# Patient Record
Sex: Male | Born: 1955
Health system: Southern US, Community
[De-identification: ages and names within clinical notes are randomized; demographics above are authoritative.]

## PROBLEM LIST (undated history)

## (undated) DIAGNOSIS — E785 Hyperlipidemia, unspecified: Secondary | ICD-10-CM

## (undated) DIAGNOSIS — F32A Depression, unspecified: Secondary | ICD-10-CM

## (undated) DIAGNOSIS — I10 Essential (primary) hypertension: Secondary | ICD-10-CM

## (undated) DIAGNOSIS — F419 Anxiety disorder, unspecified: Secondary | ICD-10-CM

## (undated) DIAGNOSIS — I714 Abdominal aortic aneurysm, without rupture, unspecified: Secondary | ICD-10-CM

## (undated) DIAGNOSIS — I491 Atrial premature depolarization: Secondary | ICD-10-CM

## (undated) DIAGNOSIS — I639 Cerebral infarction, unspecified: Secondary | ICD-10-CM

## (undated) DIAGNOSIS — R3911 Hesitancy of micturition: Secondary | ICD-10-CM

## (undated) DIAGNOSIS — I700 Atherosclerosis of aorta: Secondary | ICD-10-CM

## (undated) DIAGNOSIS — F329 Major depressive disorder, single episode, unspecified: Secondary | ICD-10-CM

## (undated) DIAGNOSIS — R0600 Dyspnea, unspecified: Secondary | ICD-10-CM

## (undated) DIAGNOSIS — R7309 Other abnormal glucose: Secondary | ICD-10-CM

## (undated) HISTORY — DX: Hyperlipidemia, unspecified: E78.5

## (undated) HISTORY — DX: Other abnormal glucose: R73.09

## (undated) HISTORY — DX: Atherosclerosis of aorta: I70.0

## (undated) HISTORY — DX: Major depressive disorder, single episode, unspecified: F32.9

## (undated) HISTORY — DX: Atrial premature depolarization: I49.1

## (undated) HISTORY — DX: Anxiety disorder, unspecified: F41.9

## (undated) HISTORY — DX: Abdominal aortic aneurysm, without rupture, unspecified: I71.40

## (undated) HISTORY — DX: Depression, unspecified: F32.A

## (undated) HISTORY — DX: Cerebral infarction, unspecified: I63.9

## (undated) HISTORY — DX: Hesitancy of micturition: R39.11

## (undated) HISTORY — DX: Essential (primary) hypertension: I10

## (undated) HISTORY — PX: COLON RESECTION: SHX5231

---

## 2010-01-12 ENCOUNTER — Inpatient Hospital Stay (HOSPITAL_COMMUNITY): Admission: EM | Admit: 2010-01-12 | Discharge: 2010-01-16 | Payer: Self-pay | Admitting: Emergency Medicine

## 2010-07-02 HISTORY — PX: COLON RESECTION: SHX5231

## 2010-07-05 ENCOUNTER — Emergency Department (HOSPITAL_COMMUNITY)
Admission: EM | Admit: 2010-07-05 | Discharge: 2010-07-05 | Payer: Self-pay | Source: Home / Self Care | Admitting: Emergency Medicine

## 2010-07-05 LAB — CBC
HCT: 43.4 % (ref 39.0–52.0)
Hemoglobin: 14.5 g/dL (ref 13.0–17.0)
MCH: 29.7 pg (ref 26.0–34.0)
MCHC: 33.4 g/dL (ref 30.0–36.0)
MCV: 88.9 fL (ref 78.0–100.0)
Platelets: 333 10*3/uL (ref 150–400)
RBC: 4.88 MIL/uL (ref 4.22–5.81)
RDW: 14.2 % (ref 11.5–15.5)
WBC: 9.1 10*3/uL (ref 4.0–10.5)

## 2010-07-05 LAB — COMPREHENSIVE METABOLIC PANEL
ALT: 16 U/L (ref 0–53)
AST: 17 U/L (ref 0–37)
Albumin: 3.8 g/dL (ref 3.5–5.2)
Alkaline Phosphatase: 79 U/L (ref 39–117)
BUN: 8 mg/dL (ref 6–23)
CO2: 24 mEq/L (ref 19–32)
Calcium: 9.6 mg/dL (ref 8.4–10.5)
Chloride: 105 mEq/L (ref 96–112)
Creatinine, Ser: 0.94 mg/dL (ref 0.4–1.5)
GFR calc Af Amer: >60 mL/min (ref >60)
GFR calc non Af Amer: >60 mL/min (ref >60)
Glucose, Bld: 98 mg/dL (ref 70–99)
Potassium: 4.2 mEq/L (ref 3.5–5.1)
Sodium: 139 mEq/L (ref 135–145)
Total Bilirubin: 0.9 mg/dL (ref 0.3–1.2)
Total Protein: 7.2 g/dL (ref 6.0–8.3)

## 2010-07-05 LAB — DIFFERENTIAL
Basophils Absolute: 0 10*3/uL (ref 0.0–0.1)
Basophils Relative: 0 % (ref 0–1)
Eosinophils Absolute: 0.1 10*3/uL (ref 0.0–0.7)
Eosinophils Relative: 1 % (ref 0–5)
Lymphocytes Relative: 18 % (ref 12–46)
Lymphs Abs: 1.6 10*3/uL (ref 0.7–4.0)
Monocytes Absolute: 0.4 10*3/uL (ref 0.1–1.0)
Monocytes Relative: 4 % (ref 3–12)
Neutro Abs: 7 10*3/uL (ref 1.7–7.7)
Neutrophils Relative %: 77 % (ref 43–77)

## 2010-07-05 LAB — URINALYSIS, ROUTINE W REFLEX MICROSCOPIC
Hemoglobin, Urine: NEGATIVE
Ketones, ur: 15 mg/dL — AB
Nitrite: NEGATIVE
Protein, ur: NEGATIVE mg/dL
Specific Gravity, Urine: 1.023 (ref 1.005–1.030)
Urine Glucose, Fasting: NEGATIVE mg/dL
Urobilinogen, UA: 0.2 mg/dL (ref 0.0–1.0)
pH: 5.5 (ref 5.0–8.0)

## 2010-07-06 ENCOUNTER — Inpatient Hospital Stay (HOSPITAL_COMMUNITY): Admission: AD | Admit: 2010-07-06 | Discharge: 2010-07-16 | Payer: Self-pay | Source: Home / Self Care

## 2010-07-07 LAB — BASIC METABOLIC PANEL
BUN: 9 mg/dL (ref 6–23)
CO2: 25 mEq/L (ref 19–32)
Calcium: 9 mg/dL (ref 8.4–10.5)
Chloride: 105 mEq/L (ref 96–112)
Creatinine, Ser: 1 mg/dL (ref 0.4–1.5)
GFR calc Af Amer: >60 mL/min (ref >60)
GFR calc non Af Amer: >60 mL/min (ref >60)
Glucose, Bld: 121 mg/dL — ABNORMAL HIGH (ref 70–99)
Potassium: 3.8 mEq/L (ref 3.5–5.1)
Sodium: 137 mEq/L (ref 135–145)

## 2010-07-07 LAB — CBC
HCT: 37.5 % — ABNORMAL LOW (ref 39.0–52.0)
Hemoglobin: 12.4 g/dL — ABNORMAL LOW (ref 13.0–17.0)
MCH: 29.5 pg (ref 26.0–34.0)
MCHC: 33.1 g/dL (ref 30.0–36.0)
MCV: 89.3 fL (ref 78.0–100.0)
Platelets: 310 10*3/uL (ref 150–400)
RBC: 4.2 MIL/uL — ABNORMAL LOW (ref 4.22–5.81)
RDW: 14.3 % (ref 11.5–15.5)
WBC: 12.1 10*3/uL — ABNORMAL HIGH (ref 4.0–10.5)

## 2010-07-17 LAB — BASIC METABOLIC PANEL
BUN: 4 mg/dL — ABNORMAL LOW (ref 6–23)
CO2: 24 mEq/L (ref 19–32)
Calcium: 9.2 mg/dL (ref 8.4–10.5)
Chloride: 105 mEq/L (ref 96–112)
Creatinine, Ser: 0.88 mg/dL (ref 0.4–1.5)
GFR calc Af Amer: >60 mL/min (ref >60)
GFR calc non Af Amer: >60 mL/min (ref >60)
Glucose, Bld: 112 mg/dL — ABNORMAL HIGH (ref 70–99)
Potassium: 4.1 mEq/L (ref 3.5–5.1)
Sodium: 139 mEq/L (ref 135–145)

## 2010-07-17 LAB — CBC
HCT: 39.4 % (ref 39.0–52.0)
HCT: 35.4 % — ABNORMAL LOW (ref 39.0–52.0)
HCT: 39.3 % (ref 39.0–52.0)
Hemoglobin: 12.9 g/dL — ABNORMAL LOW (ref 13.0–17.0)
Hemoglobin: 11.6 g/dL — ABNORMAL LOW (ref 13.0–17.0)
Hemoglobin: 13 g/dL (ref 13.0–17.0)
MCH: 29.1 pg (ref 26.0–34.0)
MCH: 29.4 pg (ref 26.0–34.0)
MCH: 29.3 pg (ref 26.0–34.0)
MCHC: 32.7 g/dL (ref 30.0–36.0)
MCHC: 32.8 g/dL (ref 30.0–36.0)
MCHC: 33.1 g/dL (ref 30.0–36.0)
MCV: 88.9 fL (ref 78.0–100.0)
MCV: 89.8 fL (ref 78.0–100.0)
MCV: 88.5 fL (ref 78.0–100.0)
Platelets: 337 10*3/uL (ref 150–400)
Platelets: 326 10*3/uL (ref 150–400)
Platelets: 373 10*3/uL (ref 150–400)
RBC: 4.43 MIL/uL (ref 4.22–5.81)
RBC: 3.94 MIL/uL — ABNORMAL LOW (ref 4.22–5.81)
RBC: 4.44 MIL/uL (ref 4.22–5.81)
RDW: 14.3 % (ref 11.5–15.5)
RDW: 14.3 % (ref 11.5–15.5)
RDW: 14.3 % (ref 11.5–15.5)
WBC: 12.8 10*3/uL — ABNORMAL HIGH (ref 4.0–10.5)
WBC: 8.4 10*3/uL (ref 4.0–10.5)
WBC: 8.7 10*3/uL (ref 4.0–10.5)

## 2010-07-19 LAB — BODY FLUID CULTURE
Culture: NO GROWTH
Gram Stain: NONE SEEN

## 2010-07-24 LAB — ANAEROBIC CULTURE: Gram Stain: NONE SEEN

## 2010-08-23 ENCOUNTER — Ambulatory Visit (HOSPITAL_COMMUNITY)
Admission: RE | Admit: 2010-08-23 | Discharge: 2010-08-23 | Disposition: A | Discharge status: Home / Self Care | Payer: BC Managed Care – PPO | Source: Ambulatory Visit | Attending: Surgery | Admitting: Surgery

## 2010-08-23 ENCOUNTER — Encounter (HOSPITAL_COMMUNITY)
Admission: RE | Admit: 2010-08-23 | Discharge: 2010-08-23 | Disposition: A | Discharge status: Home / Self Care | Payer: BC Managed Care – PPO | Source: Ambulatory Visit | Attending: Surgery | Admitting: Surgery

## 2010-08-23 ENCOUNTER — Other Ambulatory Visit (HOSPITAL_COMMUNITY): Payer: Self-pay | Admitting: Surgery

## 2010-08-23 DIAGNOSIS — Z01810 Encounter for preprocedural cardiovascular examination: Secondary | ICD-10-CM | POA: Insufficient documentation

## 2010-08-23 DIAGNOSIS — K5730 Diverticulosis of large intestine without perforation or abscess without bleeding: Secondary | ICD-10-CM | POA: Insufficient documentation

## 2010-08-23 DIAGNOSIS — Z01812 Encounter for preprocedural laboratory examination: Secondary | ICD-10-CM | POA: Insufficient documentation

## 2010-08-23 DIAGNOSIS — Z01818 Encounter for other preprocedural examination: Secondary | ICD-10-CM | POA: Insufficient documentation

## 2010-08-23 LAB — SURGICAL PCR SCREEN
MRSA, PCR: NEGATIVE
Staphylococcus aureus: NEGATIVE

## 2010-08-23 LAB — COMPREHENSIVE METABOLIC PANEL
ALT: 26 U/L (ref 0–53)
AST: 21 U/L (ref 0–37)
Albumin: 3.8 g/dL (ref 3.5–5.2)
Alkaline Phosphatase: 67 U/L (ref 39–117)
BUN: 12 mg/dL (ref 6–23)
CO2: 24 mEq/L (ref 19–32)
Calcium: 9.7 mg/dL (ref 8.4–10.5)
Chloride: 108 mEq/L (ref 96–112)
Creatinine, Ser: 0.88 mg/dL (ref 0.4–1.5)
GFR calc Af Amer: >60 mL/min (ref >60)
GFR calc non Af Amer: >60 mL/min (ref >60)
Glucose, Bld: 87 mg/dL (ref 70–99)
Potassium: 4.5 mEq/L (ref 3.5–5.1)
Sodium: 140 mEq/L (ref 135–145)
Total Bilirubin: 0.6 mg/dL (ref 0.3–1.2)
Total Protein: 6.4 g/dL (ref 6.0–8.3)

## 2010-08-23 LAB — CBC
HCT: 42 % (ref 39.0–52.0)
Hemoglobin: 14.1 g/dL (ref 13.0–17.0)
MCH: 29.6 pg (ref 26.0–34.0)
MCHC: 33.6 g/dL (ref 30.0–36.0)
MCV: 88.2 fL (ref 78.0–100.0)
Platelets: 285 10*3/uL (ref 150–400)
RBC: 4.76 MIL/uL (ref 4.22–5.81)
RDW: 14.8 % (ref 11.5–15.5)
WBC: 9.1 10*3/uL (ref 4.0–10.5)

## 2010-08-23 LAB — DIFFERENTIAL
Basophils Absolute: 0.1 10*3/uL (ref 0.0–0.1)
Basophils Relative: 1 % (ref 0–1)
Eosinophils Absolute: 0.1 10*3/uL (ref 0.0–0.7)
Eosinophils Relative: 1 % (ref 0–5)
Lymphocytes Relative: 24 % (ref 12–46)
Lymphs Abs: 2.2 10*3/uL (ref 0.7–4.0)
Monocytes Absolute: 0.5 10*3/uL (ref 0.1–1.0)
Monocytes Relative: 5 % (ref 3–12)
Neutro Abs: 6.3 10*3/uL (ref 1.7–7.7)
Neutrophils Relative %: 69 % (ref 43–77)

## 2010-08-28 ENCOUNTER — Inpatient Hospital Stay (HOSPITAL_COMMUNITY)
Admission: RE | Admit: 2010-08-28 | Discharge: 2010-09-01 | DRG: 148 | Disposition: A | Discharge status: Home / Self Care | Payer: BC Managed Care – PPO | Source: Ambulatory Visit | Attending: Surgery | Admitting: Surgery

## 2010-08-28 ENCOUNTER — Other Ambulatory Visit: Payer: Self-pay | Admitting: Surgery

## 2010-08-28 DIAGNOSIS — K5732 Diverticulitis of large intestine without perforation or abscess without bleeding: Principal | ICD-10-CM | POA: Diagnosis present

## 2010-08-28 DIAGNOSIS — R51 Headache: Secondary | ICD-10-CM | POA: Diagnosis not present

## 2010-08-28 DIAGNOSIS — I10 Essential (primary) hypertension: Secondary | ICD-10-CM | POA: Diagnosis present

## 2010-08-28 DIAGNOSIS — J029 Acute pharyngitis, unspecified: Secondary | ICD-10-CM | POA: Diagnosis not present

## 2010-08-28 DIAGNOSIS — K560 Paralytic ileus: Secondary | ICD-10-CM | POA: Diagnosis not present

## 2010-08-28 DIAGNOSIS — H9190 Unspecified hearing loss, unspecified ear: Secondary | ICD-10-CM | POA: Diagnosis present

## 2010-08-28 DIAGNOSIS — F17200 Nicotine dependence, unspecified, uncomplicated: Secondary | ICD-10-CM | POA: Diagnosis present

## 2010-08-28 NOTE — Discharge Summary (Signed)
Lawrence Bennett, Lawrence Bennett              ACCOUNT NO.:  [id number]  MEDICAL RECORD NO.:  [id number]          PATIENT TYPE:  INP  LOCATION:  1524                         FACILITY:  [facility]  PHYSICIAN:  Sharlet Salina T. Maybelline Kolarik, M.D.DATE OF BIRTH:  1956-03-19  DATE OF ADMISSION:  07/06/2010 DATE OF DISCHARGE:  07/16/2010                              DISCHARGE SUMMARY   HISTORY OF PRESENT ILLNESS:  Mr. Carll is a 55 year old male who presents to emergency department at [facility] for evidence of recurrent sigmoid diverticulitis.  He previously had been seen by Dr. Ezzard Standing and had been admitted before for diverticulitis and treated with IV antibiotics.  He had an outpatient colonoscopy during his outpatient workup, which proved significant diverticular disease particularly of the left colon.  He had previously been on Cipro and Flagyl.  He complained of recurrent abdominal pain and was asked to come back to the emergency department for further evaluation.  His reevaluation in the emergency department prompted a CT which showed significant recurrent sigmoid diverticulitis with an adjacent loop of small bowel that appeared to be a little bit thickened possibly indicating some associated inflammatory changes.  There did not appear to be any abscess or perforation at present time.  The patient was seen by Dr. Zachery Dakins as well as Dr. Ezzard Standing and decision was made to admit the patient for inpatient management and possible surgical involvement.  SUMMARY OF HOSPITAL COURSE:  The patient was admitted on July 06, 2010.  He was admitted to the floor and placed on IV antibiotics and essentially placed at bowel rest.  The antibiotics were continued.  The patient did require Foley catheterization for some urinary retention secondary to the associated inflammatory changes.  He did make some progress at first showing evidence of bowel function by means of a large bowel movement.  He was started on sips of  liquids.  However, the following day had a significant amount of bilious emesis.  This was possibly thought to be due to some terazosin that the patient was placed on for the urinary retention and this was subsequently discontinued.  In any event, the patient's diet was backed off to sips of liquids.  Later that day, the patient did have approximately 4 bowel movements but again started having trouble voiding since he was off his terazosin. Therefore, decision was made to put the patient on Flomax to try to treat his urinary retention.  His ileus began to resolve and since he had no longer any nausea or vomiting, his diet was advanced from clear liquids to full liquids.  Eventually, the patient was advanced to a low- residue diet.  However, the patient had new onset of abdominal pain, particularly on the left side feeling of some fullness.  This prompted a repeat CT since the patient was just not making the progress we anticipated he would.  CT again showed sigmoid diverticulitis, however, this time appeared to show fluid collection in the deep pelvis possibly indicating development of an abscess.  Decision was made to have Interventional Radiology aspirator possibly leave a drain in this fluid collection.  On July 14, 2010, the patient was  taken for CT-guided aspiration of pelvic fluid collection.  Approximately, 45 cc were removed of some clear yellow serous fluid.  No purulence was seen and therefore drain was not left in place.  The patient felt significantly better within the first 24 hours after having the fluid collection drained.  His diet was again advanced back up to a low-residue diet and again he felt significantly better enough to be discharged home.  The patient felt this was appropriate and was eager to get back home.  DISCHARGE DIAGNOSES: 1. Recurrent sigmoid diverticulitis with associated inflammatory     changes without perforation. 2. Ileus secondary to recurrent  sigmoid diverticulitis - resolved. 3. Urinary retention - resolved on Flomax.  DISCHARGE MEDICATIONS:  Will include: 1. Cipro 500 mg twice daily. 2. Flagyl 500 mg 3 times daily. 3. Ibuprofen 200 mg 2-3 tablets q.8 h. as needed p.r.n. pain. 4. Percocet 1 tablet q. 4-6 hours as needed for severe pain. 5. Flomax 0.4 mg daily. 6. Quinapril 40 mg daily.  The patient is set up to follow up with Dr. Ezzard Standing in approximately 1-2 weeks.  He is to follow a low-residue diet.  The dietitian visited the patient prior to discharge and gave him instructions and recommendations for dietary options.  He can certainly call any sooner for any issues if he has any.     Brayton El, PA-C   ______________________________ Lorne Skeens. Kohlton Gilpatrick, M.D.    Corky Downs  D:  08/02/2010  T:  08/02/2010  Job:  [id number]  Electronically Signed by Brayton El  on 08/07/2010 01:33:19 PM Electronically Signed by Glenna Fellows M.D. on 08/28/2010 03:11:14 PM

## 2010-08-29 LAB — BASIC METABOLIC PANEL
BUN: 8 mg/dL (ref 6–23)
CO2: 25 mEq/L (ref 19–32)
Calcium: 8.8 mg/dL (ref 8.4–10.5)
Creatinine, Ser: 0.86 mg/dL (ref 0.4–1.5)
GFR calc non Af Amer: >60 mL/min (ref >60)
Glucose, Bld: 118 mg/dL — ABNORMAL HIGH (ref 70–99)

## 2010-08-29 LAB — CBC
HCT: 40.4 % (ref 39.0–52.0)
Hemoglobin: 13.2 g/dL (ref 13.0–17.0)
MCH: 29.1 pg (ref 26.0–34.0)
MCHC: 32.7 g/dL (ref 30.0–36.0)
MCV: 89.2 fL (ref 78.0–100.0)
Platelets: 311 10*3/uL (ref 150–400)
RBC: 4.53 MIL/uL (ref 4.22–5.81)
RDW: 14.9 % (ref 11.5–15.5)
WBC: 11.5 10*3/uL — ABNORMAL HIGH (ref 4.0–10.5)

## 2010-09-11 NOTE — Op Note (Signed)
Lawrence Bennett, Lawrence Bennett              ACCOUNT NO.:  [id number]  MEDICAL RECORD NO.:  [id number]           PATIENT TYPE:  I  LOCATION:  5148                         FACILITY:  [facility]  PHYSICIAN:  Lawrence Bennett, M.D.  DATE OF BIRTH:  09-30-1955  DATE OF PROCEDURE:  08/28/2010                              OPERATIVE REPORT   PREOPERATIVE DIAGNOSIS:  Recurrent diverticulitis of sigmoid colon.  POSTOPERATIVE DIAGNOSIS:  Recurrent diverticulitis of sigmoid colon.  PROCEDURE:  Laparoscopically assisted sigmoid colectomy with 29 EEA stapler, appendectomy.  SURGEON:  Lawrence Bales. Ezzard Standing, MD  FIRST ASSISTANT:  Lawrence Arms. Tsuei, MD  ANESTHESIA:  General endotracheal.  ESTIMATED BLOOD LOSS:  200 mL.  Drains left in were none, did leave Telfa wicks in.  INDICATIONS FOR PROCEDURE:  Lawrence Bennett is a 55 year old white male who is patient of Lawrence Bennett who has had several admissions to the hospital for recurrent diverticulitis.  Discussed with him at length about surgery versus continued medical observation, how he could be best served with having this sigmoid colon, which was injured by the diverticular disease resected.  He has undergone a previous colonoscopy by Lawrence Bennett, which was normal.  The indications, potential complications of colon resection were explained to the patient.  Potential complications include, but not limited to bleeding, infection, leakage of the bowel, laparoscopic part of this open, depending on how much inflammation he has, possibility of colostomy, or some other unexpected injury.  OPERATIVE NOTE:  The patient was placed in the lithotomy position, given a general endotracheal anesthetic.  A time-out was held and the surgical checklist run.  His abdomen was prepped with ChloraPrep, his perineum with Betadine. Again he was in lithotomy, a Foley catheter placed and NG tube in place. I accessed the abdominal cavity through a right upper quadrant,  5-mm OptiVu trocar was inserted to the abdominal cavity.  I carried out abdominal exploration, right and left lobes of the liver unremarkable.  The bowel back to see was unremarkable though he did have a fair amount adhesions down in his pelvis, which include several loops of small bowel and the colon itself kind of coiled on itself.  Three additional 5-mm trocar, one left lower quadrant, one right lower quadrant, one lower midline.  I went down to mobilize the small bowel and got it out of the pelvis.  He also had a sort of funny ape in the way sigmoid colon has got tethered cranial and this was also taken down. I used primarily either blunt dissection, scissors, harmonic scalpel to do this mobilization.  At the time of his further evaluation actually his primary diverticular disease was in the distal one-fourth of the sigmoid colon, it was again adequate enough in the sigmoid colon, but I did have to mobilize the left colon and splenic flexure, so I went on and converted to an open operation.  I made a midline lower abdominal incision about 8-9 cm in length enough to get my hand in place.  I used the [employer] wound retractor and opened the abdominal cavity.  I mobilized the sigmoid colon out of the pelvis,  I found the right and left ureter, which were well lateral to the dissection, and since this was not a cancer operation, I stayed midline and the mesentery of the sigmoid colon.  He had about an 8-10 cm segment of chronically-inflamed thickened bowel that I was able to get below.  A wash went down also to the peritoneal reflection.  Lawrence Bennett scrubbed and modified the patient and found this, he was able to get to about 15 cm with the scope and this is about where I got below the inflammation.  I thought I could do an EEA stapler.  I dried the sigmoid colon, took the mesentery down using a combination of the LigaSure, Bovie electrocautery, and 2-0 silk sutures and  again got down to right maybe 1 or 2 cm above the peritoneal reflection.  I used a blue load of the contour stapler, placed this across the distal sigmoid colon, closed it, and fired it.  I put a suture in the proximal end of the bowel of the sigmoid colon.  I then started to develop proximal bowel, actually it looked like I need to resect about another 10 cm of bowel, which I divided just with Kelly clamps and staple, the distal to the open site was proximal, I then used a whip stitch of 2-0 Prolene over the distal bowel.  I used a size of 25, 29, and could even kind of barely get in the 33, but I was almost comfortable with 29 EEA staple was deemed the most appropriate for him.  I placed the anvil and cinched this 2-0 Prolene down.  Lawrence Bennett came a second time, below passed the 29 EEA Ethicon stapler from the rectum. We passed the EEA peg immediately posterior to the staple line.  I then attached the proximal end of the bowel, Lawrence Bennett closed the EEA down until  the green mark was in place, and fired the EEA.  We then looked and had both good proximal and distal rings.  Since this was not an operation for malignancy, the rings were not kept.  He then insufflated air into the rectum, I clamped off the sigmoid colon, and flooded the pelvis with saline.  There was no leak noted. I then placed a single 2-0 silk suture on each corner of the EEA anastomosis.  We then irrigated the pelvis out with 2 L of saline.  Because the appendix was kind of just lying in the pelvis, I went on and did an appendectomy, took the mesentery down with a Harmonic, removed the LigaSure, ligated the base of the appendix with 2-0 Vicryl suture, placed a 2-0 pursestring and kept the appendix in place.  The case was sent as a separate specimen.  The bowel was really unremarkable.  I did bring the omentum down and we had an NG tube in place.  I closed the abdominal wall with two running #1 PDS  sutures, irrigated the wound, closed the skin with skin staples, and Telfa wick. I have left a single trocar in the left upper quadrant, which I reinsufflated the abdomen and looked at the belly, the midline wound, there was no bowel caught up in there and the bowel that I could see was okay.  The scope then was removed.  The port was closed with skin staples.  The patient tolerated the procedure well, was transported to the recovery room in good condition.  Sponge and needle counts were correct at the end of the case.  Lawrence Bennett, M.D., FACS   DHN/MEDQ  D:  08/28/2010  T:  08/29/2010  Job:  [id number]  cc:   Massie Maroon, MD Jordan Hawks Elnoria Howard, MD  Electronically Signed by Ovidio Kin M.D. on 09/11/2010 04:54:09 PM

## 2010-09-11 NOTE — Discharge Summary (Signed)
NAMEBRENTON, Lawrence Bennett              ACCOUNT NO.:  [id number]  MEDICAL RECORD NO.:  [id number]           PATIENT TYPE:  LOCATION:                                 FACILITY:  PHYSICIAN:  Sandria Bales. Ezzard Standing, M.D.  DATE OF BIRTH:  Dec 15, 1955  DATE OF ADMISSION: 28 Aug 2010 DATE OF DISCHARGE: 01 September 2010                              DISCHARGE SUMMARY  DISCHARGE DIAGNOSES: 1. Recurrent sigmoid colon diverticulitis. 2. Hypertension. 3. Smokes cigarettes. 4. Decreased hearing.  OPERATION PERFORMED:  The patient underwent a laparoscopic-assisted sigmoid colectomy and appendectomy on August 28, 2010.  His date of admission was August 28, 2010, date of discharge will be September 01, 2010.  OPERATION PERFORMED:  Laparoscopic-assisted sigmoid colectomy and appendectomy on August 28, 2010.  HISTORY OF ILLNESS:  Lawrence Bennett is a 55 year old white male who sees Dr. Pearson Grippe as his primary medical doctor and has seen Dr. Jeani Hawking from a GI standpoint.  He has over the last year had recurrent bouts of diverticulitis, requiring a couple of hospitalizations.  I initially saw Lawrence Bennett in July 2011 when he was hospitalized for focal perforation at [facility].  He was treated medically, seemed to be doing well, but then was readmitted in January 2012 for again exacerbation of his diverticular disease.  He was seen by Dr. Jeani Hawking, had undergone a colonoscopy in October 2011.  Dr. Elnoria Howard I think saw evidence of diverticular disease but no mucosal lesions.  The patient comes on this admission for sigmoid colon resection to resect his area of recurrent diverticular disease.  His past medical history is significant that he has no allergies.  He is on medicines for hypertension.  He smokes cigarettes and knows it is bad for his health.  HOSPITAL COURSE:  On the day of admission, he was taken to the operating room where he underwent a laparoscopic-assisted sigmoid colectomy  and appendectomy.  Postoperatively, he did well.  On the first postop day, his hemoglobin was 13, white blood count of 11,500.  Serum electrolytes looked good. His Foley was removed.  His NG tube removed.  He did require an in and out catheterization a couple times in the hospital.  However, he is now 4 days postop, he is passing gas, had a bowel movement.  His diet will be advanced today.  If he does well with his diet, then he will go home.  If not, we will keep him at least one more day.  DISCHARGE INSTRUCTIONS:  He will resume his preop home medications which included citalopram 10 mg at bedtime, lorazepam 0.5 mg as needed, Bystolic 5 mg daily, and then quinapril 140 mg daily.   I will give him some Vicodin to go home with.  He should not drive for 3-5 days, will be out of work at least for now until September 26, 2010.  He is on a regular diet.  He is on check next week with our office for staple removal.   His final pathology showed evidence of diverticulitis of the sigmoid colon. No evidence of malignancy, and his appendix was normal.  His discharge  condition is good.   Sandria Bales. Ezzard Standing, M.D., FACS   DHN/MEDQ  D:  09/01/2010  T:  09/01/2010  Job:  [id number]  cc:   Massie Maroon, MD Jordan Hawks Elnoria Howard, MD  Electronically Signed by Ovidio Kin M.D. on 09/11/2010 09:17:38 PM

## 2010-09-16 LAB — BASIC METABOLIC PANEL
BUN: 18 mg/dL (ref 6–23)
CO2: 22 mEq/L (ref 19–32)
CO2: 23 mEq/L (ref 19–32)
Calcium: 8.6 mg/dL (ref 8.4–10.5)
Calcium: 8.8 mg/dL (ref 8.4–10.5)
Chloride: 108 mEq/L (ref 96–112)
Creatinine, Ser: 0.84 mg/dL (ref 0.4–1.5)
Creatinine, Ser: 0.9 mg/dL (ref 0.4–1.5)
GFR calc Af Amer: >60 mL/min (ref >60)
GFR calc Af Amer: >60 mL/min (ref >60)
GFR calc non Af Amer: >60 mL/min (ref >60)
GFR calc non Af Amer: >60 mL/min (ref >60)
Glucose, Bld: 83 mg/dL (ref 70–99)
Potassium: 3.7 mEq/L (ref 3.5–5.1)
Sodium: 135 mEq/L (ref 135–145)
Sodium: 137 mEq/L (ref 135–145)
Sodium: 139 mEq/L (ref 135–145)

## 2010-09-16 LAB — CBC
HCT: 32.5 % — ABNORMAL LOW (ref 39.0–52.0)
Hemoglobin: 10.9 g/dL — ABNORMAL LOW (ref 13.0–17.0)
Hemoglobin: 11.1 g/dL — ABNORMAL LOW (ref 13.0–17.0)
Hemoglobin: 11.2 g/dL — ABNORMAL LOW (ref 13.0–17.0)
MCH: 30.9 pg (ref 26.0–34.0)
MCH: 31.1 pg (ref 26.0–34.0)
MCHC: 34 g/dL (ref 30.0–36.0)
MCV: 90.4 fL (ref 78.0–100.0)
MCV: 90.8 fL (ref 78.0–100.0)
Platelets: 248 10*3/uL (ref 150–400)
Platelets: 264 10*3/uL (ref 150–400)
Platelets: 277 10*3/uL (ref 150–400)
RBC: 3.51 MIL/uL — ABNORMAL LOW (ref 4.22–5.81)
RBC: 3.6 MIL/uL — ABNORMAL LOW (ref 4.22–5.81)
RBC: 3.65 MIL/uL — ABNORMAL LOW (ref 4.22–5.81)
RDW: 14.3 % (ref 11.5–15.5)
RDW: 14.7 % (ref 11.5–15.5)
WBC: 6.3 10*3/uL (ref 4.0–10.5)
WBC: 6.5 10*3/uL (ref 4.0–10.5)
WBC: 7.4 10*3/uL (ref 4.0–10.5)

## 2010-09-16 LAB — DIFFERENTIAL
Basophils Absolute: 0 10*3/uL (ref 0.0–0.1)
Eosinophils Absolute: 0.1 10*3/uL (ref 0.0–0.7)
Eosinophils Relative: 2 % (ref 0–5)
Lymphs Abs: 1 10*3/uL (ref 0.7–4.0)
Neutrophils Relative %: 76 % (ref 43–77)

## 2010-09-17 LAB — COMPREHENSIVE METABOLIC PANEL
ALT: 25 U/L (ref 0–53)
Albumin: 3.1 g/dL — ABNORMAL LOW (ref 3.5–5.2)
Alkaline Phosphatase: 70 U/L (ref 39–117)
BUN: 14 mg/dL (ref 6–23)
Chloride: 103 mEq/L (ref 96–112)
Potassium: 4 mEq/L (ref 3.5–5.1)
Total Bilirubin: 0.8 mg/dL (ref 0.3–1.2)

## 2010-09-17 LAB — CBC
HCT: 39.3 % (ref 39.0–52.0)
MCH: 31.4 pg (ref 26.0–34.0)
MCHC: 35 g/dL (ref 30.0–36.0)
MCV: 89.8 fL (ref 78.0–100.0)
Platelets: 243 10*3/uL (ref 150–400)
Platelets: 245 10*3/uL (ref 150–400)
RBC: 4.37 MIL/uL (ref 4.22–5.81)
RBC: 4.54 MIL/uL (ref 4.22–5.81)
WBC: 19.4 10*3/uL — ABNORMAL HIGH (ref 4.0–10.5)

## 2010-09-17 LAB — URINE MICROSCOPIC-ADD ON

## 2010-09-17 LAB — URINALYSIS, ROUTINE W REFLEX MICROSCOPIC
Bilirubin Urine: NEGATIVE
Glucose, UA: NEGATIVE mg/dL
Nitrite: NEGATIVE
Specific Gravity, Urine: 1.02 (ref 1.005–1.030)
pH: 5 (ref 5.0–8.0)

## 2010-09-17 LAB — BASIC METABOLIC PANEL
CO2: 26 mEq/L (ref 19–32)
Calcium: 9 mg/dL (ref 8.4–10.5)
Creatinine, Ser: 1.14 mg/dL (ref 0.4–1.5)
GFR calc Af Amer: >60 mL/min (ref >60)
Glucose, Bld: 114 mg/dL — ABNORMAL HIGH (ref 70–99)

## 2010-09-17 LAB — CULTURE, BLOOD (ROUTINE X 2)

## 2010-09-17 LAB — DIFFERENTIAL
Basophils Absolute: 0.4 10*3/uL — ABNORMAL HIGH (ref 0.0–0.1)
Eosinophils Absolute: 0 10*3/uL (ref 0.0–0.7)
Monocytes Absolute: 0.6 10*3/uL (ref 0.1–1.0)
Neutrophils Relative %: 90 % — ABNORMAL HIGH (ref 43–77)

## 2010-09-17 LAB — HEPATIC FUNCTION PANEL
Bilirubin, Direct: 0.2 mg/dL (ref 0.0–0.3)
Indirect Bilirubin: 1 mg/dL — ABNORMAL HIGH (ref 0.3–0.9)

## 2011-07-22 IMAGING — CR DG CHEST 2V
2 series · 2 of 2 positions shown · non-contrast
Comparison: None

CLINICAL DATA: Preop diverticulitis

CHEST - 2 VIEW

[view not recorded (1 of 2)]
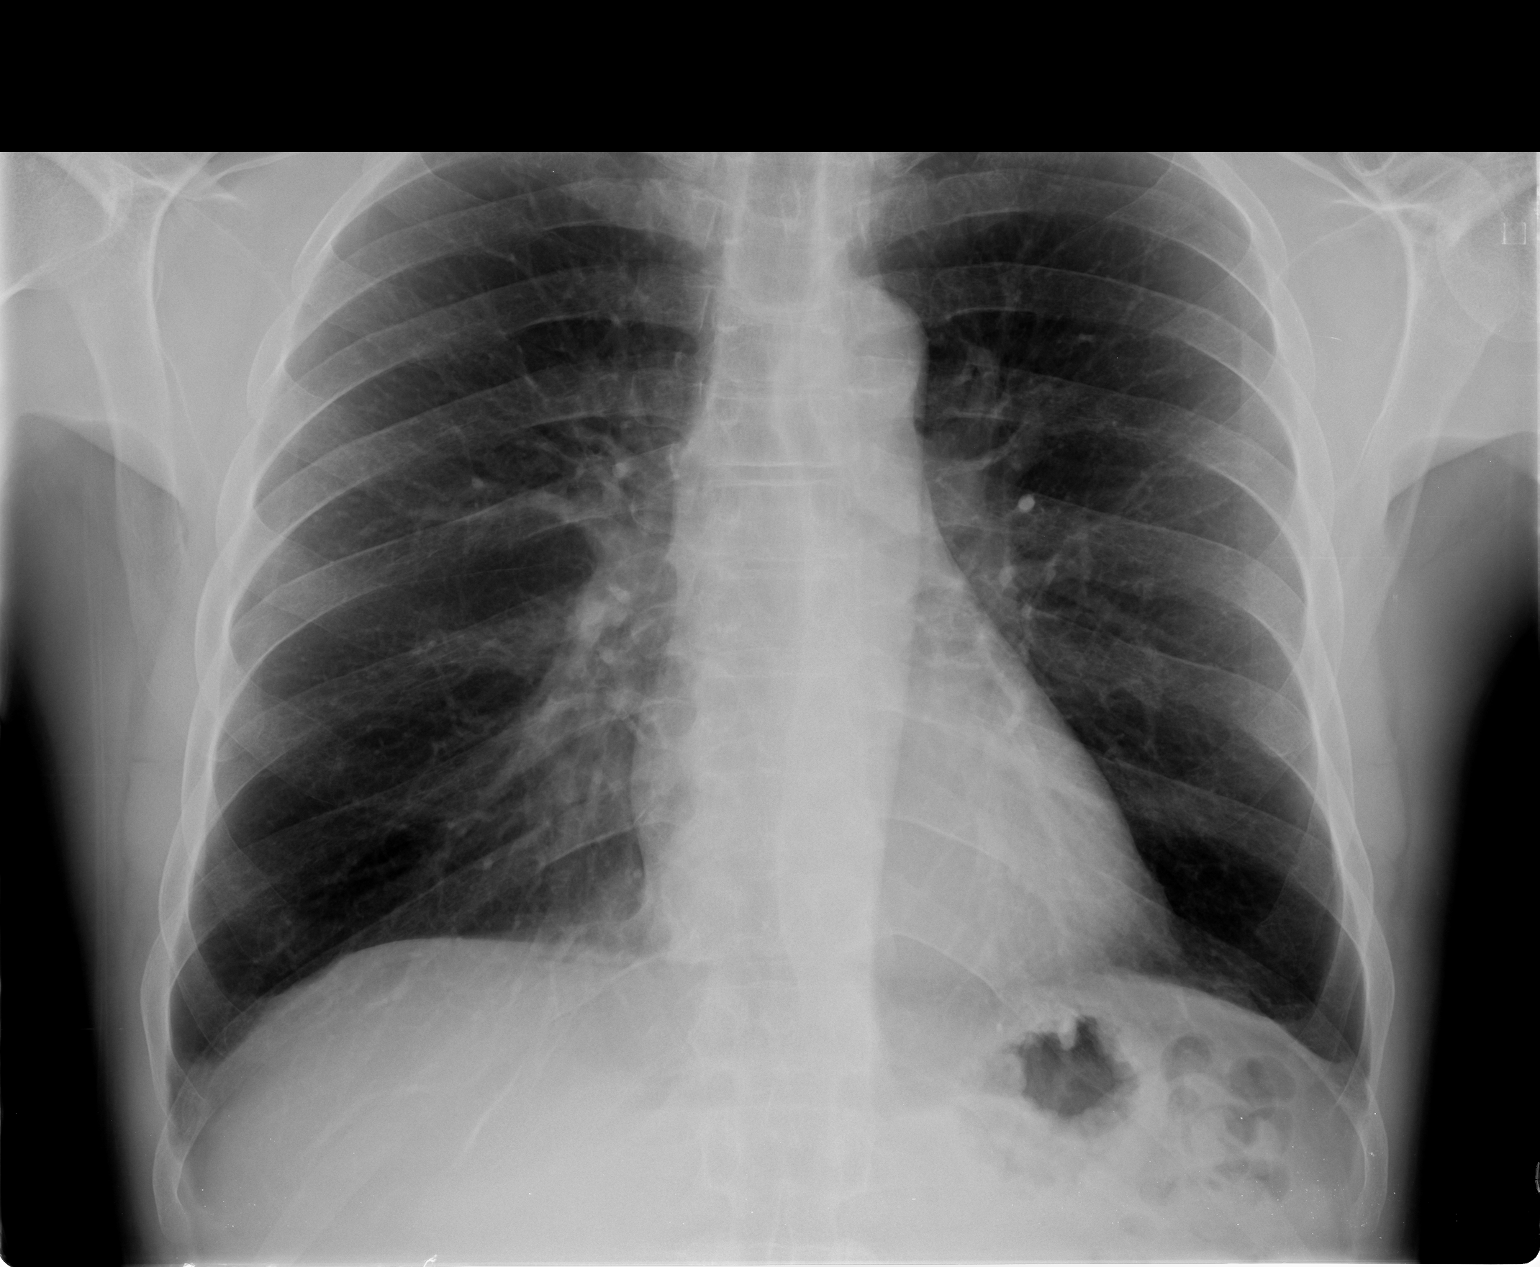

[view not recorded (2 of 2)]
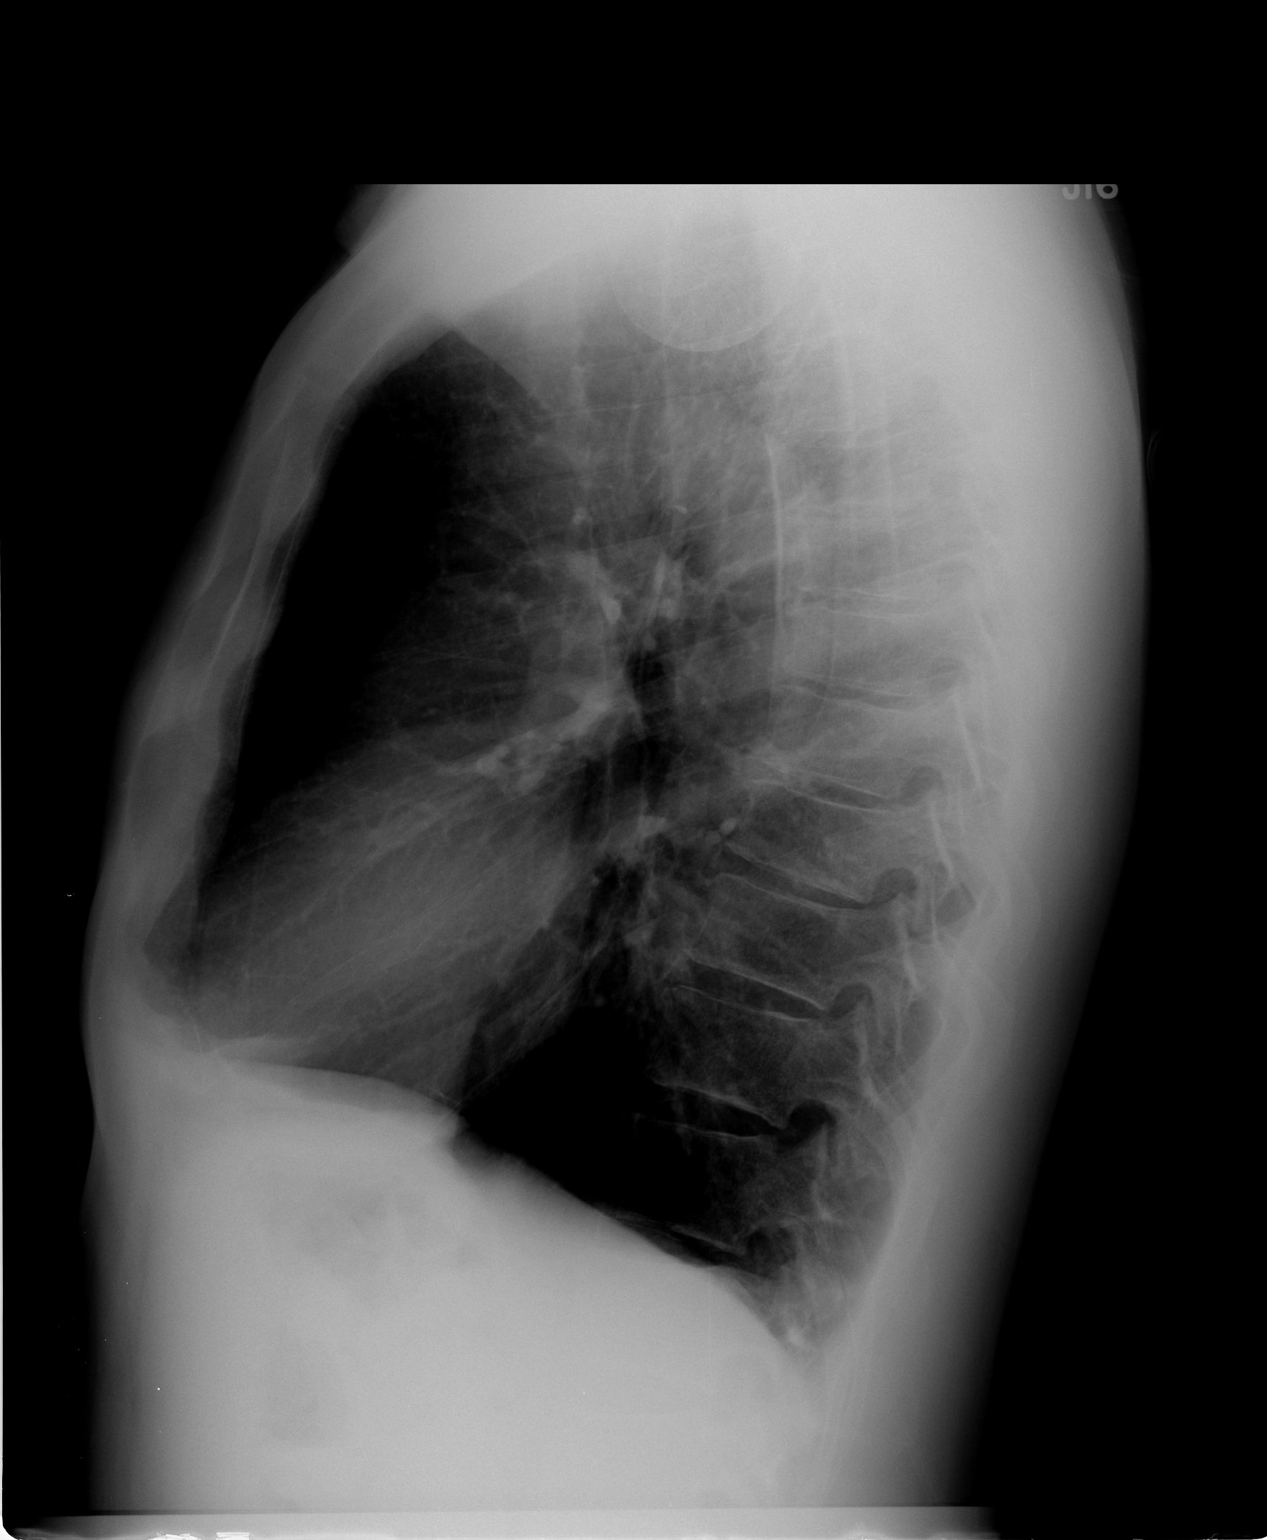

[2 of 2 positions shown; findings below may reference images not displayed]

FINDINGS: Pulmonary hyperinflation with changes of COPD.  Negative
for infiltrate or mass lesion.  Negative for heart failure or
effusion.
IMPRESSION: COPD.  No acute cardiopulmonary disease.

## 2013-12-16 ENCOUNTER — Encounter (INDEPENDENT_AMBULATORY_CARE_PROVIDER_SITE_OTHER): Payer: Self-pay | Admitting: Ophthalmology

## 2014-03-03 ENCOUNTER — Other Ambulatory Visit: Payer: Self-pay | Admitting: Internal Medicine

## 2014-03-03 DIAGNOSIS — E785 Hyperlipidemia, unspecified: Secondary | ICD-10-CM

## 2014-03-04 ENCOUNTER — Telehealth (HOSPITAL_COMMUNITY): Payer: Self-pay | Admitting: *Deleted

## 2014-03-04 ENCOUNTER — Other Ambulatory Visit: Payer: Self-pay | Admitting: Internal Medicine

## 2014-03-04 DIAGNOSIS — E785 Hyperlipidemia, unspecified: Secondary | ICD-10-CM

## 2014-03-11 ENCOUNTER — Ambulatory Visit
Admission: RE | Admit: 2014-03-11 | Discharge: 2014-03-11 | Disposition: A | Discharge status: Home / Self Care | Payer: BC Managed Care – PPO | Source: Ambulatory Visit | Attending: Internal Medicine | Admitting: Internal Medicine

## 2014-03-11 ENCOUNTER — Other Ambulatory Visit: Payer: Self-pay | Admitting: Internal Medicine

## 2014-03-11 DIAGNOSIS — E785 Hyperlipidemia, unspecified: Secondary | ICD-10-CM

## 2014-04-08 ENCOUNTER — Ambulatory Visit: Payer: BC Managed Care – PPO | Admitting: Internal Medicine

## 2014-04-15 ENCOUNTER — Other Ambulatory Visit: Payer: Self-pay | Admitting: *Deleted

## 2014-04-15 ENCOUNTER — Encounter: Payer: Self-pay | Admitting: Internal Medicine

## 2014-04-15 ENCOUNTER — Ambulatory Visit (INDEPENDENT_AMBULATORY_CARE_PROVIDER_SITE_OTHER): Payer: BC Managed Care – PPO | Admitting: Internal Medicine

## 2014-04-15 VITALS — BP 138/90 | HR 76 | Ht 71.0 in | Wt 169.1 lb

## 2014-04-15 DIAGNOSIS — R42 Dizziness and giddiness: Secondary | ICD-10-CM | POA: Insufficient documentation

## 2014-04-15 DIAGNOSIS — I10 Essential (primary) hypertension: Secondary | ICD-10-CM

## 2014-04-15 DIAGNOSIS — F419 Anxiety disorder, unspecified: Secondary | ICD-10-CM | POA: Insufficient documentation

## 2014-04-15 DIAGNOSIS — H9190 Unspecified hearing loss, unspecified ear: Secondary | ICD-10-CM | POA: Insufficient documentation

## 2014-04-15 MED ORDER — PROPRANOLOL HCL ER 60 MG PO CP24: 60.0000 mg | ORAL_CAPSULE | Freq: Every day | ORAL | Status: DC

## 2014-04-15 NOTE — Patient Instructions (Addendum)
Your physician has recommended you make the following change in your medication...  1. STOP amlodipine 2.5mg   2. START propranolol LA 60mg  once daily   Please schedule a blood pressure check appointment with Erasmo Downer (pharmacist) in 2 weeks  You have been referred to Dr. Benjamine Mola (ENT)  Your physician wants you to follow-up in: 1 year with Dr. Debara Pickett. You will receive a reminder letter in the mail two months in advance. If you don't receive a letter, please call our office to schedule the follow-up appointment.

## 2014-04-15 NOTE — Progress Notes (Signed)
OFFICE NOTE  Chief Complaint:  Dizziness, labile blood pressure, anxiety  Primary Care Physician: Jani Gravel, MD  HPI:  Lawrence Bennett  is a 58 year old gentleman who I saw 2 years ago for irregular heartbeat - which was related to PVC's. He also had notable anxiety recently he has been hearing some clicking in his years and had some changes on my exam which was concerning for possible thromboembolic event. He underwent carotid Dopplers which show a trivial amount of carotid plaque. He also had a repeat aortic ultrasound which shows mild enlargement of a small infrarenal aneurysm which will need to be followed. He has been a smoker but is cutting back. He works as a Dealer. Recently he's been having problems with dizziness and noted that his blood pressure is somewhat labile. He has been adjusting his own medications. He was recently started on Benicar however this was too expensive. He was reportedly taking Norvasc 10 mg however he says he's only taking 2.5 mg. He is also on losartan. He is also reported progressive hearing loss which may be related to his dizziness.  PMHx:  Past Medical History  Diagnosis Date  . Dyslipidemia   . Mild hypertension   . Anxiety   . Atrial contractions, premature   . Atherosclerosis of aorta     History reviewed. No pertinent past surgical history.  FAMHx:  Family History  Problem Relation Age of Onset  . Stroke Father     SOCHx:   reports that he has been smoking Cigarettes.  He has a 3.8 pack-year smoking history. He has never used smokeless tobacco. He reports that he does not drink alcohol or use illicit drugs.  ALLERGIES:  No Known Allergies  ROS: A comprehensive review of systems was negative except for: Constitutional: positive for fatigue Ears, nose, mouth, throat, and face: positive for hearing loss Neurological: positive for dizziness Behavioral/Psych: positive for anxiety  HOME MEDS: Current Outpatient Prescriptions    Medication Sig Dispense Refill  . aspirin 325 MG EC tablet Take 325 mg by mouth daily.      Marland Kitchen ibuprofen (ADVIL,MOTRIN) 200 MG tablet Take 400 mg by mouth at bedtime.       Marland Kitchen LORazepam (ATIVAN) 0.5 MG tablet Take 0.5 mg by mouth 2 (two) times daily.       Marland Kitchen losartan (COZAAR) 50 MG tablet Take 100 mg by mouth daily.      . propranolol ER (INDERAL LA) 60 MG 24 hr capsule Take 1 capsule (60 mg total) by mouth daily.  30 capsule  11   No current facility-administered medications for this visit.    LABS/IMAGING: No results found for this or any previous visit (from the past 48 hour(s)). No results found.  VITALS: BP 138/90  Pulse 76  Ht 5\' 11"  (1.803 m)  Wt 169 lb 1.6 oz (76.703 kg)  BMI 23.60 kg/m2  EXAM: General appearance: alert and no distress Neck: no carotid bruit and no JVD Lungs: clear to auscultation bilaterally Heart: regular rate and rhythm, S1, S2 normal, no murmur, click, rub or gallop Abdomen: soft, non-tender; bowel sounds normal; no masses,  no organomegaly Extremities: extremities normal, atraumatic, no cyanosis or edema Pulses: 2+ and symmetric Skin: Skin color, texture, turgor normal. No rashes or lesions Neurologic: Grossly normal PSych: Anxious  EKG: Sinus rhythm at 76  ASSESSMENT: 1. Anxiety 2. Labile hypertension 3. Dizziness 4. Hearing loss  PLAN: 1.   Lawrence Bennett has labile hypertension possibly related to anxiety.  He has been adjusting his own medications so it is difficult to know what he really takes. He is interested in trying a different medication at this time and I will discontinue his amlodipine. He should stay on losartan 100 mg daily and we will add low-dose propranolol 60 mg XR daily. This should help some with anxiety as well as blood pressure lowering. He continues to have problems with dizziness which I do not feel is related to blood pressure. He also has significant hearing loss and I recommend, and will refer him to see an ear nose and  throat physician for evaluation of his hearing loss and possible related dizziness.  We will schedule followup with Lawrence Bennett, our pharmacist for blood pressure check in 2 weeks. If he remains stable he can followup with me annually.  Thank you for referring him.  [name], MD, [facility] Attending Cardiologist [facility]  Renata Gambino C 04/15/2014, 11:58 AM

## 2014-04-20 ENCOUNTER — Telehealth: Payer: Self-pay | Admitting: Internal Medicine

## 2014-04-20 NOTE — Telephone Encounter (Signed)
lmsg for patient to call - he has appt with dr. Benjamine Mola on October 27 at 4:00.

## 2014-04-22 ENCOUNTER — Telehealth: Payer: Self-pay | Admitting: Internal Medicine

## 2014-04-22 NOTE — Telephone Encounter (Signed)
Patient returned my call regarding appt with Dr. Benjamine Mola and he asked that i cancel appt until he gets his blood pressure checked again.Marland KitchenMarland KitchenMarland Kitchen

## 2014-04-28 ENCOUNTER — Telehealth: Payer: Self-pay | Admitting: *Deleted

## 2014-04-28 NOTE — Telephone Encounter (Signed)
Thanks for letting me know.  Dr. H 

## 2014-04-28 NOTE — Telephone Encounter (Signed)
Patient walked in requesting a BP check - was scheduled for a BP check with Erasmo Downer on 10/30 and patient canceled appointment 10/27. Dr. Debara Pickett had stopped CCB and started on B-Blocker at last OV and he wanted patient to f/up with Erasmo Downer. He reports he is the one who mentioned to Dr. Debara Pickett wanting to start a B-Blocker and now reports he wants to come off of this.   I offered to check patient's BP, but he was encouraged to reschedule appointment, which he agreed to, and he is now to see Erasmo Downer for a BP check 10/30 @ 2:10pm. Patient was advised to bring his home BP cuff with him, as he reports his home BPs are around 160s/100s. He states he is down to 160lbs from 178lbs, but was not weighed in office today.   BP sitting: 152/98 HR 68 Patient describes a tension headache and that he sometimes feels lightheaded. He asked if something else in his body could be causing his high blood pressure and asked who could do labs for him (advised him his PCP would monitor complete labs - and he states he has an appointment with Dr. Maudie Mercury next Friday)  Will route to Dr. Debara Pickett and Erasmo Downer

## 2014-04-30 ENCOUNTER — Ambulatory Visit: Payer: BC Managed Care – PPO | Admitting: Pharmacist Clinician (PhC)/ Clinical Pharmacy Specialist

## 2014-04-30 ENCOUNTER — Ambulatory Visit (INDEPENDENT_AMBULATORY_CARE_PROVIDER_SITE_OTHER): Payer: BC Managed Care – PPO | Admitting: Pharmacist Clinician (PhC)/ Clinical Pharmacy Specialist

## 2014-04-30 VITALS — BP 156/90 | HR 64 | Ht 71.0 in | Wt 169.1 lb

## 2014-04-30 DIAGNOSIS — I10 Essential (primary) hypertension: Secondary | ICD-10-CM

## 2014-04-30 LAB — COMPREHENSIVE METABOLIC PANEL
ALT: 20 U/L (ref 0–53)
AST: 16 U/L (ref 0–37)
Albumin: 3.9 g/dL (ref 3.5–5.2)
Alkaline Phosphatase: 74 U/L (ref 39–117)
BUN: 16 mg/dL (ref 6–23)
CALCIUM: 9 mg/dL (ref 8.4–10.5)
CHLORIDE: 106 meq/L (ref 96–112)
CO2: 22 meq/L (ref 19–32)
CREATININE: 0.81 mg/dL (ref 0.50–1.35)
Glucose, Bld: 93 mg/dL (ref 70–99)
Potassium: 4.4 mEq/L (ref 3.5–5.3)
Sodium: 139 mEq/L (ref 135–145)
TOTAL PROTEIN: 6.2 g/dL (ref 6.0–8.3)
Total Bilirubin: 0.4 mg/dL (ref 0.2–1.2)

## 2014-04-30 NOTE — Patient Instructions (Signed)
Return for a a follow up appointment in 3 weeks   Your blood pressure today is 156/90  (goal <140/90)  Check your blood pressure at home daily and keep record of the readings.  Take your BP meds as follows: continue with losartan 100mg  each morning.  Stop propranolol and start amlodipine 2.5mg  each evening  Bring all of  your BP cuff and your record of home blood pressures to your next appointment.  Exercise as you're able, try to walk approximately 30 minutes per day.  Keep salt intake to a minimum, especially watch canned and prepared boxed foods.  Eat more fresh fruits and vegetables and fewer canned items.  Avoid eating in fast food restaurants.    HOW TO TAKE YOUR BLOOD PRESSURE:   Rest 5 minutes before taking your blood pressure.    Don't smoke or drink caffeinated beverages for at least 30 minutes before.   Take your blood pressure before (not after) you eat.   Sit comfortably with your back supported and both feet on the floor (don't cross your legs).   Elevate your arm to heart level on a table or a desk.   Use the proper sized cuff. It should fit smoothly and snugly around your bare upper arm. There should be enough room to slip a fingertip under the cuff. The bottom edge of the cuff should be 1 inch above the crease of the elbow.   Ideally, take 3 measurements at one sitting and record the average.

## 2014-05-01 ENCOUNTER — Encounter: Payer: Self-pay | Admitting: Pharmacist Clinician (PhC)/ Clinical Pharmacy Specialist

## 2014-05-01 NOTE — Progress Notes (Signed)
05/01/2014 TREYSHAWN Bennett 1956-01-18 [id number]   HPI:  Lawrence Bennett is a 58 y.o. male patient of Dr. Debara Pickett, with a PMH below who presents today for hypertension clinic evaluation.  He originally saw Dr. Debara Pickett for PVC's about 2 years ago, and recently returned because of problems with labile hypertension.  He has been taking losartan 50mg  - 2 tablets daily and propranolol xl 60mg  once daily.  Previously he had been given Benicar 40mg , but he states that he only took 1 dose of sample and it "was way too strong", made him dizzy and hypotensive, so he went back on losartan.  He was then given amlodipine, apparently 10mg , but states he only took 2.5 mg daily.  This combination was keeping his BP in the 409W systolic according to the patient.  When he switched to the propranolol his home pressures went up as high as 200/110.  He is convinced that he is overly sensitive to medications and thinks his best option it to take lisinopril, as that is what his sister takes and it "works for her".    His family history of cardiovascular disease is significant only in that his father died from a stroke and his sister is also hypertensive.    Mr. Record does not drink alcohol, but does drink multiple caffeinated sodas per day.  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   He states that he and his wife try to follow a low sodium diet, however he stops for biscuit most days on his way to work.    He has a home blood pressure cuff, which he was asked to bring to this appointment, however he forgot it.    Current Outpatient Prescriptions  Medication Sig Dispense Refill  . aspirin 325 MG EC tablet Take 325 mg by mouth daily.      Marland Kitchen ibuprofen (ADVIL,MOTRIN) 200 MG tablet Take 400 mg by mouth at bedtime.       Marland Kitchen LORazepam (ATIVAN) 0.5 MG tablet Take 0.5 mg by mouth 2 (two) times daily.       Marland Kitchen losartan  (COZAAR) 50 MG tablet Take 100 mg by mouth daily.      . propranolol ER (INDERAL LA) 60 MG 24 hr capsule Take 1 capsule (60 mg total) by mouth daily.  30 capsule  11   No current facility-administered medications for this visit.    No Known Allergies  Past Medical History  Diagnosis Date  . Dyslipidemia   . Mild hypertension   . Anxiety   . Atrial contractions, premature   . Atherosclerosis of aorta     Blood pressure 156/90, pulse 64, height 5\' 11"  (1.803 m), weight 169 lb 1.6 oz (76.703 kg).  Standing 160/96   ASSESSMENT AND PLAN:  Today his blood pressure is still elevated above the JNC-8 goal of <140/90.  He is as much concerned about his recurrent dizziness and potential lung problems as he is with his blood pressure.  He believes he is lacking oxygen and asked if we could run tests to see if he had COPD.  I explained that our job is to get his blood pressure under control, he would need to see his primary MD about the other concerns.   We also spoke at length about his perceived sensitivities to medication.  I explained that we often will start a medication at  a lower dose and increase gradually to avoid side effects.  Because a medication doesn't drop his BP right away doesn't mean it's a failure, we just might not have the right dose yet.  He wanted to stop the beta-blocker and wanted to start lisinopril today.  I suggested that we first put him back on amlodipine 2.5mg , as that with losartan seemed to be working.  He will continue to take the losartan 100mg  each morning and add amlodipine in the evenings.  I will see him back in 3 weeks for a repeat BP check.  I stressed that he needs to write down his home BP readings and bring his cuff with him, so that we can be sure of the accuracy of his home readings.  He also had worries about the losartan having potassium and was concerned that he might be getting "too much".  We don't have any labs on file, so I will have a CMET drawn today to  verify kidney and liver function.  Tommy Medal PharmD CPP [facility]

## 2014-05-02 ENCOUNTER — Ambulatory Visit (INDEPENDENT_AMBULATORY_CARE_PROVIDER_SITE_OTHER): Payer: BC Managed Care – PPO | Admitting: Emergency Medicine

## 2014-05-02 VITALS — BP 168/106 | HR 70 | Temp 98.4°F | Resp 16 | Ht 71.0 in | Wt 165.8 lb

## 2014-05-02 DIAGNOSIS — R42 Dizziness and giddiness: Secondary | ICD-10-CM

## 2014-05-02 DIAGNOSIS — K5730 Diverticulosis of large intestine without perforation or abscess without bleeding: Secondary | ICD-10-CM | POA: Insufficient documentation

## 2014-05-02 DIAGNOSIS — H9193 Unspecified hearing loss, bilateral: Secondary | ICD-10-CM

## 2014-05-02 DIAGNOSIS — R109 Unspecified abdominal pain: Secondary | ICD-10-CM

## 2014-05-02 DIAGNOSIS — R350 Frequency of micturition: Secondary | ICD-10-CM

## 2014-05-02 LAB — POCT URINALYSIS DIPSTICK
Bilirubin, UA: NEGATIVE
Glucose, UA: NEGATIVE
KETONES UA: NEGATIVE
Leukocytes, UA: NEGATIVE
Nitrite, UA: NEGATIVE
Protein, UA: NEGATIVE
Spec Grav, UA: <=1.005
Urobilinogen, UA: 0.2
pH, UA: 5.5

## 2014-05-02 LAB — POCT CBC
Granulocyte percent: 65.7 %G (ref 37–80)
HCT, POC: 47.3 % (ref 43.5–53.7)
HEMOGLOBIN: 15.7 g/dL (ref 14.1–18.1)
Lymph, poc: 3.2 (ref 0.6–3.4)
MCH: 30.3 pg (ref 27–31.2)
MCHC: 33.1 g/dL (ref 31.8–35.4)
MCV: 91.5 fL (ref 80–97)
MID (CBC): 0.7 (ref 0–0.9)
MPV: 7.2 fL (ref 0–99.8)
POC Granulocyte: 7.4 — AB (ref 2–6.9)
POC LYMPH PERCENT: 28.3 %L (ref 10–50)
POC MID %: 6 %M (ref 0–12)
Platelet Count, POC: 281 10*3/uL (ref 142–424)
RBC: 5.17 M/uL (ref 4.69–6.13)
RDW, POC: 14.3 %
WBC: 11.2 10*3/uL — AB (ref 4.6–10.2)

## 2014-05-02 LAB — POCT UA - MICROSCOPIC ONLY
Bacteria, U Microscopic: NEGATIVE
Casts, Ur, LPF, POC: NEGATIVE
Crystals, Ur, HPF, POC: NEGATIVE
Epithelial cells, urine per micros: NEGATIVE
MUCUS UA: NEGATIVE
RBC, URINE, MICROSCOPIC: NEGATIVE
WBC, UR, HPF, POC: NEGATIVE
Yeast, UA: NEGATIVE

## 2014-05-02 LAB — GLUCOSE, POCT (MANUAL RESULT ENTRY): POC Glucose: 76 mg/dl (ref 70–99)

## 2014-05-02 NOTE — Progress Notes (Addendum)
Subjective:    Patient ID: Lawrence Bennett., male    DOB: Apr 23, 1956, 58 y.o.   MRN: [id number]  HPI Chief Complaint  Patient presents with  . Hypertension    171/101 this morning  . Dizziness  . Fatigue  . Flank Pain    left side pain---comes and goes  . Eye    eyes gets red  . Tinnitus    ringing in the left--noticed more--in the right ear, clicking  . Gastrophageal Reflux  . Urinary Frequency    noticed an odor   This chart was scribed for Lawrence Queen, MD by Thea Alken, ED Scribe. This patient was seen in room 14 and the patient's care was started at 4:43 PM.  HPI Comments: Lawrence Bennett. is a 58 y.o. male who presents to the [facility] with multiple complaints.   Pt c/o hearing loss that he's had for a while with associated tinnitus and dizziness. Pt states he has been stumbling with walking and has had unsteadiness on his feet.  Pt reports his last BP read was 171/101 at home today. Pt was recently seen by cardiologist and had some adjustments made but reports he's had these symptoms for the past 3 months. He reports an associated dull HA which he took ibuprofen with relief to HA.  Pt denies numbness, tingling and weakness in extremities.   Pt reports hx diverticulitis and reports last colonoscopy was in 2012.   Past Medical History  Diagnosis Date  . Dyslipidemia   . Mild hypertension   . Anxiety   . Atrial contractions, premature   . Atherosclerosis of aorta   . Hyperlipidemia    No Known Allergies Prior to Admission medications   Medication Sig Start Date End Date Taking? Authorizing Provider  aspirin 325 MG EC tablet Take 325 mg by mouth daily.   Yes Historical Provider, MD  ibuprofen (ADVIL,MOTRIN) 200 MG tablet Take 400 mg by mouth at bedtime.    Yes Historical Provider, MD  LORazepam (ATIVAN) 0.5 MG tablet Take 0.5 mg by mouth 2 (two) times daily.    Yes Historical Provider, MD  losartan (COZAAR) 50 MG tablet Take 100 mg by  mouth daily.   Yes Historical Provider, MD  propranolol ER (INDERAL LA) 60 MG 24 hr capsule Take 1 capsule (60 mg total) by mouth daily. 04/15/14  Yes [name], MD   Review of Systems  HENT: Positive for hearing loss.   Neurological: Positive for dizziness and headaches.    Objective:   Physical Exam CONSTITUTIONAL: Well developed/well nourished HEAD: Normocephalic/atraumatic EYES: EOMI/PERRL ENMT: Mucous membranes moist, hearing loss in both ears. Ears are normal. Air conduction hearing better than bone conduction in bilateral ears. NECK: supple no meningeal signs SPINE:entire spine nontender CV: S1/S2 noted, no murmurs/rubs/gallops noted, BP 140/96 LUNGS: Lungs are clear to auscultation bilaterally, no apparent distress ABDOMEN: soft, nontender, no rebound or guarding GU:no cva tenderness NEURO: Pt is awake/alert, moves all extremitiesx4, steady gait. EXTREMITIES: pulses normal, full ROM SKIN: warm, color normal PSYCH: no abnormalities of mood noted Results for orders placed or performed in visit on 05/02/14  POCT CBC  Result Value Ref Range   WBC 11.2 (A) 4.6 - 10.2 K/uL   Lymph, poc 3.2 0.6 - 3.4   POC LYMPH PERCENT 28.3 10 - 50 %L   MID (cbc) 0.7 0 - 0.9   POC MID % 6.0 0 - 12 %M   POC Granulocyte 7.4 (  A) 2 - 6.9   Granulocyte percent 65.7 37 - 80 %G   RBC 5.17 4.69 - 6.13 M/uL   Hemoglobin 15.7 14.1 - 18.1 g/dL   HCT, POC 47.3 43.5 - 53.7 %   MCV 91.5 80 - 97 fL   MCH, POC 30.3 27 - 31.2 pg   MCHC 33.1 31.8 - 35.4 g/dL   RDW, POC 14.3 %   Platelet Count, POC 281 142 - 424 K/uL   MPV 7.2 0 - 99.8 fL  POCT glucose (manual entry)  Result Value Ref Range   POC Glucose 76 70 - 99 mg/dl  POCT urinalysis dipstick  Result Value Ref Range   Color, UA yellow    Clarity, UA clear    Glucose, UA neg    Bilirubin, UA neg    Ketones, UA neg    Spec Grav, UA <=1.005    Blood, UA trace-lysed    pH, UA 5.5    Protein, UA neg    Urobilinogen, UA 0.2    Nitrite,  UA neg    Leukocytes, UA Negative   POCT UA - Microscopic Only  Result Value Ref Range   WBC, Ur, HPF, POC neg    RBC, urine, microscopic neg    Bacteria, U Microscopic neg    Mucus, UA neg    Epithelial cells, urine per micros neg    Crystals, Ur, HPF, POC neg    Casts, Ur, LPF, POC neg    Yeast, UA neg     Assessment & Plan:  Patient is concerned about his health. He has a history of diverticulitis and his white cell count is up somewhat today. He does not have any other GI complaints. We'll do a CT abdomen pelvis with contrast. Have set him up for an MRI of the brain to evaluate his dizziness and hearing loss. He will also be able to follow-up with ENT.  I personally performed the services described in this documentation, which was scribed in my presence. The recorded information has been reviewed and is accurate.

## 2014-05-03 ENCOUNTER — Other Ambulatory Visit: Payer: Self-pay | Admitting: Radiology

## 2014-05-03 DIAGNOSIS — R109 Unspecified abdominal pain: Secondary | ICD-10-CM

## 2014-05-03 LAB — TSH: TSH: 0.797 u[IU]/mL (ref 0.350–4.500)

## 2014-05-03 NOTE — Progress Notes (Signed)
LMTCB

## 2014-05-04 ENCOUNTER — Telehealth: Payer: Self-pay | Admitting: Internal Medicine

## 2014-05-04 LAB — URINE CULTURE
COLONY COUNT: NO GROWTH
Organism ID, Bacteria: NO GROWTH

## 2014-05-04 LAB — PSA: PSA: 2.27 ng/mL (ref <=4.00)

## 2014-05-04 NOTE — Telephone Encounter (Signed)
Returned call to patient. He reports after his BP check appointment he was so nervous and shaky that he had to go to Urgent Care. He asked if his medications could be doing this, along with causing low back pain. B-Blocker was stopped at BP check appointment and amlodipine 2.5mg  was started. When asked about his BP readings, he stated they are still high but no numerical value was provided. Patient states he has an OV with his PCP on Friday and he was strongly encouraged to keep this appointment and also to schedule his 3 week BP check with Erasmo Downer.   Will route to scheduler to set up BP check visit & to Dr. Marily Lente as Juluis Rainier

## 2014-05-04 NOTE — Telephone Encounter (Signed)
Pt is returning Jenna's call from 11/2. Please call back  thanks

## 2014-05-05 ENCOUNTER — Other Ambulatory Visit: Payer: Self-pay | Admitting: Emergency Medicine

## 2014-05-05 DIAGNOSIS — Z139 Encounter for screening, unspecified: Secondary | ICD-10-CM

## 2014-05-06 ENCOUNTER — Telehealth: Payer: Self-pay | Admitting: Internal Medicine

## 2014-05-06 ENCOUNTER — Telehealth: Payer: Self-pay | Admitting: *Deleted

## 2014-05-06 NOTE — Telephone Encounter (Signed)
Pt called in regards to lab results. I informed him of results. He expressed understanding.

## 2014-05-06 NOTE — Telephone Encounter (Signed)
New message ° ° ° ° °Returning Kelly's call °

## 2014-05-06 NOTE — Telephone Encounter (Signed)
Left message for patient to return call.

## 2014-05-06 NOTE — Telephone Encounter (Signed)
Pt had labwork done and would like results.  Also wants to know if we have seen labwork that Dr. Everlene Farrier did.

## 2014-05-06 NOTE — Telephone Encounter (Signed)
Patient is wanting the labs done by Dr Everlene Farrier.   I have asked him to contact Dr Perfecto [place] office for results of labs that they drew.  He will do so

## 2014-05-11 ENCOUNTER — Telehealth: Payer: Self-pay | Admitting: Internal Medicine

## 2014-05-13 ENCOUNTER — Ambulatory Visit
Admission: RE | Admit: 2014-05-13 | Discharge: 2014-05-13 | Disposition: A | Discharge status: Home / Self Care | Payer: BC Managed Care – PPO | Source: Ambulatory Visit | Attending: Emergency Medicine | Admitting: Emergency Medicine

## 2014-05-13 ENCOUNTER — Other Ambulatory Visit: Payer: Self-pay | Admitting: Internal Medicine

## 2014-05-13 ENCOUNTER — Ambulatory Visit
Admission: RE | Admit: 2014-05-13 | Discharge: 2014-05-13 | Disposition: A | Discharge status: Home / Self Care | Payer: BC Managed Care – PPO | Source: Ambulatory Visit | Attending: Internal Medicine | Admitting: Internal Medicine

## 2014-05-13 DIAGNOSIS — R609 Edema, unspecified: Secondary | ICD-10-CM

## 2014-05-13 DIAGNOSIS — R109 Unspecified abdominal pain: Secondary | ICD-10-CM

## 2014-05-13 DIAGNOSIS — R42 Dizziness and giddiness: Secondary | ICD-10-CM

## 2014-05-13 DIAGNOSIS — Z139 Encounter for screening, unspecified: Secondary | ICD-10-CM

## 2014-05-13 MED ORDER — GADOBENATE DIMEGLUMINE 529 MG/ML IV SOLN
15.0000 mL | Freq: Once | INTRAVENOUS | Status: AC | PRN
  Administered 2014-05-13: 15 mL via INTRAVENOUS

## 2014-05-13 MED ORDER — IOHEXOL 300 MG/ML  SOLN
100.0000 mL | Freq: Once | INTRAMUSCULAR | Status: AC | PRN
  Administered 2014-05-13: 100 mL via INTRAVENOUS

## 2014-05-14 NOTE — Telephone Encounter (Signed)
Close encounter 

## 2014-05-17 ENCOUNTER — Other Ambulatory Visit: Payer: BC Managed Care – PPO

## 2014-05-21 ENCOUNTER — Ambulatory Visit: Payer: BC Managed Care – PPO | Admitting: Internal Medicine

## 2014-11-02 ENCOUNTER — Emergency Department (HOSPITAL_COMMUNITY)
Admission: EM | Admit: 2014-11-02 | Discharge: 2014-11-02 | Disposition: A | Discharge status: Home / Self Care | Payer: 59 | Attending: Emergency Medicine | Admitting: Emergency Medicine

## 2014-11-02 ENCOUNTER — Encounter (HOSPITAL_COMMUNITY): Payer: Self-pay | Admitting: Emergency Medicine

## 2014-11-02 DIAGNOSIS — Z8639 Personal history of other endocrine, nutritional and metabolic disease: Secondary | ICD-10-CM | POA: Insufficient documentation

## 2014-11-02 DIAGNOSIS — R4589 Other symptoms and signs involving emotional state: Secondary | ICD-10-CM

## 2014-11-02 DIAGNOSIS — Z720 Tobacco use: Secondary | ICD-10-CM | POA: Insufficient documentation

## 2014-11-02 DIAGNOSIS — Z79899 Other long term (current) drug therapy: Secondary | ICD-10-CM | POA: Insufficient documentation

## 2014-11-02 DIAGNOSIS — I10 Essential (primary) hypertension: Secondary | ICD-10-CM | POA: Insufficient documentation

## 2014-11-02 DIAGNOSIS — F419 Anxiety disorder, unspecified: Secondary | ICD-10-CM | POA: Diagnosis not present

## 2014-11-02 DIAGNOSIS — Z7982 Long term (current) use of aspirin: Secondary | ICD-10-CM | POA: Diagnosis not present

## 2014-11-02 DIAGNOSIS — L509 Urticaria, unspecified: Secondary | ICD-10-CM | POA: Insufficient documentation

## 2014-11-02 DIAGNOSIS — F331 Major depressive disorder, recurrent, moderate: Secondary | ICD-10-CM | POA: Diagnosis present

## 2014-11-02 DIAGNOSIS — F329 Major depressive disorder, single episode, unspecified: Secondary | ICD-10-CM | POA: Diagnosis not present

## 2014-11-02 LAB — COMPREHENSIVE METABOLIC PANEL
ALK PHOS: 77 U/L (ref 38–126)
ALT: 22 U/L (ref 17–63)
ANION GAP: 10 (ref 5–15)
AST: 20 U/L (ref 15–41)
Albumin: 4.4 g/dL (ref 3.5–5.0)
BILIRUBIN TOTAL: 1 mg/dL (ref 0.3–1.2)
BUN: 16 mg/dL (ref 6–20)
CHLORIDE: 104 mmol/L (ref 101–111)
CO2: 21 mmol/L — ABNORMAL LOW (ref 22–32)
Calcium: 9.8 mg/dL (ref 8.9–10.3)
Creatinine, Ser: 0.9 mg/dL (ref 0.61–1.24)
GFR calc Af Amer: >60 mL/min (ref >60)
GFR calc non Af Amer: >60 mL/min (ref >60)
GLUCOSE: 94 mg/dL (ref 70–99)
POTASSIUM: 4.2 mmol/L (ref 3.5–5.1)
SODIUM: 135 mmol/L (ref 135–145)
TOTAL PROTEIN: 7.4 g/dL (ref 6.5–8.1)

## 2014-11-02 LAB — CBC
HEMATOCRIT: 45.9 % (ref 39.0–52.0)
HEMOGLOBIN: 15.9 g/dL (ref 13.0–17.0)
MCH: 31 pg (ref 26.0–34.0)
MCHC: 34.6 g/dL (ref 30.0–36.0)
MCV: 89.5 fL (ref 78.0–100.0)
Platelets: 300 10*3/uL (ref 150–400)
RBC: 5.13 MIL/uL (ref 4.22–5.81)
RDW: 13.3 % (ref 11.5–15.5)
WBC: 8.8 10*3/uL (ref 4.0–10.5)

## 2014-11-02 LAB — ETHANOL

## 2014-11-02 LAB — ACETAMINOPHEN LEVEL

## 2014-11-02 LAB — SALICYLATE LEVEL

## 2014-11-02 MED ORDER — ACETAMINOPHEN 325 MG PO TABS: 650.0000 mg | ORAL_TABLET | ORAL | Status: DC | PRN

## 2014-11-02 MED ORDER — TRAZODONE HCL 50 MG PO TABS
50.0000 mg | ORAL_TABLET | Freq: Every evening | ORAL | Status: DC | PRN
  Filled 2014-11-02: qty 1

## 2014-11-02 MED ORDER — LORAZEPAM 1 MG PO TABS
1.0000 mg | ORAL_TABLET | Freq: Three times a day (TID) | ORAL | Status: DC | PRN
  Administered 2014-11-02: 1 mg via ORAL
  Filled 2014-11-02: qty 1

## 2014-11-02 MED ORDER — IBUPROFEN 200 MG PO TABS: 600.0000 mg | ORAL_TABLET | Freq: Three times a day (TID) | ORAL | Status: DC | PRN

## 2014-11-02 NOTE — ED Notes (Signed)
Patient states "I don't want to be here.  Nobody told me I would have to stay overnight."  Denies SI/HI/AVH.

## 2014-11-02 NOTE — ED Notes (Signed)
Patient irritable; appears anxious and restless. Worried about "being stuck here". Worried about bills and home. Denies SI, HI, AVH. Reports poor sleep and appetite in recent weeks.   Encouragement offered. Ativan provided. Environment adjusted. Patient signed consent form for his wife and doctor.  Q 15 safety checks continue

## 2014-11-02 NOTE — BH Assessment (Signed)
Tele Assessment Note   Lawrence Bennett. is an 59 y.o. male.   Axis I: Adjustment Disorder with Mixed Emotional Features [Depression and Anxiety] Axis II: No diagnosis Axis III:  Past Medical History  Diagnosis Date  . Dyslipidemia   . Mild hypertension   . Anxiety   . Atrial contractions, premature   . Atherosclerosis of aorta   . Hyperlipidemia    Axis IV: occupational problems and problems related to social environment, recent retirement Axis V: 41-50 serious symptoms  Past Medical History:  Past Medical History  Diagnosis Date  . Dyslipidemia   . Mild hypertension   . Anxiety   . Atrial contractions, premature   . Atherosclerosis of aorta   . Hyperlipidemia     History reviewed. No pertinent past surgical history.  Family History:  Family History  Problem Relation Age of Onset  . Stroke Father   . Hypertension Sister     Social History:  reports that he has been smoking Cigarettes.  He has a 3.8 pack-year smoking history. He has never used smokeless tobacco. He reports that he does not drink alcohol or use illicit drugs.  Additional Social History:  Alcohol / Drug Use Pain Medications: See PTA List Prescriptions: See PTA List Over the Counter: See PTA List History of alcohol / drug use?: No history of alcohol / drug abuse  CIWA: CIWA-Ar BP: 140/82 mmHg Pulse Rate: 73 COWS:    PATIENT STRENGTHS: (choose at least two) Ability for insight Average or above average intelligence Capable of independent living General fund of knowledge Supportive family/friends  Allergies: No Known Allergies  Home Medications:  (Not in a hospital admission)  OB/GYN Status:  No LMP for male patient.  General Assessment Data Location of Assessment: [facility] ED Is this a Tele or Face-to-Face Assessment?: Face-to-Face Is this an Initial Assessment or a Re-assessment for this encounter?: Initial Assessment Marital status: Married Is patient pregnant?: No Pregnancy Status:  No Living Arrangements: Spouse/significant other, Children Can pt return to current living arrangement?: Yes Admission Status: Voluntary Is patient capable of signing voluntary admission?: Yes Referral Source: Self/Family/Friend Insurance type: [place] Living Arrangements: Spouse/significant other, Children Name of Psychiatrist: None Name of Therapist: None  Education Status Is patient currently in school?: No Current Grade: na Highest grade of school patient has completed: na Name of school: na Contact person: na  Risk to self with the past 6 months Suicidal Ideation: No Has patient been a risk to self within the past 6 months prior to admission? : No Suicidal Intent: No Has patient had any suicidal intent within the past 6 months prior to admission? : No Is patient at risk for suicide?: No Suicidal Plan?: No Has patient had any suicidal plan within the past 6 months prior to admission? : No Access to Means: No What has been your use of drugs/alcohol within the last 12 months?: None Previous Attempts/Gestures: No How many times?: 0 Other Self Harm Risks: Pt's spouses says he has hit his head on wall in frustration before Triggers for Past Attempts:  (n/a) Intentional Self Injurious Behavior: None Family Suicide History: No Recent stressful life event(s): Job Loss, Other (Comment) (Pt recently retired, which triggered increased depression) Persecutory voices/beliefs?: No Depression: Yes Depression Symptoms: Insomnia, Isolating, Guilt, Loss of interest in usual pleasures, Feeling angry/irritable Substance abuse history and/or treatment for substance abuse?: No Suicide prevention information given to non-admitted patients: Yes  Risk to Others  within the past 6 months Homicidal Ideation: No Does patient have any lifetime risk of violence toward others beyond the six months prior to admission? : No Thoughts of Harm to Others: No Current  Homicidal Intent: No Current Homicidal Plan: No Access to Homicidal Means: No History of harm to others?: No Assessment of Violence: None Noted Violent Behavior Description: Pt irritable but calm during assessment Does patient have access to weapons?: No Criminal Charges Pending?: No Does patient have a court date: No Is patient on probation?: No  Psychosis Hallucinations: None noted Delusions: None noted  Mental Status Report Appearance/Hygiene: In scrubs Eye Contact: Fair Motor Activity: Restlessness Speech: Logical/coherent Level of Consciousness: Quiet/awake Mood: Depressed Affect: Irritable Anxiety Level: Moderate Thought Processes: Coherent, Relevant Judgement: Unimpaired Orientation: Person, Place, Time Obsessive Compulsive Thoughts/Behaviors: Moderate ([place] physical Px and family Px)  Cognitive Functioning Concentration: Good Memory: Recent Intact IQ: Average Insight: Fair Impulse Control: Good Appetite: Fair Weight Loss: 8 (inpast 2-3 months) Weight Gain: 0 Sleep: Decreased Total Hours of Sleep: 6 Vegetative Symptoms: None  ADLScreening [facility] Assessment Services) Patient's cognitive ability adequate to safely complete daily activities?: Yes Patient able to express need for assistance with ADLs?: Yes Independently performs ADLs?: Yes (appropriate for developmental age)  Prior Inpatient Therapy Prior Inpatient Therapy: No Prior Therapy Dates: na Prior Therapy Facilty/Provider(s): na Reason for Treatment: na  Prior Outpatient Therapy Prior Outpatient Therapy: No Prior Therapy Dates: na Prior Therapy Facilty/Provider(s): na Reason for Treatment: na Does patient have an ACCT team?: No Does patient have Intensive In-House Services?  : No Does patient have [facility] services? : No Does patient have P4CC services?: No  ADL Screening (condition at time of admission) Patient's cognitive ability adequate to safely complete daily activities?: Yes Is the  patient deaf or have difficulty hearing?: No Does the patient have difficulty seeing, even when wearing glasses/contacts?: No Does the patient have difficulty concentrating, remembering, or making decisions?: No Patient able to express need for assistance with ADLs?: Yes Does the patient have difficulty dressing or bathing?: No Independently performs ADLs?: Yes (appropriate for developmental age) Does the patient have difficulty walking or climbing stairs?: No Weakness of Legs: None Weakness of Arms/Hands: None  Home Assistive Devices/Equipment Home Assistive Devices/Equipment: None    Abuse/Neglect Assessment (Assessment to be complete while patient is alone) Physical Abuse: Denies Verbal Abuse: Denies Sexual Abuse: Denies Exploitation of patient/patient's resources: Denies Self-Neglect: Denies Values / Beliefs Cultural Requests During Hospitalization: None Spiritual Requests During Hospitalization: None   Advance Directives (For Healthcare) Does patient have an advance directive?: No Would patient like information on creating an advanced directive?: No - patient declined information    Additional Information 1:1 In Past 12 Months?: No CIRT Risk: No Elopement Risk: No Does patient have medical clearance?: Yes     Disposition: Per Patriciaann Clan, PA pt to be d/c with OP resources and signing of no-harm contract.  Disposition Initial Assessment Completed for this Encounter: Yes Disposition of Patient: Outpatient treatment Type of outpatient treatment: Adult (Resources given to pt and pt's wife for f/u)  Colin Ina 11/02/2014 9:25 PM

## 2014-11-02 NOTE — ED Notes (Signed)
Pt. Is unable to use the restroom at this time, but is aware that we need a urine specimen.  

## 2014-11-02 NOTE — ED Notes (Signed)
Bed: [facility] Expected date:  Expected time:  Means of arrival:  Comments: Tr 2

## 2014-11-02 NOTE — ED Notes (Signed)
TTS in with the patient. 

## 2014-11-02 NOTE — ED Notes (Signed)
Acuity low.

## 2014-11-02 NOTE — ED Provider Notes (Addendum)
CSN: [id number]     Arrival date & time 11/02/14  1452 History   First MD Initiated Contact with Patient 11/02/14 1550     No chief complaint on file.    (Consider location/radiation/quality/duration/timing/severity/associated sxs/prior Treatment) The history is provided by the patient and the spouse.  Lawrence Canter. is a 59 y.o. male hx of HL, HTN, here with anxiety, depression. He retired November. Since then he has not been very active. Over the last several weeks, he has been very anxious over his health was concerned about his vision and hearing. As per his wife, patient has been feeling hopeless and has not been sleeping well. Has decreased appetite. Denies suicidal or homicidal ideations. Patient has noticed rash on the right face for several months but that is not getting worse and is not itchy. He was recently placed on sertraline by PMD with no improvement of symptoms.    Past Medical History  Diagnosis Date  . Dyslipidemia   . Mild hypertension   . Anxiety   . Atrial contractions, premature   . Atherosclerosis of aorta   . Hyperlipidemia    History reviewed. No pertinent past surgical history. Family History  Problem Relation Age of Onset  . Stroke Father   . Hypertension Sister    History  Substance Use Topics  . Smoking status: Current Every Day Smoker -- 0.20 packs/day for 19 years    Types: Cigarettes  . Smokeless tobacco: Never Used  . Alcohol Use: No    Review of Systems  Psychiatric/Behavioral: Positive for dysphoric mood.  All other systems reviewed and are negative.     Allergies  Review of patient's allergies indicates no known allergies.  Home Medications   Prior to Admission medications   Medication Sig Start Date End Date Taking? Authorizing Provider  aspirin 325 MG EC tablet Take 325 mg by mouth daily.   Yes Historical Provider, MD  LORazepam (ATIVAN) 0.5 MG tablet Take 0.5 mg by mouth 3 (three) times daily.    Yes Historical Provider,  MD  losartan (COZAAR) 100 MG tablet Take 100 mg by mouth daily.   Yes Historical Provider, MD  sertraline (ZOLOFT) 50 MG tablet Take 50 mg by mouth daily.   Yes Historical Provider, MD  propranolol ER (INDERAL LA) 60 MG 24 hr capsule Take 1 capsule (60 mg total) by mouth daily. Patient not taking: Reported on 11/02/2014 04/15/14   [name], MD   BP 128/99 mmHg  Pulse 89  Temp(Src) 97.9 F (36.6 C) (Oral)  Resp 18  SpO2 99% Physical Exam  Constitutional: He is oriented to person, place, and time.  Depressed, guarded   HENT:  Head: Normocephalic.  Mouth/Throat: Oropharynx is clear and moist.  Mild urticaria around R corner of mouth, not involving MM. Not vesicular.   Eyes: Conjunctivae are normal. Pupils are equal, round, and reactive to light.  Neck: Normal range of motion. Neck supple.  Cardiovascular: Normal rate, regular rhythm and normal heart sounds.   Pulmonary/Chest: Effort normal and breath sounds normal. No respiratory distress. He has no wheezes. He has no rales.  Abdominal: Soft. Bowel sounds are normal. He exhibits no distension. There is no tenderness. There is no rebound and no guarding.  Musculoskeletal: Normal range of motion. He exhibits no edema or tenderness.  Neurological: He is alert and oriented to person, place, and time. No cranial nerve deficit. Coordination normal.  Skin: Skin is warm and dry.  Psychiatric:  Depressed mood. Guarded.  Nursing note and vitals reviewed.   ED Course  Procedures (including critical care time) Labs Review Labs Reviewed  ACETAMINOPHEN LEVEL - Abnormal; Notable for the following:    Acetaminophen (Tylenol), Serum <10 (*)    All other components within normal limits  COMPREHENSIVE METABOLIC PANEL - Abnormal; Notable for the following:    CO2 21 (*)    All other components within normal limits  CBC  ETHANOL  SALICYLATE LEVEL  URINE RAPID DRUG SCREEN (HOSP PERFORMED)    Imaging Review No results found.   EKG  Interpretation None      MDM   Final diagnoses:  None   Lawrence Bennett. is a 59 y.o. male here with anxiety, depression. Also has urticaria likely viral vs allergic, not concerned for shingles or steven johnson's. Will get psych clearance labs, consult TTS.   5:44 PM Labs unremarkable. Will consult TTS. Medically cleared.   9:12 PM TTS saw patient. Given him list of resources. Will dc home     Wandra Arthurs, MD 11/02/14 [place] Lawrence Spina, MD 11/02/14 2112

## 2014-11-02 NOTE — ED Notes (Signed)
Pt c/o severe depression x 3 months, anxiety over his health - particularly his worsening vision and hearing- and family. Pt denies SI/HI. Reddened rash to right face. Patient's family member states that patient over-reacts to any negative event, worries excessively, feels hopeless, states no one can help him.

## 2014-11-02 NOTE — BHH Counselor (Signed)
Per Patriciaann Clan, PA pt to be d/c with OP resources and signing of no-harm contract.  Pt cooperatively signed Theatre manager, as well as the counselor and pt's wife. Pt received a copy of the contract (in addition to the list of outpatient resources) for his own reference, should he begin to experience any SI/HI or urges to self-harm.  TTS Counselor spoke privately with pt's wife. She stated that she does feel comfortable taking the pt home. She agreed to ensure that her husband locates a psychiatrist and/or therapist and follows up on appts.  EDP, Dr Doree Fudge, was notified of disposition and is in agreement.   Ramond Dial, [facility] Triage Specialist

## 2014-11-02 NOTE — Discharge Instructions (Signed)
See a counselor or psychiatrist.   Follow up with your doctor.   Return to ER if you have thoughts of harming yourself or others, hallucinations, worse depression.

## 2014-11-07 ENCOUNTER — Emergency Department (HOSPITAL_COMMUNITY)
Admission: EM | Admit: 2014-11-07 | Discharge: 2014-11-08 | Disposition: A | Discharge status: Home / Self Care | Payer: 59 | Attending: Emergency Medicine | Admitting: Emergency Medicine

## 2014-11-07 ENCOUNTER — Encounter (HOSPITAL_COMMUNITY): Payer: Self-pay | Admitting: Emergency Medicine

## 2014-11-07 DIAGNOSIS — Z79899 Other long term (current) drug therapy: Secondary | ICD-10-CM | POA: Insufficient documentation

## 2014-11-07 DIAGNOSIS — Z8639 Personal history of other endocrine, nutritional and metabolic disease: Secondary | ICD-10-CM | POA: Insufficient documentation

## 2014-11-07 DIAGNOSIS — F332 Major depressive disorder, recurrent severe without psychotic features: Secondary | ICD-10-CM | POA: Diagnosis not present

## 2014-11-07 DIAGNOSIS — F1310 Sedative, hypnotic or anxiolytic abuse, uncomplicated: Secondary | ICD-10-CM | POA: Diagnosis not present

## 2014-11-07 DIAGNOSIS — F419 Anxiety disorder, unspecified: Secondary | ICD-10-CM | POA: Insufficient documentation

## 2014-11-07 DIAGNOSIS — Z046 Encounter for general psychiatric examination, requested by authority: Secondary | ICD-10-CM | POA: Diagnosis present

## 2014-11-07 DIAGNOSIS — Z720 Tobacco use: Secondary | ICD-10-CM | POA: Diagnosis not present

## 2014-11-07 DIAGNOSIS — I10 Essential (primary) hypertension: Secondary | ICD-10-CM | POA: Diagnosis not present

## 2014-11-07 DIAGNOSIS — F333 Major depressive disorder, recurrent, severe with psychotic symptoms: Secondary | ICD-10-CM

## 2014-11-07 DIAGNOSIS — R45851 Suicidal ideations: Secondary | ICD-10-CM

## 2014-11-07 LAB — CBC WITH DIFFERENTIAL/PLATELET
BASOS ABS: 0.1 10*3/uL (ref 0.0–0.1)
BASOS PCT: 1 % (ref 0–1)
Eosinophils Absolute: 0.1 10*3/uL (ref 0.0–0.7)
Eosinophils Relative: 1 % (ref 0–5)
HCT: 49.3 % (ref 39.0–52.0)
Hemoglobin: 17.2 g/dL — ABNORMAL HIGH (ref 13.0–17.0)
LYMPHS PCT: 28 % (ref 12–46)
Lymphs Abs: 2.7 10*3/uL (ref 0.7–4.0)
MCH: 31.4 pg (ref 26.0–34.0)
MCHC: 34.9 g/dL (ref 30.0–36.0)
MCV: 90 fL (ref 78.0–100.0)
MONOS PCT: 7 % (ref 3–12)
Monocytes Absolute: 0.7 10*3/uL (ref 0.1–1.0)
NEUTROS ABS: 6.2 10*3/uL (ref 1.7–7.7)
Neutrophils Relative %: 63 % (ref 43–77)
Platelets: 318 10*3/uL (ref 150–400)
RBC: 5.48 MIL/uL (ref 4.22–5.81)
RDW: 13.4 % (ref 11.5–15.5)
WBC: 9.8 10*3/uL (ref 4.0–10.5)

## 2014-11-07 LAB — COMPREHENSIVE METABOLIC PANEL
ALBUMIN: 4.6 g/dL (ref 3.5–5.0)
ALK PHOS: 80 U/L (ref 38–126)
ALT: 21 U/L (ref 17–63)
ANION GAP: 15 (ref 5–15)
AST: 28 U/L (ref 15–41)
BUN: 14 mg/dL (ref 6–20)
CO2: 17 mmol/L — AB (ref 22–32)
Calcium: 9.7 mg/dL (ref 8.9–10.3)
Chloride: 103 mmol/L (ref 101–111)
Creatinine, Ser: 1.06 mg/dL (ref 0.61–1.24)
GFR calc Af Amer: >60 mL/min (ref >60)
GFR calc non Af Amer: >60 mL/min (ref >60)
Glucose, Bld: 167 mg/dL — ABNORMAL HIGH (ref 70–99)
Potassium: 3.7 mmol/L (ref 3.5–5.1)
Sodium: 135 mmol/L (ref 135–145)
Total Bilirubin: 0.7 mg/dL (ref 0.3–1.2)
Total Protein: 7.9 g/dL (ref 6.5–8.1)

## 2014-11-07 LAB — RAPID URINE DRUG SCREEN, HOSP PERFORMED
AMPHETAMINES: NOT DETECTED
BARBITURATES: NOT DETECTED
BENZODIAZEPINES: POSITIVE — AB
Cocaine: NOT DETECTED
Opiates: NOT DETECTED
Tetrahydrocannabinol: NOT DETECTED

## 2014-11-07 LAB — ACETAMINOPHEN LEVEL

## 2014-11-07 LAB — ETHANOL: Alcohol, Ethyl (B): <5 mg/dL (ref <5)

## 2014-11-07 LAB — CBG MONITORING, ED: Glucose-Capillary: 160 mg/dL — ABNORMAL HIGH (ref 70–99)

## 2014-11-07 LAB — SALICYLATE LEVEL

## 2014-11-07 MED ORDER — LORAZEPAM 1 MG PO TABS
2.0000 mg | ORAL_TABLET | Freq: Once | ORAL | Status: AC
  Administered 2014-11-07: 2 mg via ORAL
  Filled 2014-11-07: qty 2

## 2014-11-07 MED ORDER — ZIPRASIDONE MESYLATE 20 MG IM SOLR: 20.0000 mg | Freq: Once | INTRAMUSCULAR | Status: DC

## 2014-11-07 MED ORDER — ZIPRASIDONE MESYLATE 20 MG IM SOLR
20.0000 mg | Freq: Once | INTRAMUSCULAR | Status: AC
  Administered 2014-11-07: 20 mg via INTRAMUSCULAR

## 2014-11-07 NOTE — ED Notes (Signed)
Pt ambulated to restroom to provide urine sample. Changed into paper scrubs.

## 2014-11-07 NOTE — ED Notes (Signed)
Pt sleepy. Arouses to verbal stimuli. When asked by RN why he came to ED pt states "I just can't take it anymore". When asked by RN if he wants to hurt himself pt nods head "yes". Denies plan. Denies HI.

## 2014-11-07 NOTE — ED Provider Notes (Signed)
CSN: [id number]     Arrival date & time 11/07/14  1021 History  This chart was scribed for Lawrence Furry, MD by [name], ED Scribe. This patient was seen in room APA16A/APA16A and the patient's care was started at 10:10 AM.    Chief Complaint  Patient presents with  . Medical Clearance   The history is provided by the police. The history is limited by the condition of the patient. No language interpreter was used.     LEVEL 5 CAVEAT DUE TO: PATIENT NONVERBAL  HPI Comments: Lawrence Bennett. is a 59 y.o. male with a PMHx of anxiety, HTN, and HLD, brought into the Emergency Department by [facility] for SI/medical clearance. Per sheriff, patient called EMS reporting depression and SI and requesting to be brought into a hospital. Patient was behaving normally when the sheriffs arrived and escorted him to the car, but en route patient became agitated and nonverbal. Upon arrival, he refused to stand and began yelling, not responding to questions. Sheriffs then handcuffed him and brought him in in a wheelchair.  He is now laying on bed, handcuffed, moaning loudly, with no understandable intelligible words.   Past Medical History  Diagnosis Date  . Dyslipidemia   . Mild hypertension   . Anxiety   . Atrial contractions, premature   . Atherosclerosis of aorta   . Hyperlipidemia    History reviewed. No pertinent past surgical history. Family History  Problem Relation Age of Onset  . Stroke Father   . Hypertension Sister    History  Substance Use Topics  . Smoking status: Current Every Day Smoker -- 0.20 packs/day for 19 years    Types: Cigarettes  . Smokeless tobacco: Never Used  . Alcohol Use: No    Review of Systems  Unable to perform ROS: Patient nonverbal      Allergies  Review of patient's allergies indicates no known allergies.  Home Medications   Prior to Admission medications   Medication Sig Start Date End Date Taking? Authorizing Provider  LORazepam  (ATIVAN) 0.5 MG tablet Take 0.5 mg by mouth 3 (three) times daily.    Yes Historical Provider, MD  losartan (COZAAR) 100 MG tablet Take 100 mg by mouth daily.   Yes Historical Provider, MD  sertraline (ZOLOFT) 50 MG tablet Take 50 mg by mouth daily.   Yes Historical Provider, MD  propranolol ER (INDERAL LA) 60 MG 24 hr capsule Take 1 capsule (60 mg total) by mouth daily. Patient not taking: Reported on 11/02/2014 04/15/14   [name], MD   BP 157/102 mmHg  Pulse 104  Temp(Src) 98.4 F (36.9 C)  Resp 23  SpO2 98% Physical Exam  Constitutional: He is oriented to person, place, and time. He appears well-developed and well-nourished.  HENT:  Head: Normocephalic.  Eyes: Conjunctivae are normal. Pupils are equal, round, and reactive to light. No scleral icterus.  Neck: Normal range of motion. Neck supple. No thyromegaly present.  Cardiovascular: Normal rate and regular rhythm.  Exam reveals no gallop and no friction rub.   No murmur heard. Pulmonary/Chest: Effort normal and breath sounds normal. No respiratory distress. He has no wheezes. He has no rales.  Abdominal: Soft. Bowel sounds are normal. He exhibits no distension. There is no tenderness. There is no rebound.  Musculoskeletal: Normal range of motion.  Neurological: He is alert and oriented to person, place, and time.  Skin: Skin is warm and dry. No rash noted.  Psychiatric: He  is agitated and hyperactive. He is noncommunicative.  Moaning. Agitated.  Nursing note and vitals reviewed.   ED Course  Procedures (including critical care time)  DIAGNOSTIC STUDIES: Oxygen Saturation is 99% on RA, normal by my interpretation.    COORDINATION OF CARE: 10:34 AM - Pt's daughter arrived to ED.  Labs Review Labs Reviewed  CBC WITH DIFFERENTIAL/PLATELET - Abnormal; Notable for the following:    Hemoglobin 17.2 (*)    All other components within normal limits  COMPREHENSIVE METABOLIC PANEL - Abnormal; Notable for the following:     CO2 17 (*)    Glucose, Bld 167 (*)    All other components within normal limits  ACETAMINOPHEN LEVEL - Abnormal; Notable for the following:    Acetaminophen (Tylenol), Serum <10 (*)    All other components within normal limits  URINE RAPID DRUG SCREEN (HOSP PERFORMED) - Abnormal; Notable for the following:    Benzodiazepines POSITIVE (*)    All other components within normal limits  CBG MONITORING, ED - Abnormal; Notable for the following:    Glucose-Capillary 160 (*)    All other components within normal limits  ETHANOL  SALICYLATE LEVEL    Imaging Review No results found.   EKG Interpretation None      MDM   Final diagnoses:  Severe recurrent major depressive disorder with psychotic features  Suicidal thoughts   Patient initially quite sedate after IM Geodon. I reexamined him several times. He is able to be removed from handcuffs. Skin is reinspected. He has a small area of skin tear on his right arm. No other signs of injury. Full range of motion to all extremities. No other areas of pain or concern. He is speaking a few words at a time now. He is interacting with his wife , more so than with staff. TTS evaluation is pending.  I had a discussion with patient's daughter, younger in older son, and patient's wife. All of them expressed great concern that he is so vegetative, frequent speaking of hurting himself, speaking of not having a further desire to live, he is eating little, he does not leave the home without significant anxiety. I feel he needs inpatient psychiatric care I personally performed the services described in this documentation, which was scribed in my presence. The recorded information has been reviewed and is accurate.     Lawrence Furry, MD 11/07/14 [phone]

## 2014-11-07 NOTE — ED Notes (Signed)
Patient brought in vi [employer]. Patient yelling, refusing to stand, does not answer questions. Per [facility] patient called EMS reporting depression and SI. Sheriff states patient was fine upon their arrival and just became agitated and uncooperative when they arrived at ED.

## 2014-11-07 NOTE — ED Notes (Addendum)
Pt given IM Geodon. Pt placed lying on back with right arm and left leg cuffed to bed by [employer]. [employer] x 3 at bedside. Pt moaning/screaming with eyes closed. Pt not answering questions. Pt on monitor. Belt and shoes removed from pt.

## 2014-11-07 NOTE — ED Notes (Signed)
EDP in room to assess patient. Patient laying face down screaming into mattress with hands cuffed behind back. O2 sat 99%. Sheriffs at bedside.

## 2014-11-07 NOTE — BH Assessment (Signed)
Assessment Note  Lawrence Bennett. is an 59 y.o. male, the Patient reports calling the [facility] because he wanted help for his depression and SI.  Patient became combative during transport to [place] and had to be handcuffed.  Patient would not answer questions or follow directions once in [facility].  Patient became combative to a point that he was given Geodon.  Patient currently presenting irritable and reports hearing difficulty and would not answer several questions.  Patient's Spouse, Lawrence Bennett, was present during the assessment.  Patient presented orientated x3, mood "depressed, feeling tired, and sad,"  Patient denied current SI, HI, AV, and VH.  Patient's Spouse reports during the past 2 weeks he has been in bed most of the day and stopped taking care of personal hygiene.  She reports the Patient sleeps approximately 2 hours per night and sleeps during the day.  She reports the Patient has lost 10 lbs over the past 2 months.  Spouse reports the Patient has progressively deteriorated since retiring in November 2015.  She reports the Patient has vision and hearing problems and the prognosis for improvement is poor.  Spouse reports the family is having financial problems because 2 children are college.  Spouse reports Patient is prescribed Zoloft by PCP for depression.  She reports an appointment was scheduld with a outpatient mental health provider from their 11-02-2014 visit to [facility] and the Patient did not attend.  Patient and Spouse denied any use of alcohol or any other drugs.           Axis I: Adjustment Disorder with Depressed Mood Axis II: Deferred Axis IV: economic problems and other psychosocial or environmental problems Axis V: 41-50 serious symptoms  Past Medical History:  Past Medical History  Diagnosis Date  . Dyslipidemia   . Mild hypertension   . Anxiety   . Atrial contractions, premature   . Atherosclerosis of aorta   . Hyperlipidemia     History reviewed. No  pertinent past surgical history.  Family History:  Family History  Problem Relation Age of Onset  . Stroke Father   . Hypertension Sister     Social History:  reports that he has been smoking Cigarettes.  He has a 3.8 pack-year smoking history. He has never used smokeless tobacco. He reports that he does not drink alcohol or use illicit drugs.  Additional Social History:     CIWA: CIWA-Ar BP: (!) 157/102 mmHg Pulse Rate: 104 COWS:    Allergies: No Known Allergies  Home Medications:  (Not in a hospital admission)  OB/GYN Status:  No LMP for male patient.  General Assessment Data Location of Assessment: [facility] TTS Assessment: In system Is this a Tele or Face-to-Face Assessment?: Tele Assessment Is this an Initial Assessment or a Re-assessment for this encounter?: Initial Assessment Marital status: Married Spray name: N/A Is patient pregnant?: No Pregnancy Status: Other (Comment) (Male) Living Arrangements: Spouse/significant other, Children Can pt return to current living arrangement?: Yes Admission Status: Voluntary Is patient capable of signing voluntary admission?: Yes Referral Source: Self/Family/Friend Insurance type: [employer] (Spouse is the employee)  Medical Screening Exam ([place]) Medical Exam completed: Yes  Crisis Care Plan Living Arrangements: Spouse/significant other, Children Name of Psychiatrist: None Name of Therapist: None  Education Status Is patient currently in school?: No Current Grade: N/A Highest grade of school patient has completed: N/A Name of school: N/A Contact person: N/A  Risk to self with the past 6 months Suicidal  Ideation: No-Not Currently/Within Last 6 Months Has patient been a risk to self within the past 6 months prior to admission? : No Suicidal Intent: No Has patient had any suicidal intent within the past 6 months prior to admission? : No Is patient at risk for suicide?: No Suicidal Plan?: No Has  patient had any suicidal plan within the past 6 months prior to admission? : No Access to Means: No What has been your use of drugs/alcohol within the last 12 months?: No Previous Attempts/Gestures: No How many times?: 0 Other Self Harm Risks: No Triggers for Past Attempts: None known Intentional Self Injurious Behavior: None Family Suicide History: Unknown Recent stressful life event(s): Financial Problems, Other (Comment) (Retired in November 2015) Persecutory voices/beliefs?: No Depression: Yes Depression Symptoms: Insomnia, Fatigue, Feeling worthless/self pity, Loss of interest in usual pleasures Substance abuse history and/or treatment for substance abuse?: No Suicide prevention information given to non-admitted patients: Not applicable  Risk to Others within the past 6 months Homicidal Ideation: No Does patient have any lifetime risk of violence toward others beyond the six months prior to admission? : No Thoughts of Harm to Others: No Current Homicidal Intent: No Current Homicidal Plan: No Access to Homicidal Means: No Identified Victim: N/A History of harm to others?: No Assessment of Violence: On admission Violent Behavior Description: Patient yelling and screaming , and not responding  to questions. Does patient have access to weapons?: No (Spouse reports moving 2 guns to a place unknown to Patient) Criminal Charges Pending?: No Does patient have a court date: No Is patient on probation?: No  Psychosis Hallucinations: None noted Delusions: None noted  Mental Status Report Appearance/Hygiene: In hospital gown Eye Contact: Poor Motor Activity: Restlessness Speech: Soft, Slow (Patient reports difficulty hearing questions) Level of Consciousness: Alert, Restless Mood: Depressed Affect: Depressed, Irritable Anxiety Level: Moderate Thought Processes: Coherent Judgement: Impaired Orientation: Person, Place, Time, Situation Obsessive Compulsive Thoughts/Behaviors:  None  Cognitive Functioning Concentration: Decreased Memory: Recent Intact, Remote Intact IQ: Average Insight: Poor Impulse Control: Poor Appetite: Poor Weight Loss: 10 Weight Gain: 0 Sleep: Decreased Total Hours of Sleep: 3 (Sleeping during the day ) Vegetative Symptoms: Staying in bed, Not bathing (Spouse reports past 2 wks staying in the bed during most of )  ADLScreening [facility]) Patient's cognitive ability adequate to safely complete daily activities?: Yes Patient able to express need for assistance with ADLs?: Yes Independently performs ADLs?: Yes (appropriate for developmental age)  Prior Inpatient Therapy Prior Inpatient Therapy: No Prior Therapy Dates: na Prior Therapy Facilty/Provider(s): na Reason for Treatment: na  Prior Outpatient Therapy Prior Outpatient Therapy: No Prior Therapy Dates: na Prior Therapy Facilty/Provider(s): na Reason for Treatment: na Does patient have an ACCT team?: No Does patient have Intensive In-House Services?  : No Does patient have [facility] services? : No Does patient have P4CC services?: No  ADL Screening (condition at time of admission) Patient's cognitive ability adequate to safely complete daily activities?: Yes Is the patient deaf or have difficulty hearing?: Yes Does the patient have difficulty seeing, even when wearing glasses/contacts?: Yes Does the patient have difficulty concentrating, remembering, or making decisions?: No Patient able to express need for assistance with ADLs?: Yes Does the patient have difficulty dressing or bathing?: No Independently performs ADLs?: Yes (appropriate for developmental age) Weakness of Legs: None Weakness of Arms/Hands: None  Home Assistive Devices/Equipment Home Assistive Devices/Equipment: None    Abuse/Neglect Assessment (Assessment to be complete while patient is alone) Physical Abuse: Denies Verbal Abuse:  Denies Sexual Abuse: Denies Exploitation of  patient/patient's resources: Denies Self-Neglect: Denies Values / Beliefs Cultural Requests During Hospitalization: None Spiritual Requests During Hospitalization: None        Additional Information 1:1 In Past 12 Months?: No CIRT Risk: Yes Elopement Risk: No Does patient have medical clearance?: Yes     Disposition:  Disposition Initial Assessment Completed for this Encounter: Yes Disposition of Patient: Inpatient treatment program Type of inpatient treatment program: Adult  On Site Evaluation by:   Reviewed with Physician:    Dey-Johnson,Silvina Hackleman 11/07/2014 5:02 PM

## 2014-11-07 NOTE — BH Assessment (Addendum)
1615:  Consult with Dr. Lacinda Axon about the Patient.  1617:  Scheduled tele-assessment.  1642:  Tele-assessment completed  1713:  Consulted with [facility]ender Catalina Pizza, NP about Patient.  Per Extender Patient meets inpatient based on [facility] assessment on 11-02-2014 and today's agitated behavior and previous report of SI.    1726:  Dr. Lacinda Axon provided Patient's disposition.

## 2014-11-08 DIAGNOSIS — F331 Major depressive disorder, recurrent, moderate: Secondary | ICD-10-CM | POA: Diagnosis present

## 2014-11-08 MED ORDER — LORAZEPAM 0.5 MG PO TABS
0.5000 mg | ORAL_TABLET | Freq: Three times a day (TID) | ORAL | Status: DC
  Administered 2014-11-08: 0.5 mg via ORAL
  Filled 2014-11-08: qty 1

## 2014-11-08 MED ORDER — LOSARTAN POTASSIUM 50 MG PO TABS
100.0000 mg | ORAL_TABLET | Freq: Every day | ORAL | Status: DC
  Administered 2014-11-08: 100 mg via ORAL
  Filled 2014-11-08 (×4): qty 2

## 2014-11-08 MED ORDER — SERTRALINE HCL 50 MG PO TABS
50.0000 mg | ORAL_TABLET | Freq: Every day | ORAL | Status: DC
  Administered 2014-11-08: 50 mg via ORAL
  Filled 2014-11-08: qty 1

## 2014-11-08 NOTE — ED Notes (Addendum)
Per Katharine Look, charge nurse,  we will move patient and allow patient to wait in hallway 8 until his wife arrives and he is discharged

## 2014-11-08 NOTE — ED Notes (Signed)
Notified EDP of Home medications needed.

## 2014-11-08 NOTE — Progress Notes (Signed)
Writer faxed Progress note and Home medications list to [place] at [facility]).  Verlon Setting, [place] Disposition staff 11/08/2014 4:44 PM

## 2014-11-08 NOTE — ED Notes (Signed)
Patient up for discharge I called his wife who stated she would no be able to pick him up until after work around 5 or 5:30. I explained to her since it would be that long the patient would have to wait for her in our waiting room. Wife frustrated at this rule. I explained to her if she or any adult is able to come pick up patient we can let him occupy the room until she is here (as long as she or someone else is on their way to pick him up). She stated that wither she or her daughter would be on the way. I explained to her patient could stay in physical for about 1 hour awaiting their arrival.

## 2014-11-08 NOTE — Progress Notes (Signed)
Spoke with Catalina Pizza, DNP, who has evaluated pt today and recommended d/c with outpatient psychiatric follow-up.   CSW spoke with pt's spouse, who agreed for CSW to assist with scheduling outpatient psychiatric appt. [place]) and spoke with Sunday Spillers, who assisted in scheduling pt appointment to begin psychiatric services.  Appt information: Thursday, June 16th, 2016 8:45am (arrive 8am to fill out preliminary paperwork)   with Dr. Harrington Challenger  Above was relayed to pt's spouse. CSW to fax pt's latest progress note and list of home medications to the clinic at [phone].  Spoke with [facility] RN regarding pt's disposition.  Sharren Bridge, MSW, [place] Work, Disposition  11/08/2014 [phone]

## 2014-11-08 NOTE — ED Provider Notes (Signed)
Pt seen by behavior health today and it was decided the pt could be safely treated as an out pt  Lawrence Ferguson, MD 11/08/14 1423

## 2014-11-08 NOTE — ED Notes (Signed)
Pt's home medication, lorazepam 0.5mg  given to pharmacy.

## 2014-11-08 NOTE — Consult Note (Signed)
Telepsych Consultation   Reason for Consult:  Depression Referring Physician:  EDP Patient Identification: Lawrence Bennett. MRN:  [id number] Principal Diagnosis: Major depressive disorder, recurrent episode, moderate Diagnosis:   Patient Active Problem List   Diagnosis Date Noted  . Major depressive disorder, recurrent episode, moderate [F33.1]     Priority: High  . Diverticulosis of colon without hemorrhage [K57.30] 05/02/2014  . Essential hypertension [I10] 04/15/2014  . Anxiety [F41.9] 04/15/2014  . Hearing loss [H91.90] 04/15/2014  . Dizziness [R42] 04/15/2014    Total Time spent with patient: 50 minutes  Subjective:   Lawrence Bennett. is a 59 y.o. male patient admitted with reports of irritability and depression and generalized suicidal thoughts. Pt seen and chart reviewed. Pt denies suicidal ideation and homicidal ideation, denies psychosis and does not appear to be responding to internal stimuli. Pt reports that he was "depressed and upset in general and I freaked out when the police grabbed me because I have never been arrested or had any trouble with the law". Collateral obtained from TTS staff indicates that pt's wife is not concerned that pt is a risk to self or others. Pt in agreement to seek followup treatment with resources provided by ED social worker.   HPI:  Lawrence Bennett. is an 59 y.o. male, the Patient reports calling the [facility] Dept because he wanted help for his depression and SI. Patient became combative during transport to [place] and had to be handcuffed. Patient would not answer questions or follow directions once in [facility]. Patient became combative to a point that he was given Geodon. Patient currently presenting irritable and reports hearing difficulty and would not answer several questions. Patient's Spouse, Lawrence Bennett, was present during the assessment. Patient presented orientated x3, mood "depressed, feeling tired, and sad,"  Patient denied current SI, HI, AV, and VH. Patient's Spouse reports during the past 2 weeks he has been in bed most of the day and stopped taking care of personal hygiene. She reports the Patient sleeps approximately 2 hours per night and sleeps during the day. She reports the Patient has lost 10 lbs over the past 2 months. Spouse reports the Patient has progressively deteriorated since retiring in November 2015. She reports the Patient has vision and hearing problems and the prognosis for improvement is poor. Spouse reports the family is having financial problems because 2 children are college. Spouse reports Patient is prescribed Zoloft by PCP for depression. She reports an appointment was scheduld with a outpatient mental health provider from their 11-02-2014 visit to [facility] and the Patient did not attend. Patient and Spouse denied any use of alcohol or any other drugs.   Past Medical History:  Past Medical History  Diagnosis Date  . Dyslipidemia   . Mild hypertension   . Anxiety   . Atrial contractions, premature   . Atherosclerosis of aorta   . Hyperlipidemia    History reviewed. No pertinent past surgical history. Family History:  Family History  Problem Relation Age of Onset  . Stroke Father   . Hypertension Sister    Social History:  History  Alcohol Use No     History  Drug Use No    History   Social History  . Marital Status: Married    Spouse Name: N/A  . Number of Children: N/A  . Years of Education: N/A   Social History Main Topics  . Smoking status: Current Every Day Smoker -- 0.20 packs/day for 19 years  Types: Cigarettes  . Smokeless tobacco: Never Used  . Alcohol Use: No  . Drug Use: No  . Sexual Activity: Not on file   Other Topics Concern  . None   Social History Narrative   Additional Social History:    Pain Medications: See PTA List Prescriptions: See PTA List Over the Counter: See PTA List History of alcohol / drug use?: No history  of alcohol / drug abuse                     Allergies:  No Known Allergies  Labs: No results found for this or any previous visit (from the past 67 hour(s)).  Vitals: Blood pressure 140/82, pulse 73, temperature 98.6 F (37 C), temperature source Oral, resp. rate 21, SpO2 98 %.  Risk to Self: Suicidal Ideation: No Suicidal Intent: No Is patient at risk for suicide?: No Suicidal Plan?: No Access to Means: No What has been your use of drugs/alcohol within the last 12 months?: None How many times?: 0 Other Self Harm Risks: Pt's spouses says he has hit his head on wall in frustration before Triggers for Past Attempts:  (n/a) Intentional Self Injurious Behavior: None Risk to Others: Homicidal Ideation: No Thoughts of Harm to Others: No Current Homicidal Intent: No Current Homicidal Plan: No Access to Homicidal Means: No History of harm to others?: No Assessment of Violence: None Noted Violent Behavior Description: Pt irritable but calm during assessment Does patient have access to weapons?: No Criminal Charges Pending?: No Does patient have a court date: No Prior Inpatient Therapy: Prior Inpatient Therapy: No Prior Therapy Dates: na Prior Therapy Facilty/Provider(s): na Reason for Treatment: na Prior Outpatient Therapy: Prior Outpatient Therapy: No Prior Therapy Dates: na Prior Therapy Facilty/Provider(s): na Reason for Treatment: na Does patient have an ACCT team?: No Does patient have Intensive In-House Services?  : No Does patient have [facility] services? : No Does patient have P4CC services?: No  No current facility-administered medications for this encounter.   Current Outpatient Prescriptions  Medication Sig Dispense Refill  . LORazepam (ATIVAN) 0.5 MG tablet Take 0.5 mg by mouth 3 (three) times daily.     Marland Kitchen losartan (COZAAR) 100 MG tablet Take 100 mg by mouth daily.    . sertraline (ZOLOFT) 50 MG tablet Take 50 mg by mouth daily.    . propranolol ER (INDERAL  LA) 60 MG 24 hr capsule Take 1 capsule (60 mg total) by mouth daily. (Patient not taking: Reported on 11/02/2014) 30 capsule 11   Facility-Administered Medications Ordered in Other Encounters  Medication Dose Route Frequency Provider Last Rate Last Dose  . LORazepam (ATIVAN) tablet 0.5 mg  0.5 mg Oral TID Milton Ferguson, MD   0.5 mg at 11/08/14 0901  . losartan (COZAAR) tablet 100 mg  100 mg Oral Daily Rolland Porter, MD   100 mg at 11/08/14 0837  . sertraline (ZOLOFT) tablet 50 mg  50 mg Oral Daily Milton Ferguson, MD   50 mg at 11/08/14 0901    Musculoskeletal: UTO, camera  Psychiatric Specialty Exam:     Blood pressure 140/82, pulse 73, temperature 98.6 F (37 C), temperature source Oral, resp. rate 21, SpO2 98 %.There is no weight on file to calculate BMI.  General Appearance: Casual and Fairly Groomed  Eye Contact::  Good  Speech:  Clear and Coherent and Normal Rate  Volume:  Normal  Mood:  Anxious  Affect:  Appropriate and Congruent  Thought Process:  Coherent and Goal  Directed  Orientation:  Full (Time, Place, and Person)  Thought Content:  WDL  Suicidal Thoughts:  No  Homicidal Thoughts:  No  Memory:  Immediate;   Good Recent;   Good Remote;   Good  Judgement:  Good  Insight:  Good  Psychomotor Activity:  Normal  Concentration:  Good  Recall:  Good  Fund of Knowledge:Good  Language: Good  Akathisia:  No  Handed:    AIMS (if indicated):     Assets:  Communication Skills Desire for Improvement Physical Health Resilience  ADL's:  Intact  Cognition: WNL  Sleep:      Medical Decision Making: Established Problem, Stable/Improving (1), Review of Psycho-Social Stressors (1) and Review or order clinical lab tests (1)   Treatment Plan Summary: Major depressive disorder, recurrent episode, moderate will be treated with followup counseling appointments and possible medication intervention by the new provider.    No evidence of imminent risk to self or others at present.    Patient does not meet criteria for psychiatric inpatient admission. Supportive therapy provided about ongoing stressors. Refer to IOP. Discussed crisis plan, support from social network, calling 911, coming to the Emergency Department, and calling Suicide Hotline.  Disposition:  -Discharge home -Provide outpatient resources/referrals for psychiatry counseling for depression   Benjamine Mola, FNP-BC 11/08/2014 11:12 AM      Case discussed with me as above   Neita Garnet , MD

## 2014-11-08 NOTE — Discharge Instructions (Signed)
Follow up with out pt tx for depression as instructed by behavior health worker

## 2014-11-08 NOTE — Progress Notes (Signed)
CSW spoke with pt's wife via phone. Wife states pt has become increasingly depressed since 06/2014. States pt retired with hopes of receiving disability d/t hearding loss, however "disability fell through and he became depressed over the months." States as they were looking into making outpatient appointments, they found first available appointment as of a couple of weeks ago was 12/21/14. States someone recommended he come to [facility] psych ED if they thought sooner assessment was needed, which they did and pt was seen and cleared for d/c with outpatient follow up. (see documentation from 11/02/14.) Wife reports over the weekend pt called 911 asking for help for depression while she was out of home. States pt went to ED voluntarily and "according to the [facility] before going inside said something like he wouldn't mind just dying, but that's the first he has said anything like that. He said later it was a cry for help and not a serious statement. He said he would not want to die because he loves his family and wants to live for me and the kids." She states she has no concerns of pt being a danger to his or others' safety. States, "I decided one way or the other we would set up those appointments today." States she was under impression pt was accepted to [facility] last night. CSW clarified that pt is being seen by telepsychiatry this morning and following results of that interview, either placement will be sought or appropriate d/c resources will be provided.  Wife agrees with this plan and states she plans to visit pt in ED at 12pm today and in meantime can be reached at [phone].  Sharren Bridge, MSW, [place] Work, Disposition  11/08/2014 [phone]

## 2014-11-08 NOTE — ED Notes (Signed)
Spoke with Meagan at [facility], per her patient has been cleared for discharge by MD. States she has been in contact with the wife regarding discharge and setting up follow up treatment. Patients wife to be called at work if needed to pick patient up

## 2014-11-10 ENCOUNTER — Emergency Department (HOSPITAL_COMMUNITY)
Admission: EM | Admit: 2014-11-10 | Discharge: 2014-11-11 | Disposition: A | Discharge status: Other Acute Inpatient Hospital | Payer: 59 | Attending: Emergency Medicine | Admitting: Emergency Medicine

## 2014-11-10 ENCOUNTER — Emergency Department (HOSPITAL_COMMUNITY): Payer: 59

## 2014-11-10 ENCOUNTER — Encounter (HOSPITAL_COMMUNITY): Payer: Self-pay | Admitting: Emergency Medicine

## 2014-11-10 DIAGNOSIS — F419 Anxiety disorder, unspecified: Secondary | ICD-10-CM | POA: Insufficient documentation

## 2014-11-10 DIAGNOSIS — X58XXXA Exposure to other specified factors, initial encounter: Secondary | ICD-10-CM | POA: Insufficient documentation

## 2014-11-10 DIAGNOSIS — Z720 Tobacco use: Secondary | ICD-10-CM | POA: Diagnosis not present

## 2014-11-10 DIAGNOSIS — S5011XA Contusion of right forearm, initial encounter: Secondary | ICD-10-CM | POA: Insufficient documentation

## 2014-11-10 DIAGNOSIS — Z8639 Personal history of other endocrine, nutritional and metabolic disease: Secondary | ICD-10-CM | POA: Diagnosis not present

## 2014-11-10 DIAGNOSIS — S41101A Unspecified open wound of right upper arm, initial encounter: Secondary | ICD-10-CM | POA: Insufficient documentation

## 2014-11-10 DIAGNOSIS — Y998 Other external cause status: Secondary | ICD-10-CM | POA: Insufficient documentation

## 2014-11-10 DIAGNOSIS — Z046 Encounter for general psychiatric examination, requested by authority: Secondary | ICD-10-CM | POA: Diagnosis present

## 2014-11-10 DIAGNOSIS — I10 Essential (primary) hypertension: Secondary | ICD-10-CM | POA: Diagnosis not present

## 2014-11-10 DIAGNOSIS — Y9389 Activity, other specified: Secondary | ICD-10-CM | POA: Insufficient documentation

## 2014-11-10 DIAGNOSIS — Z79899 Other long term (current) drug therapy: Secondary | ICD-10-CM | POA: Diagnosis not present

## 2014-11-10 DIAGNOSIS — F332 Major depressive disorder, recurrent severe without psychotic features: Secondary | ICD-10-CM | POA: Diagnosis not present

## 2014-11-10 DIAGNOSIS — Y9289 Other specified places as the place of occurrence of the external cause: Secondary | ICD-10-CM | POA: Insufficient documentation

## 2014-11-10 LAB — CBC WITH DIFFERENTIAL/PLATELET
Basophils Absolute: 0 10*3/uL (ref 0.0–0.1)
Basophils Relative: 0 % (ref 0–1)
EOS ABS: 0 10*3/uL (ref 0.0–0.7)
EOS PCT: 0 % (ref 0–5)
HEMATOCRIT: 43.2 % (ref 39.0–52.0)
Hemoglobin: 15 g/dL (ref 13.0–17.0)
LYMPHS ABS: 1.2 10*3/uL (ref 0.7–4.0)
LYMPHS PCT: 13 % (ref 12–46)
MCH: 31 pg (ref 26.0–34.0)
MCHC: 34.7 g/dL (ref 30.0–36.0)
MCV: 89.3 fL (ref 78.0–100.0)
MONO ABS: 0.5 10*3/uL (ref 0.1–1.0)
Monocytes Relative: 6 % (ref 3–12)
Neutro Abs: 7.5 10*3/uL (ref 1.7–7.7)
Neutrophils Relative %: 81 % — ABNORMAL HIGH (ref 43–77)
Platelets: 279 10*3/uL (ref 150–400)
RBC: 4.84 MIL/uL (ref 4.22–5.81)
RDW: 13.3 % (ref 11.5–15.5)
WBC: 9.3 10*3/uL (ref 4.0–10.5)

## 2014-11-10 LAB — ETHANOL: Alcohol, Ethyl (B): <5 mg/dL (ref <5)

## 2014-11-10 LAB — ACETAMINOPHEN LEVEL: Acetaminophen (Tylenol), Serum: <10 ug/mL — ABNORMAL LOW (ref 10–30)

## 2014-11-10 LAB — COMPREHENSIVE METABOLIC PANEL
ALBUMIN: 4 g/dL (ref 3.5–5.0)
ALT: 19 U/L (ref 17–63)
AST: 24 U/L (ref 15–41)
Alkaline Phosphatase: 68 U/L (ref 38–126)
Anion gap: 11 (ref 5–15)
BUN: 21 mg/dL — AB (ref 6–20)
CHLORIDE: 104 mmol/L (ref 101–111)
CO2: 22 mmol/L (ref 22–32)
CREATININE: 0.98 mg/dL (ref 0.61–1.24)
Calcium: 9.5 mg/dL (ref 8.9–10.3)
Glucose, Bld: 122 mg/dL — ABNORMAL HIGH (ref 70–99)
Potassium: 4 mmol/L (ref 3.5–5.1)
Sodium: 137 mmol/L (ref 135–145)
TOTAL PROTEIN: 6.8 g/dL (ref 6.5–8.1)
Total Bilirubin: 0.9 mg/dL (ref 0.3–1.2)

## 2014-11-10 LAB — SALICYLATE LEVEL: Salicylate Lvl: <4 mg/dL (ref 2.8–30.0)

## 2014-11-10 LAB — TROPONIN I

## 2014-11-10 LAB — CK: CK TOTAL: 106 U/L (ref 49–397)

## 2014-11-10 MED ORDER — ONDANSETRON HCL 4 MG PO TABS: 4.0000 mg | ORAL_TABLET | Freq: Three times a day (TID) | ORAL | Status: DC | PRN

## 2014-11-10 MED ORDER — LOSARTAN POTASSIUM 50 MG PO TABS
100.0000 mg | ORAL_TABLET | Freq: Every day | ORAL | Status: DC
  Administered 2014-11-11: 100 mg via ORAL
  Filled 2014-11-10 (×3): qty 2

## 2014-11-10 MED ORDER — ZIPRASIDONE MESYLATE 20 MG IM SOLR
INTRAMUSCULAR | Status: AC
  Filled 2014-11-10: qty 20

## 2014-11-10 MED ORDER — ZIPRASIDONE MESYLATE 20 MG IM SOLR
10.0000 mg | Freq: Once | INTRAMUSCULAR | Status: AC
  Administered 2014-11-10: 10 mg via INTRAMUSCULAR

## 2014-11-10 MED ORDER — ACETAMINOPHEN 325 MG PO TABS: 650.0000 mg | ORAL_TABLET | ORAL | Status: DC | PRN

## 2014-11-10 MED ORDER — LORAZEPAM 0.5 MG PO TABS
0.5000 mg | ORAL_TABLET | Freq: Three times a day (TID) | ORAL | Status: DC
Start: 2014-11-10 — End: 2014-11-11
  Administered 2014-11-11: 0.5 mg via ORAL
  Filled 2014-11-10 (×2): qty 1

## 2014-11-10 MED ORDER — STERILE WATER FOR INJECTION IJ SOLN
INTRAMUSCULAR | Status: AC
  Administered 2014-11-10: 22:00:00
  Filled 2014-11-10: qty 10

## 2014-11-10 MED ORDER — SERTRALINE HCL 50 MG PO TABS
50.0000 mg | ORAL_TABLET | Freq: Every day | ORAL | Status: DC
  Administered 2014-11-10 – 2014-11-11 (×2): 50 mg via ORAL
  Filled 2014-11-10 (×2): qty 1

## 2014-11-10 MED ORDER — PROPRANOLOL HCL ER 60 MG PO CP24
60.0000 mg | ORAL_CAPSULE | Freq: Every day | ORAL | Status: DC
  Administered 2014-11-11: 60 mg via ORAL
  Filled 2014-11-10 (×3): qty 1

## 2014-11-10 NOTE — ED Notes (Signed)
Pt restrained by [employer].

## 2014-11-10 NOTE — ED Notes (Signed)
Patient calm in bed. Shackles hands released from cuffs.

## 2014-11-10 NOTE — ED Notes (Signed)
Pt states he has head problems. States never has been this bad, and medications are not working. Has not had Zoloft in the past 3 days per patient.

## 2014-11-10 NOTE — ED Notes (Signed)
Pt refusing to take oral medication, pt offered either oral or injection. Pt states the medication is no good. Pt repeating to get him out of here, pulling at his hair. [employer] in room keeping pt here. EDP advised.

## 2014-11-10 NOTE — Progress Notes (Addendum)
Patient is under review at [facility].  Patient has been referred for IP treatment at: [facility] - per Gerald Stabs, fax referral for tomorrow d/cs. [facility] per Jana Half, have a couple geriatric beds, fax referral. [facility] - per Karna Christmas, at capacity tonight, but fax referral. [facility] - per Denny Peon, no beds but fax referral for d/c tomorrow.   [facility] per Erasmo Downer, fax referrral, but at capacity tonight.  At capacity: [facility] but call back in am, 2 d/c tomorrow. [facility] - per [place], possibly 2 d/c tomorrow, call back in am.  CSW will continue to seek placement.  Verlon Setting, [place] Disposition staff 11/10/2014 10:13 PM

## 2014-11-10 NOTE — ED Provider Notes (Signed)
CSN: [id number]     Arrival date & time 11/10/14  1430 History   First MD Initiated Contact with Patient 11/10/14 1439     Chief Complaint  Patient presents with  . V70.1     (Consider location/radiation/quality/duration/timing/severity/associated sxs/prior Treatment) HPI Comments: Level V caveat for altered mental status. Patient brought in by police after reportedly expressing suicidal ideation. He is screaming and agitated and not giving any history. Patient lying on a fetal position moaning and screaming and not making any comprehensible words. Chart review shows similar visit 2 days ago. No evidence of trauma.men  The history is provided by the patient and the EMS personnel. The history is limited by the condition of the patient.    Past Medical History  Diagnosis Date  . Dyslipidemia   . Mild hypertension   . Anxiety   . Atrial contractions, premature   . Atherosclerosis of aorta   . Hyperlipidemia    History reviewed. No pertinent past surgical history. Family History  Problem Relation Age of Onset  . Stroke Father   . Hypertension Sister    History  Substance Use Topics  . Smoking status: Current Every Day Smoker -- 0.20 packs/day for 19 years    Types: Cigarettes  . Smokeless tobacco: Never Used  . Alcohol Use: No    Review of Systems  Unable to perform ROS     Allergies  Review of patient's allergies indicates no known allergies.  Home Medications   Prior to Admission medications   Medication Sig Start Date End Date Taking? Authorizing Provider  LORazepam (ATIVAN) 0.5 MG tablet Take 0.5 mg by mouth 3 (three) times daily.    Yes Historical Provider, MD  losartan (COZAAR) 100 MG tablet Take 100 mg by mouth daily.   Yes Historical Provider, MD  sertraline (ZOLOFT) 50 MG tablet Take 50 mg by mouth daily.   Yes Historical Provider, MD  propranolol ER (INDERAL LA) 60 MG 24 hr capsule Take 1 capsule (60 mg total) by mouth daily. Patient not taking: Reported  on 11/02/2014 04/15/14   [name], MD   BP 143/96 mmHg  Pulse 80  Temp(Src) 97.5 F (36.4 C) (Oral)  Resp 18  Wt 165 lb (74.844 kg)  SpO2 99% Physical Exam  Constitutional: He appears well-developed and well-nourished. He appears distressed.  HENT:  Head: Normocephalic and atraumatic.  Mouth/Throat: Oropharynx is clear and moist.  Eyes: Conjunctivae and EOM are normal. Pupils are equal, round, and reactive to light.  Neck: Normal range of motion. Neck supple.  No meningismus  Cardiovascular: Normal rate, regular rhythm, normal heart sounds and intact distal pulses.   No murmur heard. Pulmonary/Chest: Effort normal and breath sounds normal. No respiratory distress.  Abdominal: Soft. There is no tenderness. There is no rebound and no guarding.  Musculoskeletal: Normal range of motion. He exhibits no edema or tenderness.  Skin tear R arm.  Ecchymosis R forearm Intact radial pulses  Neurological: He is alert.  Agitated, moaning, lying in fetal position, noncommunicative, not answering questions or following commands  2-3 beats ankle clonus bilaterally Moving all extremities  Skin: Skin is warm.    ED Course  Procedures (including critical care time) Labs Review Labs Reviewed  CBC WITH DIFFERENTIAL/PLATELET - Abnormal; Notable for the following:    Neutrophils Relative % 81 (*)    All other components within normal limits  COMPREHENSIVE METABOLIC PANEL - Abnormal; Notable for the following:    Glucose, Bld 122 (*)  BUN 21 (*)    All other components within normal limits  ACETAMINOPHEN LEVEL - Abnormal; Notable for the following:    Acetaminophen (Tylenol), Serum <10 (*)    All other components within normal limits  ETHANOL  SALICYLATE LEVEL  TROPONIN I  CK  URINE RAPID DRUG SCREEN (HOSP PERFORMED)    Imaging Review Ct Head Wo Contrast  11/10/2014   CLINICAL DATA:  Altered mental status.  Hypertension.  Dizziness.  EXAM: CT HEAD WITHOUT CONTRAST  TECHNIQUE:  Contiguous axial images were obtained from the base of the skull through the vertex without intravenous contrast.  COMPARISON:  Brain MRI on 05/13/2014  FINDINGS: There is no evidence of intracranial hemorrhage, brain edema, or other signs of acute infarction. There is no evidence of intracranial mass lesion or mass effect. No abnormal extraaxial fluid collections are identified.  Bold lacunar infarcts are again seen involving the left basal ganglia and anterior limb of internal capsule. Ventricles are normal in size. No skull abnormality identified.  IMPRESSION: No acute intracranial findings.  Old left basal ganglia lacunar infarcts.   Electronically Signed   By: Earle Gell M.D.   On: 11/10/2014 17:06     EKG Interpretation   Date/Time:  Wednesday Nov 10 2014 16:34:51 EDT Ventricular Rate:  78 PR Interval:  106 QRS Duration: 100 QT Interval:  376 QTC Calculation: 428 R Axis:   79 Text Interpretation:  Sinus rhythm with short PR with occasional Premature  ventricular complexes Otherwise normal ECG Rate faster Confirmed by  [place] [phone]) on 11/10/2014 4:56:06 PM      MDM   Final diagnoses:  Major depressive disorder, recurrent, severe without psychotic features    patient from home with police under IVC commitment filed by family. They endorse his been depressed with thoughts of suicide and not taking care of his daily needs.   Patient given Geodon on arrival for screaming and uncooperation.  He is yelling and using profanity.   On reassessment, patient is improved and his cursing has subsided.   screening labs unremarkable. CT head negative.  Holding orders placed.  Patient has calmed after geodon and has periods of lucidity as well as periods of agitation.  Psychiatry feels he needs inpatient treatment and is seeking placement.  Ezequiel Essex, MD 11/11/14 [phone]

## 2014-11-10 NOTE — ED Notes (Signed)
Seizure pads placed on stretcher to protect patient legs/head from patient hitting them on stretcher.

## 2014-11-10 NOTE — ED Notes (Signed)
Patient to CT with [employer] officer

## 2014-11-10 NOTE — ED Notes (Signed)
Tele-psych set up for patient assessment. Patient moved to room 14 temporarily for assessment.

## 2014-11-10 NOTE — ED Notes (Signed)
Pt states he is starting to feel real anxious & gitter again.

## 2014-11-10 NOTE — BH Assessment (Addendum)
Tele Assessment Note   Lawrence Bennett is an 59 y.o. male was brought in by the police under involuntary commitment. Pt lives with his wife and child. Pt reports that he has not been taking care of his daily needs like bathing, putting on clean clothes and doesn't get out of bed in the morning. Pt reports that he has felt depressed for years and has been feeling suicidal for a while. Pt reports that he has no plan to commit suicide and doesn't feel he could go through with it but would like to be dead. Pt reports that he does have hunting riffles in the house. Pt reports depressed symptoms including isolation, depressed mood, insomnia, worthlessness, guilt, and irritability. Pt reports that he has not been eating due to an GI issue. Pt denies any SA, A/V Hallucinations, HI, abuse of any kind and has not history of treatment. Pt reports stressors including retirement, financial stress, and relational stress with his family. Per Dr. Merian Capron, pt meets inpatient criteria. Placement will be sought for pt.    Axis I: F32.2 Major depressive disorder, single episode, severe Axis II: Deferred Axis III:  Past Medical History  Diagnosis Date  . Dyslipidemia   . Mild hypertension   . Anxiety   . Atrial contractions, premature   . Atherosclerosis of aorta   . Hyperlipidemia    Axis IV: economic problems, occupational problems, other psychosocial or environmental problems and problems with access to health care services Axis V: 21-30 behavior considerably influenced by delusions or hallucinations OR serious impairment in judgment, communication OR inability to function in almost all areas  Past Medical History:  Past Medical History  Diagnosis Date  . Dyslipidemia   . Mild hypertension   . Anxiety   . Atrial contractions, premature   . Atherosclerosis of aorta   . Hyperlipidemia     History reviewed. No pertinent past surgical history.  Family History:  Family History  Problem Relation  Age of Onset  . Stroke Father   . Hypertension Sister     Social History:  reports that he has been smoking Cigarettes.  He has a 3.8 pack-year smoking history. He has never used smokeless tobacco. He reports that he does not drink alcohol or use illicit drugs.  Additional Social History:  Alcohol / Drug Use Pain Medications: pt denies Prescriptions: pt denies Over the Counter: pt denies History of alcohol / drug use?: No history of alcohol / drug abuse  CIWA: CIWA-Ar BP: 143/96 mmHg Pulse Rate: 80 COWS:    PATIENT STRENGTHS: (choose at least two) Communication skills Physical Health Work skills  Allergies: No Known Allergies  Home Medications:  (Not in a hospital admission)  OB/GYN Status:  No LMP for male patient.  General Assessment Data [facility] of Assessment: [facility] TTS Assessment: In system Is this a Tele or Face-to-Face Assessment?: Tele Assessment Is this an Initial Assessment or a Re-assessment for this encounter?: Initial Assessment Marital status: Married Is patient pregnant?: No Pregnancy Status: No Living Arrangements: Spouse/significant other, Children Can pt return to current living arrangement?: Yes Admission Status: Involuntary Is patient capable of signing voluntary admission?: Yes Referral Source: Other (police)  Medical Screening Exam ([place]) Medical Exam completed: Yes  Crisis Care Plan Living Arrangements: Spouse/significant other, Children  Education Status Is patient currently in school?: No Highest grade of school patient has completed: 12  Risk to self with the past 6 months Suicidal Ideation: Yes-Currently Present Has patient been a risk  to self within the past 6 months prior to admission? : Yes Suicidal Intent: No Has patient had any suicidal intent within the past 6 months prior to admission? : No Is patient at risk for suicide?: Yes Suicidal Plan?: No Has patient had any suicidal plan within the past 6 months prior  to admission? : No What has been your use of drugs/alcohol within the last 12 months?: none Previous Attempts/Gestures: No Triggers for Past Attempts: None known Intentional Self Injurious Behavior: None Family Suicide History: No Recent stressful life event(s): Job Loss, Other (Comment) (retired in 11/15, finacial stress, relationship stress) Persecutory voices/beliefs?: No Depression: Yes Depression Symptoms: Despondent, Insomnia, Tearfulness, Isolating, Guilt, Feeling worthless/self pity, Feeling angry/irritable, Loss of interest in usual pleasures Substance abuse history and/or treatment for substance abuse?: No Suicide prevention information given to non-admitted patients: Not applicable  Risk to Others within the past 6 months Homicidal Ideation: No Does patient have any lifetime risk of violence toward others beyond the six months prior to admission? : No Thoughts of Harm to Others: No Current Homicidal Intent: No Current Homicidal Plan: No Access to Homicidal Means: No History of harm to others?: No Assessment of Violence: None Noted Does patient have access to weapons?: Yes (Comment) (hunting riffles) Criminal Charges Pending?: No Does patient have a court date: No Is patient on probation?: No  Psychosis Hallucinations: None noted Delusions: None noted  Mental Status Report Appearance/Hygiene: Unremarkable Eye Contact: Poor Motor Activity: Freedom of movement, Unremarkable Speech: Soft, Slow Level of Consciousness: Quiet/awake Mood: Depressed, Helpless, Irritable, Ashamed/humiliated Affect: Depressed, Irritable Anxiety Level: Panic Attacks Panic attack frequency: daily Most recent panic attack: 11/10/14 Thought Processes: Coherent, Relevant Judgement: Impaired Orientation: Person, Place, Time, Situation Obsessive Compulsive Thoughts/Behaviors: None  Cognitive Functioning Concentration: Decreased Memory: Remote Intact, Recent Intact IQ: Average Insight:  Poor Impulse Control: Fair Appetite: Poor Weight Loss: 10 Sleep: Decreased Total Hours of Sleep: 0 Vegetative Symptoms: Staying in bed, Decreased grooming, Not bathing  ADLScreening [facility] Assessment Services) Patient's cognitive ability adequate to safely complete daily activities?: Yes Patient able to express need for assistance with ADLs?: Yes Independently performs ADLs?: Yes (appropriate for developmental age)  Prior Inpatient Therapy Prior Inpatient Therapy: No Prior Therapy Dates: na Prior Therapy Facilty/Provider(s): na Reason for Treatment: na  Prior Outpatient Therapy Prior Outpatient Therapy: No Prior Therapy Dates: na Prior Therapy Facilty/Provider(s): na Reason for Treatment: na Does patient have an ACCT team?: No Does patient have Intensive In-House Services?  : No Does patient have [facility] services? : No Does patient have P4CC services?: No  ADL Screening (condition at time of admission) Patient's cognitive ability adequate to safely complete daily activities?: Yes Is the patient deaf or have difficulty hearing?: Yes (needs hearing aid) Does the patient have difficulty seeing, even when wearing glasses/contacts?: Yes Does the patient have difficulty concentrating, remembering, or making decisions?: Yes Patient able to express need for assistance with ADLs?: Yes Does the patient have difficulty dressing or bathing?: No Independently performs ADLs?: Yes (appropriate for developmental age) Does the patient have difficulty walking or climbing stairs?: No       Abuse/Neglect Assessment (Assessment to be complete while patient is alone) Physical Abuse: Denies Verbal Abuse: Denies Sexual Abuse: Denies Exploitation of patient/patient's resources: Denies Self-Neglect: Yes, present (Comment) Values / Beliefs Cultural Requests During Hospitalization: None Spiritual Requests During Hospitalization: None Consults Spiritual Care Consult Needed: No Social Work  Consult Needed: No Regulatory affairs officer (For Healthcare) Does patient have an advance directive?: No Would patient  like information on creating an advanced directive?: No - patient declined information    Additional Information 1:1 In Past 12 Months?: No CIRT Risk: Yes Elopement Risk: No Does patient have medical clearance?: Yes     Disposition:  Disposition Initial Assessment Completed for this Encounter: Yes Disposition of Patient: Inpatient treatment program Type of inpatient treatment program: Adult Type of outpatient treatment: Adult  Ruben Reason, MA, [facility], [facility]   11/10/2014 5:55 PM

## 2014-11-10 NOTE — ED Notes (Signed)
Patient arrives via [facility] with c/o agitation, cursing, and yelling. Patient will not answer questions. Using repetitive phrases (cursing). Aggressive with PD.

## 2014-11-11 NOTE — ED Notes (Signed)
Pt resting calmly at present, pt remains in shackles w/ [facility] siting outside the room.

## 2014-11-11 NOTE — ED Provider Notes (Signed)
Pt has been accepted to [facility]. Will transfer stable.   Francine Graven, DO 11/11/14 1144

## 2014-11-11 NOTE — Progress Notes (Signed)
CSW was informed during shift report that pt has been accepted to [facility] per [facility] intake RN, Anderson Malta, by Dr. Ronnald Ramp, and that pt could be transported after 12:00 11/11/14. Was told [facility] needed copies of IVC paperwork.  As CSW was not able to locate any information in pt's chart verifying this report, this Probation officer contacted [facility] to verify. Was told Pt 's acceptance is pending receiving copies of IVC paperwork and the above accepting information was a miscommunication. Intake staff states IVC copies are needed prior to being able to extend official acceptance.  CSW spoke with [facility] charge RN and requested copies of IVC paperwork be faxed to this writer at [phone]. When copies are received, CSW will fax them to [facility] and follow up.   Sharren Bridge, MSW, [place] Clinical Social Work, Disposition  11/11/2014 [phone]

## 2014-11-11 NOTE — Progress Notes (Signed)
Received fax from [facility] containing copies of IVC paperwork. Attempted to fax to [facility] at [phone] and [phone]. Shirlee Limerick at [facility] says fax is not received. Attempted several more times unsuccessfully. Shirlee Limerick states they will be able to offer a bed pending review of the paperwork.  Called [facility] charge RN and asked to have fax attempted at numbers above. Follow up with this writer at [phone].  Sharren Bridge, MSW, [place] Clinical Social Work, Disposition  11/11/2014 [phone]

## 2014-11-11 NOTE — ED Notes (Signed)
Report called to Levada Dy, Therapist, sports at [facility]. [facility] here for [facility] to jail and then to behavioral health in [place]

## 2014-11-11 NOTE — Progress Notes (Signed)
Shirlee Limerick at [facility] states IVC copies were received. Pt accepted by Dr. Ronnald Ramp, report [phone].  Spoke with [facility] RN Bethena Roys regarding pt's placement.  Sharren Bridge, MSW, [place] Clinical Social Work, Disposition  11/11/2014 [phone]

## 2014-11-11 NOTE — ED Notes (Signed)
PT wife called and spoke with pt and was informed pt to be transferred to [facility] today.

## 2014-11-12 ENCOUNTER — Ambulatory Visit (HOSPITAL_COMMUNITY): Payer: 59 | Admitting: Psychiatry

## 2014-11-16 ENCOUNTER — Ambulatory Visit (HOSPITAL_COMMUNITY): Payer: 59 | Admitting: Psychology

## 2014-12-16 ENCOUNTER — Ambulatory Visit (HOSPITAL_COMMUNITY): Payer: 59 | Admitting: Psychiatry

## 2014-12-20 ENCOUNTER — Telehealth (HOSPITAL_COMMUNITY): Payer: Self-pay | Admitting: *Deleted

## 2014-12-20 NOTE — Telephone Encounter (Signed)
Called pt mobile and identified myself and not the facility and spoke with pt wife. Tried to leave message for pt to call back and pt wife stated will not call office back. Per wife she's the one that made the appt and did the admitting to the hospital. Informed pt wife that due to privacy laws, I was unable to speak with her. Pt wife was not happy about that and stated that she's the one that handles his appts.  Tried to inform pt again about the privacy laws and she was not happy about that and hung up.

## 2015-01-11 ENCOUNTER — Ambulatory Visit (INDEPENDENT_AMBULATORY_CARE_PROVIDER_SITE_OTHER): Payer: 59 | Admitting: Psychiatry

## 2015-01-11 ENCOUNTER — Encounter (HOSPITAL_COMMUNITY): Payer: Self-pay | Admitting: Psychiatry

## 2015-01-11 VITALS — BP 130/90 | Ht 71.0 in | Wt 162.0 lb

## 2015-01-11 DIAGNOSIS — F329 Major depressive disorder, single episode, unspecified: Secondary | ICD-10-CM

## 2015-01-11 DIAGNOSIS — F32A Depression, unspecified: Secondary | ICD-10-CM

## 2015-01-11 MED ORDER — VENLAFAXINE HCL ER 75 MG PO CP24: 75.0000 mg | ORAL_CAPSULE | Freq: Every day | ORAL | Status: DC

## 2015-01-11 MED ORDER — DIVALPROEX SODIUM ER 500 MG PO TB24: 500.0000 mg | ORAL_TABLET | Freq: Every day | ORAL | Status: DC

## 2015-01-11 MED ORDER — QUETIAPINE FUMARATE 100 MG PO TABS: 100.0000 mg | ORAL_TABLET | Freq: Every day | ORAL | Status: DC

## 2015-01-11 MED ORDER — TRAZODONE HCL 50 MG PO TABS: 50.0000 mg | ORAL_TABLET | Freq: Every day | ORAL | Status: DC

## 2015-01-11 MED ORDER — LORAZEPAM 0.5 MG PO TABS: 0.5000 mg | ORAL_TABLET | Freq: Three times a day (TID) | ORAL | Status: DC

## 2015-01-11 MED ORDER — PAROXETINE HCL 20 MG PO TABS: 20.0000 mg | ORAL_TABLET | Freq: Every day | ORAL | Status: DC

## 2015-01-11 NOTE — Progress Notes (Signed)
Psychiatric Initial Adult Assessment   Patient Identification: Lawrence Bennett MRN:  [id number] Date of Evaluation:  01/11/2015 Referral Source: [facility] Chief Complaint:   Chief Complaint    Depression; Anxiety; Establish Care     Visit Diagnosis:    ICD-9-CM ICD-10-CM   1. Depression 311 F32.9 Valproic Acid level   Diagnosis:   Patient Active Problem List   Diagnosis Date Noted  . Major depressive disorder, recurrent episode, moderate [F33.1]   . Diverticulosis of colon without hemorrhage [K57.30] 05/02/2014  . Essential hypertension [I10] 04/15/2014  . Anxiety [F41.9] 04/15/2014  . Hearing loss [H91.90] 04/15/2014  . Dizziness [R42] 04/15/2014   History of Present Illness: This patient is a 59 year old married white male who lives with his wife and 3 children ages 79,15 and 62 in 24. 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  The patient presents with his wife today. He is very hard of hearing and it's difficult to get a good history from him but she filled in the gaps. She states that he's had depression for at least one year. He started worrying about his job being discontinued or that [name] might close about a year ago. He finally decided to retire early in November. He also had some medical problems going on such as loss of vision and loss of hearing. In 2012 he had colon resection for diverticulosis.  Since retiring in November his mental status has declined. He started staying in bed all the time being unable to function not attending to his ADLs. He stopped eating and lost about 10 pounds. He was not interacting with anyone at all. His primary doctor had put him on Zoloft but it was not helpful. He had been on a low dose of Ativan for years as well. Finally in the end of May his wife brought him to [name] ED for evaluation and from there he was transferred to [facility]  geropsychiatry unit.  While on the unit he was diagnosed with severe depression. He was started on a number of medicines including Paxil and Effexor, Seroquel trazodone and Depakote. He also had 4 treatments of ECT while in the hospital and to as an outpatient. His last one was in early June.  Since getting out of the hospital he seems to be doing somewhat better. He is getting out of bed every day and doing things around the house and yard. His family went on a trip to a [place] and he refused to go with them. States that he's had a lot of short-term memory loss since having ECT but his wife thinks his mood is much better. He's never had psychotic symptoms such as auditory or visual hallucinations or paranoia but his thoughts were somewhat disorganized. He has never been suicidal or homicidal. He does not drink or use drugs. His wife thinks that he slowly getting better but he still not the person he used to be. He complains of dry mouth secondary to all the medications. His Depakote level has not been checked since he left the hospital Elements:  Location:  Global. Quality:  Severe. Severity:  Severe. Timing:  Subchronic. Duration:  Approximately one year. Context:  Medical issues, retirement from work. Associated Signs/Symptoms: Depression Symptoms:  depressed mood, anhedonia, psychomotor retardation, feelings of worthlessness/guilt, difficulty concentrating, anxiety, panic attacks, loss of energy/fatigue, weight loss,  Anxiety Symptoms:  Excessive Worry, Panic Symptoms, Social Anxiety,  Past Medical History:  Past Medical History  Diagnosis Date  . Dyslipidemia   . Mild hypertension   . Anxiety   . Atrial contractions, premature   . Atherosclerosis of aorta   . Hyperlipidemia   . Depression   . Urinary hesitancy     Past Surgical History  Procedure Laterality Date  . Colon resection     Family History:  Family History  Problem Relation Age of Onset  . Stroke  Father   . Bipolar disorder Father   . Hypertension Sister   . Depression Sister    Social History:   History   Social History  . Marital Status: Married    Spouse Name: N/A  . Number of Children: N/A  . Years of Education: N/A   Social History Main Topics  . Smoking status: Former Smoker -- 0.20 packs/day for 19 years    Types: Cigarettes  . Smokeless tobacco: Never Used  . Alcohol Use: No  . Drug Use: No  . Sexual Activity: Not on file   Other Topics Concern  . None   Social History Narrative   Additional Social History: Patient grew up in [place]. He has 3 sisters. He states that he had a great childhood with no history of abuse or trauma. He finished high school and has one year of college. 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 He has 3 children ages 57 and 62 and his oldest daughter attends [employer]. He is concerned about finances given that he is paying for his daughter's college.  Musculoskeletal: Strength & Muscle Tone: within normal limits Gait & Station: normal Patient leans: N/A  Psychiatric Specialty Exam: HPI  Review of Systems  Constitutional: Positive for weight loss.  HENT: Positive for hearing loss.   Eyes: Positive for blurred vision.  Psychiatric/Behavioral: Positive for depression and memory loss. The patient is nervous/anxious.     Blood pressure 130/90, height 5\' 11"  (1.803 m), weight 162 lb (73.483 kg).Body mass index is 22.6 kg/(m^2).  General Appearance: Casual, Neat and Well Groomed  Eye Contact:  Good  Speech:  Slow  Volume:  Decreased  Mood:  Dysphoric  Affect:  Blunt and Constricted  Thought Process:  Goal Directed  Orientation:  Full (Time, Place, and Person)  Thought Content:  Rumination  Suicidal Thoughts:  No  Homicidal Thoughts:  No  Memory:  Immediate;   Poor Recent;   Fair Remote;   Fair  Judgement:  Impaired  Insight:  Lacking  Psychomotor Activity:  Decreased  Concentration:   Fair  Recall:  Poor  Fund of Knowledge:Good  Language: Good  Akathisia:  No  Handed:  Right  AIMS (if indicated):    Assets:  Communication Skills Desire for Improvement Resilience Social Support Talents/Skills  ADL's:  Intact  Cognition: WNL  Sleep:     Is the patient at risk to self?  No. Has the patient been a risk to self in the past 6 months?  No. Has the patient been a risk to self within the distant past?  No. Is the patient a risk to others?  No. Has the patient been a risk to others in the past 6 months?  No. Has the patient been a risk to others within the distant past?  No.  Allergies:  No Known Allergies Current Medications: Current Outpatient Prescriptions  Medication Sig Dispense Refill  . amLODipine (NORVASC) 2.5 MG tablet     .  divalproex (DEPAKOTE ER) 500 MG 24 hr tablet Take 1 tablet (500 mg total) by mouth at bedtime. 30 tablet 2  . LORazepam (ATIVAN) 0.5 MG tablet Take 1 tablet (0.5 mg total) by mouth 3 (three) times daily. 90 tablet 2  . PARoxetine (PAXIL) 20 MG tablet Take 1 tablet (20 mg total) by mouth at bedtime. 30 tablet 2  . QUEtiapine (SEROQUEL) 100 MG tablet Take 1 tablet (100 mg total) by mouth at bedtime. 30 tablet 2  . traZODone (DESYREL) 50 MG tablet Take 1 tablet (50 mg total) by mouth at bedtime. 30 tablet 2  . venlafaxine XR (EFFEXOR-XR) 75 MG 24 hr capsule Take 1 capsule (75 mg total) by mouth daily with breakfast. 30 capsule 2   No current facility-administered medications for this visit.    Previous Psychotropic Medications: Yes   Substance Abuse History in the last 12 months:  No.  Consequences of Substance Abuse: NA  Medical Decision Making:  Established Problem, Stable/Improving (1), Review or order clinical lab tests (1), Review and summation of old records (2), Review or order medicine tests (1), Review of Medication Regimen & Side Effects (2) and Review of New Medication or Change in Dosage (2)  Treatment Plan  Summary: Medication management  This patient recently went through hospital treatment for severe depression. Now he is on multiple psychiatric medications and for the most part he is doing better. I'm hoping over time we can taper off some of these as they can have additive side effects. For now they will remain in we will check a Depakote level. He will continue Depakote for mood stabilization, Paxil and Effexor for depression and Seroquel and trazodone for sleep. His memory loss should improve as he gets further away from the time of the ECT. His brain MRI was generally unremarkable. He declines counseling at this time but we'll reassess this in 4 weeks when he returns    [place], [facility] 7/12/20169:44 AM

## 2015-01-12 LAB — VALPROIC ACID LEVEL: VALPROIC ACID LVL: 50.4 ug/mL (ref 50.0–100.0)

## 2015-02-08 ENCOUNTER — Encounter (HOSPITAL_COMMUNITY): Payer: Self-pay | Admitting: Psychiatry

## 2015-02-08 ENCOUNTER — Ambulatory Visit (INDEPENDENT_AMBULATORY_CARE_PROVIDER_SITE_OTHER): Payer: 59 | Admitting: Psychiatry

## 2015-02-08 VITALS — BP 147/92 | HR 67 | Ht 71.0 in | Wt 175.0 lb

## 2015-02-08 DIAGNOSIS — F32A Depression, unspecified: Secondary | ICD-10-CM

## 2015-02-08 DIAGNOSIS — F329 Major depressive disorder, single episode, unspecified: Secondary | ICD-10-CM

## 2015-02-08 MED ORDER — DIVALPROEX SODIUM ER 500 MG PO TB24: 500.0000 mg | ORAL_TABLET | Freq: Every day | ORAL | Status: DC

## 2015-02-08 MED ORDER — QUETIAPINE FUMARATE 50 MG PO TABS: 50.0000 mg | ORAL_TABLET | Freq: Every day | ORAL | Status: DC

## 2015-02-08 MED ORDER — PAROXETINE HCL 20 MG PO TABS: 20.0000 mg | ORAL_TABLET | Freq: Every day | ORAL | Status: DC

## 2015-02-08 MED ORDER — VENLAFAXINE HCL ER 75 MG PO CP24: 75.0000 mg | ORAL_CAPSULE | Freq: Every day | ORAL | Status: DC

## 2015-02-08 NOTE — Progress Notes (Signed)
Patient ID: Lawrence Bennett, male   DOB: 24-Jun-1956, 59 y.o.   MRN: [id number]  Psychiatric Adult Follow up  Patient Identification: Lawrence Bennett MRN:  [id number] Date of Evaluation:  02/08/2015 Referral Source: [facility] Chief Complaint:   Chief Complaint    Depression; Anxiety; Follow-up     Visit Diagnosis:    ICD-9-CM ICD-10-CM   1. Depression 311 F32.9    Diagnosis:   Patient Active Problem List   Diagnosis Date Noted  . Major depressive disorder, recurrent episode, moderate [F33.1]   . Diverticulosis of colon without hemorrhage [K57.30] 05/02/2014  . Essential hypertension [I10] 04/15/2014  . Anxiety [F41.9] 04/15/2014  . Hearing loss [H91.90] 04/15/2014  . Dizziness [R42] 04/15/2014   History of Present Illness: This patient is a 59 year old married white male who lives with his wife and 3 children ages 16,15 and 37 in 72. 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  The patient presents with his wife today. He is very hard of hearing and it's difficult to get a good history from him but she filled in the gaps. She states that he's had depression for at least one year. He started worrying about his job being discontinued or that [name] might close about a year ago. He finally decided to retire early in November. He also had some medical problems going on such as loss of vision and loss of hearing. In 2012 he had colon resection for diverticulosis.  Since retiring in November his mental status has declined. He started staying in bed all the time being unable to function not attending to his ADLs. He stopped eating and lost about 10 pounds. He was not interacting with anyone at all. His primary doctor had put him on Zoloft but it was not helpful. He had been on a low dose of Ativan for years as well. Finally in the end of May his wife brought him to [name] ED for evaluation and from there he was  transferred to [facility] geropsychiatry unit.  While on the unit he was diagnosed with severe depression. He was started on a number of medicines including Paxil and Effexor, Seroquel trazodone and Depakote. He also had 4 treatments of ECT while in the hospital and to as an outpatient. His last one was in early June.  Since getting out of the hospital he seems to be doing somewhat better. He is getting out of bed every day and doing things around the house and yard. His family went on a trip to a [place] and he refused to go with them. States that he's had a lot of short-term memory loss since having ECT but his wife thinks his mood is much better. He's never had psychotic symptoms such as auditory or visual hallucinations or paranoia but his thoughts were somewhat disorganized. He has never been suicidal or homicidal. He does not drink or use drugs. His wife thinks that he slowly getting better but he still not the person he used to be. He complains of dry mouth secondary to all the medications. His Depakote level has not been checked since he left the hospital  The patient returns after 4 weeks. He has had a Depakote level and it is okay at 50. He still struggling to hear and I strongly suggested he have a hearing assessment. His mood is been fairly stable and he's been getting out more with his  sons and mowing the lawn. He still seems a little bit confused and disconnected but there is no overt psychotic symptoms. He complains of severe dry mouth and I told him we can try to cut down the Seroquel just a little bit to 50 mg Elements:  Location:  Global. Quality:  Severe. Severity:  Severe. Timing:  Subchronic. Duration:  Approximately one year. Context:  Medical issues, retirement from work. Associated Signs/Symptoms: Depression Symptoms:  depressed mood, anhedonia, psychomotor retardation, feelings of worthlessness/guilt, difficulty concentrating, anxiety, panic  attacks, loss of energy/fatigue, weight loss,  Anxiety Symptoms:  Excessive Worry, Panic Symptoms, Social Anxiety,   Past Medical History:  Past Medical History  Diagnosis Date  . Dyslipidemia   . Mild hypertension   . Anxiety   . Atrial contractions, premature   . Atherosclerosis of aorta   . Hyperlipidemia   . Depression   . Urinary hesitancy     Past Surgical History  Procedure Laterality Date  . Colon resection     Family History:  Family History  Problem Relation Age of Onset  . Stroke Father   . Bipolar disorder Father   . Hypertension Sister   . Depression Sister    Social History:   History   Social History  . Marital Status: Married    Spouse Name: N/A  . Number of Children: N/A  . Years of Education: N/A   Social History Main Topics  . Smoking status: Former Smoker -- 0.20 packs/day for 19 years    Types: Cigarettes  . Smokeless tobacco: Never Used  . Alcohol Use: No  . Drug Use: No  . Sexual Activity: Not on file   Other Topics Concern  . None   Social History Narrative   Additional Social History: Patient grew up in [place]. He has 3 sisters. He states that he had a great childhood with no history of abuse or trauma. He finished high school and has one year of college. 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 He has 3 children ages 43 and 35 and his oldest daughter attends [employer]. He is concerned about finances given that he is paying for his daughter's college.  Musculoskeletal: Strength & Muscle Tone: within normal limits Gait & Station: normal Patient leans: N/A  Psychiatric Specialty Exam: Depression        Past medical history includes anxiety.   Anxiety Symptoms include nervous/anxious behavior.      Review of Systems  Constitutional: Positive for weight loss.  HENT: Positive for hearing loss.   Eyes: Positive for blurred vision.  Psychiatric/Behavioral: Positive for depression and  memory loss. The patient is nervous/anxious.     Blood pressure 147/92, pulse 67, height 5\' 11"  (1.803 m), weight 175 lb (79.379 kg).Body mass index is 24.42 kg/(m^2).  General Appearance: Casual, Neat and Well Groomed  Eye Contact:  Good  Speech:  Slow  Volume:  Decreased  Mood:  A little brighter today   Affect:  Blunt and Constricted  Thought Process:  Goal Directed  Orientation:  Full (Time, Place, and Person)  Thought Content:  Rumination  Suicidal Thoughts:  No  Homicidal Thoughts:  No  Memory:  Immediate;   Poor Recent;   Fair Remote;   Fair  Judgement:  Impaired  Insight:  Lacking  Psychomotor Activity:  Decreased  Concentration:  Fair  Recall:  Poor  Fund of Knowledge:Good  Language: Good  Akathisia:  No  Handed:  Right  AIMS (if indicated):    Assets:  Communication Skills Desire for Improvement Resilience Social Support Talents/Skills  ADL's:  Intact  Cognition: WNL  Sleep:     Is the patient at risk to self?  No. Has the patient been a risk to self in the past 6 months?  No. Has the patient been a risk to self within the distant past?  No. Is the patient a risk to others?  No. Has the patient been a risk to others in the past 6 months?  No. Has the patient been a risk to others within the distant past?  No.  Allergies:  No Known Allergies Current Medications: Current Outpatient Prescriptions  Medication Sig Dispense Refill  . amLODipine (NORVASC) 2.5 MG tablet     . divalproex (DEPAKOTE ER) 500 MG 24 hr tablet Take 1 tablet (500 mg total) by mouth at bedtime. 30 tablet 2  . LORazepam (ATIVAN) 0.5 MG tablet Take 1 tablet (0.5 mg total) by mouth 3 (three) times daily. 90 tablet 2  . PARoxetine (PAXIL) 20 MG tablet Take 1 tablet (20 mg total) by mouth at bedtime. 30 tablet 2  . traZODone (DESYREL) 50 MG tablet Take 1 tablet (50 mg total) by mouth at bedtime. (Patient taking differently: Take 50 mg by mouth. Taking 1-2 Tablets at Bedtime) 30 tablet 2  .  venlafaxine XR (EFFEXOR-XR) 75 MG 24 hr capsule Take 1 capsule (75 mg total) by mouth daily with breakfast. 30 capsule 2  . QUEtiapine (SEROQUEL) 50 MG tablet Take 1 tablet (50 mg total) by mouth at bedtime. 30 tablet 2   No current facility-administered medications for this visit.    Previous Psychotropic Medications: Yes   Substance Abuse History in the last 12 months:  No.  Consequences of Substance Abuse: NA  Medical Decision Making:  Established Problem, Stable/Improving (1), Review or order clinical lab tests (1), Review and summation of old records (2), Review or order medicine tests (1), Review of Medication Regimen & Side Effects (2) and Review of New Medication or Change in Dosage (2)  Treatment Plan Summary: Medication management  This patient recently went through hospital treatment for severe depression. Now he is on multiple psychiatric medications and for the most part he is doing better. I'm hoping over time we can taper off some of these as they can have additive side effects. For now they will remain in we will check a Depakote level. He will continue Depakote for mood stabilization, Paxil and Effexor for depression and Seroquel and trazodone for sleep. He will cut Seroquel down to 50,000,000 g at bedtime His memory loss should improve as he gets further away from the time of the ECT. His brain MRI was generally unremarkable. He'll return in 6 weeks    Esvin Hnat, [facility] 8/9/20165:12 PM

## 2015-03-25 ENCOUNTER — Encounter (HOSPITAL_COMMUNITY): Payer: Self-pay | Admitting: Psychiatry

## 2015-03-25 ENCOUNTER — Ambulatory Visit (INDEPENDENT_AMBULATORY_CARE_PROVIDER_SITE_OTHER): Payer: 59 | Admitting: Psychiatry

## 2015-03-25 VITALS — BP 140/90 | Ht 71.0 in | Wt 193.0 lb

## 2015-03-25 DIAGNOSIS — F322 Major depressive disorder, single episode, severe without psychotic features: Secondary | ICD-10-CM

## 2015-03-25 DIAGNOSIS — F329 Major depressive disorder, single episode, unspecified: Secondary | ICD-10-CM

## 2015-03-25 DIAGNOSIS — F419 Anxiety disorder, unspecified: Secondary | ICD-10-CM

## 2015-03-25 DIAGNOSIS — F32A Depression, unspecified: Secondary | ICD-10-CM

## 2015-03-25 MED ORDER — VENLAFAXINE HCL ER 75 MG PO CP24: 75.0000 mg | ORAL_CAPSULE | Freq: Every day | ORAL | Status: DC

## 2015-03-25 MED ORDER — LORAZEPAM 0.5 MG PO TABS: 0.5000 mg | ORAL_TABLET | Freq: Three times a day (TID) | ORAL | Status: DC

## 2015-03-25 MED ORDER — PAROXETINE HCL 20 MG PO TABS: 20.0000 mg | ORAL_TABLET | Freq: Every day | ORAL | Status: DC

## 2015-03-25 MED ORDER — TRAZODONE HCL 50 MG PO TABS: 50.0000 mg | ORAL_TABLET | Freq: Every day | ORAL | Status: DC

## 2015-03-25 MED ORDER — DIVALPROEX SODIUM ER 500 MG PO TB24: 500.0000 mg | ORAL_TABLET | Freq: Every day | ORAL | Status: DC

## 2015-03-25 NOTE — Progress Notes (Signed)
Patient ID: Lawrence Bennett, male   DOB: [birth year], 59 y.o.   MRN: [id number] Patient ID: Lawrence Bennett, male   DOB: 1955/11/17, 59 y.o.   MRN: [id number]  Psychiatric Adult Follow up  Patient Identification: Lawrence Bennett MRN:  [id number] Date of Evaluation:  03/25/2015 Referral Source: [facility] Chief Complaint:   Chief Complaint    Depression; Anxiety; Follow-up     Visit Diagnosis:  No diagnosis found. Diagnosis:   Patient Active Problem List   Diagnosis Date Noted  . Major depressive disorder, recurrent episode, moderate [F33.1]   . Diverticulosis of colon without hemorrhage [K57.30] 05/02/2014  . Essential hypertension [I10] 04/15/2014  . Anxiety [F41.9] 04/15/2014  . Hearing loss [H91.90] 04/15/2014  . Dizziness [R42] 04/15/2014   History of Present Illness: This patient is a 59 year old married white male who lives with his wife and 3 children ages 44,15 and 82 in 5. 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  The patient presents with his wife today. He is very hard of hearing and it's difficult to get a good history from him but she filled in the gaps. She states that he's had depression for at least one year. He started worrying about his job being discontinued or that [name] might close about a year ago. He finally decided to retire early in November. He also had some medical problems going on such as loss of vision and loss of hearing. In 2012 he had colon resection for diverticulosis.  Since retiring in November his mental status has declined. He started staying in bed all the time being unable to function not attending to his ADLs. He stopped eating and lost about 10 pounds. He was not interacting with anyone at all. His primary doctor had put him on Zoloft but it was not helpful. He had been on a low dose of Ativan for years as well. Finally in the end of May his wife brought him to  [name] ED for evaluation and from there he was transferred to [facility] geropsychiatry unit.  While on the unit he was diagnosed with severe depression. He was started on a number of medicines including Paxil and Effexor, Seroquel trazodone and Depakote. He also had 4 treatments of ECT while in the hospital and to as an outpatient. His last one was in early June.  Since getting out of the hospital he seems to be doing somewhat better. He is getting out of bed every day and doing things around the house and yard. His family went on a trip to a [place] and he refused to go with them. States that he's had a lot of short-term memory loss since having ECT but his wife thinks his mood is much better. He's never had psychotic symptoms such as auditory or visual hallucinations or paranoia but his thoughts were somewhat disorganized. He has never been suicidal or homicidal. He does not drink or use drugs. His wife thinks that he slowly getting better but he still not the person he used to be. He complains of dry mouth secondary to all the medications. His Depakote level has not been checked since he left the hospital  The patient returns after 6 weeks. He is getting better, getting out more with his children driving and working around his yard. His appetite has increased dramatically and he is constantly eating cake. If he doesn't have a cake he'll  go to the store and buy one. He has gained 18 pounds in 6 weeks I told him this would need to stop and he needs to switch to fruit for his snacks. I also going to discontinue the Seroquel which is only down to 25 mg anyway because it may be precipitating his appetite increase. He states his memory is starting to get better and he is now scheduled for hearing evaluation Elements:  Location:  Global. Quality:  Severe. Severity:  Severe. Timing:  Subchronic. Duration:  Approximately one year. Context:  Medical issues, retirement from  work. Associated Signs/Symptoms: Depression Symptoms:  depressed mood, anhedonia, psychomotor retardation, feelings of worthlessness/guilt, difficulty concentrating, anxiety, panic attacks, loss of energy/fatigue, weight loss,  Anxiety Symptoms:  Excessive Worry, Panic Symptoms, Social Anxiety,   Past Medical History:  Past Medical History  Diagnosis Date  . Dyslipidemia   . Mild hypertension   . Anxiety   . Atrial contractions, premature   . Atherosclerosis of aorta   . Hyperlipidemia   . Depression   . Urinary hesitancy     Past Surgical History  Procedure Laterality Date  . Colon resection     Family History:  Family History  Problem Relation Age of Onset  . Stroke Father   . Bipolar disorder Father   . Hypertension Sister   . Depression Sister    Social History:   Social History   Social History  . Marital Status: Married    Spouse Name: N/A  . Number of Children: N/A  . Years of Education: N/A   Social History Main Topics  . Smoking status: Former Smoker -- 0.20 packs/day for 19 years    Types: Cigarettes  . Smokeless tobacco: Never Used  . Alcohol Use: No  . Drug Use: No  . Sexual Activity: Not Asked   Other Topics Concern  . None   Social History Narrative   Additional Social History: Patient grew up in [place]. He has 3 sisters. He states that he had a great childhood with no history of abuse or trauma. He finished high school and has one year of college. 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 He has 3 children ages 59 and 21 and his oldest daughter attends [employer]. He is concerned about finances given that he is paying for his daughter's college.  Musculoskeletal: Strength & Muscle Tone: within normal limits Gait & Station: normal Patient leans: N/A  Psychiatric Specialty Exam: Depression        Past medical history includes anxiety.   Anxiety Symptoms include nervous/anxious behavior.       Review of Systems  Constitutional: Positive for weight loss.  HENT: Positive for hearing loss.   Eyes: Positive for blurred vision.  Psychiatric/Behavioral: Positive for depression and memory loss. The patient is nervous/anxious.     Blood pressure 140/90, height 5\' 11"  (1.803 m), weight 193 lb (87.544 kg).Body mass index is 26.93 kg/(m^2).  General Appearance: Casual, Neat and Well Groomed  Eye Contact:  Good  Speech:  Slow  Volume:  Decreased  Mood:   brighter today   Affect:  Brighter   Thought Process:  Goal Directed  Orientation:  Full (Time, Place, and Person)  Thought Content:  Rumination  Suicidal Thoughts:  No  Homicidal Thoughts:  No  Memory:  Immediate;   Poor Recent;   Fair Remote;   Fair  Judgement:  Impaired  Insight:  Lacking  Psychomotor Activity:  Normal   Concentration:  Fair  Recall:  Poor  Fund of Knowledge:Good  Language: Good  Akathisia:  No  Handed:  Right  AIMS (if indicated):    Assets:  Communication Skills Desire for Improvement Resilience Social Support Talents/Skills  ADL's:  Intact  Cognition: WNL  Sleep:     Is the patient at risk to self?  No. Has the patient been a risk to self in the past 6 months?  No. Has the patient been a risk to self within the distant past?  No. Is the patient a risk to others?  No. Has the patient been a risk to others in the past 6 months?  No. Has the patient been a risk to others within the distant past?  No.  Allergies:  No Known Allergies Current Medications: Current Outpatient Prescriptions  Medication Sig Dispense Refill  . amLODipine (NORVASC) 2.5 MG tablet     . divalproex (DEPAKOTE ER) 500 MG 24 hr tablet Take 1 tablet (500 mg total) by mouth at bedtime. 30 tablet 2  . LORazepam (ATIVAN) 0.5 MG tablet Take 1 tablet (0.5 mg total) by mouth 3 (three) times daily. 90 tablet 2  . PARoxetine (PAXIL) 20 MG tablet Take 1 tablet (20 mg total) by mouth at bedtime. 30 tablet 2  . traZODone  (DESYREL) 50 MG tablet Take 1 tablet (50 mg total) by mouth at bedtime. 30 tablet 2  . venlafaxine XR (EFFEXOR-XR) 75 MG 24 hr capsule Take 1 capsule (75 mg total) by mouth daily with breakfast. 30 capsule 2   No current facility-administered medications for this visit.    Previous Psychotropic Medications: Yes   Substance Abuse History in the last 12 months:  No.  Consequences of Substance Abuse: NA  Medical Decision Making:  Established Problem, Stable/Improving (1), Review or order clinical lab tests (1), Review and summation of old records (2), Review or order medicine tests (1), Review of Medication Regimen & Side Effects (2) and Review of New Medication or Change in Dosage (2)  Treatment Plan Summary: Medication management  This patient recently went through hospital treatment for severe depression. Now he is on multiple psychiatric medications and for the most part he is doing better. I'm hoping over time we can taper off some of these as they can have additive side effects.  He will continue Depakote for mood stabilization, Paxil and Effexor for depression and  trazodone for sleep. He will discontinue Seroquel altogether and return to see me in 2 months    Amanda, [facility] 9/23/20163:36 PM

## 2015-03-30 ENCOUNTER — Other Ambulatory Visit (HOSPITAL_COMMUNITY): Payer: Self-pay | Admitting: Orthopedic Surgery

## 2015-03-30 DIAGNOSIS — M542 Cervicalgia: Secondary | ICD-10-CM

## 2015-04-05 ENCOUNTER — Ambulatory Visit (HOSPITAL_COMMUNITY)
Admission: RE | Admit: 2015-04-05 | Discharge: 2015-04-05 | Disposition: A | Discharge status: Home / Self Care | Payer: 59 | Source: Ambulatory Visit | Attending: Orthopedic Surgery | Admitting: Orthopedic Surgery

## 2015-04-05 DIAGNOSIS — M542 Cervicalgia: Secondary | ICD-10-CM | POA: Insufficient documentation

## 2015-04-05 DIAGNOSIS — R200 Anesthesia of skin: Secondary | ICD-10-CM | POA: Insufficient documentation

## 2015-04-20 ENCOUNTER — Telehealth (HOSPITAL_COMMUNITY): Payer: Self-pay | Admitting: *Deleted

## 2015-04-20 NOTE — Telephone Encounter (Signed)
You may call enough to last until next appt

## 2015-04-20 NOTE — Telephone Encounter (Signed)
[email or web address]

## 2015-04-20 NOTE — Telephone Encounter (Signed)
phone call from patient, said he has been trying to get a refill for Lorazepam .5 for a couple of days.

## 2015-04-20 NOTE — Telephone Encounter (Signed)
Per pt, he can not file his Ativan printed script. Per pt, he is out of his medication. Per pt he don't know where the script is. Pt number is [phone].

## 2015-04-21 NOTE — Telephone Encounter (Signed)
Per Radene Journey, pt called office back shortly after message was sent to provider to inform office that he did find his printed script and that he was sorry for the confusion.

## 2015-05-20 ENCOUNTER — Ambulatory Visit (INDEPENDENT_AMBULATORY_CARE_PROVIDER_SITE_OTHER): Payer: 59 | Admitting: Psychiatry

## 2015-05-20 ENCOUNTER — Encounter (HOSPITAL_COMMUNITY): Payer: Self-pay | Admitting: Psychiatry

## 2015-05-20 VITALS — BP 160/110 | Ht 71.0 in | Wt 203.0 lb

## 2015-05-20 DIAGNOSIS — F32A Depression, unspecified: Secondary | ICD-10-CM

## 2015-05-20 DIAGNOSIS — F329 Major depressive disorder, single episode, unspecified: Secondary | ICD-10-CM | POA: Diagnosis not present

## 2015-05-20 MED ORDER — TRAZODONE HCL 50 MG PO TABS: 50.0000 mg | ORAL_TABLET | Freq: Every day | ORAL | Status: DC

## 2015-05-20 MED ORDER — VENLAFAXINE HCL ER 75 MG PO CP24: 75.0000 mg | ORAL_CAPSULE | Freq: Every day | ORAL | Status: DC

## 2015-05-20 MED ORDER — PAROXETINE HCL 20 MG PO TABS: 20.0000 mg | ORAL_TABLET | Freq: Every day | ORAL | Status: DC

## 2015-05-20 MED ORDER — LORAZEPAM 0.5 MG PO TABS: 0.5000 mg | ORAL_TABLET | Freq: Three times a day (TID) | ORAL | Status: DC

## 2015-05-20 MED ORDER — DIVALPROEX SODIUM ER 500 MG PO TB24: 500.0000 mg | ORAL_TABLET | Freq: Every day | ORAL | Status: DC

## 2015-05-20 NOTE — Progress Notes (Signed)
Patient ID: Lawrence Bennett, male   DOB: 04-23-1956, 59 y.o.   MRN: [id number] Patient ID: Lawrence Bennett, male   DOB: 05/18/1956, 59 y.o.   MRN: [id number] Patient ID: Lawrence Bennett, male   DOB: 08-26-1955, 59 y.o.   MRN: [id number]  Psychiatric Adult Follow up  Patient Identification: Lawrence Bennett MRN:  [id number] Date of Evaluation:  05/20/2015 Referral Source: [facility] Chief Complaint:   Chief Complaint    Depression; Anxiety; Follow-up     Visit Diagnosis:    ICD-9-CM ICD-10-CM   1. Depression 311 F32.9    Diagnosis:   Patient Active Problem List   Diagnosis Date Noted  . Major depressive disorder, recurrent episode, moderate (HCC) [F33.1]   . Diverticulosis of colon without hemorrhage [K57.30] 05/02/2014  . Essential hypertension [I10] 04/15/2014  . Anxiety [F41.9] 04/15/2014  . Hearing loss [H91.90] 04/15/2014  . Dizziness [R42] 04/15/2014   History of Present Illness: This patient is a 59 year old married white male who lives with his wife and 3 children ages 77,15 and 45 in [place]. 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  The patient presents with his wife today. He is very hard of hearing and it's difficult to get a good history from him but she filled in the gaps. She states that he's had depression for at least one year. He started worrying about his job being discontinued or that [name] might close about a year ago. He finally decided to retire early in November. He also had some medical problems going on such as loss of vision and loss of hearing. In 2012 he had colon resection for diverticulosis.  Since retiring in November his mental status has declined. He started staying in bed all the time being unable to function not attending to his ADLs. He stopped eating and lost about 10 pounds. He was not interacting with anyone at all. His primary doctor had put him on Zoloft but it was not  helpful. He had been on a low dose of Ativan for years as well. Finally in the end of May his wife brought him to [name] ED for evaluation and from there he was transferred to [facility] geropsychiatry unit.  While on the unit he was diagnosed with severe depression. He was started on a number of medicines including Paxil and Effexor, Seroquel trazodone and Depakote. He also had 4 treatments of ECT while in the hospital and to as an outpatient. His last one was in early June.  Since getting out of the hospital he seems to be doing somewhat better. He is getting out of bed every day and doing things around the house and yard. His family went on a trip to a [place] and he refused to go with them. States that he's had a lot of short-term memory loss since having ECT but his wife thinks his mood is much better. He's never had psychotic symptoms such as auditory or visual hallucinations or paranoia but his thoughts were somewhat disorganized. He has never been suicidal or homicidal. He does not drink or use drugs. His wife thinks that he slowly getting better but he still not the person he used to be. He complains of dry mouth secondary to all the medications. His Depakote level has not been checked since he left the hospital  The patient returns after 6 weeks. He is here with his wife. He  is generally doing better, getting out more. He is no longer staying in bed much. His mood is improved. However he continues to gain weight. He tried cutting back the Seroquel but didn't feel right and went back to the 50 mg again. His blood pressure is high today and his wife promises me that she will get him in to see his family doctor. He is driving on his own again and doing something with his kids every weekend. Elements:  Location:  Global. Quality:  Severe. Severity:  Severe. Timing:  Subchronic. Duration:  Approximately one year. Context:  Medical issues, retirement from work. Associated  Signs/Symptoms: Depression Symptoms:  depressed mood, anhedonia, psychomotor retardation, feelings of worthlessness/guilt, difficulty concentrating, anxiety, panic attacks, loss of energy/fatigue, weight loss,  Anxiety Symptoms:  Excessive Worry, Panic Symptoms, Social Anxiety,   Past Medical History:  Past Medical History  Diagnosis Date  . Dyslipidemia   . Mild hypertension   . Anxiety   . Atrial contractions, premature   . Atherosclerosis of aorta ([place])   . Hyperlipidemia   . Depression   . Urinary hesitancy     Past Surgical History  Procedure Laterality Date  . Colon resection     Family History:  Family History  Problem Relation Age of Onset  . Stroke Father   . Bipolar disorder Father   . Hypertension Sister   . Depression Sister    Social History:   Social History   Social History  . Marital Status: Married    Spouse Name: N/A  . Number of Children: N/A  . Years of Education: N/A   Social History Main Topics  . Smoking status: Former Smoker -- 0.20 packs/day for 19 years    Types: Cigarettes  . Smokeless tobacco: Never Used  . Alcohol Use: No  . Drug Use: No  . Sexual Activity: Not Asked   Other Topics Concern  . None   Social History Narrative   Additional Social History: Patient grew up in [place]. He has 3 sisters. He states that he had a great childhood with no history of abuse or trauma. He finished high school and has one year of college. 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 He has 3 children ages 63 and 75 and his oldest daughter attends [employer]. He is concerned about finances given that he is paying for his daughter's college.  Musculoskeletal: Strength & Muscle Tone: within normal limits Gait & Station: normal Patient leans: N/A  Psychiatric Specialty Exam: Depression        Past medical history includes anxiety.   Anxiety Symptoms include nervous/anxious behavior.      Review  of Systems  Constitutional: Positive for weight loss.  HENT: Positive for hearing loss.   Eyes: Positive for blurred vision.  Psychiatric/Behavioral: Positive for depression and memory loss. The patient is nervous/anxious.     Blood pressure 160/110, height 5\' 11"  (1.803 m), weight 203 lb (92.08 kg).Body mass index is 28.33 kg/(m^2).  General Appearance: Casual, Neat and Well Groomed  Eye Contact:  Good  Speech:  Slow  Volume:  Decreased  Mood:   brighter today   Affect:  Brighter   Thought Process:  Goal Directed  Orientation:  Full (Time, Place, and Person)  Thought Content:  Rumination  Suicidal Thoughts:  No  Homicidal Thoughts:  No  Memory:  Immediate;   Poor Recent;   Fair Remote;   Fair  Judgement:  Impaired  Insight:  Lacking  Psychomotor Activity:  Normal   Concentration:  Fair  Recall:  Poor  Fund of Knowledge:Good  Language: Good  Akathisia:  No  Handed:  Right  AIMS (if indicated):    Assets:  Communication Skills Desire for Improvement Resilience Social Support Talents/Skills  ADL's:  Intact  Cognition: WNL  Sleep:     Is the patient at risk to self?  No. Has the patient been a risk to self in the past 6 months?  No. Has the patient been a risk to self within the distant past?  No. Is the patient a risk to others?  No. Has the patient been a risk to others in the past 6 months?  No. Has the patient been a risk to others within the distant past?  No.  Allergies:  No Known Allergies Current Medications: Current Outpatient Prescriptions  Medication Sig Dispense Refill  . amLODipine (NORVASC) 2.5 MG tablet     . divalproex (DEPAKOTE ER) 500 MG 24 hr tablet Take 1 tablet (500 mg total) by mouth at bedtime. 30 tablet 2  . LORazepam (ATIVAN) 0.5 MG tablet Take 1 tablet (0.5 mg total) by mouth 3 (three) times daily. 90 tablet 2  . PARoxetine (PAXIL) 20 MG tablet Take 1 tablet (20 mg total) by mouth at bedtime. 30 tablet 2  . traZODone (DESYREL) 50 MG tablet  Take 1 tablet (50 mg total) by mouth at bedtime. 30 tablet 2  . venlafaxine XR (EFFEXOR-XR) 75 MG 24 hr capsule Take 1 capsule (75 mg total) by mouth daily with breakfast. 30 capsule 2   No current facility-administered medications for this visit.    Previous Psychotropic Medications: Yes   Substance Abuse History in the last 12 months:  No.  Consequences of Substance Abuse: NA  Medical Decision Making:  Established Problem, Stable/Improving (1), Review or order clinical lab tests (1), Review and summation of old records (2), Review or order medicine tests (1), Review of Medication Regimen & Side Effects (2) and Review of New Medication or Change in Dosage (2)  Treatment Plan Summary: Medication management  This patient recently went through hospital treatment for severe depression. Now he is on multiple psychiatric medications and for the most part he is doing better. I'm hoping over time we can taper off some of these as they can have additive side effects.  He will continue Depakote for mood stabilization, Paxil and Effexor for depression and  trazodone for sleep. He will continue Seroquel 50 mg at bedtime and return to see me in 3 months    ROSS, [facility] 11/18/20163:49 PM

## 2015-07-04 MED FILL — DIVALPROEX SOD ER 500 MG TA: 500 | 30 days supply | Qty: 30 | Fill #1

## 2015-07-18 MED FILL — VENLAFAXINE HCL ER 75 MG CA: 75 | 30 days supply | Qty: 30 | Fill #2

## 2015-07-18 MED FILL — LORazepam 0.5 MG TABS: 0.5 | 30 days supply | Qty: 90 | Fill #0

## 2015-07-18 MED FILL — PARoxetine HCL 20 MG TABS: 20 | 30 days supply | Qty: 30 | Fill #2

## 2015-07-19 ENCOUNTER — Encounter (HOSPITAL_COMMUNITY): Payer: Self-pay | Admitting: Emergency Medicine

## 2015-07-19 ENCOUNTER — Emergency Department (INDEPENDENT_AMBULATORY_CARE_PROVIDER_SITE_OTHER)
Admission: EM | Admit: 2015-07-19 | Discharge: 2015-07-19 | Disposition: A | Discharge status: Home / Self Care | Payer: 59 | Source: Home / Self Care | Attending: Emergency Medicine | Admitting: Emergency Medicine

## 2015-07-19 DIAGNOSIS — H6122 Impacted cerumen, left ear: Secondary | ICD-10-CM

## 2015-07-19 DIAGNOSIS — J069 Acute upper respiratory infection, unspecified: Secondary | ICD-10-CM

## 2015-07-19 MED ORDER — CARBAMIDE PEROXIDE 6.5 % OT SOLN
OTIC | Status: DC
Start: 2015-07-19 — End: 2019-03-25

## 2015-07-19 NOTE — ED Provider Notes (Signed)
CSN: [id number]     Arrival date & time 07/19/15  1758 History   First MD Initiated Contact with Patient 07/19/15 1856     Chief Complaint  Patient presents with  . Cerumen Impaction  . Sinus Problem   (Consider location/radiation/quality/duration/timing/severity/associated sxs/prior Treatment) HPI Congestion that is worse in the morning. Clears over the course of the day. Also decrease hearing left ear. Previous cerumen impaction. Home OTC meds for congestion symptoms.  Past Medical History  Diagnosis Date  . Dyslipidemia   . Mild hypertension   . Anxiety   . Atrial contractions, premature   . Atherosclerosis of aorta ([place])   . Hyperlipidemia   . Depression   . Urinary hesitancy    Past Surgical History  Procedure Laterality Date  . Colon resection     Family History  Problem Relation Age of Onset  . Stroke Father   . Bipolar disorder Father   . Hypertension Sister   . Depression Sister    Social History  Substance Use Topics  . Smoking status: Former Smoker -- 0.20 packs/day for 19 years    Types: Cigarettes  . Smokeless tobacco: Never Used  . Alcohol Use: No    Review of Systems ROS +'ve congestion, ear wax build up left ear.   Denies: HEADACHE, NAUSEA, ABDOMINAL PAIN, CHEST PAIN,   DYSURIA, SHORTNESS OF BREATH  Allergies  Review of patient's allergies indicates no known allergies.  Home Medications   Prior to Admission medications   Medication Sig Start Date End Date Taking? Authorizing Provider  amLODipine (NORVASC) 2.5 MG tablet  12/01/14   Historical Provider, MD  divalproex (DEPAKOTE ER) 500 MG 24 hr tablet Take 1 tablet (500 mg total) by mouth at bedtime. 05/20/15   Cloria Spring, MD  LORazepam (ATIVAN) 0.5 MG tablet Take 1 tablet (0.5 mg total) by mouth 3 (three) times daily. 05/20/15   Cloria Spring, MD  PARoxetine (PAXIL) 20 MG tablet Take 1 tablet (20 mg total) by mouth at bedtime. 05/20/15   Cloria Spring, MD  traZODone (DESYREL) 50 MG tablet  Take 1 tablet (50 mg total) by mouth at bedtime. 05/20/15   Cloria Spring, MD  venlafaxine XR (EFFEXOR-XR) 75 MG 24 hr capsule Take 1 capsule (75 mg total) by mouth daily with breakfast. 05/20/15   Cloria Spring, MD   Meds Ordered and Administered this Visit  Medications - No data to display  BP 153/62 mmHg  Pulse 78  Temp(Src) 98.4 F (36.9 C) (Oral)  Resp 16  SpO2 96% No data found.   Physical Exam  Constitutional: He is oriented to person, place, and time. He appears well-developed and well-nourished.  HENT:  Head: Normocephalic and atraumatic.  Right Ear: Hearing and tympanic membrane normal.  Left Ear: Decreased hearing is noted.  Hard cerumen impacted left EAC  Pulmonary/Chest: Effort normal and breath sounds normal.  Neurological: He is alert and oriented to person, place, and time.  Skin: Skin is warm and dry.  Psychiatric: He has a normal mood and affect. His behavior is normal. Judgment and thought content normal.    ED Course  Procedures (including critical care time)  Labs Review Labs Reviewed - No data to display  Imaging Review No results found.   Visual Acuity Review  Right Eye Distance:   Left Eye Distance:   Bilateral Distance:    Right Eye Near:   Left Eye Near:    Bilateral Near:  Exam after treatment:  Left EAC clear  MDM   1. Cerumen impaction, left   2. URI (upper respiratory infection)     Patient is advised to continue home symptomatic treatment. Prescription for debrox drops  sent pharmacy patient has indicated. Patient is advised that if there are new or worsening symptoms or attend the emergency department, or contact primary care provider. Instructions of care provided discharged home in stable condition.  THIS NOTE WAS GENERATED USING A VOICE RECOGNITION SOFTWARE PROGRAM. ALL REASONABLE EFFORTS  WERE MADE TO PROOFREAD THIS DOCUMENT FOR ACCURACY.     Konrad Felix, PA 07/19/15 2003

## 2015-07-19 NOTE — ED Notes (Signed)
Here with sneezing, coughing that worsens at bedtime and left ear wax impaction Started 3 days ago Denies dizziness, headache or nasal drip Tried Zinc and olive oil

## 2015-07-19 NOTE — Discharge Instructions (Signed)
Ear Drops, Adult You need to put eardrops in your ear. HOME CARE   Put drops in your affected ear as told.  After putting in the drops, lie down with the ear you put the drops in facing up. Stay this way for 10 minutes. Use the ear drops as long as your doctor tells you.  Before you get up, put a cotton ball gently in your ear. Do not push it far in your ear.  Do not wash out your ears unless your doctor says it is okay.  Finish all medicines as told by your doctor. You may be told to keep using the eardrops even if you start to feel better.  See your doctor as told for follow-up visits. GET HELP IF:  You have pain that gets worse.  Any unusual fluid (drainage) is coming from your ear (especially if the fluid stinks).  You have trouble hearing.  You get really dizzy as if the room is spinning and feel sick to your stomach (vertigo).  The outside of your ear becomes red or puffy or both. This may be a sign of an allergic reaction. MAKE SURE YOU:   Understand these instructions.  Will watch your condition.  Will get help right away if you are not doing well or get worse.   This information is not intended to replace advice given to you by your health care provider. Make sure you discuss any questions you have with your health care provider.   Document Released: 12/06/2009 Document Revised: 07/09/2014 Document Reviewed: 01/13/2013 Elsevier Interactive Patient Education 2016 Elsevier [employer] Upper Respiratory Infection, Adult Most upper respiratory infections (URIs) are caused by a virus. A URI affects the nose, throat, and upper air passages. The most common type of URI is often called "the common cold." HOME CARE   Take medicines only as told by your doctor.  Gargle warm saltwater or take cough drops to comfort your throat as told by your doctor.  Use a warm mist humidifier or inhale steam from a shower to increase air moisture. This may make it easier to breathe.  Drink  enough fluid to keep your pee (urine) clear or pale yellow.  Eat soups and other clear broths.  Have a healthy diet.  Rest as needed.  Go back to work when your fever is gone or your doctor says it is okay.  You may need to stay home longer to avoid giving your URI to others.  You can also wear a face mask and wash your hands often to prevent spread of the virus.  Use your inhaler more if you have asthma.  Do not use any tobacco products, including cigarettes, chewing tobacco, or electronic cigarettes. If you need help quitting, ask your doctor. GET HELP IF:  You are getting worse, not better.  Your symptoms are not helped by medicine.  You have chills.  You are getting more short of breath.  You have brown or red mucus.  You have yellow or brown discharge from your nose.  You have pain in your face, especially when you bend forward.  You have a fever.  You have puffy (swollen) neck glands.  You have pain while swallowing.  You have white areas in the back of your throat. GET HELP RIGHT AWAY IF:   You have very bad or constant:  Headache.  Ear pain.  Pain in your forehead, behind your eyes, and over your cheekbones (sinus pain).  Chest pain.  You have long-lasting (  chronic) lung disease and any of the following:  Wheezing.  Long-lasting cough.  Coughing up blood.  A change in your usual mucus.  You have a stiff neck.  You have changes in your:  Vision.  Hearing.  Thinking.  Mood. MAKE SURE YOU:   Understand these instructions.  Will watch your condition.  Will get help right away if you are not doing well or get worse.   This information is not intended to replace advice given to you by your health care provider. Make sure you discuss any questions you have with your health care provider.   Document Released: 12/05/2007 Document Revised: 11/02/2014 Document Reviewed: 09/23/2013 Elsevier Interactive Patient Education 2016 Elsevier  Inc. Upper Respiratory Infection, Adult Most upper respiratory infections (URIs) are caused by a virus. A URI affects the nose, throat, and upper air passages. The most common type of URI is often called "the common cold." HOME CARE   Take medicines only as told by your doctor.  Gargle warm saltwater or take cough drops to comfort your throat as told by your doctor.  Use a warm mist humidifier or inhale steam from a shower to increase air moisture. This may make it easier to breathe.  Drink enough fluid to keep your pee (urine) clear or pale yellow.  Eat soups and other clear broths.  Have a healthy diet.  Rest as needed.  Go back to work when your fever is gone or your doctor says it is okay.  You may need to stay home longer to avoid giving your URI to others.  You can also wear a face mask and wash your hands often to prevent spread of the virus.  Use your inhaler more if you have asthma.  Do not use any tobacco products, including cigarettes, chewing tobacco, or electronic cigarettes. If you need help quitting, ask your doctor. GET HELP IF:  You are getting worse, not better.  Your symptoms are not helped by medicine.  You have chills.  You are getting more short of breath.  You have brown or red mucus.  You have yellow or brown discharge from your nose.  You have pain in your face, especially when you bend forward.  You have a fever.  You have puffy (swollen) neck glands.  You have pain while swallowing.  You have white areas in the back of your throat. GET HELP RIGHT AWAY IF:   You have very bad or constant:  Headache.  Ear pain.  Pain in your forehead, behind your eyes, and over your cheekbones (sinus pain).  Chest pain.  You have long-lasting (chronic) lung disease and any of the following:  Wheezing.  Long-lasting cough.  Coughing up blood.  A change in your usual mucus.  You have a stiff neck.  You have changes in  your:  Vision.  Hearing.  Thinking.  Mood. MAKE SURE YOU:   Understand these instructions.  Will watch your condition.  Will get help right away if you are not doing well or get worse.   This information is not intended to replace advice given to you by your health care provider. Make sure you discuss any questions you have with your health care provider.   Document Released: 12/05/2007 Document Revised: 11/02/2014 Document Reviewed: 09/23/2013 Elsevier Interactive Patient Education [employer].

## 2015-08-01 MED FILL — DIVALPROEX SOD ER 500 MG TA: 500 | 30 days supply | Qty: 30 | Fill #2

## 2015-08-12 ENCOUNTER — Ambulatory Visit (INDEPENDENT_AMBULATORY_CARE_PROVIDER_SITE_OTHER): Payer: 59 | Admitting: Psychiatry

## 2015-08-12 ENCOUNTER — Encounter (HOSPITAL_COMMUNITY): Payer: Self-pay | Admitting: Psychiatry

## 2015-08-12 VITALS — BP 140/60 | Ht 71.0 in | Wt 211.8 lb

## 2015-08-12 DIAGNOSIS — F32A Depression, unspecified: Secondary | ICD-10-CM

## 2015-08-12 DIAGNOSIS — F329 Major depressive disorder, single episode, unspecified: Secondary | ICD-10-CM | POA: Diagnosis not present

## 2015-08-12 MED ORDER — TRAZODONE HCL 50 MG PO TABS: 50.0000 mg | ORAL_TABLET | Freq: Every day | ORAL | Status: DC

## 2015-08-12 MED ORDER — PAROXETINE HCL 20 MG PO TABS: 20.0000 mg | ORAL_TABLET | Freq: Every day | ORAL | Status: DC

## 2015-08-12 MED ORDER — QUETIAPINE FUMARATE 50 MG PO TABS: 50.0000 mg | ORAL_TABLET | Freq: Every day | ORAL | Status: DC

## 2015-08-12 MED ORDER — LORAZEPAM 0.5 MG PO TABS: 0.5000 mg | ORAL_TABLET | Freq: Three times a day (TID) | ORAL | Status: DC

## 2015-08-12 MED ORDER — DIVALPROEX SODIUM ER 500 MG PO TB24: 500.0000 mg | ORAL_TABLET | Freq: Every day | ORAL | Status: DC

## 2015-08-12 MED ORDER — VENLAFAXINE HCL ER 75 MG PO CP24: 75.0000 mg | ORAL_CAPSULE | Freq: Every day | ORAL | Status: DC

## 2015-08-12 MED FILL — VENLAFAXINE HCL ER 75 MG CA: 75 | 30 days supply | Qty: 30 | Fill #0

## 2015-08-12 MED FILL — traZODone HCL 50 MG TABS: 50 | 30 days supply | Qty: 30 | Fill #0

## 2015-08-12 MED FILL — PARoxetine HCL 20 MG TABS: 20 | 30 days supply | Qty: 30 | Fill #0

## 2015-08-12 MED FILL — QUETIAPINE FUMARATE 50 MG T: 50 | 30 days supply | Qty: 30 | Fill #0

## 2015-08-12 NOTE — Progress Notes (Signed)
Patient ID: Lawrence Bennett, male   DOB: 1956/04/03, 60 y.o.   MRN: [id number] Patient ID: Lawrence Bennett, male   DOB: April 16, 1956, 60 y.o.   MRN: [id number] Patient ID: [name], male   DOB: 1956-05-06, 60 y.o.   MRN: [id number] Patient ID: [name], male   DOB: May 02, 1956, 60 y.o.   MRN: [id number]  Psychiatric Adult Follow up  Patient Identification: Lawrence Bennett MRN:  [id number] Date of Evaluation:  08/12/2015 Referral Source: [facility] Chief Complaint:   Chief Complaint    Depression; Anxiety; Follow-up     Visit Diagnosis:    ICD-9-CM ICD-10-CM   1. Depression 311 F32.9    Diagnosis:   Patient Active Problem List   Diagnosis Date Noted  . Major depressive disorder, recurrent episode, moderate (HCC) [F33.1]   . Diverticulosis of colon without hemorrhage [K57.30] 05/02/2014  . Essential hypertension [I10] 04/15/2014  . Anxiety [F41.9] 04/15/2014  . Hearing loss [H91.90] 04/15/2014  . Dizziness [R42] 04/15/2014   History of Present Illness: This patient is a 60 year old married white male who lives with his wife and 3 children ages 43,15 and 42 in 61. 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  The patient presents with his wife today. He is very hard of hearing and it's difficult to get a good history from him but she filled in the gaps. She states that he's had depression for at least one year. He started worrying about his job being discontinued or that [name] might close about a year ago. He finally decided to retire early in November. He also had some medical problems going on such as loss of vision and loss of hearing. In 2012 he had colon resection for diverticulosis.  Since retiring in November his mental status has declined. He started staying in bed all the time being unable to function not attending to his ADLs. He stopped eating and lost about 10 pounds. He was not interacting  with anyone at all. His primary doctor had put him on Zoloft but it was not helpful. He had been on a low dose of Ativan for years as well. Finally in the end of May his wife brought him to [name] ED for evaluation and from there he was transferred to [facility] geropsychiatry unit.  While on the unit he was diagnosed with severe depression. He was started on a number of medicines including Paxil and Effexor, Seroquel trazodone and Depakote. He also had 4 treatments of ECT while in the hospital and to as an outpatient. His last one was in early June.  Since getting out of the hospital he seems to be doing somewhat better. He is getting out of bed every day and doing things around the house and yard. His family went on a trip to a [place] and he refused to go with them. States that he's had a lot of short-term memory loss since having ECT but his wife thinks his mood is much better. He's never had psychotic symptoms such as auditory or visual hallucinations or paranoia but his thoughts were somewhat disorganized. He has never been suicidal or homicidal. He does not drink or use drugs. His wife thinks that he slowly getting better but he still not the person he used to be. He complains of dry mouth secondary to all the medications. His Depakote level has not been checked since he left  the hospital  The patient returns after 3 months. He is here with his wife. He is generally doing better, getting out more. He is no longer staying in bed much. His mood is improved. However he continues to gain weight. His blood pressure was high again today and his wife promised me that she will get him into a primary doctor as soon as possible. He still making poor food choices such as eating cake or pie every day. We definitely need to get his hemoglobin A1c and blood sugar checked and lipid panel. His mood generally is very good and he is getting out and doing things with his children and he denies  feeling anxious Elements:  Location:  Global. Quality:  Severe. Severity:  Severe. Timing:  Subchronic. Duration:  Approximately one year. Context:  Medical issues, retirement from work. Associated Signs/Symptoms: Depression Symptoms:  depressed mood, anhedonia, psychomotor retardation, feelings of worthlessness/guilt, difficulty concentrating, anxiety, panic attacks, loss of energy/fatigue, weight loss,  Anxiety Symptoms:  Excessive Worry, Panic Symptoms, Social Anxiety,   Past Medical History:  Past Medical History  Diagnosis Date  . Dyslipidemia   . Mild hypertension   . Anxiety   . Atrial contractions, premature   . Atherosclerosis of aorta ([place])   . Hyperlipidemia   . Depression   . Urinary hesitancy     Past Surgical History  Procedure Laterality Date  . Colon resection     Family History:  Family History  Problem Relation Age of Onset  . Stroke Father   . Bipolar disorder Father   . Hypertension Sister   . Depression Sister    Social History:   Social History   Social History  . Marital Status: Married    Spouse Name: N/A  . Number of Children: N/A  . Years of Education: N/A   Social History Main Topics  . Smoking status: Former Smoker -- 0.20 packs/day for 19 years    Types: Cigarettes  . Smokeless tobacco: Never Used  . Alcohol Use: No  . Drug Use: No  . Sexual Activity: Not Asked   Other Topics Concern  . None   Social History Narrative   Additional Social History: Patient grew up in [place]. He has 3 sisters. He states that he had a great childhood with no history of abuse or trauma. He finished high school and has one year of college. 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 He has 3 children ages 19 and 48 and his oldest daughter attends [employer]. He is concerned about finances given that he is paying for his daughter's college.  Musculoskeletal: Strength & Muscle Tone: within normal  limits Gait & Station: normal Patient leans: N/A  Psychiatric Specialty Exam: Depression        Past medical history includes anxiety.   Anxiety Symptoms include nervous/anxious behavior.      Review of Systems  Constitutional: Positive for weight loss.  HENT: Positive for hearing loss.   Eyes: Positive for blurred vision.  Psychiatric/Behavioral: Positive for depression and memory loss. The patient is nervous/anxious.     Blood pressure 140/60, height 5\' 11"  (1.803 m), weight 211 lb 12.8 oz (96.072 kg).Body mass index is 29.55 kg/(m^2).  General Appearance: Casual, Neat and Well Groomed  Eye Contact:  Good  Speech:  Slow  Volume:  Decreased  Mood:  good  Affect:  Brighter   Thought Process:  Goal Directed  Orientation:  Full (Time, Place,  and Person)  Thought Content:  Rumination  Suicidal Thoughts:  No  Homicidal Thoughts:  No  Memory:  Immediate;   Poor Recent;   Fair Remote;   Fair  Judgement:  Impaired  Insight:  Lacking  Psychomotor Activity:  Normal   Concentration:  Fair  Recall:  Poor  Fund of Knowledge:Good  Language: Good  Akathisia:  No  Handed:  Right  AIMS (if indicated):    Assets:  Communication Skills Desire for Improvement Resilience Social Support Talents/Skills  ADL's:  Intact  Cognition: WNL  Sleep:     Is the patient at risk to self?  No. Has the patient been a risk to self in the past 6 months?  No. Has the patient been a risk to self within the distant past?  No. Is the patient a risk to others?  No. Has the patient been a risk to others in the past 6 months?  No. Has the patient been a risk to others within the distant past?  No.  Allergies:  No Known Allergies Current Medications: Current Outpatient Prescriptions  Medication Sig Dispense Refill  . amLODipine (NORVASC) 2.5 MG tablet     . carbamide peroxide (DEBROX) 6.5 % otic solution Apply every 2 weeks just before your Saturday shower 15 mL 0  . divalproex (DEPAKOTE ER) 500  MG 24 hr tablet Take 1 tablet (500 mg total) by mouth at bedtime. 30 tablet 2  . LORazepam (ATIVAN) 0.5 MG tablet Take 1 tablet (0.5 mg total) by mouth 3 (three) times daily. 90 tablet 2  . PARoxetine (PAXIL) 20 MG tablet Take 1 tablet (20 mg total) by mouth at bedtime. 30 tablet 2  . QUEtiapine (SEROQUEL) 50 MG tablet Take 1 tablet (50 mg total) by mouth at bedtime. 30 tablet 2  . traZODone (DESYREL) 50 MG tablet Take 1 tablet (50 mg total) by mouth at bedtime. 30 tablet 2  . venlafaxine XR (EFFEXOR-XR) 75 MG 24 hr capsule Take 1 capsule (75 mg total) by mouth daily with breakfast. 30 capsule 2   No current facility-administered medications for this visit.    Previous Psychotropic Medications: Yes   Substance Abuse History in the last 12 months:  No.  Consequences of Substance Abuse: NA  Medical Decision Making:  Established Problem, Stable/Improving (1), Review or order clinical lab tests (1), Review and summation of old records (2), Review or order medicine tests (1), Review of Medication Regimen & Side Effects (2) and Review of New Medication or Change in Dosage (2)  Treatment Plan Summary: Medication management  This patient recently went through hospital treatment for severe depression. Now he is on multiple psychiatric medications and for the most part he is doing better. I'm hoping over time we can taper off some of these as they can have additive side effects.  He will continue Depakote for mood stabilization, Paxil and Effexor for depression and  trazodone for sleep. He will continue Seroquel 50 mg at bedtime and return to see me in 3 months. His wife promises me he will get in with a primary doctor and get all of his lab work checked within the next couple of weeks    [place], [facility] 2/10/20179:11 AM

## 2015-08-18 MED FILL — LORazepam 0.5 MG TABS: 0.5 | 30 days supply | Qty: 90 | Fill #1

## 2015-08-31 MED FILL — DIVALPROEX SOD ER 500 MG TA: 500 | 30 days supply | Qty: 30 | Fill #0

## 2015-09-15 MED FILL — PARoxetine HCL 20 MG TABS: 20 | 30 days supply | Qty: 30 | Fill #1

## 2015-09-15 MED FILL — LORazepam 0.5 MG TABS: 0.5 | 30 days supply | Qty: 90 | Fill #2

## 2015-09-15 MED FILL — AMLODIPINE BESYLATE 2.5 MG: 2.5 | 90 days supply | Qty: 90 | Fill #7

## 2015-09-26 MED FILL — VENLAFAXINE HCL ER 75 MG CA: 75 | 30 days supply | Qty: 30 | Fill #1

## 2015-09-26 MED FILL — DIVALPROEX SOD ER 500 MG TA: 500 | 30 days supply | Qty: 30 | Fill #1

## 2015-09-26 MED FILL — QUETIAPINE FUMARATE 50 MG T: 50 | 30 days supply | Qty: 30 | Fill #1

## 2015-10-14 MED FILL — traZODone HCL 50 MG TABS: 50 | 30 days supply | Qty: 30 | Fill #1

## 2015-10-14 MED FILL — LORazepam 0.5 MG TABS: 0.5 | 30 days supply | Qty: 90 | Fill #0

## 2015-10-21 MED FILL — VENLAFAXINE HCL ER 75 MG CA: 75 | 30 days supply | Qty: 30 | Fill #2

## 2015-10-21 MED FILL — DIVALPROEX SOD ER 500 MG TA: 500 | 30 days supply | Qty: 30 | Fill #2

## 2015-10-21 MED FILL — PARoxetine HCL 20 MG TABS: 20 | 30 days supply | Qty: 30 | Fill #2

## 2015-11-04 ENCOUNTER — Ambulatory Visit (INDEPENDENT_AMBULATORY_CARE_PROVIDER_SITE_OTHER): Payer: 59 | Admitting: Psychiatry

## 2015-11-04 ENCOUNTER — Encounter (HOSPITAL_COMMUNITY): Payer: Self-pay | Admitting: Psychiatry

## 2015-11-04 VITALS — BP 130/100 | Ht 71.0 in | Wt 213.0 lb

## 2015-11-04 DIAGNOSIS — F329 Major depressive disorder, single episode, unspecified: Secondary | ICD-10-CM | POA: Diagnosis not present

## 2015-11-04 DIAGNOSIS — F32A Depression, unspecified: Secondary | ICD-10-CM

## 2015-11-04 MED ORDER — QUETIAPINE FUMARATE 50 MG PO TABS: 50.0000 mg | ORAL_TABLET | Freq: Every day | ORAL | Status: DC

## 2015-11-04 MED ORDER — LORAZEPAM 0.5 MG PO TABS
0.5000 mg | ORAL_TABLET | Freq: Three times a day (TID) | ORAL | Status: DC
Start: 2015-11-04 — End: 2016-03-06

## 2015-11-04 MED ORDER — VENLAFAXINE HCL ER 75 MG PO CP24: 75.0000 mg | ORAL_CAPSULE | Freq: Every day | ORAL | Status: DC

## 2015-11-04 MED ORDER — DIVALPROEX SODIUM ER 500 MG PO TB24: 500.0000 mg | ORAL_TABLET | Freq: Every day | ORAL | Status: DC

## 2015-11-04 MED ORDER — TRAZODONE HCL 50 MG PO TABS: 50.0000 mg | ORAL_TABLET | Freq: Every day | ORAL | Status: DC

## 2015-11-04 MED ORDER — PAROXETINE HCL 20 MG PO TABS: 20.0000 mg | ORAL_TABLET | Freq: Every day | ORAL | Status: DC

## 2015-11-04 MED FILL — QUETIAPINE FUMARATE 50 MG T: 50 | 30 days supply | Qty: 30 | Fill #0

## 2015-11-04 NOTE — Progress Notes (Signed)
Patient ID: Lawrence Bennett, male   DOB: February 07, 1956, 60 y.o.   MRN: [id number] Patient ID: Lawrence Bennett, male   DOB: Mar 30, 1956, 60 y.o.   MRN: [id number] Patient ID: Lawrence Bennett, male   DOB: 1955/12/24, 60 y.o.   MRN: [id number] Patient ID: Lawrence Bennett, male   DOB: 12-Jan-1956, 60 y.o.   MRN: [id number] Patient ID: Lawrence Bennett, male   DOB: Mar 23, 1956, 60 y.o.   MRN: [id number]  Psychiatric Adult Follow up  Patient Identification: Lawrence Bennett MRN:  [id number] Date of Evaluation:  11/04/2015 Referral Source: [facility] Chief Complaint:   Chief Complaint    Depression; Follow-up     Visit Diagnosis:    ICD-9-CM ICD-10-CM   1. Depression 311 F32.9    Diagnosis:   Patient Active Problem List   Diagnosis Date Noted  . Major depressive disorder, recurrent episode, moderate (HCC) [F33.1]   . Diverticulosis of colon without hemorrhage [K57.30] 05/02/2014  . Essential hypertension [I10] 04/15/2014  . Anxiety [F41.9] 04/15/2014  . Hearing loss [H91.90] 04/15/2014  . Dizziness [R42] 04/15/2014   History of Present Illness: This patient is a 60 year old married white male who lives with his wife and 3 children ages 32,15 and 54 in 20. 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  The patient presents with his wife today. He is very hard of hearing and it's difficult to get a good history from him but she filled in the gaps. She states that he's had depression for at least one year. He started worrying about his job being discontinued or that [name] might close about a year ago. He finally decided to retire early in November. He also had some medical problems going on such as loss of vision and loss of hearing. In 2012 he had colon resection for diverticulosis.  Since retiring in November his mental status has declined. He started staying in bed all the time being unable to function not attending to his ADLs.  He stopped eating and lost about 10 pounds. He was not interacting with anyone at all. His primary doctor had put him on Zoloft but it was not helpful. He had been on a low dose of Ativan for years as well. Finally in the end of May his wife brought him to [name] ED for evaluation and from there he was transferred to [facility] geropsychiatry unit.  While on the unit he was diagnosed with severe depression. He was started on a number of medicines including Paxil and Effexor, Seroquel trazodone and Depakote. He also had 4 treatments of ECT while in the hospital and to as an outpatient. His last one was in early June.  Since getting out of the hospital he seems to be doing somewhat better. He is getting out of bed every day and doing things around the house and yard. His family went on a trip to a [place] and he refused to go with them. States that he's had a lot of short-term memory loss since having ECT but his wife thinks his mood is much better. He's never had psychotic symptoms such as auditory or visual hallucinations or paranoia but his thoughts were somewhat disorganized. He has never been suicidal or homicidal. He does not drink or use drugs. His wife thinks that he slowly getting better but he still not the person he used to be. He complains of dry mouth  secondary to all the medications. His Depakote level has not been checked since he left the hospital  The patient returns after 3 months. He is here with his wife. He is generally doing better, getting out more. He is no longer staying in bed much. His mood is improved. However he continues to gain weight. His blood pressure was high again today and his wife promised me that she will get him into a primary doctor as soon as possible. This is the same scenario that we dealt with last visit. She was tearful and said they don't have much money for co-pays but under his insurance he should have a 0 co-pay for primary care. He still  is having short-term memory loss and this seems to date back to last summer when he had ECT and it really hasn't gotten much better. On the positive side he is not depressed and has been doing woodworking and is garage and seems to be enjoying life for the most part Elements:  Location:  Global. Quality:  Severe. Severity:  Severe. Timing:  Subchronic. Duration:  Approximately one year. Context:  Medical issues, retirement from work. Associated Signs/Symptoms: Depression Symptoms:  depressed mood, anhedonia, psychomotor retardation, feelings of worthlessness/guilt, difficulty concentrating, anxiety, panic attacks, loss of energy/fatigue, weight loss,  Anxiety Symptoms:  Excessive Worry, Panic Symptoms, Social Anxiety,   Past Medical History:  Past Medical History  Diagnosis Date  . Dyslipidemia   . Mild hypertension   . Anxiety   . Atrial contractions, premature   . Atherosclerosis of aorta ([place])   . Hyperlipidemia   . Depression   . Urinary hesitancy     Past Surgical History  Procedure Laterality Date  . Colon resection     Family History:  Family History  Problem Relation Age of Onset  . Stroke Father   . Bipolar disorder Father   . Hypertension Sister   . Depression Sister    Social History:   Social History   Social History  . Marital Status: Married    Spouse Name: N/A  . Number of Children: N/A  . Years of Education: N/A   Social History Main Topics  . Smoking status: Former Smoker -- 0.20 packs/day for 19 years    Types: Cigarettes  . Smokeless tobacco: Never Used  . Alcohol Use: No  . Drug Use: No  . Sexual Activity: Not Asked   Other Topics Concern  . None   Social History Narrative   Additional Social History: Patient grew up in [place]. He has 3 sisters. He states that he had a great childhood with no history of abuse or trauma. He finished high school and has one year of college. 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 He has 3 children ages 84 and 38 and his oldest daughter attends [employer]. He is concerned about finances given that he is paying for his daughter's college.  Musculoskeletal: Strength & Muscle Tone: within normal limits Gait & Station: normal Patient leans: N/A  Psychiatric Specialty Exam: Depression        Past medical history includes anxiety.   Anxiety Symptoms include nervous/anxious behavior.      Review of Systems  Constitutional: Positive for weight loss.  HENT: Positive for hearing loss.   Eyes: Positive for blurred vision.  Psychiatric/Behavioral: Positive for depression and memory loss. The patient is nervous/anxious.     Blood pressure 130/100, height 5\' 11"  (1.803 m), weight  213 lb (96.616 kg).Body mass index is 29.72 kg/(m^2).  General Appearance: Casual, Neat and Well Groomed  Eye Contact:  Good  Speech:  Slow  Volume:  Decreased  Mood:  good  Affect:  Bright  Thought Process:  Goal Directed  Orientation:  Full (Time, Place, and Person)  Thought Content:  Rumination  Suicidal Thoughts:  No  Homicidal Thoughts:  No  Memory:  Immediate;   Poor Recent;   Fair Remote;   Fair  Judgement:  Impaired  Insight:  Lacking  Psychomotor Activity:  Normal   Concentration:  Fair  Recall:  Poor  Fund of Knowledge:Good  Language: Good  Akathisia:  No  Handed:  Right  AIMS (if indicated):    Assets:  Communication Skills Desire for Improvement Resilience Social Support Talents/Skills  ADL's:  Intact  Cognition: WNL  Sleep:     Is the patient at risk to self?  No. Has the patient been a risk to self in the past 6 months?  No. Has the patient been a risk to self within the distant past?  No. Is the patient a risk to others?  No. Has the patient been a risk to others in the past 6 months?  No. Has the patient been a risk to others within the distant past?  No.  Allergies:  No Known Allergies Current Medications: Current  Outpatient Prescriptions  Medication Sig Dispense Refill  . amLODipine (NORVASC) 2.5 MG tablet     . carbamide peroxide (DEBROX) 6.5 % otic solution Apply every 2 weeks just before your Saturday shower 15 mL 0  . divalproex (DEPAKOTE ER) 500 MG 24 hr tablet Take 1 tablet (500 mg total) by mouth at bedtime. 30 tablet 2  . LORazepam (ATIVAN) 0.5 MG tablet Take 1 tablet (0.5 mg total) by mouth 3 (three) times daily. 90 tablet 2  . PARoxetine (PAXIL) 20 MG tablet Take 1 tablet (20 mg total) by mouth at bedtime. 30 tablet 2  . QUEtiapine (SEROQUEL) 50 MG tablet Take 1 tablet (50 mg total) by mouth at bedtime. 30 tablet 2  . traZODone (DESYREL) 50 MG tablet Take 1 tablet (50 mg total) by mouth at bedtime. 30 tablet 2  . venlafaxine XR (EFFEXOR-XR) 75 MG 24 hr capsule Take 1 capsule (75 mg total) by mouth daily with breakfast. 30 capsule 2   No current facility-administered medications for this visit.    Previous Psychotropic Medications: Yes   Substance Abuse History in the last 12 months:  No.  Consequences of Substance Abuse: NA  Medical Decision Making:  Established Problem, Stable/Improving (1), Review or order clinical lab tests (1), Review and summation of old records (2), Review or order medicine tests (1), Review of Medication Regimen & Side Effects (2) and Review of New Medication or Change in Dosage (2)  Treatment Plan Summary: Medication management  This patient recently went through hospital treatment for severe depression. Now he is on multiple psychiatric medications and for the most part he is doing better. I'm hoping over time we can taper off some of these as they can have additive side effects.  He will continue Depakote for mood stabilization, Paxil and Effexor for depression and  trazodone for sleep. He will continue Seroquel 50 mg at bedtime and return to see me in 3 months. His wife promises me he will get in with a primary doctor and get all of his lab work checked within  the next couple of weeks    ROSS,  [place] 5/5/20178:59 AM

## 2015-11-11 ENCOUNTER — Encounter: Payer: Self-pay | Admitting: Family Medicine

## 2015-11-11 ENCOUNTER — Ambulatory Visit (INDEPENDENT_AMBULATORY_CARE_PROVIDER_SITE_OTHER): Payer: 59 | Admitting: Family Medicine

## 2015-11-11 VITALS — BP 140/96 | HR 70 | Temp 97.0°F | Ht 71.0 in | Wt 212.6 lb

## 2015-11-11 DIAGNOSIS — R7309 Other abnormal glucose: Secondary | ICD-10-CM

## 2015-11-11 DIAGNOSIS — I10 Essential (primary) hypertension: Secondary | ICD-10-CM | POA: Diagnosis not present

## 2015-11-11 DIAGNOSIS — N400 Enlarged prostate without lower urinary tract symptoms: Secondary | ICD-10-CM | POA: Diagnosis not present

## 2015-11-11 DIAGNOSIS — F331 Major depressive disorder, recurrent, moderate: Secondary | ICD-10-CM

## 2015-11-11 DIAGNOSIS — Z0000 Encounter for general adult medical examination without abnormal findings: Secondary | ICD-10-CM | POA: Diagnosis not present

## 2015-11-11 HISTORY — DX: Other abnormal glucose: R73.09

## 2015-11-11 LAB — BAYER DCA HB A1C WAIVED: HB A1C (BAYER DCA - WAIVED): 5.5 % (ref <7.0)

## 2015-11-11 MED ORDER — AMLODIPINE BESYLATE 5 MG PO TABS: 5.0000 mg | ORAL_TABLET | Freq: Every day | ORAL | Status: DC

## 2015-11-11 MED ORDER — TAMSULOSIN HCL 0.4 MG PO CAPS: 0.4000 mg | ORAL_CAPSULE | Freq: Every day | ORAL | Status: DC

## 2015-11-11 MED FILL — TAMSULOSIN HCL 0.4 MG CAP: 0.4 | 30 days supply | Qty: 30 | Fill #0

## 2015-11-11 NOTE — Progress Notes (Signed)
   HPI  Patient presents today to establish care and discuss retention and BPH symptoms.  BPH Over last several months has had progressively worsening weak urinary stream and dribbling symptoms. No nocturia, no feeling of incomplete emptying.  Hypertension Diastolics consistently in the 90s, has been on amlodipine 2.5 mg for a while. No chest pain, dyspnea, palpitations, leg edema.  Depression, anxiety Treated by psychiatry, states he is doing very well.  He's also concerned about elevated glucose  He's had decreased hearing which is presumed to be noise induced hearing loss He had his years washed out by urgent care a few months ago.   PMH: Smoking status noted His past medical, surgical, social, family history reviewed and updated in EMR ROS: Per HPI  Objective: BP 140/96 mmHg  Pulse 70  Temp(Src) 97 F (36.1 C) (Oral)  Ht '5\' 11"'$  (1.803 m)  Wt 212 lb 9.6 oz (96.435 kg)  BMI 29.66 kg/m2 Gen: NAD, alert, cooperative with exam HEENT: NCAT, TMs obscured by cerumen on the right, partially obscured on the left, nares clear, oropharynx clear Neck: No tender lymphadenopathy, no thyromegaly CV: RRR, good S1/S2, no murmur Resp: CTABL, no wheezes, non-labored Abd: SNTND, BS present, no guarding or organomegaly Ext: No edema, warm Neuro: Alert and oriented, No gross deficits Rectal exam: Normal tone, prostate large, smooth, symmetric, normal texture  Assessment and plan:  # Essential hypertension Increase amlodipine to 5 mg, will likely have to go to 10 mg Follow-up 4-6 weeks   # Elevated glucose Last labs show elevated glucose of 122, I presumed this is fasting Checking labs, add A1c  # Major depression/anxiety Appreciate management by psychiatry, he is doing very well Checking Depakote level, will relay the information if Depakote is not ideal  # Cerumen impaction Washed out in clinic today Baseline noise induced hearing loss  # BPH Prostate large and  smooth Checking TSH Start Flomax  # Healthcare maintenance annual physical  Overall normal physical except for problems outlined above HCV, labs, discussed colonoscopy    Orders Placed This Encounter  Procedures  . PSA  . TSH  . CMP14+EGFR  . CBC with Differential  . Lipid Panel  . Valproic acid level  . Bayer DCA Hb A1c Waived  . Hepatitis C antibody    Meds ordered this encounter  Medications  . amLODipine (NORVASC) 5 MG tablet    Sig: Take 1 tablet (5 mg total) by mouth daily.    Dispense:  30 tablet    Refill:  3  . tamsulosin (FLOMAX) 0.4 MG CAPS capsule    Sig: Take 1 capsule (0.4 mg total) by mouth daily.    Dispense:  30 capsule    Refill:  [place], MD [place] 11/11/2015, 9:25 AM

## 2015-11-11 NOTE — Patient Instructions (Signed)
Great to meet you guys!  I have startedd flomax today for your urinary symptoms, 1 pill once a day   I have increased your amlodipine to 5 mg, we will likely need to go up to 10 mg but we will go slowly so it is better tolerated.   We will call within 1 week with labs.

## 2015-11-15 ENCOUNTER — Telehealth: Payer: Self-pay | Admitting: *Deleted

## 2015-11-15 LAB — CBC WITH DIFFERENTIAL/PLATELET

## 2015-11-15 LAB — LIPID PANEL
CHOL/HDL RATIO: 4.4 ratio (ref 0.0–5.0)
Cholesterol, Total: 232 mg/dL — ABNORMAL HIGH (ref 100–199)
HDL: 53 mg/dL (ref >39)
LDL CALC: 154 mg/dL — AB (ref 0–99)
TRIGLYCERIDES: 123 mg/dL (ref 0–149)
VLDL Cholesterol Cal: 25 mg/dL (ref 5–40)

## 2015-11-15 LAB — HEPATITIS C ANTIBODY: Hep C Virus Ab: <0.1 s/co ratio (ref 0.0–0.9)

## 2015-11-15 LAB — CMP14+EGFR
ALT: 21 IU/L (ref 0–44)
AST: 21 IU/L (ref 0–40)
Albumin/Globulin Ratio: 1.8 (ref 1.2–2.2)
Albumin: 4.1 g/dL (ref 3.6–4.8)
Alkaline Phosphatase: 76 IU/L (ref 39–117)
BILIRUBIN TOTAL: 0.3 mg/dL (ref 0.0–1.2)
BUN / CREAT RATIO: 14 (ref 10–24)
BUN: 15 mg/dL (ref 8–27)
CHLORIDE: 100 mmol/L (ref 96–106)
CO2: 23 mmol/L (ref 18–29)
CREATININE: 1.06 mg/dL (ref 0.76–1.27)
Calcium: 9.4 mg/dL (ref 8.6–10.2)
GFR calc non Af Amer: 75 mL/min/{1.73_m2} (ref >59)
GFR, EST AFRICAN AMERICAN: 87 mL/min/{1.73_m2} (ref >59)
GLOBULIN, TOTAL: 2.3 g/dL (ref 1.5–4.5)
GLUCOSE: 98 mg/dL (ref 65–99)
Potassium: 4.6 mmol/L (ref 3.5–5.2)
Sodium: 139 mmol/L (ref 134–144)
TOTAL PROTEIN: 6.4 g/dL (ref 6.0–8.5)

## 2015-11-15 LAB — PSA: Prostate Specific Ag, Serum: 2 ng/mL (ref 0.0–4.0)

## 2015-11-15 LAB — VALPROIC ACID LEVEL: Valproic Acid Lvl: 44 ug/mL — ABNORMAL LOW (ref 50–100)

## 2015-11-15 LAB — TSH: TSH: 1.43 u[IU]/mL (ref 0.450–4.500)

## 2015-11-15 MED ORDER — ATORVASTATIN CALCIUM 20 MG PO TABS: 20.0000 mg | ORAL_TABLET | Freq: Every day | ORAL | Status: DC

## 2015-11-15 MED FILL — ATORVASTATIN 20 MG TABLET: 20 | 90 days supply | Qty: 90 | Fill #0

## 2015-11-15 NOTE — Telephone Encounter (Signed)
Pt's wife notified of results Verbalizes understanding RX for Lipitor sent in to [facility] per Dr Wendi Snipes

## 2015-11-15 NOTE — Telephone Encounter (Signed)
-----   Message from Timmothy Euler, MD sent at 11/15/2015 11:56 AM EDT ----- Will you call about his labs? His cholesterol is elevated ("bad" cholesterol LDL is elevated to 154, goal is less than 100, good cholesterol HDL is good, total cholesterol 232 which is also elevated) and he likely warrants medication. If he would like to start Lipitor 20 mg now. If not I would like to follow-up in 1-2 months to discuss further. His prostate, thyroid, kidney function, liver function, and A1c were all very good. His valproic acid level was slightly low, I will for this to his psychiatrist, it is likely that he does not need a rapid follow-up for this, it's only slightly low. His blood counts were unable to be performed, it appears that they will offer re-collection without a charge His hepatitis C antibody was negative as expected, this was a screening test. Thanks! sam

## 2015-11-16 MED FILL — traZODone HCL 50 MG TABS: 50 | 30 days supply | Qty: 30 | Fill #2

## 2015-11-16 MED FILL — LORazepam 0.5 MG TABS: 0.5 | 30 days supply | Qty: 90 | Fill #1

## 2015-11-29 DIAGNOSIS — H348122 Central retinal vein occlusion, left eye, stable: Secondary | ICD-10-CM | POA: Diagnosis not present

## 2015-11-29 MED FILL — VENLAFAXINE HCL ER 75 MG CA: 75 | 30 days supply | Qty: 30 | Fill #0

## 2015-11-29 MED FILL — PARoxetine HCL 20 MG TABS: 20 | 30 days supply | Qty: 30 | Fill #0

## 2015-11-29 MED FILL — AMLODIPINE BESYLATE 5 MG TA: 5 | 30 days supply | Qty: 30 | Fill #0

## 2015-11-29 MED FILL — DIVALPROEX SOD ER 500 MG TA: 500 | 30 days supply | Qty: 30 | Fill #0

## 2015-12-02 MED FILL — QUETIAPINE FUMARATE 50 MG T: 50 | 30 days supply | Qty: 30 | Fill #1

## 2015-12-02 MED FILL — TAMSULOSIN HCL 0.4 MG CAP: 0.4 | 30 days supply | Qty: 30 | Fill #1

## 2015-12-14 MED FILL — LORazepam 0.5 MG TABS: 0.5 | 30 days supply | Qty: 90 | Fill #2

## 2015-12-14 MED FILL — traZODone HCL 50 MG TABS: 50 | 30 days supply | Qty: 30 | Fill #1

## 2015-12-16 ENCOUNTER — Ambulatory Visit: Payer: 59 | Admitting: Family Medicine

## 2015-12-16 DIAGNOSIS — Z0271 Encounter for disability determination: Secondary | ICD-10-CM

## 2015-12-16 DIAGNOSIS — Z029 Encounter for administrative examinations, unspecified: Secondary | ICD-10-CM

## 2015-12-20 ENCOUNTER — Ambulatory Visit (INDEPENDENT_AMBULATORY_CARE_PROVIDER_SITE_OTHER): Payer: 59 | Admitting: Family Medicine

## 2015-12-20 ENCOUNTER — Encounter: Payer: Self-pay | Admitting: Family Medicine

## 2015-12-20 VITALS — BP 137/90 | HR 83 | Temp 97.5°F | Ht 71.0 in | Wt 214.0 lb

## 2015-12-20 DIAGNOSIS — E785 Hyperlipidemia, unspecified: Secondary | ICD-10-CM

## 2015-12-20 DIAGNOSIS — N400 Enlarged prostate without lower urinary tract symptoms: Secondary | ICD-10-CM | POA: Diagnosis not present

## 2015-12-20 DIAGNOSIS — I10 Essential (primary) hypertension: Secondary | ICD-10-CM | POA: Diagnosis not present

## 2015-12-20 NOTE — Patient Instructions (Signed)
Great to see you!  Lets see you again in August to look at your cholesterol again.   Continue amlodipine at 5 mg  Please let me know if you would like to get a little more relieff from the enlarged prostate.

## 2015-12-20 NOTE — Progress Notes (Signed)
   HPI  Patient presents today here to follow-up for hypertension, BPH, and hyperlipidemia.  Hyperlipidemia Tolerating Lipitor easily. No myalgias.  BPH Seeing some modest improvement with Flomax Main symptoms are dribbling and weak stream.  Hypertension Blood pressure checked one time at home, 130/89 there No headache, chest pain, dyspnea, palpitations, leg edema Tolerating medication easily.  PMH: Smoking status noted ROS: Per HPI  Objective: BP 137/90 mmHg  Pulse 83  Temp(Src) 97.5 F (36.4 C) (Oral)  Ht 5\' 11"  (1.803 m)  Wt 214 lb (97.07 kg)  BMI 29.86 kg/m2 Gen: NAD, alert, cooperative with exam HEENT: NCAT CV: RRR, good S1/S2, no murmur Resp: CTABL, no wheezes, non-labored Ext: No edema, warm Neuro: Alert and oriented, No gross deficits  Assessment and plan:  # Hypertension Reasonable today Continue amlodipine at 5 mg  # Hyperlipidemia Continue Lipitor Recheck labs in 2 months  # BPH Some improvement with Flomax PSA is normal at 2.0 Consider Proscar if needed additional medication   Laroy Apple, MD [facility] 12/20/2015, 4:48 PM

## 2015-12-21 MED FILL — PARoxetine HCL 20 MG TABS: 20 | 30 days supply | Qty: 30 | Fill #1

## 2015-12-21 MED FILL — VENLAFAXINE HCL ER 75 MG CA: 75 | 30 days supply | Qty: 30 | Fill #1

## 2015-12-21 MED FILL — AMLODIPINE BESYLATE 5 MG TA: 5 | 30 days supply | Qty: 30 | Fill #1

## 2015-12-21 MED FILL — DIVALPROEX SOD ER 500 MG TA: 500 | 30 days supply | Qty: 30 | Fill #1

## 2016-01-09 MED FILL — QUETIAPINE FUMARATE 50 MG T: 50 | 30 days supply | Qty: 30 | Fill #2

## 2016-01-16 ENCOUNTER — Telehealth (HOSPITAL_COMMUNITY): Payer: Self-pay | Admitting: *Deleted

## 2016-01-16 MED FILL — TAMSULOSIN HCL 0.4 MG CAP: 0.4 | 30 days supply | Qty: 30 | Fill #2

## 2016-01-16 MED FILL — traZODone HCL 50 MG TABS: 50 | 30 days supply | Qty: 30 | Fill #2

## 2016-01-16 NOTE — Telephone Encounter (Signed)
Pt pharmacy requesting refills for pt Lorazepam 0.5mg . Per pt chart, medication last printed on 11-04-15 with 2 refills. Pt f/u appt is scheduled for 02-03-16. Called pt to verify if he's out of meds and lmtcb and office number provider.

## 2016-01-17 MED FILL — LORazepam 0.5 MG TABS: 0.5 | 30 days supply | Qty: 90 | Fill #0

## 2016-01-17 NOTE — Telephone Encounter (Signed)
You may call in one month supply 

## 2016-01-18 NOTE — Telephone Encounter (Signed)
Called pt pharmacy and spoke with [facility] about recent request for pt Lorazepam 0.5 mg. Per Velta Addison, pt picked up his medication on 01-17-16 and has 2 refills left on file. Informed Maryann, office will hold off on calling in refills for pt. Maryann verbalized understanding.

## 2016-01-23 MED FILL — DIVALPROEX SOD ER 500 MG TA: 500 | 30 days supply | Qty: 30 | Fill #2

## 2016-01-23 MED FILL — VENLAFAXINE HCL ER 75 MG CA: 75 | 30 days supply | Qty: 30 | Fill #2

## 2016-01-23 MED FILL — AMLODIPINE BESYLATE 5 MG TA: 5 | 30 days supply | Qty: 30 | Fill #2

## 2016-01-23 MED FILL — PARoxetine HCL 20 MG TABS: 20 | 30 days supply | Qty: 30 | Fill #2

## 2016-01-26 ENCOUNTER — Telehealth (HOSPITAL_COMMUNITY): Payer: Self-pay | Admitting: *Deleted

## 2016-01-26 NOTE — Telephone Encounter (Signed)
Provider out of office 02/03/16.

## 2016-01-30 ENCOUNTER — Telehealth (HOSPITAL_COMMUNITY): Payer: Self-pay | Admitting: *Deleted

## 2016-01-30 NOTE — Telephone Encounter (Signed)
Called pt to resch appt for Aug 4th duet o provider not being in office. Called pt mobile number on file and lmtcb and number provided. Called pt home number and message came up stating your call can not go through and to try again later.

## 2016-02-02 ENCOUNTER — Other Ambulatory Visit: Payer: Self-pay | Admitting: Psychiatry

## 2016-02-02 ENCOUNTER — Other Ambulatory Visit (HOSPITAL_COMMUNITY): Payer: Self-pay | Admitting: Psychiatry

## 2016-02-02 ENCOUNTER — Telehealth (HOSPITAL_COMMUNITY): Payer: Self-pay | Admitting: *Deleted

## 2016-02-02 MED ORDER — VENLAFAXINE HCL ER 75 MG PO CP24: 75.0000 mg | ORAL_CAPSULE | Freq: Every day | ORAL | 2 refills | Status: DC

## 2016-02-02 MED ORDER — PAROXETINE HCL 20 MG PO TABS: 20.0000 mg | ORAL_TABLET | Freq: Every day | ORAL | 2 refills | Status: DC

## 2016-02-02 MED ORDER — TRAZODONE HCL 50 MG PO TABS: 50.0000 mg | ORAL_TABLET | Freq: Every day | ORAL | 2 refills | Status: DC

## 2016-02-02 MED ORDER — DIVALPROEX SODIUM ER 500 MG PO TB24: 500.0000 mg | ORAL_TABLET | Freq: Every day | ORAL | 2 refills | Status: DC

## 2016-02-02 NOTE — Telephone Encounter (Signed)
Pt had an appt to f/u with provider 02-03-16 but was rescheduled due to provider being out of office. Pt was rescheduled to 02-06-16 but had to resch due to forgetting they had a pre planned vacation out of town. Pt is now rescheduled to return for f/u 03-06-16. Called pt pharmacy and spoke with Caryl Pina. Per Caryl Pina, pt Seroquel was last filled 01-09-2016 with 0 refill, Trazodone last filled 01-16-16 with 0 refill, Paxil last filled 01-23-16 with 0 refill, Depakote 01-23-16 with 0 refill and Effexor 01-23-16 with 0 refill. Informed pt wife with information received from Pharmacy. Pt wife would like to know if Dr. Harrington Challenger could approve for refills on medications with 0 refills so pt wont be without meds before his appt on 03-06-2016. Pt wife work number is [phone] and cell number [phone]. Pt pharmacy number is [phone].

## 2016-02-02 NOTE — Telephone Encounter (Signed)
sent 

## 2016-02-03 ENCOUNTER — Ambulatory Visit (HOSPITAL_COMMUNITY): Payer: Self-pay | Admitting: Psychiatry

## 2016-02-06 ENCOUNTER — Ambulatory Visit (HOSPITAL_COMMUNITY): Payer: Self-pay | Admitting: Psychiatry

## 2016-02-06 NOTE — Telephone Encounter (Signed)
noted 

## 2016-02-07 ENCOUNTER — Telehealth (HOSPITAL_COMMUNITY): Payer: Self-pay | Admitting: *Deleted

## 2016-02-07 MED ORDER — QUETIAPINE FUMARATE 50 MG PO TABS: 50.0000 mg | ORAL_TABLET | Freq: Every day | ORAL | 2 refills | Status: DC

## 2016-02-07 MED FILL — QUETIAPINE FUMARATE 50 MG T: 50 | 30 days supply | Qty: 30 | Fill #0

## 2016-02-07 NOTE — Telephone Encounter (Signed)
Pt wife called stating pt did not get refill for his Ativan and he is out.

## 2016-02-07 NOTE — Telephone Encounter (Signed)
Called pt pharmacy due to wife stating pt is out of refills for his Ativan per previous call. Per Gerald Stabs the pharmacist, pt have refills on file with them. Per Gerald Stabs, it's just too early for pt to fill his medication and the soonest he can refill will be 7 days form now. Per Gerald Stabs, pt last picked up his script on July 18th. Asked Gerald Stabs, the script that's on hold when it was written and Gerald Stabs stated 11-04-15 and it have 2 refills so pt still have 2 refills left as of today. Called pt wife at 5:42 and 5:43pm, due to her wanting office to call her back, and was unable to reach her. Office lmtcb and office number was provided.

## 2016-02-07 NOTE — Telephone Encounter (Signed)
Please call in 30 day supply

## 2016-02-07 NOTE — Telephone Encounter (Signed)
Called pt pharmacy due to wife stating pt is out of refills for his Ativan per previous call. Per Gerald Stabs the pharmacist, pt have refills on file with them. Per Gerald Stabs, it's just too early for pt to fill his medication and the soonest he can refill will be 7 days form now. Per Gerald Stabs, pt last picked up his script on July 18th. Asked Gerald Stabs, the script that's on hold when it was written and Gerald Stabs stated 11-04-15 and it have 2 refills so pt still have 2 refills left as of today. Called pt wife at 5:42 and 5:43pm, due to her wanting office to call her back, and was unable to reach her. Office lmtcb and office number was provided

## 2016-02-08 NOTE — Telephone Encounter (Signed)
Called pt wife and informed her with what pharmacy stated. Wife verbalized understanding.

## 2016-02-17 MED FILL — TAMSULOSIN HCL 0.4 MG CAP: 0.4 | 30 days supply | Qty: 30 | Fill #3

## 2016-02-17 MED FILL — traZODone HCL 50 MG TABS: 50 | 30 days supply | Qty: 30 | Fill #0

## 2016-02-17 MED FILL — ATORVASTATIN 20 MG TABLET: 20 | 90 days supply | Qty: 90 | Fill #1

## 2016-02-17 MED FILL — LORazepam 0.5 MG TABS: 0.5 | 30 days supply | Qty: 90 | Fill #1

## 2016-02-27 MED FILL — AMLODIPINE BESYLATE 5 MG TA: 5 | 30 days supply | Qty: 30 | Fill #3

## 2016-02-27 MED FILL — DIVALPROEX SOD ER 500 MG TA: 500 | 30 days supply | Qty: 30 | Fill #0

## 2016-02-27 MED FILL — PARoxetine HCL 20 MG TABS: 20 | 30 days supply | Qty: 30 | Fill #0

## 2016-02-27 MED FILL — VENLAFAXINE HCL ER 75 MG CA: 75 | 30 days supply | Qty: 30 | Fill #0

## 2016-03-06 ENCOUNTER — Encounter (HOSPITAL_COMMUNITY): Payer: Self-pay | Admitting: Psychiatry

## 2016-03-06 ENCOUNTER — Ambulatory Visit (INDEPENDENT_AMBULATORY_CARE_PROVIDER_SITE_OTHER): Payer: 59 | Admitting: Psychiatry

## 2016-03-06 VITALS — BP 110/93 | HR 86 | Ht 71.0 in | Wt 210.8 lb

## 2016-03-06 DIAGNOSIS — F329 Major depressive disorder, single episode, unspecified: Secondary | ICD-10-CM

## 2016-03-06 DIAGNOSIS — F32A Depression, unspecified: Secondary | ICD-10-CM

## 2016-03-06 MED ORDER — DIVALPROEX SODIUM ER 500 MG PO TB24: 500.0000 mg | ORAL_TABLET | Freq: Every day | ORAL | 2 refills | Status: DC

## 2016-03-06 MED ORDER — QUETIAPINE FUMARATE 50 MG PO TABS: 50.0000 mg | ORAL_TABLET | Freq: Every day | ORAL | 2 refills | Status: DC

## 2016-03-06 MED ORDER — TRAZODONE HCL 50 MG PO TABS: 50.0000 mg | ORAL_TABLET | Freq: Every day | ORAL | 2 refills | Status: DC

## 2016-03-06 MED ORDER — VENLAFAXINE HCL ER 75 MG PO CP24: 75.0000 mg | ORAL_CAPSULE | Freq: Every day | ORAL | 2 refills | Status: DC

## 2016-03-06 MED ORDER — LORAZEPAM 0.5 MG PO TABS: 0.5000 mg | ORAL_TABLET | Freq: Three times a day (TID) | ORAL | 2 refills | Status: DC

## 2016-03-06 MED FILL — QUETIAPINE FUMARATE 50 MG T: 50 | 30 days supply | Qty: 30 | Fill #0

## 2016-03-06 NOTE — Progress Notes (Signed)
Patient ID: Lawrence Bennett, male   DOB: 09-Apr-1956, 60 y.o.   MRN: [id number] Patient ID: Lawrence Bennett, male   DOB: [birth year], 60 y.o.   MRN: [id number] Patient ID: Lawrence Bennett, male   DOB: 05-07-1956, 60 y.o.   MRN: [id number] Patient ID: Lawrence Bennett, male   DOB: 03-05-1956, 60 y.o.   MRN: [id number] Patient ID: Lawrence Bennett, male   DOB: 1955/11/04, 60 y.o.   MRN: [id number]  Psychiatric Adult Follow up  Patient Identification: Lawrence Bennett MRN:  [id number] Date of Evaluation:  03/06/2016 Referral Source: [facility] Chief Complaint:   Chief Complaint    Anxiety; Depression; Follow-up     Visit Diagnosis:    ICD-9-CM ICD-10-CM   1. Depression 311 F32.9    Diagnosis:   Patient Active Problem List   Diagnosis Date Noted  . HLD (hyperlipidemia) [E78.5] 12/20/2015  . BPH (benign prostatic hyperplasia) [N40.0] 11/11/2015  . Elevated glucose [R73.09] 11/11/2015  . Healthcare maintenance [Z00.00] 11/11/2015  . Major depressive disorder, recurrent episode, moderate (HCC) [F33.1]   . Diverticulosis of colon without hemorrhage [K57.30] 05/02/2014  . Essential hypertension [I10] 04/15/2014  . Anxiety [F41.9] 04/15/2014  . Hearing loss [H91.90] 04/15/2014  . Dizziness [R42] 04/15/2014   History of Present Illness: This patient is a 60 year old married white male who lives with his wife and 3 children ages 79,15 and 50 in 70. 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  The patient presents with his wife today. He is very hard of hearing and it's difficult to get a good history from him but she filled in the gaps. She states that he's had depression for at least one year. He started worrying about his job being discontinued or that [name] might close about a year ago. He finally decided to retire early in November. He also had some medical problems going on such as loss of vision and loss of hearing. In  2012 he had colon resection for diverticulosis.  Since retiring in November his mental status has declined. He started staying in bed all the time being unable to function not attending to his ADLs. He stopped eating and lost about 10 pounds. He was not interacting with anyone at all. His primary doctor had put him on Zoloft but it was not helpful. He had been on a low dose of Ativan for years as well. Finally in the end of May his wife brought him to [name] ED for evaluation and from there he was transferred to [facility] geropsychiatry unit.  While on the unit he was diagnosed with severe depression. He was started on a number of medicines including Paxil and Effexor, Seroquel trazodone and Depakote. He also had 4 treatments of ECT while in the hospital and to as an outpatient. His last one was in early June.  Since getting out of the hospital he seems to be doing somewhat better. He is getting out of bed every day and doing things around the house and yard. His family went on a trip to a [place] and he refused to go with them. States that he's had a lot of short-term memory loss since having ECT but his wife thinks his mood is much better. He's never had psychotic symptoms such as auditory or visual hallucinations or paranoia but his thoughts were somewhat disorganized. He has never been suicidal or homicidal. He does not  drink or use drugs. His wife thinks that he slowly getting better but he still not the person he used to be. He complains of dry mouth secondary to all the medications. His Depakote level has not been checked since he left the hospital  The patient returns after 3 months. He is here with his wife. He is generally doing better, getting out more. He is no longer staying in bed much. His mood is improved. He is now seeing Dr. Wendi Snipes at [facility] for his primary care and is on medication for elevated lipids and blood pressure and BPH. His  blood pressure still a bit high today. For the most part he is getting out and doing things but he feels very tired in the mornings and has to go back to sleep. I suggested he leave off the morning lorazepam and just take the next 2 through the day and he is willing to give this a try. His wife is very stressed because he did not get disability and they're finances are extremely limited. I encouraged him to apply again and get an attorney this time. Elements:  Location:  Global. Quality:  Severe. Severity:  Severe. Timing:  Subchronic. Duration:  Approximately one year. Context:  Medical issues, retirement from work. Associated Signs/Symptoms: Depression Symptoms:  depressed mood, anhedonia, psychomotor retardation, feelings of worthlessness/guilt, difficulty concentrating, anxiety, panic attacks, loss of energy/fatigue, weight loss,  Anxiety Symptoms:  Excessive Worry, Panic Symptoms, Social Anxiety,   Past Medical History:  Past Medical History:  Diagnosis Date  . Anxiety   . Atherosclerosis of aorta ([place])   . Atrial contractions, premature   . Depression   . Dyslipidemia   . Hyperlipidemia   . Mild hypertension   . Urinary hesitancy     Past Surgical History:  Procedure Laterality Date  . COLON RESECTION     Family History:  Family History  Problem Relation Age of Onset  . Stroke Father   . Bipolar disorder Father   . Hypertension Sister   . Depression Sister   . COPD Mother    Social History:   Social History   Social History  . Marital status: Married    Spouse name: N/A  . Number of children: N/A  . Years of education: N/A   Social History Main Topics  . Smoking status: Former Smoker    Packs/day: 0.20    Years: 19.00    Types: Cigarettes  . Smokeless tobacco: Never Used  . Alcohol use No  . Drug use: No  . Sexual activity: Not Asked   Other Topics Concern  . None   Social History Narrative  . None   Additional Social History: Patient  grew up in [place]. He has 3 sisters. He states that he had a great childhood with no history of abuse or trauma. He finished high school and has one year of college. 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 He has 3 children ages 35 and 66 and his oldest daughter attends [employer]. He is concerned about finances given that he is paying for his daughter's college.  Musculoskeletal: Strength & Muscle Tone: within normal limits Gait & Station: normal Patient leans: N/A  Psychiatric Specialty Exam: Depression         Past medical history includes anxiety.   Anxiety  Symptoms include nervous/anxious behavior.      Review of Systems  Constitutional: Positive for weight loss.  HENT: Positive for hearing loss.   Eyes: Positive for blurred vision.  Psychiatric/Behavioral: Positive for depression and memory loss. The patient is nervous/anxious.     Blood pressure (!) 110/93, pulse 86, height 5\' 11"  (1.803 m), weight 210 lb 12.8 oz (95.6 kg).Body mass index is 29.4 kg/m.  General Appearance: Casual, Neat and Well Groomed  Eye Contact:  Good  Speech:  Slow  Volume:  Decreased  Mood:  good  Affect:  Bright  Thought Process:  Goal Directed  Orientation:  Full (Time, Place, and Person)  Thought Content:  Rumination  Suicidal Thoughts:  No  Homicidal Thoughts:  No  Memory:  Immediate;   Poor Recent;   Fair Remote;   Fair  Judgement:  Impaired  Insight:  Lacking  Psychomotor Activity:  Normal   Concentration:  Fair  Recall:  Poor  Fund of Knowledge:Good  Language: Good  Akathisia:  No  Handed:  Right  AIMS (if indicated):    Assets:  Communication Skills Lawrence for Improvement Resilience Social Support Talents/Skills  ADL's:  Intact  Cognition: WNL  Sleep:     Is the patient at risk to self?  No. Has the patient been a risk to self in the past 6 months?  No. Has the patient been a risk to self within the distant past?  No. Is the  patient a risk to others?  No. Has the patient been a risk to others in the past 6 months?  No. Has the patient been a risk to others within the distant past?  No.  Allergies:  No Known Allergies Current Medications: Current Outpatient Prescriptions  Medication Sig Dispense Refill  . amLODipine (NORVASC) 5 MG tablet Take 1 tablet (5 mg total) by mouth daily. 30 tablet 3  . atorvastatin (LIPITOR) 20 MG tablet Take 1 tablet (20 mg total) by mouth daily. 90 tablet 1  . carbamide peroxide (DEBROX) 6.5 % otic solution Apply every 2 weeks just before your Saturday shower 15 mL 0  . divalproex (DEPAKOTE ER) 500 MG 24 hr tablet Take 1 tablet (500 mg total) by mouth at bedtime. 30 tablet 2  . LORazepam (ATIVAN) 0.5 MG tablet Take 1 tablet (0.5 mg total) by mouth 3 (three) times daily. 90 tablet 2  . PARoxetine (PAXIL) 20 MG tablet Take 1 tablet (20 mg total) by mouth at bedtime. 30 tablet 2  . QUEtiapine (SEROQUEL) 50 MG tablet Take 1 tablet (50 mg total) by mouth at bedtime. 30 tablet 2  . tamsulosin (FLOMAX) 0.4 MG CAPS capsule Take 1 capsule (0.4 mg total) by mouth daily. 30 capsule 3  . traZODone (DESYREL) 50 MG tablet Take 1 tablet (50 mg total) by mouth at bedtime. 30 tablet 2  . venlafaxine XR (EFFEXOR-XR) 75 MG 24 hr capsule Take 1 capsule (75 mg total) by mouth daily with breakfast. 30 capsule 2   No current facility-administered medications for this visit.     Previous Psychotropic Medications: Yes   Substance Abuse History in the last 12 months:  No.  Consequences of Substance Abuse: NA  Medical Decision Making:  Established Problem, Stable/Improving (1), Review or order clinical lab tests (1), Review and summation of old records (2), Review or order medicine tests (1), Review of Medication Regimen & Side Effects (2) and Review of New Medication or Change in Dosage (2)  Treatment Plan Summary: Medication management  This patient recently went through hospital treatment for severe  depression. Now he is on multiple psychiatric medications  and for the most part he is doing better. I'm hoping over time we can taper off some of these as they can have additive side effects.  He will continue Depakote for mood stabilization, Paxil and Effexor for depression and  trazodone for sleep. He will continue Seroquel 50 mg at bedtime.he will try cutting down lorazepam to 0.5 mg in the afternoon and evening and not take any in the morning due to drowsiness  and return to see me in 3 months.     Brittin Belnap 9/5/20174:00 PM

## 2016-03-06 NOTE — Patient Instructions (Signed)
Leave off the am dose of lorazepam and see how you do

## 2016-03-15 ENCOUNTER — Other Ambulatory Visit: Payer: Self-pay

## 2016-03-15 DIAGNOSIS — N400 Enlarged prostate without lower urinary tract symptoms: Secondary | ICD-10-CM

## 2016-03-15 MED ORDER — TAMSULOSIN HCL 0.4 MG PO CAPS: 0.4000 mg | ORAL_CAPSULE | Freq: Every day | ORAL | 2 refills | Status: DC

## 2016-03-15 MED FILL — traZODone HCL 50 MG TABS: 50 | 30 days supply | Qty: 30 | Fill #1

## 2016-03-15 MED FILL — TAMSULOSIN HCL 0.4 MG CAP: 0.4 | 30 days supply | Qty: 30 | Fill #0

## 2016-03-16 MED FILL — LORazepam 0.5 MG TABS: 0.5 | 30 days supply | Qty: 90 | Fill #2

## 2016-03-23 ENCOUNTER — Ambulatory Visit (INDEPENDENT_AMBULATORY_CARE_PROVIDER_SITE_OTHER): Payer: 59 | Admitting: Family Medicine

## 2016-03-23 ENCOUNTER — Encounter: Payer: Self-pay | Admitting: Family Medicine

## 2016-03-23 VITALS — BP 131/82 | HR 74 | Temp 97.7°F | Ht 71.0 in | Wt 212.8 lb

## 2016-03-23 DIAGNOSIS — Z23 Encounter for immunization: Secondary | ICD-10-CM

## 2016-03-23 DIAGNOSIS — Z8673 Personal history of transient ischemic attack (TIA), and cerebral infarction without residual deficits: Secondary | ICD-10-CM

## 2016-03-23 DIAGNOSIS — E785 Hyperlipidemia, unspecified: Secondary | ICD-10-CM

## 2016-03-23 DIAGNOSIS — I10 Essential (primary) hypertension: Secondary | ICD-10-CM

## 2016-03-23 DIAGNOSIS — R29898 Other symptoms and signs involving the musculoskeletal system: Secondary | ICD-10-CM

## 2016-03-23 MED ORDER — ASPIRIN EC 81 MG PO TBEC: 81.0000 mg | DELAYED_RELEASE_TABLET | Freq: Every day | ORAL | 3 refills | Status: DC

## 2016-03-23 NOTE — Progress Notes (Signed)
   HPI  Patient presents today here for follow-up chronic medical conditions with left leg weakness as well.  Hypertension Good medication compliance, no dizziness  HLD Tolerating statin without issue Watching diet moderately Active but not exercising  L Leg weakness  Describes 3-6 months of slowly onsetting subtle left leg weakness.  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 Over the last several months he has noticed he has 2 brace himself on a nearby object or use something to help pull himself up. No left arm weakness, no other weakness, numbness, or tingling appreciated.  Has history of stroke, however it's a left-sided basal ganglia stroke.   PMH: Smoking status noted ROS: Per HPI  Objective: BP 131/82   Pulse 74   Temp 97.7 F (36.5 C) (Oral)   Ht _0  (1.803 m)   Wt 212 lb 12.8 oz (96.5 kg)   BMI 29.68 kg/m  Gen: NAD, alert, cooperative with exam HEENT: NCAT CV: RRR, good S1/S2, no murmur Resp: CTABL, no wheezes, non-labored Abd: SNTND, BS present, no guarding or organomegaly Ext: No edema, warm Neuro: Alert and oriented, drink 5/5 and sensation intact in all 4 extremities, 2+ patellar tendon reflexes bilaterally  Assessment and plan:  # Leg weakness Unclear etiology, I doubt central lesion, however he does have history of left-sided basal ganglia infarcts I offered repeat CT or MRI, however with only subtle signs he would like to defer for now. Most likely deconditioning Close follow-up as needed  # History of stroke Given history of left-sided basal ganglia stroke I have continued his Lipitor, also started him on a daily aspirin today.   # Hyperlipidemia Tolerating statin well, repeat labs LDL less than 70  # HTN Well-controlled on amlodipine, continue With history of stroke consider ACE inhibitor if additional agent as needed  Flu shot given, counseling  provided  Orders Placed This Encounter  Procedures  . Flu Vaccine QUAD 36+ mos IM  . CMP14+EGFR  . Lipid panel  . CBC with Differential    Meds ordered this encounter  Medications  . aspirin EC 81 MG tablet    Sig: Take 1 tablet (81 mg total) by mouth daily.    Dispense:  90 tablet    Refill:  [place], MD [facility] 03/23/2016, 11:46 AM

## 2016-03-23 NOTE — Patient Instructions (Signed)
Great to see you!  We il call with your results within 1 week  Lets see you again in 3 months

## 2016-03-24 LAB — CBC WITH DIFFERENTIAL/PLATELET
BASOS ABS: 0 10*3/uL (ref 0.0–0.2)
Basos: 1 %
EOS (ABSOLUTE): 0.2 10*3/uL (ref 0.0–0.4)
Eos: 3 %
HEMOGLOBIN: 15 g/dL (ref 12.6–17.7)
Hematocrit: 42.6 % (ref 37.5–51.0)
Immature Grans (Abs): 0 10*3/uL (ref 0.0–0.1)
Immature Granulocytes: 0 %
LYMPHS ABS: 2.6 10*3/uL (ref 0.7–3.1)
Lymphs: 41 %
MCH: 30.9 pg (ref 26.6–33.0)
MCHC: 35.2 g/dL (ref 31.5–35.7)
MCV: 88 fL (ref 79–97)
MONOCYTES: 11 %
MONOS ABS: 0.7 10*3/uL (ref 0.1–0.9)
Neutrophils Absolute: 2.7 10*3/uL (ref 1.4–7.0)
Neutrophils: 44 %
PLATELETS: 262 10*3/uL (ref 150–379)
RBC: 4.86 x10E6/uL (ref 4.14–5.80)
RDW: 14.8 % (ref 12.3–15.4)
WBC: 6.3 10*3/uL (ref 3.4–10.8)

## 2016-03-24 LAB — LIPID PANEL
CHOL/HDL RATIO: 3.1 ratio (ref 0.0–5.0)
Cholesterol, Total: 165 mg/dL (ref 100–199)
HDL: 53 mg/dL (ref >39)
LDL Calculated: 94 mg/dL (ref 0–99)
Triglycerides: 88 mg/dL (ref 0–149)
VLDL Cholesterol Cal: 18 mg/dL (ref 5–40)

## 2016-03-24 LAB — CMP14+EGFR
A/G RATIO: 1.6 (ref 1.2–2.2)
ALT: 22 IU/L (ref 0–44)
AST: 21 IU/L (ref 0–40)
Albumin: 4.1 g/dL (ref 3.6–4.8)
Alkaline Phosphatase: 79 IU/L (ref 39–117)
BUN/Creatinine Ratio: 15 (ref 10–24)
BUN: 16 mg/dL (ref 8–27)
Bilirubin Total: 0.3 mg/dL (ref 0.0–1.2)
CALCIUM: 9.2 mg/dL (ref 8.6–10.2)
CO2: 21 mmol/L (ref 18–29)
Chloride: 101 mmol/L (ref 96–106)
Creatinine, Ser: 1.06 mg/dL (ref 0.76–1.27)
GFR, EST AFRICAN AMERICAN: 87 mL/min/{1.73_m2} (ref >59)
GFR, EST NON AFRICAN AMERICAN: 75 mL/min/{1.73_m2} (ref >59)
GLOBULIN, TOTAL: 2.6 g/dL (ref 1.5–4.5)
Glucose: 100 mg/dL — ABNORMAL HIGH (ref 65–99)
POTASSIUM: 4.7 mmol/L (ref 3.5–5.2)
Sodium: 139 mmol/L (ref 134–144)
TOTAL PROTEIN: 6.7 g/dL (ref 6.0–8.5)

## 2016-03-26 ENCOUNTER — Other Ambulatory Visit: Payer: Self-pay | Admitting: Family Medicine

## 2016-03-26 MED ORDER — ATORVASTATIN CALCIUM 40 MG PO TABS: 40.0000 mg | ORAL_TABLET | Freq: Every day | ORAL | 3 refills | Status: DC

## 2016-03-26 MED FILL — ATORVASTATIN 40 MG TABLET: 40 | 90 days supply | Qty: 90 | Fill #0

## 2016-03-27 ENCOUNTER — Other Ambulatory Visit: Payer: Self-pay | Admitting: Family Medicine

## 2016-03-27 DIAGNOSIS — I10 Essential (primary) hypertension: Secondary | ICD-10-CM

## 2016-03-27 MED FILL — PARoxetine HCL 20 MG TABS: 20 | 30 days supply | Qty: 30 | Fill #1

## 2016-03-27 MED FILL — DIVALPROEX SOD ER 500 MG TA: 500 | 30 days supply | Qty: 30 | Fill #0

## 2016-03-27 MED FILL — AMLODIPINE BESYLATE 5 MG TA: 5 | 30 days supply | Qty: 30 | Fill #0

## 2016-03-27 MED FILL — VENLAFAXINE HCL ER 75 MG CA: 75 | 30 days supply | Qty: 30 | Fill #1

## 2016-04-06 MED FILL — QUETIAPINE FUMARATE 50 MG T: 50 | 30 days supply | Qty: 30 | Fill #1

## 2016-04-13 MED FILL — traZODone HCL 50 MG TABS: 50 | 30 days supply | Qty: 30 | Fill #2

## 2016-04-13 MED FILL — TAMSULOSIN HCL 0.4 MG CAP: 0.4 | 30 days supply | Qty: 30 | Fill #1

## 2016-04-13 MED FILL — LORazepam 0.5 MG TABS: 0.5 | 30 days supply | Qty: 90 | Fill #0

## 2016-04-26 MED FILL — PARoxetine HCL 20 MG TABS: 20 | 30 days supply | Qty: 30 | Fill #2

## 2016-04-26 MED FILL — AMLODIPINE BESYLATE 5 MG TA: 5 | 30 days supply | Qty: 30 | Fill #1

## 2016-04-26 MED FILL — VENLAFAXINE HCL ER 75 MG CA: 75 | 30 days supply | Qty: 30 | Fill #2

## 2016-04-26 MED FILL — DIVALPROEX SOD ER 500 MG TA: 500 | 30 days supply | Qty: 30 | Fill #1

## 2016-05-03 MED FILL — QUETIAPINE FUMARATE 50 MG T: 50 | 30 days supply | Qty: 30 | Fill #2

## 2016-05-15 MED FILL — traZODone HCL 50 MG TABS: 50 | 30 days supply | Qty: 30 | Fill #0

## 2016-05-15 MED FILL — TAMSULOSIN HCL 0.4 MG CAP: 0.4 | 30 days supply | Qty: 30 | Fill #2

## 2016-05-16 ENCOUNTER — Other Ambulatory Visit (HOSPITAL_COMMUNITY): Payer: Self-pay | Admitting: Psychiatry

## 2016-05-16 ENCOUNTER — Telehealth (HOSPITAL_COMMUNITY): Payer: Self-pay | Admitting: *Deleted

## 2016-05-16 MED ORDER — PAROXETINE HCL 20 MG PO TABS: 20.0000 mg | ORAL_TABLET | Freq: Every day | ORAL | 2 refills | Status: DC

## 2016-05-16 MED FILL — PARoxetine HCL 20 MG TABS: 20 | 30 days supply | Qty: 30 | Fill #0

## 2016-05-16 MED FILL — AMLODIPINE BESYLATE 5 MG TA: 5 | 30 days supply | Qty: 30 | Fill #2

## 2016-05-16 NOTE — Telephone Encounter (Signed)
noted 

## 2016-05-16 NOTE — Telephone Encounter (Signed)
Pt pharmacy requesting refills for pt Paroxetine HCL 20 mg. Pt medication last filled on 02-02-2016 with 30 tabs 2 refills. Pt f/u appt is 06-01-2016. Pt last appt was 03-06-2016. Pt pharmacy number is [phone].

## 2016-05-16 NOTE — Telephone Encounter (Signed)
sent 

## 2016-05-23 ENCOUNTER — Telehealth (HOSPITAL_COMMUNITY): Payer: Self-pay | Admitting: *Deleted

## 2016-05-23 MED FILL — VENLAFAXINE HCL ER 75 MG CA: 75 | 30 days supply | Qty: 30 | Fill #0

## 2016-05-23 MED FILL — DIVALPROEX SOD ER 500 MG TA: 500 | 30 days supply | Qty: 30 | Fill #2

## 2016-05-23 MED FILL — LORazepam 0.5 MG TABS: 0.5 | 30 days supply | Qty: 90 | Fill #1

## 2016-05-23 NOTE — Telephone Encounter (Signed)
left voice message, provider out of office 06/01/16.

## 2016-05-28 ENCOUNTER — Telehealth (HOSPITAL_COMMUNITY): Payer: Self-pay | Admitting: *Deleted

## 2016-05-28 NOTE — Telephone Encounter (Signed)
patient was scheduled 06/01/16.  due to provider out of office, rescheduled to 06/13/16.   He will need refill of his medications.

## 2016-05-28 NOTE — Telephone Encounter (Signed)
left another voice message, provider out of office 06/01/16.

## 2016-05-29 NOTE — Telephone Encounter (Signed)
patient was scheduled 06/01/16. Due to provider out of office, rescheduled to 06/13/16.  All of pt medications were filled on 03-06-2016 with only 2 refills. Pt need refills for his Effexor XR, Trazodone, Seroquel, Paxil, Depakote ER and Ativan. This is Dr. Harrington Challenger pt.

## 2016-05-30 ENCOUNTER — Other Ambulatory Visit (HOSPITAL_COMMUNITY): Payer: Self-pay | Admitting: Psychiatry

## 2016-05-30 MED ORDER — TRAZODONE HCL 50 MG PO TABS: 50.0000 mg | ORAL_TABLET | Freq: Every day | ORAL | 0 refills | Status: DC

## 2016-05-30 MED ORDER — DIVALPROEX SODIUM ER 500 MG PO TB24: 500.0000 mg | ORAL_TABLET | Freq: Every day | ORAL | 0 refills | Status: DC

## 2016-05-30 MED ORDER — QUETIAPINE FUMARATE 50 MG PO TABS: 50.0000 mg | ORAL_TABLET | Freq: Every day | ORAL | 0 refills | Status: DC

## 2016-05-30 MED ORDER — VENLAFAXINE HCL ER 75 MG PO CP24: 75.0000 mg | ORAL_CAPSULE | Freq: Every day | ORAL | 0 refills | Status: DC

## 2016-05-30 MED ORDER — PAROXETINE HCL 20 MG PO TABS: 20.0000 mg | ORAL_TABLET | Freq: Every day | ORAL | 0 refills | Status: DC

## 2016-05-30 MED FILL — QUETIAPINE FUMARATE 50 MG T: 50 | 30 days supply | Qty: 30 | Fill #0

## 2016-05-30 NOTE — Telephone Encounter (Signed)
noted 

## 2016-05-30 NOTE — Telephone Encounter (Signed)
Ordered all medication for one month except Ativan (Per [facility] database, patient dispensed lorazepam on 11/22 for a month and the patient has one refill left).

## 2016-06-01 ENCOUNTER — Ambulatory Visit (HOSPITAL_COMMUNITY): Payer: Self-pay | Admitting: Psychiatry

## 2016-06-04 ENCOUNTER — Ambulatory Visit (INDEPENDENT_AMBULATORY_CARE_PROVIDER_SITE_OTHER): Payer: 59 | Admitting: Psychiatry

## 2016-06-04 ENCOUNTER — Encounter (HOSPITAL_COMMUNITY): Payer: Self-pay | Admitting: Psychiatry

## 2016-06-04 VITALS — BP 140/88 | Ht 71.0 in | Wt 216.0 lb

## 2016-06-04 DIAGNOSIS — Z823 Family history of stroke: Secondary | ICD-10-CM

## 2016-06-04 DIAGNOSIS — F322 Major depressive disorder, single episode, severe without psychotic features: Secondary | ICD-10-CM | POA: Diagnosis not present

## 2016-06-04 DIAGNOSIS — Z87891 Personal history of nicotine dependence: Secondary | ICD-10-CM | POA: Diagnosis not present

## 2016-06-04 DIAGNOSIS — Z8249 Family history of ischemic heart disease and other diseases of the circulatory system: Secondary | ICD-10-CM

## 2016-06-04 MED ORDER — VENLAFAXINE HCL ER 75 MG PO CP24: 75.0000 mg | ORAL_CAPSULE | Freq: Every day | ORAL | 2 refills | Status: DC

## 2016-06-04 MED ORDER — TRAZODONE HCL 50 MG PO TABS: 50.0000 mg | ORAL_TABLET | Freq: Every day | ORAL | 2 refills | Status: DC

## 2016-06-04 MED ORDER — DIVALPROEX SODIUM ER 500 MG PO TB24: 500.0000 mg | ORAL_TABLET | Freq: Every day | ORAL | 2 refills | Status: DC

## 2016-06-04 MED ORDER — LORAZEPAM 0.5 MG PO TABS: 0.5000 mg | ORAL_TABLET | Freq: Three times a day (TID) | ORAL | 2 refills | Status: DC

## 2016-06-04 MED ORDER — QUETIAPINE FUMARATE 50 MG PO TABS: 50.0000 mg | ORAL_TABLET | Freq: Every day | ORAL | 2 refills | Status: DC

## 2016-06-04 NOTE — Progress Notes (Signed)
Patient ID: Lawrence Bennett, male   DOB: 1955-08-10, 60 y.o.   MRN: [id number] Patient ID: Lawrence Bennett, male   DOB: May 15, 1956, 60 y.o.   MRN: [id number] Patient ID: Lawrence Bennett, male   DOB: August 10, 1955, 60 y.o.   MRN: [id number] Patient ID: Lawrence Bennett, male   DOB: Aug 30, 1955, 60 y.o.   MRN: [id number] Patient ID: Lawrence Bennett, male   DOB: Nov 08, 1955, 60 y.o.   MRN: [id number]  Psychiatric Adult Follow up  Patient Identification: Lawrence Bennett MRN:  [id number] Date of Evaluation:  06/04/2016 Referral Source: [facility] Chief Complaint:   Chief Complaint    Depression; Anxiety; Follow-up     Visit Diagnosis:    ICD-9-CM ICD-10-CM   1. Severe single current episode of major depressive disorder, without psychotic features [facility]) 296.23 F32.2    Diagnosis:   Patient Active Problem List   Diagnosis Date Noted  . History of stroke [Z86.73] 03/23/2016  . HLD (hyperlipidemia) [E78.5] 12/20/2015  . BPH (benign prostatic hyperplasia) [N40.0] 11/11/2015  . Elevated glucose [R73.09] 11/11/2015  . Healthcare maintenance [Z00.00] 11/11/2015  . Major depressive disorder, recurrent episode, moderate (HCC) [F33.1]   . Diverticulosis of colon without hemorrhage [K57.30] 05/02/2014  . Essential hypertension [I10] 04/15/2014  . Anxiety [F41.9] 04/15/2014  . Hearing loss [H91.90] 04/15/2014  . Dizziness [R42] 04/15/2014   History of Present Illness: This patient is a 60 year old married white male who lives with his wife and 3 children ages 9,15 and 41 in 50. 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  The patient presents with his wife today. He is very hard of hearing and it's difficult to get a good history from him but she filled in the gaps. She states that he's had depression for at least one year. He started worrying about his job being discontinued or that [name] might close about a year ago. He finally  decided to retire early in November. He also had some medical problems going on such as loss of vision and loss of hearing. In 2012 he had colon resection for diverticulosis.  Since retiring in November his mental status has declined. He started staying in bed all the time being unable to function not attending to his ADLs. He stopped eating and lost about 10 pounds. He was not interacting with anyone at all. His primary doctor had put him on Zoloft but it was not helpful. He had been on a low dose of Ativan for years as well. Finally in the end of May his wife brought him to [name] ED for evaluation and from there he was transferred to [facility] geropsychiatry unit.  While on the unit he was diagnosed with severe depression. He was started on a number of medicines including Paxil and Effexor, Seroquel trazodone and Depakote. He also had 4 treatments of ECT while in the hospital and to as an outpatient. His last one was in early June.  Since getting out of the hospital he seems to be doing somewhat better. He is getting out of bed every day and doing things around the house and yard. His family went on a trip to a [place] and he refused to go with them. States that he's had a lot of short-term memory loss since having ECT but his wife thinks his mood is much better. He's never had psychotic symptoms such as auditory or visual hallucinations  or paranoia but his thoughts were somewhat disorganized. He has never been suicidal or homicidal. He does not drink or use drugs. His wife thinks that he slowly getting better but he still not the person he used to be. He complains of dry mouth secondary to all the medications. His Depakote level has not been checked since he left the hospital  The patient returns after 3 months. He is here with his wife. He is generally doing better, getting out more. He is no longer staying in bed much. His mood is improved. He is taking his lorazepam later in  the morning so is not so groggy throughout the day. He is sleeping well and is doing a lot of projects around the house and ask excited for Christmas. Elements:  Location:  Global. Quality:  Severe. Severity:  Severe. Timing:  Subchronic. Duration:  Approximately one year. Context:  Medical issues, retirement from work. Associated Signs/Symptoms: Depression Symptoms:  depressed mood, anhedonia, psychomotor retardation, feelings of worthlessness/guilt, difficulty concentrating, anxiety, panic attacks, loss of energy/fatigue, weight loss,  Anxiety Symptoms:  Excessive Worry, Panic Symptoms, Social Anxiety,   Past Medical History:  Past Medical History:  Diagnosis Date  . Anxiety   . Atherosclerosis of aorta ([place])   . Atrial contractions, premature   . Depression   . Dyslipidemia   . Hyperlipidemia   . Mild hypertension   . Urinary hesitancy     Past Surgical History:  Procedure Laterality Date  . COLON RESECTION     Family History:  Family History  Problem Relation Age of Onset  . Stroke Father   . Bipolar disorder Father   . Hypertension Sister   . Depression Sister   . COPD Mother    Social History:   Social History   Social History  . Marital status: Married    Spouse name: N/A  . Number of children: N/A  . Years of education: N/A   Social History Main Topics  . Smoking status: Former Smoker    Packs/day: 0.20    Years: 19.00    Types: Cigarettes  . Smokeless tobacco: Never Used  . Alcohol use No  . Drug use: No  . Sexual activity: Not Asked   Other Topics Concern  . None   Social History Narrative  . None   Additional Social History: Patient grew up in [place]. He has 3 sisters. He states that he had a great childhood with no history of abuse or trauma. He finished high school and has one year of college. 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 He has 3 children ages 62 and 22 and his oldest  daughter attends [employer]. He is concerned about finances given that he is paying for his daughter's college.  Musculoskeletal: Strength & Muscle Tone: within normal limits Gait & Station: normal Patient leans: N/A  Psychiatric Specialty Exam: Anxiety  Symptoms include nervous/anxious behavior.    Depression         Past medical history includes anxiety.     Review of Systems  Constitutional: Positive for weight loss.  HENT: Positive for hearing loss.   Eyes: Positive for blurred vision.  Psychiatric/Behavioral: Positive for depression and memory loss. The patient is nervous/anxious.     Blood pressure 140/88, height 5\' 11"  (1.803 m), weight 216 lb (98 kg).Body mass index is 30.13 kg/m.  General Appearance: Casual, Neat and Well Groomed  Eye Contact:  Good  Speech:  Slow  Volume:  Decreased  Mood:  good  Affect:  Bright  Thought Process:  Goal Directed  Orientation:  Full (Time, Place, and Person)  Thought Content:  Rumination  Suicidal Thoughts:  No  Homicidal Thoughts:  No  Memory:  Immediate;   Poor Recent;   Fair Remote;   Fair  Judgement:  Impaired  Insight:  Lacking  Psychomotor Activity:  Normal   Concentration:  Fair  Recall:  Poor  Fund of Knowledge:Good  Language: Good  Akathisia:  No  Handed:  Right  AIMS (if indicated):    Assets:  Communication Skills Desire for Improvement Resilience Social Support Talents/Skills  ADL's:  Intact  Cognition: WNL  Sleep:     Is the patient at risk to self?  No. Has the patient been a risk to self in the past 6 months?  No. Has the patient been a risk to self within the distant past?  No. Is the patient a risk to others?  No. Has the patient been a risk to others in the past 6 months?  No. Has the patient been a risk to others within the distant past?  No.  Allergies:  No Known Allergies Current Medications: Current Outpatient Prescriptions  Medication Sig Dispense Refill  . amLODipine (NORVASC) 5 MG tablet  TAKE 1 TABLET BY MOUTH DAILY. 30 tablet 2  . aspirin EC 81 MG tablet Take 1 tablet (81 mg total) by mouth daily. 90 tablet 3  . atorvastatin (LIPITOR) 40 MG tablet Take 1 tablet (40 mg total) by mouth daily. 90 tablet 3  . carbamide peroxide (DEBROX) 6.5 % otic solution Apply every 2 weeks just before your Saturday shower 15 mL 0  . divalproex (DEPAKOTE ER) 500 MG 24 hr tablet Take 1 tablet (500 mg total) by mouth at bedtime. 90 tablet 2  . LORazepam (ATIVAN) 0.5 MG tablet Take 1 tablet (0.5 mg total) by mouth 3 (three) times daily. 90 tablet 2  . PARoxetine (PAXIL) 20 MG tablet Take 1 tablet (20 mg total) by mouth at bedtime. 30 tablet 0  . QUEtiapine (SEROQUEL) 50 MG tablet Take 1 tablet (50 mg total) by mouth at bedtime. 90 tablet 2  . tamsulosin (FLOMAX) 0.4 MG CAPS capsule Take 1 capsule (0.4 mg total) by mouth daily. 30 capsule 2  . traZODone (DESYREL) 50 MG tablet Take 1 tablet (50 mg total) by mouth at bedtime. 90 tablet 2  . venlafaxine XR (EFFEXOR-XR) 75 MG 24 hr capsule Take 1 capsule (75 mg total) by mouth daily with breakfast. 90 capsule 2   No current facility-administered medications for this visit.     Previous Psychotropic Medications: Yes   Substance Abuse History in the last 12 months:  No.  Consequences of Substance Abuse: NA  Medical Decision Making:  Established Problem, Stable/Improving (1), Review or order clinical lab tests (1), Review and summation of old records (2), Review or order medicine tests (1), Review of Medication Regimen & Side Effects (2) and Review of New Medication or Change in Dosage (2)  Treatment Plan Summary: Medication management  This patient recently went through hospital treatment for severe depression. Now he is on multiple psychiatric medications and for the most part he is doing better. I'm hoping over time we can taper off some of these as they can have additive side effects.  He will continue Depakote for mood stabilization, Paxil and  Effexor for depression and  trazodone for sleep. He will continue Seroquel 50 mg  at bedtime. He will continue lorazepam 0.5 mg 3 times a day but take the first dose late in the morning and return to see me in 3 months.     Malyiah Fellows 12/4/20179:46 AM

## 2016-06-12 MED FILL — PARoxetine HCL 20 MG TABS: 20 | 30 days supply | Qty: 30 | Fill #1

## 2016-06-12 MED FILL — traZODone HCL 50 MG TABS: 50 | 30 days supply | Qty: 30 | Fill #1

## 2016-06-13 ENCOUNTER — Ambulatory Visit (HOSPITAL_COMMUNITY): Payer: Self-pay | Admitting: Psychiatry

## 2016-06-14 ENCOUNTER — Other Ambulatory Visit: Payer: Self-pay | Admitting: *Deleted

## 2016-06-14 MED ORDER — TAMSULOSIN HCL 0.4 MG PO CAPS
0.4000 mg | ORAL_CAPSULE | Freq: Every day | ORAL | 0 refills | Status: DC
Start: 2016-06-14 — End: 2016-06-22

## 2016-06-14 MED FILL — TAMSULOSIN HCL 0.4 MG CAP: 0.4 | 30 days supply | Qty: 30 | Fill #0

## 2016-06-15 ENCOUNTER — Other Ambulatory Visit: Payer: Self-pay | Admitting: Family Medicine

## 2016-06-15 DIAGNOSIS — I10 Essential (primary) hypertension: Secondary | ICD-10-CM

## 2016-06-15 MED FILL — AMLODIPINE BESYLATE 5 MG TA: 5 | 90 days supply | Qty: 90 | Fill #0

## 2016-06-22 ENCOUNTER — Encounter: Payer: Self-pay | Admitting: Family Medicine

## 2016-06-22 ENCOUNTER — Ambulatory Visit (INDEPENDENT_AMBULATORY_CARE_PROVIDER_SITE_OTHER): Payer: 59 | Admitting: Family Medicine

## 2016-06-22 VITALS — BP 131/85 | HR 77 | Temp 97.0°F | Ht 71.0 in | Wt 219.0 lb

## 2016-06-22 DIAGNOSIS — I10 Essential (primary) hypertension: Secondary | ICD-10-CM | POA: Diagnosis not present

## 2016-06-22 DIAGNOSIS — N400 Enlarged prostate without lower urinary tract symptoms: Secondary | ICD-10-CM

## 2016-06-22 DIAGNOSIS — E785 Hyperlipidemia, unspecified: Secondary | ICD-10-CM

## 2016-06-22 MED ORDER — TAMSULOSIN HCL 0.4 MG PO CAPS: 0.4000 mg | ORAL_CAPSULE | Freq: Every day | ORAL | 3 refills | Status: DC

## 2016-06-22 NOTE — Progress Notes (Signed)
   HPI  Patient presents today here with hyperlipidemia, hypertension, and BPH.  HTN Good medication compliance, no complaints, patient states that he was confused and thought that he was supposed to be taking 10 mg of amlodipine, however he's been taking 5 without problem.  HLD Good medication compliance, states that he has room to improve on his diet. no myalgias  BPH Flomax helping, needs refill  PMH: Smoking status noted ROS: Per HPI  Objective: BP 131/85   Pulse 77   Temp 97 F (36.1 C) (Oral)   Ht '5\' 11"'$  (1.803 m)   Wt 219 lb (99.3 kg)   BMI 30.54 kg/m  Gen: NAD, alert, cooperative with exam HEENT: NCAT, EOMI, PERRL CV: RRR, good S1/S2, no murmur Resp: CTABL, no wheezes, non-labored Ext: No edema, warm Neuro: Alert and oriented, No gross deficits  Assessment and plan:  # Hyperlipidemia LDL goal of less than 70 given history of stroke Continue Lipitor 40 mg, recheck lipid panel today  # BPH Some improvement of symptoms with Flomax Refill medication  # Hypertension Reasonably well-controlled with current medications, continue 5 mg amlodipine    Orders Placed This Encounter  Procedures  . Lipid panel  . CMP14+EGFR    Meds ordered this encounter  Medications  . tamsulosin (FLOMAX) 0.4 MG CAPS capsule    Sig: Take 1 capsule (0.4 mg total) by mouth daily.    Dispense:  90 capsule    Refill:  [place], MD [facility] 06/22/2016, 10:52 AM

## 2016-06-22 NOTE — Patient Instructions (Signed)
Great to see you!  We will call with lab results and let you know if there are any additional changes to the lipitor dose. We are being more aggressive given your history of stroke.

## 2016-06-23 LAB — CMP14+EGFR
A/G RATIO: 1.5 (ref 1.2–2.2)
ALBUMIN: 4 g/dL (ref 3.6–4.8)
ALK PHOS: 90 IU/L (ref 39–117)
ALT: 19 IU/L (ref 0–44)
AST: 14 IU/L (ref 0–40)
BILIRUBIN TOTAL: 0.3 mg/dL (ref 0.0–1.2)
BUN / CREAT RATIO: 17 (ref 10–24)
BUN: 17 mg/dL (ref 8–27)
CHLORIDE: 99 mmol/L (ref 96–106)
CO2: 21 mmol/L (ref 18–29)
Calcium: 9.2 mg/dL (ref 8.6–10.2)
Creatinine, Ser: 1 mg/dL (ref 0.76–1.27)
GFR calc non Af Amer: 81 mL/min/{1.73_m2} (ref >59)
GFR, EST AFRICAN AMERICAN: 93 mL/min/{1.73_m2} (ref >59)
Globulin, Total: 2.6 g/dL (ref 1.5–4.5)
Glucose: 101 mg/dL — ABNORMAL HIGH (ref 65–99)
POTASSIUM: 4.7 mmol/L (ref 3.5–5.2)
SODIUM: 138 mmol/L (ref 134–144)
TOTAL PROTEIN: 6.6 g/dL (ref 6.0–8.5)

## 2016-06-23 LAB — LIPID PANEL
Chol/HDL Ratio: 3.2 ratio units (ref 0.0–5.0)
Cholesterol, Total: 167 mg/dL (ref 100–199)
HDL: 52 mg/dL (ref >39)
LDL Calculated: 94 mg/dL (ref 0–99)
TRIGLYCERIDES: 107 mg/dL (ref 0–149)
VLDL CHOLESTEROL CAL: 21 mg/dL (ref 5–40)

## 2016-06-26 ENCOUNTER — Other Ambulatory Visit (HOSPITAL_COMMUNITY): Payer: Self-pay | Admitting: Psychiatry

## 2016-06-26 ENCOUNTER — Other Ambulatory Visit: Payer: Self-pay | Admitting: *Deleted

## 2016-06-26 MED ORDER — ATORVASTATIN CALCIUM 80 MG PO TABS: 80.0000 mg | ORAL_TABLET | Freq: Every day | ORAL | 0 refills | Status: DC

## 2016-06-26 MED FILL — VENLAFAXINE HCL ER 75 MG CA: 75 | 30 days supply | Qty: 30 | Fill #1

## 2016-06-26 MED FILL — ATORVASTATIN 80 MG TABLET: 80 | 90 days supply | Qty: 90 | Fill #0

## 2016-06-26 MED FILL — LORazepam 0.5 MG TABS: 0.5 | 30 days supply | Qty: 90 | Fill #0

## 2016-06-27 MED FILL — QUETIAPINE FUMARATE 50 MG T: 50 | 90 days supply | Qty: 90 | Fill #0

## 2016-06-27 MED FILL — DIVALPROEX SOD ER 500 MG TA: 500 | 90 days supply | Qty: 90 | Fill #0

## 2016-07-17 MED FILL — PARoxetine HCL 20 MG TABS: 20 | 30 days supply | Qty: 30 | Fill #2

## 2016-07-17 MED FILL — traZODone HCL 50 MG TABS: 50 | 30 days supply | Qty: 30 | Fill #2

## 2016-07-17 MED FILL — TAMSULOSIN HCL 0.4 MG CAP: 0.4 | 90 days supply | Qty: 90 | Fill #0

## 2016-07-23 MED FILL — VENLAFAXINE HCL ER 75 MG CA: 75 | 30 days supply | Qty: 30 | Fill #2

## 2016-07-24 MED FILL — LORazepam 0.5 MG TABS: 0.5 | 30 days supply | Qty: 90 | Fill #1

## 2016-08-13 MED FILL — traZODone HCL 50 MG TABS: 50 | 90 days supply | Qty: 90 | Fill #0

## 2016-08-23 MED FILL — LORazepam 0.5 MG TABS: 0.5 | 30 days supply | Qty: 90 | Fill #2

## 2016-08-23 MED FILL — VENLAFAXINE HCL ER 75 MG CA: 75 | 30 days supply | Qty: 30 | Fill #0

## 2016-08-23 MED FILL — PARoxetine HCL 20 MG TABS: 20 | 30 days supply | Qty: 30 | Fill #0

## 2016-08-31 ENCOUNTER — Ambulatory Visit (INDEPENDENT_AMBULATORY_CARE_PROVIDER_SITE_OTHER): Payer: 59 | Admitting: Psychiatry

## 2016-08-31 ENCOUNTER — Encounter (HOSPITAL_COMMUNITY): Payer: Self-pay | Admitting: Psychiatry

## 2016-08-31 VITALS — BP 140/100 | HR 71 | Ht 71.0 in | Wt 228.8 lb

## 2016-08-31 DIAGNOSIS — Z79899 Other long term (current) drug therapy: Secondary | ICD-10-CM

## 2016-08-31 DIAGNOSIS — F322 Major depressive disorder, single episode, severe without psychotic features: Secondary | ICD-10-CM | POA: Diagnosis not present

## 2016-08-31 DIAGNOSIS — Z87891 Personal history of nicotine dependence: Secondary | ICD-10-CM | POA: Diagnosis not present

## 2016-08-31 DIAGNOSIS — Z818 Family history of other mental and behavioral disorders: Secondary | ICD-10-CM | POA: Diagnosis not present

## 2016-08-31 MED ORDER — LORAZEPAM 0.5 MG PO TABS: 0.5000 mg | ORAL_TABLET | Freq: Three times a day (TID) | ORAL | 2 refills | Status: DC

## 2016-08-31 MED ORDER — VENLAFAXINE HCL ER 75 MG PO CP24: 75.0000 mg | ORAL_CAPSULE | Freq: Every day | ORAL | 2 refills | Status: DC

## 2016-08-31 MED ORDER — TRAZODONE HCL 50 MG PO TABS: 50.0000 mg | ORAL_TABLET | Freq: Every day | ORAL | 2 refills | Status: DC

## 2016-08-31 MED ORDER — PAROXETINE HCL 20 MG PO TABS: 20.0000 mg | ORAL_TABLET | Freq: Every day | ORAL | 2 refills | Status: DC

## 2016-08-31 MED ORDER — DIVALPROEX SODIUM ER 500 MG PO TB24: 500.0000 mg | ORAL_TABLET | Freq: Every day | ORAL | 2 refills | Status: DC

## 2016-08-31 MED ORDER — QUETIAPINE FUMARATE 50 MG PO TABS: 50.0000 mg | ORAL_TABLET | Freq: Every day | ORAL | 2 refills | Status: DC

## 2016-08-31 NOTE — Progress Notes (Signed)
Patient ID: TANOR MARTINA, male   DOB: August 29, 1955, 61 y.o.   MRN: [id number] Patient ID: EQUAN EISENZIMMER, male   DOB: 1956-05-12, 61 y.o.   MRN: [id number] Patient ID: BUTCH CRADER, male   DOB: 12/16/1955, 61 y.o.   MRN: [id number] Patient ID: JAEKWON LESEBERG, male   DOB: [birth year], 61 y.o.   MRN: [id number] Patient ID: JAMUAL BASS, male   DOB: 1956/05/25, 61 y.o.   MRN: [id number]  Psychiatric Adult Follow up  Patient Identification: Lawrence Bennett MRN:  [id number] Date of Evaluation:  08/31/2016 Ref[facility]: [facility] Chief Complaint:   Chief Complaint    Depression; Anxiety; Follow-up     Visit Diagnosis:    ICD-9-CM ICD-10-CM   1. Severe single current episode of major depressive disorder, without psychotic features [facility]) 296.23 F32.2    Diagnosis:   Patient Active Problem List   Diagnosis Date Noted  . History of stroke [Z86.73] 03/23/2016  . HLD (hyperlipidemia) [E78.5] 12/20/2015  . BPH (benign prostatic hyperplasia) [N40.0] 11/11/2015  . Elevated glucose [R73.09] 11/11/2015  . Healthcare maintenance [Z00.00] 11/11/2015  . Major depressive disorder, recurrent episode, moderate (HCC) [F33.1]   . Diverticulosis of colon without hemorrhage [K57.30] 05/02/2014  . Essential hypertension [I10] 04/15/2014  . Anxiety [F41.9] 04/15/2014  . Hearing loss [H91.90] 04/15/2014  . Dizziness [R42] 04/15/2014   History of Present Illness: This patient is a 61 year old married white male who lives with his wife and 3 children ages 61,15 and 72 in 73. 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  The patient presents with his wife today. He is very hard of hearing and it's difficult to get a good history from him but she filled in the gaps. She states that he's had depression for at least one year. He started worrying about his job being discontinued or that [name] might close about a year ago. He finally  decided to retire early in November. He also had some medical problems going on such as loss of vision and loss of hearing. In 2012 he had colon resection for diverticulosis.  Since retiring in November his mental status has declined. He started staying in bed all the time being unable to function not attending to his ADLs. He stopped eating and lost about 10 pounds. He was not interacting with anyone at all. His primary doctor had put him on Zoloft but it was not helpful. He had been on a low dose of Ativan for years as well. Finally in the end of May his wife brought him to [name] ED for evaluation and from there he was transferred to [facility] geropsychiatry unit.  While on the unit he was diagnosed with severe depression. He was started on a number of medicines including Paxil and Effexor, Seroquel trazodone and Depakote. He also had 4 treatments of ECT while in the hospital and to as an outpatient. His last one was in early June.  Since getting out of the hospital he seems to be doing somewhat better. He is getting out of bed every day and doing things around the house and yard. His family went on a trip to a [name] and he refused to go with them. States that he's had a lot of short-term memory loss since having ECT but his wife thinks his mood is much better. He's never had psychotic symptoms such as auditory or visual hallucinations  or paranoia but his thoughts were somewhat disorganized. He has never been suicidal or homicidal. He does not drink or use drugs. His wife thinks that he slowly getting better but he still not the person he used to be. He complains of dry mouth secondary to all the medications. His Depakote level has not been checked since he left the hospital  The patient returns after 3 months. He is here with his wife. He is generally doing better. He is helping his sister remodeling the home and this is keeping him busy and active. He is sleeping well but has  vivid dreams but they're not particularly negative. He still feels that all his medications have been helpful. He continues to gain weight and seems to overindulge and sweets. Interestingly though of his labs look really good including his lipid panel Elements:  Location:  Global. Quality:  Severe. Severity:  Severe. Timing:  Subchronic. Duration:  Approximately one year. Context:  Medical issues, retirement from work. Associated Signs/Symptoms: Depression Symptoms:  depressed mood, anhedonia, psychomotor retardation, feelings of worthlessness/guilt, difficulty concentrating, anxiety, panic attacks, loss of energy/fatigue, weight loss,  Anxiety Symptoms:  Excessive Worry, Panic Symptoms, Social Anxiety,   Past Medical History:  Past Medical History:  Diagnosis Date  . Anxiety   . Atherosclerosis of aorta ([place])   . Atrial contractions, premature   . Depression   . Dyslipidemia   . Hyperlipidemia   . Mild hypertension   . Urinary hesitancy     Past Surgical History:  Procedure Laterality Date  . COLON RESECTION     Family History:  Family History  Problem Relation Age of Onset  . Stroke Father   . Bipolar disorder Father   . Hypertension Sister   . Depression Sister   . COPD Mother    Social History:   Social History   Social History  . Marital status: Married    Spouse name: N/A  . Number of children: N/A  . Years of education: N/A   Social History Main Topics  . Smoking status: Former Smoker    Packs/day: 0.20    Years: 19.00    Types: Cigarettes  . Smokeless tobacco: Never Used  . Alcohol use No  . Drug use: No  . Sexual activity: Not Asked   Other Topics Concern  . None   Social History Narrative  . None   Additional Social History: Patient grew up in [place]. He has 3 sisters. He states that he had a great childhood with no history of abuse or trauma. He finished high school and has one year of college. 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 He has 3 children ages 81 and 90 and his oldest daughter attends [employer]. He is concerned about finances given that he is paying for his daughter's college.  Musculoskeletal: Strength & Muscle Tone: within normal limits Gait & Station: normal Patient leans: N/A  Psychiatric Specialty Exam: Depression         Past medical history includes anxiety.   Anxiety  Symptoms include nervous/anxious behavior.      Review of Systems  Constitutional: Positive for weight loss.  HENT: Positive for hearing loss.   Eyes: Positive for blurred vision.  Psychiatric/Behavioral: Positive for depression and memory loss. The patient is nervous/anxious.     Blood pressure (!) 140/100, pulse 71, height 5\' 11"  (1.803 m), weight 228 lb 12.8 oz (103.8 kg), SpO2 93 %.Body mass index  is 31.91 kg/m.  General Appearance: Casual, Neat and Well Groomed  Eye Contact:  Good  Speech:  Slow  Volume:  Decreased  Mood:  good  Affect:  Bright  Thought Process:  Goal Directed  Orientation:  Full (Time, Place, and Person)  Thought Content:  Rumination  Suicidal Thoughts:  No  Homicidal Thoughts:  No  Memory:  Immediate;   Poor Recent;   Fair Remote;   Fair  Judgement:  Impaired  Insight:  Lacking  Psychomotor Activity:  Normal   Concentration:  Fair  Recall:  Poor  Fund of Knowledge:Good  Language: Good  Akathisia:  No  Handed:  Right  AIMS (if indicated):    Assets:  Communication Skills Desire for Improvement Resilience Social Support Talents/Skills  ADL's:  Intact  Cognition: WNL  Sleep:     Is the patient at risk to self?  No. Has the patient been a risk to self in the past 6 months?  No. Has the patient been a risk to self within the distant past?  No. Is the patient a risk to others?  No. Has the patient been a risk to others in the past 6 months?  No. Has the patient been a risk to others within the distant past?  No.  Allergies:  No Known  Allergies Current Medications: Current Outpatient Prescriptions  Medication Sig Dispense Refill  . amLODipine (NORVASC) 5 MG tablet TAKE 1 TABLET BY MOUTH DAILY. 30 tablet 2  . aspirin EC 81 MG tablet Take 1 tablet (81 mg total) by mouth daily. 90 tablet 3  . atorvastatin (LIPITOR) 80 MG tablet Take 1 tablet (80 mg total) by mouth daily. 180 tablet 0  . carbamide peroxide (DEBROX) 6.5 % otic solution Apply every 2 weeks just before your Saturday shower 15 mL 0  . divalproex (DEPAKOTE ER) 500 MG 24 hr tablet Take 1 tablet (500 mg total) by mouth at bedtime. 90 tablet 2  . LORazepam (ATIVAN) 0.5 MG tablet Take 1 tablet (0.5 mg total) by mouth 3 (three) times daily. 90 tablet 2  . PARoxetine (PAXIL) 20 MG tablet Take 1 tablet (20 mg total) by mouth at bedtime. 30 tablet 2  . QUEtiapine (SEROQUEL) 50 MG tablet Take 1 tablet (50 mg total) by mouth at bedtime. 90 tablet 2  . tamsulosin (FLOMAX) 0.4 MG CAPS capsule Take 1 capsule (0.4 mg total) by mouth daily. 90 capsule 3  . traZODone (DESYREL) 50 MG tablet Take 1 tablet (50 mg total) by mouth at bedtime. 90 tablet 2  . venlafaxine XR (EFFEXOR-XR) 75 MG 24 hr capsule Take 1 capsule (75 mg total) by mouth daily with breakfast. 90 capsule 2   No current facility-administered medications for this visit.     Previous Psychotropic Medications: Yes   Substance Abuse History in the last 12 months:  No.  Consequences of Substance Abuse: NA  Medical Decision Making:  Established Problem, Stable/Improving (1), Review or order clinical lab tests (1), Review and summation of old records (2), Review or order medicine tests (1), Review of Medication Regimen & Side Effects (2) and Review of New Medication or Change in Dosage (2)  Treatment Plan Summary: Medication management  This patient recently went through hospital treatment for severe depression. Now he is on multiple psychiatric medications and for the most part he is doing better. I'm hoping over  time we can taper off some of these as they can have additive side effects.  He will continue Depakote  for mood stabilization, Paxil and Effexor for depression and  trazodone for sleep. He will continue Seroquel 50 mg at bedtime. He will continue lorazepam 0.5 mg 3 times a day but take the first dose late in the morning and return to see me in 3 months.     ROSS, DEBORAH 3/2/201810:40 AM

## 2016-09-14 ENCOUNTER — Encounter: Payer: Self-pay | Admitting: Family Medicine

## 2016-09-14 ENCOUNTER — Ambulatory Visit (INDEPENDENT_AMBULATORY_CARE_PROVIDER_SITE_OTHER): Payer: 59

## 2016-09-14 ENCOUNTER — Ambulatory Visit (INDEPENDENT_AMBULATORY_CARE_PROVIDER_SITE_OTHER): Payer: 59 | Admitting: Family Medicine

## 2016-09-14 VITALS — BP 151/96 | HR 77 | Temp 97.5°F | Ht 71.0 in | Wt 230.0 lb

## 2016-09-14 DIAGNOSIS — J329 Chronic sinusitis, unspecified: Secondary | ICD-10-CM

## 2016-09-14 DIAGNOSIS — J40 Bronchitis, not specified as acute or chronic: Secondary | ICD-10-CM

## 2016-09-14 LAB — VERITOR FLU A/B WAIVED
INFLUENZA A: NEGATIVE
Influenza B: NEGATIVE

## 2016-09-14 MED ORDER — PSEUDOEPHEDRINE-GUAIFENESIN ER 120-1200 MG PO TB12: 1.0000 | ORAL_TABLET | Freq: Two times a day (BID) | ORAL | 0 refills | Status: DC

## 2016-09-14 MED ORDER — CEFUROXIME AXETIL 250 MG PO TABS
250.0000 mg | ORAL_TABLET | Freq: Two times a day (BID) | ORAL | 0 refills | Status: AC
Start: 2016-09-14 — End: 2016-09-24

## 2016-09-14 MED FILL — CEFUROXIME AXETIL 250 MG TA: 250 | 10 days supply | Qty: 20 | Fill #0

## 2016-09-14 NOTE — Progress Notes (Signed)
Subjective:  Patient ID: Lawrence Bennett, male    DOB: [birth year]  Age: 61 y.o. MRN: [id number]  CC: Sinusitis (pt here today c/o sneezing, coughing, nasal drainage and sore throat)   HPI LINK BURGESON presents for Patient presents with upper respiratory congestion. Rhinorrhea that is frequently purulent. Some blood in it over the last 2 days There is moderate sore throat. Patient reports coughing frequently as well.  Blood-tinged sputum noted over the last 2 days. There is no fever, chills or sweats. The patient reports being slightly short of breath. Onset was 2 weeks ago. Gradually worsening.   History Corin has a past medical history of Anxiety; Atherosclerosis of aorta ([place]); Atrial contractions, premature; Depression; Dyslipidemia; Hyperlipidemia; Mild hypertension; and Urinary hesitancy.   He has a past surgical history that includes Colon resection.   His family history includes Bipolar disorder in his father; COPD in his mother; Depression in his sister; Hypertension in his sister; Stroke in his father.He reports that he has quit smoking. His smoking use included Cigarettes. He has a 3.80 pack-year smoking history. He has never used smokeless tobacco. He reports that he does not drink alcohol or use drugs.    ROS Review of Systems  Constitutional: Negative for activity change, appetite change, chills and fever.  HENT: Positive for congestion, postnasal drip, rhinorrhea and sinus pressure. Negative for ear discharge, ear pain, hearing loss, sneezing and trouble swallowing.   Respiratory: Positive for cough and shortness of breath. Negative for chest tightness.   Cardiovascular: Negative for chest pain and palpitations.  Skin: Negative for rash.    Objective:  BP (!) 151/96   Pulse 77   Temp 97.5 F (36.4 C) (Oral)   Ht 5\' 11"  (1.803 m)   Wt 230 lb (104.3 kg)   BMI 32.08 kg/m   BP Readings from Last 3 Encounters:  09/14/16 (!) 151/96  06/22/16 131/85  03/23/16  131/82    Wt Readings from Last 3 Encounters:  09/14/16 230 lb (104.3 kg)  06/22/16 219 lb (99.3 kg)  03/23/16 212 lb 12.8 oz (96.5 kg)     Physical Exam  Constitutional: He appears well-developed and well-nourished.  HENT:  Head: Normocephalic and atraumatic.  Right Ear: Tympanic membrane and external ear normal. No decreased hearing is noted.  Left Ear: Tympanic membrane and external ear normal. No decreased hearing is noted.  Nose: Mucosal edema present. Right sinus exhibits no frontal sinus tenderness. Left sinus exhibits no frontal sinus tenderness.  Mouth/Throat: No oropharyngeal exudate or posterior oropharyngeal erythema.  Eyes: Pupils are equal, round, and reactive to light.  Neck: Normal range of motion. Neck supple. No Brudzinski's sign noted.  Cardiovascular: Normal rate and regular rhythm.   No murmur heard. Pulmonary/Chest: Breath sounds normal. No respiratory distress.  Abdominal: Soft. Bowel sounds are normal. He exhibits no mass. There is no tenderness.  Lymphadenopathy:       Head (right side): No preauricular adenopathy present.       Head (left side): No preauricular adenopathy present.       Right cervical: No superficial cervical adenopathy present.      Left cervical: No superficial cervical adenopathy present.  Vitals reviewed.     Assessment & Plan:   Denzel was seen today for sinusitis.  Diagnoses and all orders for this visit:  Sinobronchitis -     Veritor Flu A/B Waived -     DG Chest 2 View; Future  Other orders -  cefUROXime (CEFTIN) 250 MG tablet; Take 1 tablet (250 mg total) by mouth 2 (two) times daily with a meal. -     Pseudoephedrine-Guaifenesin [phone] MG TB12; Take 1 tablet by mouth 2 (two) times daily. For congestion    Influenza A/B: Negative Chest x-ray: Bronchitic changes without infiltrate.   I am having Mr. Duzan start on cefUROXime and Pseudoephedrine-Guaifenesin. I am also having him maintain his carbamide  peroxide, aspirin EC, amLODipine, tamsulosin, atorvastatin, divalproex, PARoxetine, QUEtiapine, traZODone, venlafaxine XR, and LORazepam.  Allergies as of 09/14/2016   No Known Allergies     Medication List       Accurate as of 09/14/16 11:06 AM. Always use your most recent med list.          amLODipine 5 MG tablet Commonly known as:  NORVASC TAKE 1 TABLET BY MOUTH DAILY.   aspirin EC 81 MG tablet Take 1 tablet (81 mg total) by mouth daily.   atorvastatin 80 MG tablet Commonly known as:  LIPITOR Take 1 tablet (80 mg total) by mouth daily.   carbamide peroxide 6.5 % otic solution Commonly known as:  DEBROX Apply every 2 weeks just before your Saturday shower   cefUROXime 250 MG tablet Commonly known as:  CEFTIN Take 1 tablet (250 mg total) by mouth 2 (two) times daily with a meal.   divalproex 500 MG 24 hr tablet Commonly known as:  DEPAKOTE ER Take 1 tablet (500 mg total) by mouth at bedtime.   LORazepam 0.5 MG tablet Commonly known as:  ATIVAN Take 1 tablet (0.5 mg total) by mouth 3 (three) times daily.   PARoxetine 20 MG tablet Commonly known as:  PAXIL Take 1 tablet (20 mg total) by mouth at bedtime.   Pseudoephedrine-Guaifenesin [phone] MG Tb12 Take 1 tablet by mouth 2 (two) times daily. For congestion   QUEtiapine 50 MG tablet Commonly known as:  SEROQUEL Take 1 tablet (50 mg total) by mouth at bedtime.   tamsulosin 0.4 MG Caps capsule Commonly known as:  FLOMAX Take 1 capsule (0.4 mg total) by mouth daily.   traZODone 50 MG tablet Commonly known as:  DESYREL Take 1 tablet (50 mg total) by mouth at bedtime.   venlafaxine XR 75 MG 24 hr capsule Commonly known as:  EFFEXOR-XR Take 1 capsule (75 mg total) by mouth daily with breakfast.        Follow-up: No Follow-up on file.  Claretta Fraise, M.D.

## 2016-09-20 ENCOUNTER — Other Ambulatory Visit: Payer: Self-pay | Admitting: Family Medicine

## 2016-09-20 DIAGNOSIS — I10 Essential (primary) hypertension: Secondary | ICD-10-CM

## 2016-09-20 MED FILL — LORazepam 0.5 MG TABS: 0.5 | 30 days supply | Qty: 90 | Fill #0

## 2016-09-20 MED FILL — DIVALPROEX SOD ER 500 MG TA: 500 | 90 days supply | Qty: 90 | Fill #1

## 2016-09-20 MED FILL — PARoxetine HCL 20 MG TABS: 20 | 30 days supply | Qty: 30 | Fill #0

## 2016-09-20 MED FILL — AMLODIPINE BESYLATE 5 MG TA: 5 | 90 days supply | Qty: 90 | Fill #0

## 2016-09-20 MED FILL — QUETIAPINE FUMARATE 50 MG T: 50 | 90 days supply | Qty: 90 | Fill #1

## 2016-09-20 MED FILL — VENLAFAXINE HCL ER 75 MG CA: 75 | 90 days supply | Qty: 90 | Fill #0

## 2016-09-21 ENCOUNTER — Encounter: Payer: Self-pay | Admitting: Family Medicine

## 2016-09-21 ENCOUNTER — Ambulatory Visit (INDEPENDENT_AMBULATORY_CARE_PROVIDER_SITE_OTHER): Payer: 59 | Admitting: Family Medicine

## 2016-09-21 VITALS — BP 154/102 | HR 72 | Temp 97.1°F | Ht 71.0 in | Wt 228.6 lb

## 2016-09-21 DIAGNOSIS — I10 Essential (primary) hypertension: Secondary | ICD-10-CM

## 2016-09-21 DIAGNOSIS — E785 Hyperlipidemia, unspecified: Secondary | ICD-10-CM

## 2016-09-21 DIAGNOSIS — R7309 Other abnormal glucose: Secondary | ICD-10-CM

## 2016-09-21 LAB — BAYER DCA HB A1C WAIVED: HB A1C (BAYER DCA - WAIVED): 6.3 % (ref <7.0)

## 2016-09-21 MED ORDER — AMLODIPINE BESYLATE 10 MG PO TABS: 10.0000 mg | ORAL_TABLET | Freq: Every day | ORAL | 3 refills | Status: DC

## 2016-09-21 NOTE — Progress Notes (Signed)
   HPI  Patient presents today here for chronic medical condition follow-up.  Hypertension Patient taking 5 mg of amlodipine daily, systolic blood pressure is usually in the 140s No headache or chest pain.  Elevated glucose and hyperlipidemia His wife is complaining of very large portion sizes and multiple sweets had a single sitting. She states that at times he'll eat 3 pieces of cake and one sitting. He does this several times a day. Patient tolerating Lipitor 80 mg without a problem.   PMH: Smoking status noted ROS: Per HPI  Objective: BP (!) 154/102   Pulse 72   Temp 97.1 F (36.2 C) (Oral)   Ht '5\' 11"'$  (1.803 m)   Wt 228 lb 9.6 oz (103.7 kg)   BMI 31.88 kg/m  Gen: NAD, alert, cooperative with exam HEENT: NCAT, EOMI, PERRL CV: RRR, good S1/S2, no murmur Resp: CTABL, no wheezes, non-labored Ext: No edema, warm Neuro: Alert and oriented, No gross deficits  Assessment and plan:  # Hyperlipidemia LDL goal less than 70 given history of stroke Lipitor was increased to 80 mg a few months ago, repeat labs today Clinically stable  # Hypertension Increase amlodipine to 10 mg once daily, uncontrolled  # Elevated glucose Elevated fasting glucose previously, repeat A1c with dietary indiscretion recently.   Orders Placed This Encounter  Procedures  . Lipid panel  . CMP14+EGFR  . Bayer DCA Hb A1c Waived    Meds ordered this encounter  Medications  . amLODipine (NORVASC) 10 MG tablet    Sig: Take 1 tablet (10 mg total) by mouth daily.    Dispense:  90 tablet    Refill:  Coleta, MD [facility] 09/21/2016, 9:26 AM

## 2016-09-21 NOTE — Patient Instructions (Addendum)
Great to see you!  We will call with labs within 1 week.,   Stop amlodipine 5 mg, start 10 mg once daily.   Come back in 4 months unless you need [place] sooner.

## 2016-09-22 LAB — CMP14+EGFR
A/G RATIO: 1.5 (ref 1.2–2.2)
ALT: 19 IU/L (ref 0–44)
AST: 19 IU/L (ref 0–40)
Albumin: 3.8 g/dL (ref 3.6–4.8)
Alkaline Phosphatase: 105 IU/L (ref 39–117)
BUN/Creatinine Ratio: 15 (ref 10–24)
BUN: 15 mg/dL (ref 8–27)
Bilirubin Total: 0.4 mg/dL (ref 0.0–1.2)
CHLORIDE: 101 mmol/L (ref 96–106)
CO2: 21 mmol/L (ref 18–29)
Calcium: 9.2 mg/dL (ref 8.6–10.2)
Creatinine, Ser: 1.03 mg/dL (ref 0.76–1.27)
GFR calc non Af Amer: 77 mL/min/{1.73_m2} (ref >59)
GFR, EST AFRICAN AMERICAN: 90 mL/min/{1.73_m2} (ref >59)
GLOBULIN, TOTAL: 2.5 g/dL (ref 1.5–4.5)
Glucose: 98 mg/dL (ref 65–99)
POTASSIUM: 4.6 mmol/L (ref 3.5–5.2)
SODIUM: 138 mmol/L (ref 134–144)
TOTAL PROTEIN: 6.3 g/dL (ref 6.0–8.5)

## 2016-09-22 LAB — LIPID PANEL
Chol/HDL Ratio: 3.2 ratio units (ref 0.0–5.0)
Cholesterol, Total: 146 mg/dL (ref 100–199)
HDL: 45 mg/dL (ref >39)
LDL Calculated: 82 mg/dL (ref 0–99)
TRIGLYCERIDES: 95 mg/dL (ref 0–149)
VLDL Cholesterol Cal: 19 mg/dL (ref 5–40)

## 2016-10-05 MED FILL — ATORVASTATIN 80 MG TABLET: 80 | 90 days supply | Qty: 90 | Fill #1

## 2016-10-05 MED FILL — TAMSULOSIN HCL 0.4 MG CAP: 0.4 | 90 days supply | Qty: 90 | Fill #1

## 2016-10-08 ENCOUNTER — Encounter (INDEPENDENT_AMBULATORY_CARE_PROVIDER_SITE_OTHER): Payer: Self-pay

## 2016-10-08 ENCOUNTER — Encounter: Payer: Self-pay | Admitting: Pharmacist

## 2016-10-08 ENCOUNTER — Ambulatory Visit (INDEPENDENT_AMBULATORY_CARE_PROVIDER_SITE_OTHER): Payer: 59 | Admitting: Pharmacist

## 2016-10-08 VITALS — Ht 71.0 in | Wt 224.0 lb

## 2016-10-08 DIAGNOSIS — R7303 Prediabetes: Secondary | ICD-10-CM

## 2016-10-08 DIAGNOSIS — E669 Obesity, unspecified: Secondary | ICD-10-CM

## 2016-10-08 DIAGNOSIS — E66811 Obesity, class 1: Secondary | ICD-10-CM

## 2016-10-08 NOTE — Progress Notes (Signed)
Patient ID: Lawrence Bennett, male   DOB: Oct 07, 1955, 60 y.o.   MRN: [id number]  Subjective:    Lawrence Bennett is a 61 y.o. male who presents for an INITIAL evaluation of pre diabetes mellitus.  Lawrence Bennett wife was concerned about his sugar and sweet intake and asked for A1c to be checked at last visit with PCP.  A1c was found to be 6.3%.   He has no family history of DM He retired 05/2014 at which time his weight was 165.5 lbs.  Shortly after his retirement he had an episode of severe depression.  He has been taking several meds for depression which can also increase weight and incidence of DM.  He states that his psychiatrist has been warning him to limit his calories and sweets.    Cardiovascular risk factors: advanced age (older than 25 for men, 21 for women), dyslipidemia, hypertension, male gender, obesity (BMI >= 30 kg/m2) and sedentary lifestyle  Eye exam current (within one year): no Weight trend: has increased about 56lbs over last 2.5 years but since he found out he had prediabetes he has lost about 5# Prior visit with CDE: no Current diet: in general, an "unhealthy" diet Current exercise: none Medication Compliance?  Yes  Current monitoring regimen: none    Objective:    Ht 5\' 11"  (1.803 m)   Wt 224 lb (101.6 kg)   BMI 31.24 kg/m    A1c = 6.3% (09/21/2016 FBG = 98 *09/21/2016)  Lab Review Glucose (mg/dL)  Date Value  09/21/2016 98  06/22/2016 101 (H)  03/23/2016 100 (H)   Glucose, Bld (mg/dL)  Date Value  11/10/2014 122 (H)  11/07/2014 167 (H)  11/02/2014 94   CO2 (mmol/L)  Date Value  09/21/2016 21  06/22/2016 21  03/23/2016 21   BUN (mg/dL)  Date Value  09/21/2016 15  06/22/2016 17  03/23/2016 16   Creat (mg/dL)  Date Value  04/30/2014 0.81   Creatinine, Ser (mg/dL)  Date Value  09/21/2016 1.03  06/22/2016 1.00  03/23/2016 1.06    Assessment:    Newly diagnosed pre diabetes , taking meds that can increase weight and BG Obesity -  patient has lost 5# over the last 2 weeks.   Plan:    1.  Rx changes: none 2.  Education: Discussed prediabetes and risk of developing type 2 DM.  Discussed the effects of his weight and medications on increased risk of DM 3. Recommended weight loss of 10% to start = goal weight of 207# 4. CHO counting diet discussed.  Reviewed CHO amount in various foods and how to read nutrition labels.  Discussed recommended serving sizes.  5.  Recommended increase physical activity - goal is at least 150 minutes per week 6. Follow up: 3 months  with PCP

## 2016-10-08 NOTE — Patient Instructions (Signed)
Prediabetes Many people have heard about type 2 diabetes, but its common precursor, prediabetes, doesn't get as much attention. Prediabetes is estimated by CDC to affect 86 million Americans (this includes 51% of people 65 years and older), and an estimated 90% of people with prediabetes don't even know it. According to the CDC, 15-30% of these individuals will develop type 2 diabetes within five years. In other words, as many as 26 million people that currently have prediabetes could develop type 2 diabetes by 2020, effectively doubling the number of people with type 2 diabetes in the [place].  What is prediabetes? Prediabetes is a condition where blood sugar levels are higher than normal, but not high enough to be diagnosed as type 2 diabetes. This occurs when the body has problems in processing glucose properly, and sugar starts to build up in the bloodstream instead of fueling cells in muscles and tissues. Insulin is the hormone that tells cells to take up glucose, and in prediabetes, people typically initially develop insulin resistance (where the body's cells can't respond to insulin as well), and over time (if no actions are taken to reverse the situation) the ability to produce sufficient insulin is reduced. People with prediabetes also commonly have high blood pressure as well as abnormal blood lipids (e.g. cholesterol). These often occur prior to the rise of blood glucose levels.  What are the symptoms of prediabetes? People typically do not have symptoms of prediabetes, which is partially why up to 90% of people don't know they have it. The ADA reports that some people with prediabetes may develop symptoms of type 2 diabetes, though even many people diagnosed with type 2 diabetes show little or no symptoms initially at diagnosis.  How is prediabetes diagnosed? According to the American Diabetes Association, prediabetes can be diagnosed through one of the following tests: 1. A glycated hemoglobin  test, also known as HbA1c or simply A1c, gives an idea of the body's average blood sugar levels from the past two or three months. It is usually done with a small drop of blood from a fingerstick or as part of having blood taken in a doctor's office, hospital, or laboratory. A1c Level Diagnosis  Less than 5.7% Normal  5.7% to 6.4% Prediabetes  6.5% and higher Diabetes  2. A fasting plasma glucose (FPG) test measures a person's blood glucose level after fasting (not eating) for eight hours - this is typically done in the morning. If a test shows positive for prediabetes, a second test should be taken on a different day to confirm the diagnosis. FPG Level Diagnosis  Less than 100 mg/dl Normal  100 mg/dl to 125 mg/dl Prediabetes  126 mg/dl and higher Diabetes   Who is at risk of developing prediabetes? A well-known paper published in the Lancet in 2010 recommends screening for type 2 diabetes (which would also screen for prediabetes) every 3-5 years in all adults over the age of 45, regardless of other risk factors. Overweight and obese adults (a BMI >25 kg/m2) are also at significantly greater risk for developing prediabetes, as well as people with a family history of type 2 diabetes. According to the CDC, several other factors can have moderate influences on prediabetes risk in addition to age, weight, and family history: People with an African American, Hispanic/Latino, American Indian, Asian American, or Pacific Islander racial or ethnic background. The 2015 ADA Standards of Medical Care recommendations suggest Asian Americans with a BMI of 23 or above be screened for type 2 diabetes.    Women with a history of diabetes during pregnancy ("gestational diabetes") or have given birth to a baby weighing nine pounds or more. People who are physically active fewer than three times a week. The CDC offers a fast, online screening test for evaluating the risk for prediabetes. The ADA also offers a screening  test to assess type 2 diabetes risk. Of course, these tests do not themselves confirm a prediabetes diagnosis, but just if someone may be at higher risk of developing it.  Why do people develop prediabetes? Prediabetes develops through a combination of factors that are still being investigated. For sure, lifestyle factors (food, exercise, stress, sleep) play a role, but family history and genetics certainly do as well. It is easy to assume that prediabetes is the result of being overweight, but the relationship is not that simple. While obesity is one underlying cause of insulin resistance, many overweight individuals may never develop prediabetes or type 2 diabetes, and a minority of people with prediabetes have never been overweight. To make matters worse, it can be increasingly difficult to make healthy choices in today's toxic food environment that steers all of us to make the wrong food choices, and there are many factors that can contribute to weight gain in addition to diet.  Is a prediabetes diagnosis serious? There has been significant debate around the term 'prediabetes,' and whether it should be considered cause for alarm. On the one hand, it serves as a risk factor for type 2 diabetes and a host of other complications, including heart disease, and ultimately prediabetes implies that a degree of metabolic problems have started to occur in the body. On the other hand, it places a diagnosis on many people who may never develop type 2 diabetes. Again, according to the CDC, 15-30% of those with prediabetes will develop type 2 diabetes within five years. However, a 2012 Lancet article cites 5-10% of those with prediabetes each year will also revert back to healthy blood sugars. What's critical is not necessarily the cutoff itself, but where someone falls within the ranges listed above. The level of risk of developing type 2 diabetes is closely related to A1c or FPG at diagnosis. Those in the higher ranges  (A1c closer to 6.4%, FPG closer to 125 mg/dl) are much more likely to progress to type 2 diabetes, whereas those at lower ranges (A1c closer to 5.7%, FPG closer to 100 mg/dl) are relatively more likely to revert back to normal glucose levels or stay within the prediabetes range. Age of diagnosis and the level of insulin production still occurring at diagnosis also impact the chances of reverting to normoglycemia (normal blood sugar levels).  What can people with prediabetes do to avoid the progression from prediabetes to type 2 diabetes? The most important action people diagnosed with prediabetes can take is to focus on living a healthy lifestyle. This includes making healthy food choices, controlling portions, and increasing physical activity. Regarding weight control, research shows losing 5-7% (often about 10-20 lbs.) from your initial body weight and keeping off as much of that weight over time as possible is critical to lowering the risk of type 2 diabetes. This task is of course easier said than done, but sustained weight loss over time can be key to improving health and delaying or preventing the onset of type 2 diabetes. Several prediabetes interventions exist based on evidence from the landmark Diabetes Prevention Program (DPP) study. The DPP study reported that moderate weight loss (5-7% of body weight, or ~10-15 lbs.   for someone weighing 200 lbs.), counseling, and education on healthy eating and behavior reduced the risk of developing type 2 diabetes by 58%. Data presented at the ADA 2014 conference showed that after 15 years of follow-up of the DPP study groups, the results were still encouraging: 27% of those in the original lifestyle group had a significant reduction in type 2 diabetes progression compared to the control group. If you or someone you know has been told they have prediabetes, here are a few helpful resources: In-person diabetes prevention programs: The CDC offers a one year long  lifestyle change program through its National Diabetes Prevention Program (NDPP) at various locations throughout the [place] to help participants adopt healthy habits and prevent or delay progression to type 2 diabetes. This program is a major undertaking by the CDC to translate the findings from the DPP study into a real world setting, a significant effort indeed! Online diabetes prevention programs: The CDC has now given pending recognition status to three digital prevention programs: [facility], [facility], and [facility]. These offer the same one year long educational curriculum as the DPP study, but in an online format. Some insurance companies and employers cover these programs, and you can find more information at the links above. These digital versions are excellent options for those who live far away from NDPP locations or who prefer the anonymity and convenience of doing the program online. Metformin: The DPP study found that metformin, the safest first-line therapy for type 2 diabetes, may help delay the onset of type 2 diabetes in people with prediabetes. Participants who took the low-cost generic drug had a 31% reduced risk of developing type 2 diabetes compared to the control group (those not on metformin or intensive lifestyle intervention). Again, 15-year follow up data showed that 17% of those on metformin continued to have a significant reduction in type 2 progression. At this time, metformin (or any other medication, for that matter) is not currently FDA approved for prediabetes, and it is sometimes prescribed "off-label" by a healthcare provider. Your healthcare provider can give you more information and determine whether metformin is a good option for you.  Can prediabetes be "cured"? In the early stages of prediabetes (and type 2 diabetes), diligent attention to food choices and activity, and most importantly weight loss, can improve blood sugar numbers, effectively "reversing" the disease  and reducing the odds of developing type 2 diabetes. However, some people may have underlying factors (such as family history and genetics) that put them at a greater risk of type 2 diabetes, meaning they will always require careful attention to blood sugar levels and lifestyle choices. Returning to old habits will likely put someone back on the road to prediabetes, and eventually, type 2 diabetes   

## 2016-10-12 ENCOUNTER — Ambulatory Visit: Payer: 59 | Admitting: Family Medicine

## 2016-10-19 MED FILL — PARoxetine HCL 20 MG TABS: 20 | 30 days supply | Qty: 30 | Fill #1

## 2016-10-19 MED FILL — LORazepam 0.5 MG TABS: 0.5 | 30 days supply | Qty: 90 | Fill #1

## 2016-11-07 MED FILL — traZODone HCL 50 MG TABS: 50 | 90 days supply | Qty: 90 | Fill #1

## 2016-11-07 MED FILL — AMLODIPINE BESYLATE 10 MG T: 10 | 90 days supply | Qty: 90 | Fill #0

## 2016-11-16 MED FILL — PARoxetine HCL 20 MG TABS: 20 | 30 days supply | Qty: 30 | Fill #2

## 2016-11-16 MED FILL — LORazepam 0.5 MG TABS: 0.5 | 30 days supply | Qty: 90 | Fill #2

## 2016-11-29 ENCOUNTER — Encounter (HOSPITAL_COMMUNITY): Payer: Self-pay | Admitting: Psychiatry

## 2016-11-29 ENCOUNTER — Ambulatory Visit (INDEPENDENT_AMBULATORY_CARE_PROVIDER_SITE_OTHER): Payer: 59 | Admitting: Psychiatry

## 2016-11-29 VITALS — BP 123/85 | HR 75 | Resp 16 | Wt 213.2 lb

## 2016-11-29 DIAGNOSIS — F3160 Bipolar disorder, current episode mixed, unspecified: Secondary | ICD-10-CM | POA: Diagnosis not present

## 2016-11-29 DIAGNOSIS — Z818 Family history of other mental and behavioral disorders: Secondary | ICD-10-CM

## 2016-11-29 DIAGNOSIS — Z87891 Personal history of nicotine dependence: Secondary | ICD-10-CM

## 2016-11-29 MED ORDER — VENLAFAXINE HCL ER 75 MG PO CP24: 75.0000 mg | ORAL_CAPSULE | Freq: Every day | ORAL | 2 refills | Status: DC

## 2016-11-29 MED ORDER — DIVALPROEX SODIUM ER 500 MG PO TB24: 500.0000 mg | ORAL_TABLET | Freq: Every day | ORAL | 2 refills | Status: DC

## 2016-11-29 MED ORDER — LORAZEPAM 0.5 MG PO TABS: 0.5000 mg | ORAL_TABLET | Freq: Three times a day (TID) | ORAL | 2 refills | Status: DC

## 2016-11-29 MED ORDER — QUETIAPINE FUMARATE 50 MG PO TABS: 50.0000 mg | ORAL_TABLET | Freq: Every day | ORAL | 2 refills | Status: DC

## 2016-11-29 MED ORDER — TRAZODONE HCL 50 MG PO TABS: 50.0000 mg | ORAL_TABLET | Freq: Every day | ORAL | 2 refills | Status: DC

## 2016-11-29 MED ORDER — PAROXETINE HCL 20 MG PO TABS: 20.0000 mg | ORAL_TABLET | Freq: Every day | ORAL | 2 refills | Status: DC

## 2016-11-29 NOTE — Progress Notes (Signed)
Patient ID: LOU LOEWE, male   DOB: 15-Feb-1956, 61 y.o.   MRN: [id number] Patient ID: ABRAHIM SARGENT, male   DOB: 1956-05-29, 61 y.o.   MRN: [id number] Patient ID: WALTHER SANAGUSTIN, male   DOB: [birth year], 61 y.o.   MRN: [id number] Patient ID: RHEA THRUN, male   DOB: [birth year], 61 y.o.   MRN: [id number] Patient ID: KONRAD HOAK, male   DOB: 09-Sep-1955, 61 y.o.   MRN: [id number]  Psychiatric Adult Follow up  Patient Identification: Lawrence Bennett MRN:  [id number] Date of Evaluation:  11/29/2016 Referral Source: [facility] Chief Complaint:   Chief Complaint    Anxiety; Depression; Follow-up     Visit Diagnosis:    ICD-9-CM ICD-10-CM   1. Bipolar I disorder, most recent episode mixed ([place]) 296.60 F31.60 Valproic Acid level   Diagnosis:   Patient Active Problem List   Diagnosis Date Noted  . History of stroke [Z86.73] 03/23/2016  . HLD (hyperlipidemia) [E78.5] 12/20/2015  . BPH (benign prostatic hyperplasia) [N40.0] 11/11/2015  . Elevated glucose [R73.09] 11/11/2015  . Healthcare maintenance [Z00.00] 11/11/2015  . Major depressive disorder, recurrent episode, moderate (HCC) [F33.1]   . Diverticulosis of colon without hemorrhage [K57.30] 05/02/2014  . Essential hypertension [I10] 04/15/2014  . Anxiety [F41.9] 04/15/2014  . Hearing loss [H91.90] 04/15/2014  . Dizziness [R42] 04/15/2014   History of Present Illness: This patient is a 61 year old married white male who lives with his wife and 3 children ages 62,15 and 89 in 17. 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  The patient presents with his wife today. He is very hard of hearing and it's difficult to get a good history from him but she filled in the gaps. She states that he's had depression for at least one year. He started worrying about his job being discontinued or that [name] might close about a year ago. He finally decided to retire  early in November. He also had some medical problems going on such as loss of vision and loss of hearing. In 2012 he had colon resection for diverticulosis.  Since retiring in November his mental status has declined. He started staying in bed all the time being unable to function not attending to his ADLs. He stopped eating and lost about 10 pounds. He was not interacting with anyone at all. His primary doctor had put him on Zoloft but it was not helpful. He had been on a low dose of Ativan for years as well. Finally in the end of May his wife brought him to [name] ED for evaluation and from there he was transferred to [facility] geropsychiatry unit.  While on the unit he was diagnosed with severe depression. He was started on a number of medicines including Paxil and Effexor, Seroquel trazodone and Depakote. He also had 4 treatments of ECT while in the hospital and to as an outpatient. His last one was in early June.  Since getting out of the hospital he seems to be doing somewhat better. He is getting out of bed every day and doing things around the house and yard. His family went on a trip to a [place] and he refused to go with them. States that he's had a lot of short-term memory loss since having ECT but his wife thinks his mood is much better. He's never had psychotic symptoms such as auditory or visual hallucinations or paranoia  but his thoughts were somewhat disorganized. He has never been suicidal or homicidal. He does not drink or use drugs. His wife thinks that he slowly getting better but he still not the person he used to be. He complains of dry mouth secondary to all the medications. His Depakote level has not been checked since he left the hospital  The patient returns after 3 months. He is here alone. He is staying busy helping his [place]. He is sleeping well and his energy is good and his mood is good. He has modified his diet and has lost 15  pounds since March. All of his labs looked excellent but we need to check his Depakote level. He denies any psychotic symptoms Elements:  Location:  Global. Quality:  Severe. Severity:  Severe. Timing:  Subchronic. Duration:  Approximately one year. Context:  Medical issues, retirement from work. Associated Signs/Symptoms: Depression Symptoms:  depressed mood, anhedonia, psychomotor retardation, feelings of worthlessness/guilt, difficulty concentrating, anxiety, panic attacks, loss of energy/fatigue, weight loss,  Anxiety Symptoms:  Excessive Worry, Panic Symptoms, Social Anxiety,   Past Medical History:  Past Medical History:  Diagnosis Date  . Anxiety   . Atherosclerosis of aorta ([place])   . Atrial contractions, premature   . Depression   . Dyslipidemia   . Hyperlipidemia   . Mild hypertension   . Urinary hesitancy     Past Surgical History:  Procedure Laterality Date  . COLON RESECTION     Family History:  Family History  Problem Relation Age of Onset  . Stroke Father   . Bipolar disorder Father   . Hypertension Sister   . Depression Sister   . COPD Mother    Social History:   Social History   Social History  . Marital status: Married    Spouse name: N/A  . Number of children: N/A  . Years of education: N/A   Social History Main Topics  . Smoking status: Former Smoker    Packs/day: 0.20    Years: 19.00    Types: Cigarettes  . Smokeless tobacco: Never Used  . Alcohol use No  . Drug use: No  . Sexual activity: Not on file   Other Topics Concern  . Not on file   Social History Narrative  . No narrative on file   Additional Social History: Patient grew up in [place]. He has 3 sisters. He states that he had a great childhood with no history of abuse or trauma. He finished high school and has one year of college. 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 He has 3 children ages 62 and 109 and his oldest  daughter attends [employer]. He is concerned about finances given that he is paying for his daughter's college.  Musculoskeletal: Strength & Muscle Tone: within normal limits Gait & Station: normal Patient leans: N/A  Psychiatric Specialty Exam: Depression         Past medical history includes anxiety.   Anxiety  Symptoms include nervous/anxious behavior.      Review of Systems  Constitutional: Positive for weight loss.  HENT: Positive for hearing loss.   Eyes: Positive for blurred vision.  Psychiatric/Behavioral: Positive for depression and memory loss. The patient is nervous/anxious.     Blood pressure 123/85, pulse 75, resp. rate 16, weight 213 lb 3.2 oz (96.7 kg).Body mass index is 29.74 kg/m.  General Appearance: Casual, Neat and Well Groomed  Eye Contact:  Good  Speech:  Slow  Volume:  Decreased  Mood:  good  Affect:  Bright  Thought Process:  Goal Directed  Orientation:  Full (Time, Place, and Person)  Thought Content:  Rumination  Suicidal Thoughts:  No  Homicidal Thoughts:  No  Memory:  Immediate;   Poor Recent;   Fair Remote;   Fair  Judgement:  Impaired  Insight:  Lacking  Psychomotor Activity:  Normal   Concentration:  Fair  Recall:  Poor  Fund of Knowledge:Good  Language: Good  Akathisia:  No  Handed:  Right  AIMS (if indicated):    Assets:  Communication Skills Desire for Improvement Resilience Social Support Talents/Skills  ADL's:  Intact  Cognition: WNL  Sleep:     Is the patient at risk to self?  No. Has the patient been a risk to self in the past 6 months?  No. Has the patient been a risk to self within the distant past?  No. Is the patient a risk to others?  No. Has the patient been a risk to others in the past 6 months?  No. Has the patient been a risk to others within the distant past?  No.  Allergies:  No Known Allergies Current Medications: Current Outpatient Prescriptions  Medication Sig Dispense Refill  . amLODipine (NORVASC) 10 MG  tablet Take 1 tablet (10 mg total) by mouth daily. 90 tablet 3  . aspirin EC 81 MG tablet Take 1 tablet (81 mg total) by mouth daily. 90 tablet 3  . atorvastatin (LIPITOR) 80 MG tablet Take 1 tablet (80 mg total) by mouth daily. 180 tablet 0  . carbamide peroxide (DEBROX) 6.5 % otic solution Apply every 2 weeks just before your Saturday shower 15 mL 0  . divalproex (DEPAKOTE ER) 500 MG 24 hr tablet Take 1 tablet (500 mg total) by mouth at bedtime. 90 tablet 2  . LORazepam (ATIVAN) 0.5 MG tablet Take 1 tablet (0.5 mg total) by mouth 3 (three) times daily. 90 tablet 2  . PARoxetine (PAXIL) 20 MG tablet Take 1 tablet (20 mg total) by mouth at bedtime. 30 tablet 2  . Pseudoephedrine-Guaifenesin [phone] MG TB12 Take 1 tablet by mouth 2 (two) times daily. For congestion 20 each 0  . QUEtiapine (SEROQUEL) 50 MG tablet Take 1 tablet (50 mg total) by mouth at bedtime. 90 tablet 2  . tamsulosin (FLOMAX) 0.4 MG CAPS capsule Take 1 capsule (0.4 mg total) by mouth daily. 90 capsule 3  . traZODone (DESYREL) 50 MG tablet Take 1 tablet (50 mg total) by mouth at bedtime. 90 tablet 2  . venlafaxine XR (EFFEXOR-XR) 75 MG 24 hr capsule Take 1 capsule (75 mg total) by mouth daily with breakfast. 90 capsule 2   No current facility-administered medications for this visit.     Previous Psychotropic Medications: Yes   Substance Abuse History in the last 12 months:  No.  Consequences of Substance Abuse: NA  Medical Decision Making:  Established Problem, Stable/Improving (1), Review or order clinical lab tests (1), Review and summation of old records (2), Review or order medicine tests (1), Review of Medication Regimen & Side Effects (2) and Review of New Medication or Change in Dosage (2)  Treatment Plan Summary: Medication management  This patient recently went through hospital treatment for severe depression. Now he is on multiple psychiatric medications and for the most part he is doing better. I'm hoping over  time we can taper off some of these as they can have  additive side effects.  He will continue Depakote for mood stabilization, Paxil and Effexor for depression and  trazodone for sleep. He will continue Seroquel 50 mg at bedtime. He will continue lorazepam 0.5 mg 3 times a day but take the first dose late in the morning and return to see me in 3 months. We will check a valproic acid level today    ROSS, DEBORAH 5/31/20181:27 PM

## 2016-11-30 ENCOUNTER — Ambulatory Visit (HOSPITAL_COMMUNITY): Payer: Self-pay | Admitting: Psychiatry

## 2016-11-30 LAB — VALPROIC ACID LEVEL: VALPROIC ACID LVL: 41.8 mg/L — AB (ref 50.0–100.0)

## 2016-12-14 ENCOUNTER — Telehealth: Payer: Self-pay | Admitting: *Deleted

## 2016-12-14 ENCOUNTER — Ambulatory Visit (INDEPENDENT_AMBULATORY_CARE_PROVIDER_SITE_OTHER): Payer: 59 | Admitting: Family Medicine

## 2016-12-14 ENCOUNTER — Encounter: Payer: Self-pay | Admitting: Family Medicine

## 2016-12-14 VITALS — BP 130/86 | HR 74 | Temp 97.0°F | Ht 70.0 in | Wt 213.2 lb

## 2016-12-14 DIAGNOSIS — W57XXXA Bitten or stung by nonvenomous insect and other nonvenomous arthropods, initial encounter: Secondary | ICD-10-CM

## 2016-12-14 DIAGNOSIS — R109 Unspecified abdominal pain: Secondary | ICD-10-CM

## 2016-12-14 DIAGNOSIS — S30861A Insect bite (nonvenomous) of abdominal wall, initial encounter: Secondary | ICD-10-CM | POA: Diagnosis not present

## 2016-12-14 DIAGNOSIS — I10 Essential (primary) hypertension: Secondary | ICD-10-CM

## 2016-12-14 MED ORDER — AMLODIPINE BESYLATE 5 MG PO TABS: 5.0000 mg | ORAL_TABLET | Freq: Every day | ORAL | 3 refills | Status: DC

## 2016-12-14 MED ORDER — DOXYCYCLINE HYCLATE 100 MG PO TABS: 100.0000 mg | ORAL_TABLET | Freq: Two times a day (BID) | ORAL | 0 refills | Status: DC

## 2016-12-14 MED FILL — DOXYCYCLINE HYCLATE 100 MG: 100 | 10 days supply | Qty: 20 | Fill #0

## 2016-12-14 MED FILL — DIVALPROEX SOD ER 500 MG TA: 500 | 90 days supply | Qty: 90 | Fill #2

## 2016-12-14 MED FILL — LORazepam 0.5 MG TABS: 0.5 | 30 days supply | Qty: 90 | Fill #0

## 2016-12-14 MED FILL — PARoxetine HCL 20 MG TABS: 20 | 30 days supply | Qty: 30 | Fill #0

## 2016-12-14 NOTE — Telephone Encounter (Signed)
done

## 2016-12-14 NOTE — Progress Notes (Signed)
   HPI  Patient presents today here for follow-up hypertension, tick bite, and R flank pain  Patient describes persistent burning type pain in the right flank, this is his strip extending from about 5 cm behind the midaxillary line to 5 cm in front of the midaxillary line in a dermatomal distribution. No rash, fever, chills, arthralgias, or change in bowel habits. Not associated with eating  Patient had a tick in place for approximately 1 week, this was removed without a problem. He continued to itch so the patient tried to burn the area with a magnifying glass, he explains that he did this taking that the head may be still present. This caused a large wound. This is healing.  Blood pressure was too low on 10 mg of amlodipine after he made his diet changes for prediabetes. He's done lots of work with his diet and is happy with his weight loss.  PMH: Smoking status noted ROS: Per HPI  Objective: BP 130/86   Pulse 74   Temp 97 F (36.1 C) (Oral)   Ht '5\' 10"'$  (1.778 m)   Wt 213 lb 3.2 oz (96.7 kg)   BMI 30.59 kg/m  Gen: NAD, alert, cooperative with exam HEENT: NCAT CV: RRR, good S1/S2, no murmur Resp: CTABL, no wheezes, non-labored Abd: SNTND, BS present, no guarding or organomegaly- no rashes in the area of concern  The area of concern is on the right flank in a dermatomal distribution extending from approximately 10 cm behind the midaxillary line to 10 cm in front Ext: No edema, warm Neuro: Alert and oriented, No gross deficits Skin :  Right lateral hip with 17 x 19 mm yellow crusted lesion with surrounding erythema measuring 31 mm in diameter, he does have underlying induration, however no tenderness, unusual warmth, or drainage.  Assessment and plan:  # Tick bite With large wound after a skin tried to burn the head out using a magnifying glass Considering that the tick was present for so many days I did recommend treatment with doxycycline. Labs   # Flank pain Unclear  etiology, possibly related to the tick bite, however not definitively. Exam is reassuring, no rash Consider shingles, low threshold for follow-up if symptoms worsen or do not improve. Also very low threshold for follow-up of rash appears  # Hypertension Controlled on 5 mg amlodipine, patient has had improvement after therapy lifestyle changes for prediabetes. Prescription sent for 5 mg amlodipine, this is a change from previous 10 mg     Orders Placed This Encounter  Procedures  . [place] spotted fvr abs pnl(IgG+IgM)  . Lyme Ab/Western Blot Reflex  . CMP14+EGFR    Meds ordered this encounter  Medications  . amLODipine (NORVASC) 5 MG tablet    Sig: Take 1 tablet (5 mg total) by mouth daily.    Dispense:  90 tablet    Refill:  3  . doxycycline (VIBRA-TABS) 100 MG tablet    Sig: Take 1 tablet (100 mg total) by mouth 2 (two) times daily. 1 po bid    Dispense:  20 tablet    Refill:  0    Laroy Apple, MD [place] 12/14/2016, 11:46 AM

## 2016-12-14 NOTE — Patient Instructions (Addendum)
Great to see you!  Congratulations on the weight loss, you are down 17 lbs from a few months ago!  Keep up the good work.   The pain in your side is difficult to explain. Considering how long the tick was in place I'm recommending that you take doxycycline to be sure that there are no tickborne illnesses. I have also checked labs to check for these today. The labs are not always positive even when the tickborne illness is present, so I would recommend finishing all of the doxycycline.  I have sent a new prescription for 5 mg amlodipine to your pharmacy.  If your pain has not improved by the end of the doxycyline, please come back. If you develop a rash in that area please let me know right away ( that could be shingles)

## 2016-12-18 ENCOUNTER — Telehealth: Payer: Self-pay | Admitting: Family Medicine

## 2016-12-18 LAB — CMP14+EGFR
A/G RATIO: 1.5 (ref 1.2–2.2)
ALT: 23 IU/L (ref 0–44)
AST: 16 IU/L (ref 0–40)
Albumin: 4.1 g/dL (ref 3.6–4.8)
Alkaline Phosphatase: 94 IU/L (ref 39–117)
BILIRUBIN TOTAL: 0.5 mg/dL (ref 0.0–1.2)
BUN/Creatinine Ratio: 11 (ref 10–24)
BUN: 12 mg/dL (ref 8–27)
CALCIUM: 9.5 mg/dL (ref 8.6–10.2)
CHLORIDE: 101 mmol/L (ref 96–106)
CO2: 22 mmol/L (ref 20–29)
Creatinine, Ser: 1.1 mg/dL (ref 0.76–1.27)
GFR, EST AFRICAN AMERICAN: 83 mL/min/{1.73_m2} (ref >59)
GFR, EST NON AFRICAN AMERICAN: 72 mL/min/{1.73_m2} (ref >59)
GLUCOSE: 119 mg/dL — AB (ref 65–99)
Globulin, Total: 2.7 g/dL (ref 1.5–4.5)
POTASSIUM: 4.2 mmol/L (ref 3.5–5.2)
Sodium: 138 mmol/L (ref 134–144)
TOTAL PROTEIN: 6.8 g/dL (ref 6.0–8.5)

## 2016-12-18 LAB — LYME AB/WESTERN BLOT REFLEX

## 2016-12-18 LAB — ROCKY MTN SPOTTED FVR ABS PNL(IGG+IGM)
RMSF IgG: NEGATIVE
RMSF IgM: 0.29 index (ref 0.00–0.89)

## 2016-12-18 NOTE — Telephone Encounter (Signed)
Patient aware that this lab is waiting for lyme results to come back

## 2016-12-19 ENCOUNTER — Other Ambulatory Visit: Payer: Self-pay | Admitting: *Deleted

## 2016-12-21 MED FILL — QUETIAPINE FUMARATE 50 MG T: 50 | 90 days supply | Qty: 90 | Fill #2

## 2016-12-21 MED FILL — VENLAFAXINE HCL ER 75 MG CA: 75 | 90 days supply | Qty: 90 | Fill #1

## 2016-12-28 ENCOUNTER — Ambulatory Visit: Payer: 59 | Admitting: Family Medicine

## 2017-01-04 MED FILL — TAMSULOSIN HCL 0.4 MG CAP: 0.4 | 90 days supply | Qty: 90 | Fill #2

## 2017-01-11 ENCOUNTER — Other Ambulatory Visit: Payer: Self-pay | Admitting: Family Medicine

## 2017-01-11 MED FILL — PARoxetine HCL 20 MG TABS: 20 | 30 days supply | Qty: 30 | Fill #1

## 2017-01-11 MED FILL — ATORVASTATIN 80 MG TABLET: 80 | 90 days supply | Qty: 90 | Fill #0

## 2017-01-28 MED FILL — LORazepam 0.5 MG TABS: 0.5 | 30 days supply | Qty: 90 | Fill #1

## 2017-02-08 MED FILL — PARoxetine HCL 20 MG TABS: 20 | 30 days supply | Qty: 30 | Fill #2

## 2017-02-08 MED FILL — traZODone HCL 50 MG TABS: 50 | 90 days supply | Qty: 90 | Fill #2

## 2017-02-25 ENCOUNTER — Ambulatory Visit (HOSPITAL_COMMUNITY): Payer: Self-pay | Admitting: Psychiatry

## 2017-02-25 ENCOUNTER — Ambulatory Visit (INDEPENDENT_AMBULATORY_CARE_PROVIDER_SITE_OTHER): Payer: 59 | Admitting: Psychiatry

## 2017-02-25 ENCOUNTER — Encounter (HOSPITAL_COMMUNITY): Payer: Self-pay | Admitting: Psychiatry

## 2017-02-25 VITALS — BP 124/82 | HR 78 | Ht 70.5 in | Wt 215.0 lb

## 2017-02-25 DIAGNOSIS — Z6830 Body mass index (BMI) 30.0-30.9, adult: Secondary | ICD-10-CM

## 2017-02-25 DIAGNOSIS — F3160 Bipolar disorder, current episode mixed, unspecified: Secondary | ICD-10-CM

## 2017-02-25 DIAGNOSIS — Z818 Family history of other mental and behavioral disorders: Secondary | ICD-10-CM

## 2017-02-25 DIAGNOSIS — R413 Other amnesia: Secondary | ICD-10-CM

## 2017-02-25 DIAGNOSIS — H9190 Unspecified hearing loss, unspecified ear: Secondary | ICD-10-CM

## 2017-02-25 DIAGNOSIS — Z87891 Personal history of nicotine dependence: Secondary | ICD-10-CM | POA: Diagnosis not present

## 2017-02-25 DIAGNOSIS — R634 Abnormal weight loss: Secondary | ICD-10-CM

## 2017-02-25 DIAGNOSIS — F419 Anxiety disorder, unspecified: Secondary | ICD-10-CM | POA: Diagnosis not present

## 2017-02-25 MED ORDER — DIVALPROEX SODIUM ER 500 MG PO TB24: 500.0000 mg | ORAL_TABLET | Freq: Every day | ORAL | 2 refills | Status: DC

## 2017-02-25 MED ORDER — TRAZODONE HCL 50 MG PO TABS: 50.0000 mg | ORAL_TABLET | Freq: Every day | ORAL | 2 refills | Status: DC

## 2017-02-25 MED ORDER — QUETIAPINE FUMARATE 50 MG PO TABS: 50.0000 mg | ORAL_TABLET | Freq: Every day | ORAL | 2 refills | Status: DC

## 2017-02-25 MED ORDER — PAROXETINE HCL 20 MG PO TABS: 20.0000 mg | ORAL_TABLET | Freq: Every day | ORAL | 2 refills | Status: DC

## 2017-02-25 MED ORDER — LORAZEPAM 0.5 MG PO TABS: 0.5000 mg | ORAL_TABLET | Freq: Three times a day (TID) | ORAL | 2 refills | Status: DC

## 2017-02-25 MED ORDER — VENLAFAXINE HCL ER 75 MG PO CP24: 75.0000 mg | ORAL_CAPSULE | Freq: Every day | ORAL | 2 refills | Status: DC

## 2017-02-25 MED FILL — LORazepam 0.5 MG TABS: 0.5 | 30 days supply | Qty: 90 | Fill #2

## 2017-02-25 NOTE — Progress Notes (Signed)
Patient ID: Lawrence Bennett, male   DOB: 06-18-1956, 61 y.o.   MRN: [id number] Patient ID: Lawrence Bennett, male   DOB: [birth year], 61 y.o.   MRN: [id number] Patient ID: Lawrence Bennett, male   DOB: [birth year], 61 y.o.   MRN: [id number] Patient ID: Lawrence Bennett, male   DOB: 14-Feb-1956, 61 y.o.   MRN: [id number] Patient ID: Lawrence Bennett, male   DOB: May 17, 1956, 61 y.o.   MRN: [id number]  Psychiatric Adult Follow up  Patient Identification: Lawrence Bennett MRN:  [id number] Date of Evaluation:  02/25/2017 Referral Source: [facility] Chief Complaint:   Chief Complaint    Follow-up; Depression; Anxiety     Visit Diagnosis:    ICD-10-CM   1. Bipolar I disorder, most recent episode mixed ([place]) F31.60    Diagnosis:   Patient Active Problem List   Diagnosis Date Noted  . History of stroke [Z86.73] 03/23/2016  . HLD (hyperlipidemia) [E78.5] 12/20/2015  . BPH (benign prostatic hyperplasia) [N40.0] 11/11/2015  . Elevated glucose [R73.09] 11/11/2015  . Healthcare maintenance [Z00.00] 11/11/2015  . Major depressive disorder, recurrent episode, moderate (HCC) [F33.1]   . Diverticulosis of colon without hemorrhage [K57.30] 05/02/2014  . Essential hypertension [I10] 04/15/2014  . Anxiety [F41.9] 04/15/2014  . Hearing loss [H91.90] 04/15/2014  . Dizziness [R42] 04/15/2014   History of Present Illness: This patient is a 61 year old married white male who lives with his wife and 3 children ages 60,15 and 69 in 73. 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  The patient presents with his wife today. He is very hard of hearing and it's difficult to get a good history from him but she filled in the gaps. She states that he's had depression for at least one year. He started worrying about his job being discontinued or that [name] might close about a year ago. He finally decided to retire early in November. He also had some  medical problems going on such as loss of vision and loss of hearing. In 2012 he had colon resection for diverticulosis.  Since retiring in November his mental status has declined. He started staying in bed all the time being unable to function not attending to his ADLs. He stopped eating and lost about 10 pounds. He was not interacting with anyone at all. His primary doctor had put him on Zoloft but it was not helpful. He had been on a low dose of Ativan for years as well. Finally in the end of May his wife brought him to [name] ED for evaluation and from there he was transferred to [facility] geropsychiatry unit.  While on the unit he was diagnosed with severe depression. He was started on a number of medicines including Paxil and Effexor, Seroquel trazodone and Depakote. He also had 4 treatments of ECT while in the hospital and to as an outpatient. His last one was in early June.  Since getting out of the hospital he seems to be doing somewhat better. He is getting out of bed every day and doing things around the house and yard. His family went on a trip to a [place] and he refused to go with them. States that he's had a lot of short-term memory loss since having ECT but his wife thinks his mood is much better. He's never had psychotic symptoms such as auditory or visual hallucinations or paranoia but his thoughts were  somewhat disorganized. He has never been suicidal or homicidal. He does not drink or use drugs. His wife thinks that he slowly getting better but he still not the person he used to be. He complains of dry mouth secondary to all the medications. His Depakote level has not been checked since he left the hospital  The patient returns after 3 months. He is here with his wife. He has had a good summer. He has maintained his weight loss. His chemistry panel looks good. He recently had a tick bite but his Lyme disease and [facility] spotted fever tests are negative. His  mood is been good and he is sleeping well and he denies any grogginess. On one hand he wants to get off some of his psychiatric medications but he he has done really well on the combination side didn't think this is a good idea and he agrees Elements:  Location:  Global. Quality:  Severe. Severity:  Severe. Timing:  Subchronic. Duration:  Approximately one year. Context:  Medical issues, retirement from work. Associated Signs/Symptoms: Depression Symptoms:  depressed mood, anhedonia, psychomotor retardation, feelings of worthlessness/guilt, difficulty concentrating, anxiety, panic attacks, loss of energy/fatigue, weight loss,  Anxiety Symptoms:  Excessive Worry, Panic Symptoms, Social Anxiety,   Past Medical History:  Past Medical History:  Diagnosis Date  . Anxiety   . Atherosclerosis of aorta ([place])   . Atrial contractions, premature   . Depression   . Dyslipidemia   . Hyperlipidemia   . Mild hypertension   . Urinary hesitancy     Past Surgical History:  Procedure Laterality Date  . COLON RESECTION     Family History:  Family History  Problem Relation Age of Onset  . Stroke Father   . Bipolar disorder Father   . Hypertension Sister   . Depression Sister   . COPD Mother    Social History:   Social History   Social History  . Marital status: Married    Spouse name: N/A  . Number of children: N/A  . Years of education: N/A   Social History Main Topics  . Smoking status: Former Smoker    Packs/day: 0.20    Years: 19.00    Types: Cigarettes  . Smokeless tobacco: Never Used  . Alcohol use No  . Drug use: No  . Sexual activity: Not Currently   Other Topics Concern  . None   Social History Narrative  . None   Additional Social History: Patient grew up in [place]. He has 3 sisters. He states that he had a great childhood with no history of abuse or trauma. He finished high school and has one year of college. 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 He has 3 children ages 15 and 42 and his oldest daughter attends [employer]. He is concerned about finances given that he is paying for his daughter's college.  Musculoskeletal: Strength & Muscle Tone: within normal limits Gait & Station: normal Patient leans: N/A  Psychiatric Specialty Exam: Anxiety  Symptoms include nervous/anxious behavior.    Depression         Past medical history includes anxiety.     Review of Systems  Constitutional: Positive for weight loss.  HENT: Positive for hearing loss.   Eyes: Positive for blurred vision.  Psychiatric/Behavioral: Positive for depression and memory loss. The patient is nervous/anxious.     Blood pressure 124/82, pulse 78, height 5' 10.5" (1.791 m), weight 215  lb (97.5 kg).Body mass index is 30.41 kg/m.  General Appearance: Casual, Neat and Well Groomed  Eye Contact:  Good  Speech:  Slow  Volume:  Decreased  Mood:  good  Affect:  Bright  Thought Process:  Goal Directed  Orientation:  Full (Time, Place, and Person)  Thought Content:  Rumination  Suicidal Thoughts:  No  Homicidal Thoughts:  No  Memory:  Immediate;   Poor Recent;   Fair Remote;   Fair  Judgement:  Impaired  Insight:  Lacking  Psychomotor Activity:  Normal   Concentration:  Fair  Recall:  Poor  Fund of Knowledge:Good  Language: Good  Akathisia:  No  Handed:  Right  AIMS (if indicated):    Assets:  Communication Skills Desire for Improvement Resilience Social Support Talents/Skills  ADL's:  Intact  Cognition: WNL  Sleep:     Is the patient at risk to self?  No. Has the patient been a risk to self in the past 6 months?  No. Has the patient been a risk to self within the distant past?  No. Is the patient a risk to others?  No. Has the patient been a risk to others in the past 6 months?  No. Has the patient been a risk to others within the distant past?  No.  Allergies:  No Known Allergies Current  Medications: Current Outpatient Prescriptions  Medication Sig Dispense Refill  . amLODipine (NORVASC) 5 MG tablet Take 1 tablet (5 mg total) by mouth daily. 90 tablet 3  . aspirin EC 81 MG tablet Take 1 tablet (81 mg total) by mouth daily. 90 tablet 3  . atorvastatin (LIPITOR) 80 MG tablet TAKE 1 TABLET BY MOUTH DAILY. 180 tablet 0  . carbamide peroxide (DEBROX) 6.5 % otic solution Apply every 2 weeks just before your Saturday shower 15 mL 0  . divalproex (DEPAKOTE ER) 500 MG 24 hr tablet Take 1 tablet (500 mg total) by mouth at bedtime. 90 tablet 2  . doxycycline (VIBRA-TABS) 100 MG tablet Take 1 tablet (100 mg total) by mouth 2 (two) times daily. 1 po bid 20 tablet 0  . LORazepam (ATIVAN) 0.5 MG tablet Take 1 tablet (0.5 mg total) by mouth 3 (three) times daily. 90 tablet 2  . PARoxetine (PAXIL) 20 MG tablet Take 1 tablet (20 mg total) by mouth at bedtime. 30 tablet 2  . Pseudoephedrine-Guaifenesin [id number] MG TB12 Take 1 tablet by mouth 2 (two) times daily. For congestion 20 each 0  . QUEtiapine (SEROQUEL) 50 MG tablet Take 1 tablet (50 mg total) by mouth at bedtime. 90 tablet 2  . tamsulosin (FLOMAX) 0.4 MG CAPS capsule Take 1 capsule (0.4 mg total) by mouth daily. 90 capsule 3  . traZODone (DESYREL) 50 MG tablet Take 1 tablet (50 mg total) by mouth at bedtime. 90 tablet 2  . venlafaxine XR (EFFEXOR-XR) 75 MG 24 hr capsule Take 1 capsule (75 mg total) by mouth daily with breakfast. 90 capsule 2   No current facility-administered medications for this visit.     Previous Psychotropic Medications: Yes   Substance Abuse History in the last 12 months:  No.  Consequences of Substance Abuse: NA  Medical Decision Making:  Established Problem, Stable/Improving (1), Review or order clinical lab tests (1), Review and summation of old records (2), Review or order medicine tests (1), Review of Medication Regimen & Side Effects (2) and Review of New Medication or Change in Dosage (2)  Treatment  Plan Summary: Medication management  This patient recently went through hospital treatment for severe depression. Now he is on multiple psychiatric medications and for the most part he is doing better.  He will continue Depakote for mood stabilization, Paxil and Effexor for depression and  trazodone for sleep. He will continue Seroquel 50 mg at bedtime. He will continue lorazepam 0.5 mg 3 times a day but take the first dose late in the morning and return to see me in 3 months last Depakote level was okay at 44.    Charli Liberatore, [facility] 8/27/201811:27 AM

## 2017-02-28 ENCOUNTER — Ambulatory Visit (HOSPITAL_COMMUNITY): Payer: Self-pay | Admitting: Psychiatry

## 2017-03-13 ENCOUNTER — Other Ambulatory Visit (HOSPITAL_COMMUNITY): Payer: Self-pay | Admitting: Psychiatry

## 2017-03-13 MED FILL — DIVALPROEX SOD ER 500 MG TA: 500 | 30 days supply | Qty: 30 | Fill #0

## 2017-03-14 ENCOUNTER — Other Ambulatory Visit (HOSPITAL_COMMUNITY): Payer: Self-pay | Admitting: Psychiatry

## 2017-03-15 ENCOUNTER — Other Ambulatory Visit (HOSPITAL_COMMUNITY): Payer: Self-pay | Admitting: Psychiatry

## 2017-03-22 ENCOUNTER — Telehealth (HOSPITAL_COMMUNITY): Payer: Self-pay

## 2017-03-22 ENCOUNTER — Other Ambulatory Visit (HOSPITAL_COMMUNITY): Payer: Self-pay | Admitting: Psychiatry

## 2017-03-22 MED ORDER — PAROXETINE HCL 20 MG PO TABS: 20.0000 mg | ORAL_TABLET | Freq: Every day | ORAL | 2 refills | Status: DC

## 2017-03-22 MED FILL — VENLAFAXINE HCL ER 75 MG CA: 75 | 90 days supply | Qty: 90 | Fill #2

## 2017-03-22 MED FILL — QUETIAPINE FUMARATE 50 MG T: 50 | 90 days supply | Qty: 90 | Fill #0

## 2017-03-22 MED FILL — PARoxetine HCL 20 MG TABS: 20 | 30 days supply | Qty: 30 | Fill #0

## 2017-03-22 NOTE — Telephone Encounter (Signed)
sent 

## 2017-03-22 NOTE — Telephone Encounter (Signed)
;  pt needs rx for paxil 20mg  sent to [facility]. pt states that rx was [facility] to [facility] in [place] and they are closed now. so new pharmacy is the [facility]. pt was last seen on  02-28-17 next appt  05-27-17  Order Providers   Prescribing Provider Encounter Provider  Cloria Spring, MD Cloria Spring, MD  Medication Detail    Disp Refills Start End   PARoxetine (PAXIL) 20 MG tablet 30 tablet 2 02/25/2017    Sig - Route: Take 1 tablet (20 mg total) by mouth at bedtime. - Oral   Sent to pharmacy as: PARoxetine (PAXIL) 20 MG tablet   E-Prescribing Status: Receipt confirmed by pharmacy (02/25/2017 11:27 AM EDT)   Pharmacy   [facility], [place]

## 2017-03-26 ENCOUNTER — Encounter: Payer: Self-pay | Admitting: Family Medicine

## 2017-03-26 ENCOUNTER — Ambulatory Visit (INDEPENDENT_AMBULATORY_CARE_PROVIDER_SITE_OTHER): Payer: 59 | Admitting: Family Medicine

## 2017-03-26 VITALS — BP 134/92 | HR 79 | Temp 98.3°F | Ht 70.5 in | Wt 218.8 lb

## 2017-03-26 DIAGNOSIS — H53413 Scotoma involving central area, bilateral: Secondary | ICD-10-CM | POA: Diagnosis not present

## 2017-03-26 DIAGNOSIS — E785 Hyperlipidemia, unspecified: Secondary | ICD-10-CM | POA: Diagnosis not present

## 2017-03-26 DIAGNOSIS — Z0000 Encounter for general adult medical examination without abnormal findings: Secondary | ICD-10-CM

## 2017-03-26 DIAGNOSIS — I10 Essential (primary) hypertension: Secondary | ICD-10-CM

## 2017-03-26 DIAGNOSIS — Z23 Encounter for immunization: Secondary | ICD-10-CM | POA: Diagnosis not present

## 2017-03-26 DIAGNOSIS — R7303 Prediabetes: Secondary | ICD-10-CM

## 2017-03-26 LAB — BAYER DCA HB A1C WAIVED: HB A1C: 5.6 % (ref <7.0)

## 2017-03-26 MED ORDER — AMLODIPINE BESYLATE 5 MG PO TABS
5.0000 mg | ORAL_TABLET | Freq: Every day | ORAL | 3 refills | Status: DC
Start: 2017-03-26 — End: 2018-03-24

## 2017-03-26 MED FILL — AMLODIPINE BESYLATE 5 MG TA: 5 | 90 days supply | Qty: 90 | Fill #0

## 2017-03-26 NOTE — Patient Instructions (Signed)
Great to see you!  Come back in 4 months unless you need [place] sooner.   Your Blood sugar looks great, keep up the good work!  I would recommend seeing an eye specialist within 2-4 weeks

## 2017-03-26 NOTE — Progress Notes (Signed)
   HPI  Patient presents today here to follow-up for chronic medical conditions.  Prediabetes Not really watching diet, he is active but no formal exercise routine. No medications.  Hypertension Good medication compliance, no headaches Blood pressure is average 130s over 80s at home.  Hyperlipidemia Good medication compliance. Fasting today  , Have been going on worse for 2 or 3 months, now worse over the last month, approximately 10 in each eye. Patient states that he may have a history of macular degeneration, he also notes difficulty focusing to read likely.   PMH: Smoking status noted ROS: Per HPI  Objective: BP (!) 134/92   Pulse 79   Temp 98.3 F (36.8 C) (Oral)   Ht 5' 10.5" (1.791 m)   Wt 218 lb 12.8 oz (99.2 kg)   BMI 30.95 kg/m  Gen: NAD, alert, cooperative with exam HEENT: NCAT CV: RRR, good S1/S2, no murmur Resp: CTABL, no wheezes, non-labored Ext: No edema, warm Neuro: Alert and oriented, No gross deficits  Diabetic Foot Exam - Simple   No data filed       Assessment and plan:  # Hypertension Slightly elevated today in the diastolic, however controlled at home Continue current medications. Labs  # Prediabetes Congratulated on improvement, A1c 5.6.   # Hyperlipidemia Repeat labs, history of CVA LDL goal less than 70 Clinically stable  # Scotoma Multiple scotoma that are new over the last month or so Recommended seeing his ophthalmologist as soon as he can get worked in, however not emergent. He denies any sudden visual changes, monocular symptoms, or fluctuating symptoms. I discussed the red flags and reasons to be concern for retinal detachment, which I think is unlikely at this time.    Orders Placed This Encounter  Procedures  . Bayer DCA Hb A1c Waived  . CMP14+EGFR  . CBC with Differential/Platelet  . Lipid panel    Meds ordered this encounter  Medications  . amLODipine (NORVASC) 5 MG tablet    Sig: Take 1 tablet (5 mg  total) by mouth daily.    Dispense:  90 tablet    Refill:  [employer], MD [facility] 03/26/2017, 11:12 AM

## 2017-03-26 NOTE — Addendum Note (Signed)
Addended by: Nigel Berthold C on: 03/26/2017 11:26 AM   Modules accepted: Orders

## 2017-03-27 ENCOUNTER — Encounter: Payer: Self-pay | Admitting: Family Medicine

## 2017-03-27 LAB — CBC WITH DIFFERENTIAL/PLATELET
BASOS: 1 %
Basophils Absolute: 0.1 10*3/uL (ref 0.0–0.2)
EOS (ABSOLUTE): 0.2 10*3/uL (ref 0.0–0.4)
EOS: 3 %
HEMATOCRIT: 39.6 % (ref 37.5–51.0)
Hemoglobin: 13.3 g/dL (ref 13.0–17.7)
Immature Grans (Abs): 0 10*3/uL (ref 0.0–0.1)
Immature Granulocytes: 0 %
LYMPHS ABS: 2.3 10*3/uL (ref 0.7–3.1)
Lymphs: 38 %
MCH: 28.9 pg (ref 26.6–33.0)
MCHC: 33.6 g/dL (ref 31.5–35.7)
MCV: 86 fL (ref 79–97)
MONOS ABS: 0.5 10*3/uL (ref 0.1–0.9)
Monocytes: 9 %
Neutrophils Absolute: 2.9 10*3/uL (ref 1.4–7.0)
Neutrophils: 49 %
Platelets: 247 10*3/uL (ref 150–379)
RBC: 4.6 x10E6/uL (ref 4.14–5.80)
RDW: 15 % (ref 12.3–15.4)
WBC: 5.9 10*3/uL (ref 3.4–10.8)

## 2017-03-27 LAB — CMP14+EGFR
A/G RATIO: 1.7 (ref 1.2–2.2)
ALBUMIN: 3.9 g/dL (ref 3.6–4.8)
ALK PHOS: 84 IU/L (ref 39–117)
ALT: 17 IU/L (ref 0–44)
AST: 19 IU/L (ref 0–40)
BUN / CREAT RATIO: 11 (ref 10–24)
BUN: 11 mg/dL (ref 8–27)
Bilirubin Total: 0.3 mg/dL (ref 0.0–1.2)
CALCIUM: 9.1 mg/dL (ref 8.6–10.2)
CO2: 23 mmol/L (ref 20–29)
CREATININE: 0.98 mg/dL (ref 0.76–1.27)
Chloride: 103 mmol/L (ref 96–106)
GFR calc Af Amer: 95 mL/min/{1.73_m2} (ref >59)
GFR, EST NON AFRICAN AMERICAN: 82 mL/min/{1.73_m2} (ref >59)
GLOBULIN, TOTAL: 2.3 g/dL (ref 1.5–4.5)
GLUCOSE: 106 mg/dL — AB (ref 65–99)
POTASSIUM: 4.6 mmol/L (ref 3.5–5.2)
SODIUM: 139 mmol/L (ref 134–144)
Total Protein: 6.2 g/dL (ref 6.0–8.5)

## 2017-03-27 LAB — LIPID PANEL
Chol/HDL Ratio: 2.9 ratio (ref 0.0–5.0)
Cholesterol, Total: 132 mg/dL (ref 100–199)
HDL: 45 mg/dL (ref >39)
LDL CALC: 64 mg/dL (ref 0–99)
Triglycerides: 117 mg/dL (ref 0–149)
VLDL CHOLESTEROL CAL: 23 mg/dL (ref 5–40)

## 2017-03-29 MED FILL — LORazepam 0.5 MG TABS: 0.5 | 30 days supply | Qty: 90 | Fill #0

## 2017-03-29 MED FILL — TAMSULOSIN HCL 0.4 MG CAP: 0.4 | 90 days supply | Qty: 90 | Fill #3

## 2017-04-12 MED FILL — ATORVASTATIN 80 MG TABLET: 80 | 90 days supply | Qty: 90 | Fill #1

## 2017-04-16 MED FILL — DIVALPROEX SOD ER 500 MG TA: 500 | 90 days supply | Qty: 90 | Fill #0

## 2017-04-19 MED FILL — PARoxetine HCL 20 MG TABS: 20 | 30 days supply | Qty: 30 | Fill #1

## 2017-04-26 DIAGNOSIS — H348122 Central retinal vein occlusion, left eye, stable: Secondary | ICD-10-CM | POA: Diagnosis not present

## 2017-04-26 DIAGNOSIS — H43811 Vitreous degeneration, right eye: Secondary | ICD-10-CM | POA: Diagnosis not present

## 2017-04-26 DIAGNOSIS — H35031 Hypertensive retinopathy, right eye: Secondary | ICD-10-CM | POA: Diagnosis not present

## 2017-04-26 DIAGNOSIS — H43391 Other vitreous opacities, right eye: Secondary | ICD-10-CM | POA: Diagnosis not present

## 2017-04-26 MED FILL — LORazepam 0.5 MG TABS: 0.5 | 30 days supply | Qty: 90 | Fill #1

## 2017-04-26 MED FILL — traZODone HCL 50 MG TABS: 50 | 90 days supply | Qty: 90 | Fill #0

## 2017-05-17 MED FILL — PARoxetine HCL 20 MG TABS: 20 | 30 days supply | Qty: 30 | Fill #2

## 2017-05-27 ENCOUNTER — Ambulatory Visit (HOSPITAL_COMMUNITY): Payer: 59 | Admitting: Psychiatry

## 2017-05-27 ENCOUNTER — Encounter (HOSPITAL_COMMUNITY): Payer: Self-pay | Admitting: Psychiatry

## 2017-05-27 VITALS — BP 130/88 | HR 82 | Ht 70.5 in | Wt 222.0 lb

## 2017-05-27 DIAGNOSIS — F3160 Bipolar disorder, current episode mixed, unspecified: Secondary | ICD-10-CM

## 2017-05-27 DIAGNOSIS — H9190 Unspecified hearing loss, unspecified ear: Secondary | ICD-10-CM | POA: Diagnosis not present

## 2017-05-27 DIAGNOSIS — Z87891 Personal history of nicotine dependence: Secondary | ICD-10-CM

## 2017-05-27 MED ORDER — TRAZODONE HCL 50 MG PO TABS: 50.0000 mg | ORAL_TABLET | Freq: Every day | ORAL | 2 refills | Status: DC

## 2017-05-27 MED ORDER — LORAZEPAM 0.5 MG PO TABS: 0.5000 mg | ORAL_TABLET | Freq: Three times a day (TID) | ORAL | 2 refills | Status: DC

## 2017-05-27 MED ORDER — DIVALPROEX SODIUM ER 500 MG PO TB24: 500.0000 mg | ORAL_TABLET | Freq: Every day | ORAL | 2 refills | Status: DC

## 2017-05-27 MED ORDER — QUETIAPINE FUMARATE 50 MG PO TABS: 50.0000 mg | ORAL_TABLET | Freq: Every day | ORAL | 2 refills | Status: DC

## 2017-05-27 MED ORDER — PAROXETINE HCL 20 MG PO TABS: 20.0000 mg | ORAL_TABLET | Freq: Every day | ORAL | 2 refills | Status: DC

## 2017-05-27 MED ORDER — VENLAFAXINE HCL ER 75 MG PO CP24: 75.0000 mg | ORAL_CAPSULE | Freq: Every day | ORAL | 2 refills | Status: DC

## 2017-05-27 NOTE — Progress Notes (Signed)
[facility] MD/PA/NP OP Progress Note  05/27/2017 11:43 AM Lawrence Bennett  MRN:  [id number]  Chief Complaint:  Chief Complaint    Depression; Anxiety; Follow-up     HPI: This patient is a 61 year old married white male who lives with his wife and 3 children ages 30,15 and 43 in 50. 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  The patient presents with his wife today. He is very hard of hearing and it's difficult to get a good history from him but she filled in the gaps. She states that he's had depression for at least one year. He started worrying about his job being discontinued or that [name] might close about a year ago. He finally decided to retire early in November. He also had some medical problems going on such as loss of vision and loss of hearing. In 2012 he had colon resection for diverticulosis.  Since retiring in November his mental status has declined. He started staying in bed all the time being unable to function not attending to his ADLs. He stopped eating and lost about 10 pounds. He was not interacting with anyone at all. His primary doctor had put him on Zoloft but it was not helpful. He had been on a low dose of Ativan for years as well. Finally in the end of May his wife brought him to [name] ED for evaluation and from there he was transferred to [facility] geropsychiatry unit.  While on the unit he was diagnosed with severe depression. He was started on a number of medicines including Paxil and Effexor, Seroquel trazodone and Depakote. He also had 4 treatments of ECT while in the hospital and to as an outpatient. His last one was in early June.  Since getting out of the hospital he seems to be doing somewhat better. He is getting out of bed every day and doing things around the house and yard. His family went on a trip to a [place] and he refused to go with them. States that he's had a lot of short-term  memory loss since having ECT but his wife thinks his mood is much better. He's never had psychotic symptoms such as auditory or visual hallucinations or paranoia but his thoughts were somewhat disorganized. He has never been suicidal or homicidal. He does not drink or use drugs. His wife thinks that he slowly getting better but he still not the person he used to be. He complains of dry mouth secondary to all the medications. His Depakote level has not been checked since he left the hospital  The patient returns after 3 months by himself.  He states that he is doing extremely well.  He is sleeping well his energy is good and he is getting out doing a lot of things with his family.  He denies any psychotic symptoms.  He asked if he can cut down on some of his medications but again I do not think this is such a good idea since he is doing so well with them.  He is not having any significant side effects other than occasional weird dreams.  All of his lab work looks good and his A1c is gone down to 5.6 and his cholesterol panel is excellent so I do not see any big push to get him off these medicines.  He denies suicidal thinking Visit Diagnosis:    ICD-10-CM   1. Bipolar I disorder, most  recent episode mixed ([place]) F31.60     Past Psychiatric History: None prior to 2015  Past Medical History:  Past Medical History:  Diagnosis Date  . Anxiety   . Atherosclerosis of aorta ([place])   . Atrial contractions, premature   . Depression   . Dyslipidemia   . Hyperlipidemia   . Mild hypertension   . Urinary hesitancy     Past Surgical History:  Procedure Laterality Date  . COLON RESECTION      Family Psychiatric History: See below  Family History:  Family History  Problem Relation Age of Onset  . Stroke Father   . Bipolar disorder Father   . Hypertension Sister   . Depression Sister   . COPD Mother     Social History:  Social History   Socioeconomic History  . Marital status: Married     Spouse name: None  . Number of children: None  . Years of education: None  . Highest education level: None  Social Needs  . Financial resource strain: None  . Food insecurity - worry: None  . Food insecurity - inability: None  . Transportation needs - medical: None  . Transportation needs - non-medical: None  Occupational History  . None  Tobacco Use  . Smoking status: Former Smoker    Packs/day: 0.20    Years: 19.00    Pack years: 3.80    Types: Cigarettes  . Smokeless tobacco: Never Used  Substance and Sexual Activity  . Alcohol use: No    Alcohol/week: 0.0 oz  . Drug use: No  . Sexual activity: Not Currently  Other Topics Concern  . None  Social History Narrative  . None    Allergies: No Known Allergies  Metabolic Disorder Labs: No results found for: HGBA1C, MPG No results found for: PROLACTIN Lab Results  Component Value Date   CHOL 132 03/26/2017   TRIG 117 03/26/2017   HDL 45 03/26/2017   CHOLHDL 2.9 03/26/2017   LDLCALC 64 03/26/2017   LDLCALC 82 09/21/2016   Lab Results  Component Value Date   TSH 1.430 11/11/2015   TSH 0.797 05/02/2014    Therapeutic Level Labs: No results found for: LITHIUM Lab Results  Component Value Date   VALPROATE 41.8 (L) 11/29/2016   VALPROATE 44 (L) 11/11/2015   No components found for:  CBMZ  Current Medications: Current Outpatient Medications  Medication Sig Dispense Refill  . amLODipine (NORVASC) 5 MG tablet Take 1 tablet (5 mg total) by mouth daily. 90 tablet 3  . aspirin EC 81 MG tablet Take 1 tablet (81 mg total) by mouth daily. 90 tablet 3  . atorvastatin (LIPITOR) 80 MG tablet TAKE 1 TABLET BY MOUTH DAILY. 180 tablet 0  . carbamide peroxide (DEBROX) 6.5 % otic solution Apply every 2 weeks just before your Saturday shower 15 mL 0  . divalproex (DEPAKOTE ER) 500 MG 24 hr tablet Take 1 tablet (500 mg total) by mouth at bedtime. 90 tablet 2  . LORazepam (ATIVAN) 0.5 MG tablet Take 1 tablet (0.5 mg total) by  mouth 3 (three) times daily. 90 tablet 2  . PARoxetine (PAXIL) 20 MG tablet Take 1 tablet (20 mg total) by mouth at bedtime. 30 tablet 2  . QUEtiapine (SEROQUEL) 50 MG tablet Take 1 tablet (50 mg total) by mouth at bedtime. 90 tablet 2  . tamsulosin (FLOMAX) 0.4 MG CAPS capsule Take 1 capsule (0.4 mg total) by mouth daily. 90 capsule 3  . traZODone (DESYREL) 50  MG tablet Take 1 tablet (50 mg total) by mouth at bedtime. 90 tablet 2  . venlafaxine XR (EFFEXOR-XR) 75 MG 24 hr capsule Take 1 capsule (75 mg total) by mouth daily with breakfast. 90 capsule 2   No current facility-administered medications for this visit.      Musculoskeletal: Strength & Muscle Tone: within normal limits Gait & Station: normal Patient leans: N/A  Psychiatric Specialty Exam: Review of Systems  HENT: Positive for hearing loss.   All other systems reviewed and are negative.   Blood pressure 130/88, pulse 82, height 5' 10.5" (1.791 m), weight 222 lb (100.7 kg), SpO2 96 %.Body mass index is 31.4 kg/m.  General Appearance: Casual and Fairly Groomed  Eye Contact:  Good  Speech:  Clear and Coherent  Volume:  Normal  Mood:  Euthymic  Affect:  Congruent  Thought Process:  Goal Directed  Orientation:  Full (Time, Place, and Person)  Thought Content: WDL   Suicidal Thoughts:  No  Homicidal Thoughts:  No  Memory:  Immediate;   Good Recent;   Good Remote;   Fair  Judgement:  Fair  Insight:  Fair  Psychomotor Activity:  Normal  Concentration:  Concentration: Fair and Attention Span: Fair  Recall:  Good  Fund of Knowledge: Good  Language: Good  Akathisia:  No  Handed:  Right  AIMS (if indicated): not done  Assets:  Communication Skills Desire for Improvement Leisure Time Physical Health Resilience Social Support  ADL's:  Intact  Cognition: WNL  Sleep:  Good   Screenings: PHQ2-9     Office Visit from 03/26/2017 in [facility] [place] Office Visit from 12/14/2016 in [place] Office Visit from 09/21/2016 in [facility] Office Visit from 09/14/2016 in [place] Visit from 06/22/2016 in [place]  PHQ-2 Total Score  0  0  0  0  0       Assessment and Plan: This patient is a 61 year old male whose had a significant history in the last 3-4 years of severe depression.  He is doing very well now on his current regimen and I would not recommend stopping any of these medications.  He will continue Effexor XR 75 mg every morning, Paxil 20 mg nightly both for depression, trazodone 50 mg at bedtime for sleep, Depakote ER 500 mg at bedtime for mood stabilization, Seroquel 50 mg at bedtime for mood stabilization and Ativan 0.5 mg 3 times daily for anxiety.  He will return to see me in 3 months   Levonne Spiller, MD 05/27/2017, 11:43 AM

## 2017-05-28 MED FILL — LORazepam 0.5 MG TABS: 0.5 | 30 days supply | Qty: 90 | Fill #2

## 2017-06-21 MED FILL — PARoxetine HCL 20 MG TABS: 20 | 30 days supply | Qty: 30 | Fill #0

## 2017-06-21 MED FILL — QUETIAPINE FUMARATE 50 MG T: 50 | 90 days supply | Qty: 90 | Fill #1

## 2017-06-21 MED FILL — AMLODIPINE BESYLATE 5 MG TA: 5 | 90 days supply | Qty: 90 | Fill #1

## 2017-06-21 MED FILL — VENLAFAXINE HCL ER 75 MG CA: 75 | 90 days supply | Qty: 90 | Fill #0

## 2017-06-27 MED FILL — LORazepam 0.5 MG TABS: 0.5 | 30 days supply | Qty: 90 | Fill #0

## 2017-07-11 ENCOUNTER — Other Ambulatory Visit: Payer: Self-pay | Admitting: Family Medicine

## 2017-07-11 MED FILL — TAMSULOSIN HCL 0.4 MG CAP: 0.4 | 90 days supply | Qty: 90 | Fill #0

## 2017-07-11 MED FILL — DIVALPROEX SOD ER 500 MG TA: 500 | 90 days supply | Qty: 90 | Fill #1

## 2017-07-11 MED FILL — ATORVASTATIN 80 MG TABLET: 80 | 90 days supply | Qty: 90 | Fill #0

## 2017-07-23 MED FILL — PARoxetine HCL 20 MG TABS: 20 | 30 days supply | Qty: 30 | Fill #1

## 2017-07-25 ENCOUNTER — Ambulatory Visit: Payer: 59 | Admitting: Family Medicine

## 2017-07-25 ENCOUNTER — Encounter: Payer: Self-pay | Admitting: Family Medicine

## 2017-07-25 VITALS — BP 135/81 | HR 81 | Temp 98.6°F | Ht 70.5 in | Wt 225.0 lb

## 2017-07-25 DIAGNOSIS — N401 Enlarged prostate with lower urinary tract symptoms: Secondary | ICD-10-CM | POA: Diagnosis not present

## 2017-07-25 DIAGNOSIS — R3912 Poor urinary stream: Secondary | ICD-10-CM

## 2017-07-25 DIAGNOSIS — L608 Other nail disorders: Secondary | ICD-10-CM | POA: Diagnosis not present

## 2017-07-25 DIAGNOSIS — H6122 Impacted cerumen, left ear: Secondary | ICD-10-CM | POA: Diagnosis not present

## 2017-07-25 DIAGNOSIS — R7303 Prediabetes: Secondary | ICD-10-CM | POA: Diagnosis not present

## 2017-07-25 DIAGNOSIS — R195 Other fecal abnormalities: Secondary | ICD-10-CM

## 2017-07-25 LAB — BAYER DCA HB A1C WAIVED: HB A1C (BAYER DCA - WAIVED): 5.7 % (ref <7.0)

## 2017-07-25 MED ORDER — TAMSULOSIN HCL 0.4 MG PO CAPS: 0.4000 mg | ORAL_CAPSULE | Freq: Two times a day (BID) | ORAL | 3 refills | Status: DC

## 2017-07-25 MED FILL — LORazepam 0.5 MG TABS: 0.5 | 30 days supply | Qty: 90 | Fill #1

## 2017-07-25 NOTE — Progress Notes (Signed)
   HPI  Patient presents today here for follow-up chronic medical problems.  BPH Still with weak stream, patient also notes that he has had an odd change in his stool.  He states that he has to wipe and wipe and then work hard to get the last bit of stool out. He has not used any fiber supplements. He has changed his diet over the last 2-3 months.  He comes in asking for dietary and exercise recommendations for weight loss.  Prediabetes He is watching his diet.  Cerumen-patient states that hearing seems to be little bit worse lately, he is wondering if he has impacted cerumen again.  He is also noticed a change in his left great toenail over the last month or so.  No pain or injury.  PMH: Smoking status noted ROS: Per HPI  Objective: BP 135/81   Pulse 81   Temp 98.6 F (37 C) (Oral)   Ht 5' 10.5" (1.791 m)   Wt 225 lb (102.1 kg)   BMI 31.83 kg/m  Gen: NAD, alert, cooperative with exam HEENT: NCAT, bilateral TMs obscured by cerumen, right TM visible after gentle curetting, however still heavy cerumen, left TM completely obscured by cerumen and only a small amount removed with curette. Irrigated by nursing, TMs reported clearly visible bilaterally afterwards CV: RRR, good S1/S2 Resp: CTABL, no wheezes, non-labored Ext: No edema, warm Neuro: Alert and oriented, No gross deficits DRE Normal tone, no mass or hemorrhoid appreciated Enlarged nontender symmetric prostate  Left great toenail with approximately 1-1/2 cm triangular yellowing extending from the tip of the toenail towards the cuticle not reaching the cuticle.  Assessment and plan:  #BPH Enlarged prostate, symptoms not well controlled Increase Flomax to 0.8 mg daily, PSA  #Change in stool Recommended increasing fiber, this is likely due to dietary changes recently Rectal exam is normal, C scope is up-to-date  #Prediabetes Patient is watching his diet, has not had much weight loss A1c pending  #Impacted  cerumen of the left ear Irrigated by nursing with good result, right TM cleared by gentle curettage with irrigation also applied at the end  Toenail deformity Unusual deformity, likely onychomycosis, monitor for now    Orders Placed This Encounter  Procedures  . PSA  . Bayer DCA Hb A1c Waived    Meds ordered this encounter  Medications  . tamsulosin (FLOMAX) 0.4 MG CAPS capsule    Sig: Take 1 capsule (0.4 mg total) by mouth 2 (two) times daily.    Dispense:  180 capsule    Refill:  [place], MD [facility] 07/25/2017, 11:59 AM

## 2017-07-25 NOTE — Patient Instructions (Signed)
Great to see you!  Try flomax 1 pill twice daily.   We will call with your labs within 1 week

## 2017-07-26 LAB — PSA: Prostate Specific Ag, Serum: 1.9 ng/mL (ref 0.0–4.0)

## 2017-07-29 ENCOUNTER — Encounter: Payer: Self-pay | Admitting: Family Medicine

## 2017-08-08 MED FILL — traZODone HCL 50 MG TABS: 50 | 90 days supply | Qty: 90 | Fill #1

## 2017-08-16 MED FILL — PARoxetine HCL 20 MG TABS: 20 | 30 days supply | Qty: 30 | Fill #2

## 2017-08-22 MED FILL — LORazepam 0.5 MG TABS: 0.5 | 30 days supply | Qty: 90 | Fill #2

## 2017-08-27 ENCOUNTER — Encounter (HOSPITAL_COMMUNITY): Payer: Self-pay | Admitting: Psychiatry

## 2017-08-27 ENCOUNTER — Ambulatory Visit (HOSPITAL_COMMUNITY): Payer: 59 | Admitting: Psychiatry

## 2017-08-27 VITALS — BP 136/93 | HR 96 | Ht 70.5 in | Wt 224.0 lb

## 2017-08-27 DIAGNOSIS — H9190 Unspecified hearing loss, unspecified ear: Secondary | ICD-10-CM | POA: Diagnosis not present

## 2017-08-27 DIAGNOSIS — Z87891 Personal history of nicotine dependence: Secondary | ICD-10-CM | POA: Diagnosis not present

## 2017-08-27 DIAGNOSIS — Z818 Family history of other mental and behavioral disorders: Secondary | ICD-10-CM

## 2017-08-27 DIAGNOSIS — F3160 Bipolar disorder, current episode mixed, unspecified: Secondary | ICD-10-CM | POA: Diagnosis not present

## 2017-08-27 MED ORDER — VENLAFAXINE HCL ER 75 MG PO CP24: 75.0000 mg | ORAL_CAPSULE | Freq: Every day | ORAL | 2 refills | Status: DC

## 2017-08-27 MED ORDER — PAROXETINE HCL 20 MG PO TABS: 20.0000 mg | ORAL_TABLET | Freq: Every day | ORAL | 2 refills | Status: DC

## 2017-08-27 MED ORDER — TRAZODONE HCL 50 MG PO TABS: 50.0000 mg | ORAL_TABLET | Freq: Every day | ORAL | 2 refills | Status: DC

## 2017-08-27 MED ORDER — DIVALPROEX SODIUM ER 500 MG PO TB24: 500.0000 mg | ORAL_TABLET | Freq: Every day | ORAL | 2 refills | Status: DC

## 2017-08-27 MED ORDER — QUETIAPINE FUMARATE 50 MG PO TABS: 50.0000 mg | ORAL_TABLET | Freq: Every day | ORAL | 2 refills | Status: DC

## 2017-08-27 MED ORDER — LORAZEPAM 0.5 MG PO TABS: 0.5000 mg | ORAL_TABLET | Freq: Three times a day (TID) | ORAL | 2 refills | Status: DC

## 2017-08-27 NOTE — Progress Notes (Signed)
[facility] MD/PA/NP OP Progress Note  08/27/2017 11:51 AM Lawrence Bennett  MRN:  [id number]  Chief Complaint:  Chief Complaint    Depression; Anxiety; Follow-up     HPI: This patient is a 62 year old married white male who lives with his wife and 3 children ages 26,15 and 64 in 80. 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  The patient presents with his wife today. He is very hard of hearing and it's difficult to get a good history from him but she filled in the gaps. She states that he's had depression for at least one year. He started worrying about his job being discontinued or that [name] might close about a year ago. He finally decided to retire early in November. He also had some medical problems going on such as loss of vision and loss of hearing. In 2012 he had colon resection for diverticulosis.  Since retiring in November his mental status has declined. He started staying in bed all the time being unable to function not attending to his ADLs. He stopped eating and lost about 10 pounds. He was not interacting with anyone at all. His primary doctor had put him on Zoloft but it was not helpful. He had been on a low dose of Ativan for years as well. Finally in the end of May his wife brought him to [name] ED for evaluation and from there he was transferred to [facility] geropsychiatry unit.  While on the unit he was diagnosed with severe depression. He was started on a number of medicines including Paxil and Effexor, Seroquel trazodone and Depakote. He also had 4 treatments of ECT while in the hospital and to as an outpatient. His last one was in early June.  Since getting out of the hospital he seems to be doing somewhat better. He is getting out of bed every day and doing things around the house and yard. His family went on a trip to a [place] and he refused to go with them. States that he's had a lot of short-term  memory loss since having ECT but his wife thinks his mood is much better. He's never had psychotic symptoms such as auditory or visual hallucinations or paranoia but his thoughts were somewhat disorganized. He has never been suicidal or homicidal. He does not drink or use drugs. His wife thinks that he slowly getting better but he still not the person he used to be. He complains of dry mouth secondary to all the medications. His Depakote level has not been checked since he left the hospital  The patient returns after 3 months.  He states that he is doing well.  He is sleeping well his energy is good and his mood has been excellent.  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   Visit Diagnosis:    ICD-10-CM   1. Bipolar I disorder, most recent episode mixed ([place]) F31.60     Past Psychiatric History: Hospitalized in 2015 no prior history  Past Medical History:  Past Medical History:  Diagnosis Date  . Anxiety   . Atherosclerosis of aorta ([place])   . Atrial contractions, premature   . Depression   . Dyslipidemia   . Hyperlipidemia   . Mild hypertension   . Urinary hesitancy     Past Surgical History:  Procedure Laterality Date  . COLON RESECTION  Family Psychiatric History: See below Family History:  Family History  Problem Relation Age of Onset  . Stroke Father   . Bipolar disorder Father   . Hypertension Sister   . Depression Sister   . COPD Mother     Social History:  Social History   Socioeconomic History  . Marital status: Married    Spouse name: None  . Number of children: None  . Years of education: None  . Highest education level: None  Social Needs  . Financial resource strain: None  . Food insecurity - worry: None  . Food insecurity - inability: None  . Transportation needs - medical: None  . Transportation needs - non-medical: None  Occupational History  . None  Tobacco Use  . Smoking status: Former Smoker     Packs/day: 0.20    Years: 19.00    Pack years: 3.80    Types: Cigarettes  . Smokeless tobacco: Never Used  Substance and Sexual Activity  . Alcohol use: No    Alcohol/week: 0.0 oz  . Drug use: No  . Sexual activity: Not Currently  Other Topics Concern  . None  Social History Narrative  . None    Allergies: No Known Allergies  Metabolic Disorder Labs: No results found for: HGBA1C, MPG No results found for: PROLACTIN Lab Results  Component Value Date   CHOL 132 03/26/2017   TRIG 117 03/26/2017   HDL 45 03/26/2017   CHOLHDL 2.9 03/26/2017   LDLCALC 64 03/26/2017   LDLCALC 82 09/21/2016   Lab Results  Component Value Date   TSH 1.430 11/11/2015   TSH 0.797 05/02/2014    Therapeutic Level Labs: No results found for: LITHIUM Lab Results  Component Value Date   VALPROATE 41.8 (L) 11/29/2016   VALPROATE 44 (L) 11/11/2015   No components found for:  CBMZ  Current Medications: Current Outpatient Medications  Medication Sig Dispense Refill  . amLODipine (NORVASC) 5 MG tablet Take 1 tablet (5 mg total) by mouth daily. 90 tablet 3  . aspirin EC 81 MG tablet Take 1 tablet (81 mg total) by mouth daily. 90 tablet 3  . atorvastatin (LIPITOR) 80 MG tablet TAKE 1 TABLET BY MOUTH DAILY. 180 tablet 0  . carbamide peroxide (DEBROX) 6.5 % otic solution Apply every 2 weeks just before your Saturday shower 15 mL 0  . divalproex (DEPAKOTE ER) 500 MG 24 hr tablet Take 1 tablet (500 mg total) by mouth at bedtime. 90 tablet 2  . LORazepam (ATIVAN) 0.5 MG tablet Take 1 tablet (0.5 mg total) by mouth 3 (three) times daily. 90 tablet 2  . PARoxetine (PAXIL) 20 MG tablet Take 1 tablet (20 mg total) by mouth at bedtime. 30 tablet 2  . QUEtiapine (SEROQUEL) 50 MG tablet Take 1 tablet (50 mg total) by mouth at bedtime. 90 tablet 2  . tamsulosin (FLOMAX) 0.4 MG CAPS capsule Take 1 capsule (0.4 mg total) by mouth 2 (two) times daily. 180 capsule 3  . traZODone (DESYREL) 50 MG tablet Take 1 tablet  (50 mg total) by mouth at bedtime. 90 tablet 2  . venlafaxine XR (EFFEXOR-XR) 75 MG 24 hr capsule Take 1 capsule (75 mg total) by mouth daily with breakfast. 90 capsule 2   No current facility-administered medications for this visit.      Musculoskeletal: Strength & Muscle Tone: within normal limits Gait & Station: normal Patient leans: N/A  Psychiatric Specialty Exam: Review of Systems  HENT: Positive for congestion and hearing loss.  All other systems reviewed and are negative.   Blood pressure (!) 136/93, pulse 96, height 5' 10.5" (1.791 m), weight 224 lb (101.6 kg), SpO2 95 %.Body mass index is 31.69 kg/m.  General Appearance: Casual and Fairly Groomed  Eye Contact:  Good  Speech:  Clear and Coherent  Volume:  Normal  Mood:  Euthymic  Affect:  Congruent  Thought Process:  Goal Directed  Orientation:  Full (Time, Place, and Person)  Thought Content: WDL   Suicidal Thoughts:  No  Homicidal Thoughts:  No  Memory:  Immediate;   Good Recent;   Good Remote;   NA  Judgement:  Fair  Insight:  Fair  Psychomotor Activity:  Normal  Concentration:  Concentration: Good and Attention Span: Good  Recall:  Good  Fund of Knowledge: Good  Language: Good  Akathisia:  No  Handed:  Right  AIMS (if indicated): not done  Assets:  Communication Skills Desire for Improvement Physical Health Resilience Social Support Talents/Skills  ADL's:  Intact  Cognition: WNL  Sleep:  Good   Screenings: PHQ2-9     Office Visit from 07/25/2017 in [place] Visit from 03/26/2017 in [place] Visit from 12/14/2016 in [place] Visit from 09/21/2016 in [place] Visit from 09/14/2016 in [place]  PHQ-2 Total Score  0  0  0  0  0       Assessment and Plan: Patient is a 62 year old male with a history of depression and anxiety.  He is doing very  well on his current regimen.  He will continue Effexor XR 75 mg daily and Paxil 20 mg daily for depression, Depakote ER 500 mg at bedtime for mood stabilization as well as Seroquel 50 mg also for mood stabilization trazodone 50 mg at bedtime for sleep and lorazepam 0.5 mg 3 times daily for anxiety.  He will return to see me in 3 months   Levonne Spiller, MD 08/27/2017, 11:51 AM

## 2017-08-28 ENCOUNTER — Encounter: Payer: Self-pay | Admitting: Nurse Practitioner

## 2017-08-28 ENCOUNTER — Ambulatory Visit: Payer: 59 | Admitting: Nurse Practitioner

## 2017-08-28 ENCOUNTER — Telehealth: Payer: Self-pay | Admitting: Family Medicine

## 2017-08-28 VITALS — BP 125/80 | HR 91 | Temp 97.6°F | Ht 70.0 in | Wt 227.0 lb

## 2017-08-28 DIAGNOSIS — J069 Acute upper respiratory infection, unspecified: Secondary | ICD-10-CM

## 2017-08-28 MED ORDER — AZITHROMYCIN 250 MG PO TABS: ORAL_TABLET | ORAL | 0 refills | Status: DC

## 2017-08-28 MED ORDER — CHLORPHEN-PE-ACETAMINOPHEN 4-10-325 MG PO TABS: 1.0000 | ORAL_TABLET | Freq: Four times a day (QID) | ORAL | 0 refills | Status: DC | PRN

## 2017-08-28 MED ORDER — AMOXICILLIN-POT CLAVULANATE 875-125 MG PO TABS: 1.0000 | ORAL_TABLET | Freq: Two times a day (BID) | ORAL | 0 refills | Status: DC

## 2017-08-28 MED FILL — AMOXICILLIN-POT CLAVULANATE: 875-125 | 7 days supply | Qty: 14 | Fill #0

## 2017-08-28 NOTE — Progress Notes (Signed)
   Subjective:    Patient ID: Lawrence Bennett, male    DOB: [birth year], 62 y.o.   MRN: [id number]  HPI patient comes in today c/o congestion. It started off with sneezing and blowing nose. Sunday night he developed a fever and cough. Coughing up greenish phlegm. Has had headache for 2 days. He tried elderberry tablets which have not helped.    Review of Systems  Constitutional: Positive for appetite change (decreased), chills and fever (never checked temperature, just felt hot.).  HENT: Positive for congestion, rhinorrhea, sore throat, trouble swallowing and voice change. Negative for ear pain.   Respiratory: Positive for cough (productive- greeninsh).   Cardiovascular: Negative.   Gastrointestinal: Negative.   Genitourinary: Negative.   Neurological: Positive for headaches.  Psychiatric/Behavioral: Negative.   All other systems reviewed and are negative.      Objective:   Physical Exam  Constitutional: He is oriented to person, place, and time. He appears well-developed and well-nourished. He appears distressed (mild).  HENT:  Right Ear: Hearing, tympanic membrane, external ear and ear canal normal.  Left Ear: Hearing, tympanic membrane, external ear and ear canal normal.  Nose: Mucosal edema and rhinorrhea present. Right sinus exhibits no maxillary sinus tenderness and no frontal sinus tenderness. Left sinus exhibits no maxillary sinus tenderness and no frontal sinus tenderness.  Mouth/Throat: Uvula is midline and mucous membranes are normal. Posterior oropharyngeal erythema (mild) present.  Neck: Normal range of motion. Neck supple.  Cardiovascular: Normal rate and regular rhythm.  Pulmonary/Chest: Effort normal and breath sounds normal. No respiratory distress. He has no wheezes. He has no rales.  Dry cough   Lymphadenopathy:    He has no cervical adenopathy.  Neurological: He is alert and oriented to person, place, and time.  Skin: Skin is warm and dry.  Psychiatric: He has  a normal mood and affect. His behavior is normal. Judgment and thought content normal.   BP 125/80   Pulse 91   Temp 97.6 F (36.4 C) (Oral)   Ht 5\' 10"  (1.778 m)   Wt 227 lb (103 kg)   BMI 32.57 kg/m         Assessment & Plan:  1. Upper respiratory infection with cough and congestion 1. Take meds as prescribed 2. Use a cool mist humidifier especially during the winter months and when heat has been humid. 3. Use saline nose sprays frequently 4. Saline irrigations of the nose can be very helpful if done frequently.  * 4X daily for 1 week*  * Use of a nettie pot can be helpful with this. Follow directions with this* 5. Drink plenty of fluids 6. Keep thermostat turn down low 7.For any cough or congestion  Use plain Mucinex- regular strength or max strength is fine   * Children- consult with Pharmacist for dosing 8. For fever or aces or pains- take tylenol or ibuprofen appropriate for age and weight.  * for fevers greater than 101 orally you may alternate ibuprofen and tylenol every  3 hours.    - azithromycin (ZITHROMAX Z-PAK) 250 MG tablet; As directed  Dispense: 6 tablet; Refill: 0 - Chlorphen-PE-Acetaminophen 4-10-325 MG TABS; Take 1 tablet by mouth every 6 (six) hours as needed.  Dispense: 6 tablet; Refill: 0  Mary-Margaret Hassell Done, FNP

## 2017-08-28 NOTE — Addendum Note (Signed)
Addended by: Chevis Pretty on: 08/28/2017 03:41 PM   Modules accepted: Orders

## 2017-08-28 NOTE — Patient Instructions (Signed)

## 2017-08-29 NOTE — Telephone Encounter (Signed)
Lawrence Bennett and [name] have already taken care of this.

## 2017-09-19 MED FILL — LORazepam 0.5 MG TABS: 0.5 | 30 days supply | Qty: 90 | Fill #0

## 2017-09-19 MED FILL — QUETIAPINE FUMARATE 50 MG T: 50 | 90 days supply | Qty: 90 | Fill #2

## 2017-09-19 MED FILL — PARoxetine HCL 20 MG TABS: 20 | 30 days supply | Qty: 30 | Fill #0

## 2017-09-19 MED FILL — AMLODIPINE BESYLATE 5 MG TA: 5 | 90 days supply | Qty: 90 | Fill #2

## 2017-09-19 MED FILL — VENLAFAXINE HCL ER 75 MG CA: 75 | 90 days supply | Qty: 90 | Fill #1

## 2017-10-10 MED FILL — ATORVASTATIN 80 MG TABLET: 80 | 90 days supply | Qty: 90 | Fill #1

## 2017-10-10 MED FILL — DIVALPROEX SOD ER 500 MG TA: 500 | 90 days supply | Qty: 90 | Fill #0

## 2017-10-10 MED FILL — TAMSULOSIN HCL 0.4 MG CAP: 0.4 | 90 days supply | Qty: 180 | Fill #0

## 2017-10-17 MED FILL — PARoxetine HCL 20 MG TABS: 20 | 30 days supply | Qty: 30 | Fill #1

## 2017-10-17 MED FILL — LORazepam 0.5 MG TABS: 0.5 | 30 days supply | Qty: 90 | Fill #1

## 2017-10-31 MED FILL — traZODone HCL 50 MG TABS: 50 | 90 days supply | Qty: 90 | Fill #2

## 2017-11-21 ENCOUNTER — Encounter: Payer: Self-pay | Admitting: Family Medicine

## 2017-11-21 ENCOUNTER — Ambulatory Visit: Payer: 59 | Admitting: Family Medicine

## 2017-11-21 VITALS — BP 131/83 | HR 80 | Temp 97.5°F | Ht 70.0 in | Wt 229.0 lb

## 2017-11-21 DIAGNOSIS — R7303 Prediabetes: Secondary | ICD-10-CM

## 2017-11-21 DIAGNOSIS — H6121 Impacted cerumen, right ear: Secondary | ICD-10-CM

## 2017-11-21 DIAGNOSIS — E785 Hyperlipidemia, unspecified: Secondary | ICD-10-CM | POA: Diagnosis not present

## 2017-11-21 LAB — BAYER DCA HB A1C WAIVED: HB A1C (BAYER DCA - WAIVED): 6 % (ref <7.0)

## 2017-11-21 MED FILL — PARoxetine HCL 20 MG TABS: 20 | 30 days supply | Qty: 30 | Fill #2

## 2017-11-21 MED FILL — LORazepam 0.5 MG TABS: 0.5 | 30 days supply | Qty: 90 | Fill #2

## 2017-11-21 NOTE — Progress Notes (Signed)
   HPI  Patient presents today here for follow-up chronic medical conditions.  Patient feels well and has no complaints except for right ear fullness and decreased hearing today.  We discussed history of prediabetes, he is states he is watching his diet moderately. He is taking his medications daily without side effects.  Is nonfasting.  good medication compliance and tolerance of Lipitor  PMH: Smoking status noted ROS: Per HPI  Objective: BP 131/83   Pulse 80   Temp (!) 97.5 F (36.4 C) (Oral)   Ht '5\' 10"'$  (1.778 m)   Wt 229 lb (103.9 kg)   BMI 32.86 kg/m  Gen: NAD, alert, cooperative with exam HEENT: NCAT, right TM obscured by cerumen, left TM within normal limits, cerumen removed easily with curette exposing normal TM CV: RRR, good S1/S2, no murmur Resp: CTABL, no wheezes, non-labored Ext: No edema, warm Neuro: Alert and oriented, No gross deficits  Assessment and plan:  #Cerumen impaction right ear Resolved with curettage today, patient can hear better before leaving  #Prediabetes Repeat labs, discussed diet Clinically stable  #Hyperlipidemia With LDL goal less than 70 with history of stroke Labs today- non fasting   Orders Placed This Encounter  Procedures  . CMP14+EGFR  . CBC with Differential/Platelet  . Lipid panel  . Bayer [facility] Hb A1c Carney Bern, MD [facility] 11/21/2017, 10:54 AM

## 2017-11-21 NOTE — Patient Instructions (Signed)
Great to see you!  Come back to see Dr. Lajuana Ripple in 6 months

## 2017-11-22 ENCOUNTER — Telehealth: Payer: Self-pay | Admitting: Family Medicine

## 2017-11-22 LAB — CBC WITH DIFFERENTIAL/PLATELET
BASOS: 1 %
Basophils Absolute: 0.1 10*3/uL (ref 0.0–0.2)
EOS (ABSOLUTE): 0.2 10*3/uL (ref 0.0–0.4)
Eos: 3 %
HEMATOCRIT: 42.5 % (ref 37.5–51.0)
HEMOGLOBIN: 14.6 g/dL (ref 13.0–17.7)
IMMATURE GRANS (ABS): 0 10*3/uL (ref 0.0–0.1)
Immature Granulocytes: 0 %
LYMPHS ABS: 1.7 10*3/uL (ref 0.7–3.1)
Lymphs: 32 %
MCH: 29.9 pg (ref 26.6–33.0)
MCHC: 34.4 g/dL (ref 31.5–35.7)
MCV: 87 fL (ref 79–97)
MONOCYTES: 12 %
Monocytes Absolute: 0.7 10*3/uL (ref 0.1–0.9)
NEUTROS ABS: 2.8 10*3/uL (ref 1.4–7.0)
NEUTROS PCT: 52 %
Platelets: 247 10*3/uL (ref 150–450)
RBC: 4.88 x10E6/uL (ref 4.14–5.80)
RDW: 14.6 % (ref 12.3–15.4)
WBC: 5.4 10*3/uL (ref 3.4–10.8)

## 2017-11-22 LAB — CMP14+EGFR
ALK PHOS: 101 IU/L (ref 39–117)
ALT: 24 IU/L (ref 0–44)
AST: 19 IU/L (ref 0–40)
Albumin/Globulin Ratio: 1.5 (ref 1.2–2.2)
Albumin: 3.9 g/dL (ref 3.6–4.8)
BILIRUBIN TOTAL: 0.4 mg/dL (ref 0.0–1.2)
BUN/Creatinine Ratio: 12 (ref 10–24)
BUN: 12 mg/dL (ref 8–27)
CHLORIDE: 106 mmol/L (ref 96–106)
CO2: 20 mmol/L (ref 20–29)
CREATININE: 1.04 mg/dL (ref 0.76–1.27)
Calcium: 9.1 mg/dL (ref 8.6–10.2)
GFR calc Af Amer: 88 mL/min/{1.73_m2} (ref >59)
GFR calc non Af Amer: 76 mL/min/{1.73_m2} (ref >59)
Globulin, Total: 2.6 g/dL (ref 1.5–4.5)
Glucose: 104 mg/dL — ABNORMAL HIGH (ref 65–99)
Potassium: 4.6 mmol/L (ref 3.5–5.2)
Sodium: 140 mmol/L (ref 134–144)
Total Protein: 6.5 g/dL (ref 6.0–8.5)

## 2017-11-22 LAB — LIPID PANEL
CHOL/HDL RATIO: 3.1 ratio (ref 0.0–5.0)
Cholesterol, Total: 159 mg/dL (ref 100–199)
HDL: 51 mg/dL (ref >39)
LDL CALC: 87 mg/dL (ref 0–99)
Triglycerides: 104 mg/dL (ref 0–149)
VLDL CHOLESTEROL CAL: 21 mg/dL (ref 5–40)

## 2017-11-22 NOTE — Telephone Encounter (Signed)
Refer to lab notes 

## 2017-11-27 ENCOUNTER — Encounter (HOSPITAL_COMMUNITY): Payer: Self-pay | Admitting: Psychiatry

## 2017-11-27 ENCOUNTER — Ambulatory Visit (HOSPITAL_COMMUNITY): Payer: 59 | Admitting: Psychiatry

## 2017-11-27 ENCOUNTER — Other Ambulatory Visit: Payer: Self-pay | Admitting: Dermatology

## 2017-11-27 VITALS — BP 144/82 | HR 86 | Ht 70.0 in | Wt 228.0 lb

## 2017-11-27 DIAGNOSIS — Z818 Family history of other mental and behavioral disorders: Secondary | ICD-10-CM | POA: Diagnosis not present

## 2017-11-27 DIAGNOSIS — Z87891 Personal history of nicotine dependence: Secondary | ICD-10-CM | POA: Diagnosis not present

## 2017-11-27 DIAGNOSIS — F3160 Bipolar disorder, current episode mixed, unspecified: Secondary | ICD-10-CM | POA: Diagnosis not present

## 2017-11-27 DIAGNOSIS — D229 Melanocytic nevi, unspecified: Secondary | ICD-10-CM | POA: Diagnosis not present

## 2017-11-27 DIAGNOSIS — D485 Neoplasm of uncertain behavior of skin: Secondary | ICD-10-CM | POA: Diagnosis not present

## 2017-11-27 MED ORDER — DIVALPROEX SODIUM ER 500 MG PO TB24: 500.0000 mg | ORAL_TABLET | Freq: Every day | ORAL | 2 refills | Status: DC

## 2017-11-27 MED ORDER — TRAZODONE HCL 50 MG PO TABS: 50.0000 mg | ORAL_TABLET | Freq: Every day | ORAL | 2 refills | Status: DC

## 2017-11-27 MED ORDER — LORAZEPAM 0.5 MG PO TABS: 0.5000 mg | ORAL_TABLET | Freq: Three times a day (TID) | ORAL | 2 refills | Status: DC

## 2017-11-27 MED ORDER — QUETIAPINE FUMARATE 50 MG PO TABS: 50.0000 mg | ORAL_TABLET | Freq: Every day | ORAL | 2 refills | Status: DC

## 2017-11-27 MED ORDER — VENLAFAXINE HCL ER 75 MG PO CP24: 75.0000 mg | ORAL_CAPSULE | Freq: Every day | ORAL | 2 refills | Status: DC

## 2017-11-27 MED ORDER — PAROXETINE HCL 20 MG PO TABS: 20.0000 mg | ORAL_TABLET | Freq: Every day | ORAL | 2 refills | Status: DC

## 2017-11-27 NOTE — Progress Notes (Signed)
[facility] MD/PA/NP OP Progress Note  11/27/2017 11:28 AM Lawrence Bennett  MRN:  [id number]  Chief Complaint:  Chief Complaint    Depression; Anxiety; Follow-up     HPI: This patient is a 62 year old married white male who lives with his wife and 3 children in [place]. 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  The patient presents with his wife today. He is very hard of hearing and it's difficult to get a good history from him but she filled in the gaps. She states that he's had depression for at least one year. He started worrying about his job being discontinued or that [name] might close about a year ago. He finally decided to retire early in November. He also had some medical problems going on such as loss of vision and loss of hearing. In 2012 he had colon resection for diverticulosis.  Since retiring in November his mental status has declined. He started staying in bed all the time being unable to function not attending to his ADLs. He stopped eating and lost about 10 pounds. He was not interacting with anyone at all. His primary doctor had put him on Zoloft but it was not helpful. He had been on a low dose of Ativan for years as well. Finally in the end of May his wife brought him to [name] ED for evaluation and from there he was transferred to [facility] geropsychiatry unit.  While on the unit he was diagnosed with severe depression. He was started on a number of medicines including Paxil and Effexor, Seroquel trazodone and Depakote. He also had 4 treatments of ECT while in the hospital and to as an outpatient. His last one was in early June.  Since getting out of the hospital he seems to be doing somewhat better. He is getting out of bed every day and doing things around the house and yard. His family went on a trip to a [place] and he refused to go with them. States that he's had a lot of short-term memory loss since  having ECT but his wife thinks his mood is much better. He's never had psychotic symptoms such as auditory or visual hallucinations or paranoia but his thoughts were somewhat disorganized. He has never been suicidal or homicidal. He does not drink or use drugs. His wife thinks that he slowly getting better but he still not the person he used to be. He complains of dry mouth secondary to all the medications. His Depakote level has not been checked since he left the hospital  The patient returns after 3 months.  He is doing extremely well.  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  He is staying very busy and active.  His primary doctor recently had laboratories done and everything looked good.  His A1c is slightly higher at 6 but still within normal range.  He is going to start watching his sugary and fried foods.  His mood is excellent he is sleeping well and he has virtually no anxiety symptoms right now.  Visit Diagnosis:    ICD-10-CM   1. Bipolar I disorder, most recent episode mixed ([place]) F31.60     Past Psychiatric History: Supplies in 2015 with no prior psychiatric history  Past Medical History:  Past Medical History:  Diagnosis Date  . Anxiety   . Atherosclerosis of aorta ([place])   .  Atrial contractions, premature   . Depression   . Dyslipidemia   . Hyperlipidemia   . Mild hypertension   . Urinary hesitancy     Past Surgical History:  Procedure Laterality Date  . COLON RESECTION      Family Psychiatric History: See below  Family History:  Family History  Problem Relation Age of Onset  . Stroke Father   . Bipolar disorder Father   . Hypertension Sister   . Depression Sister   . COPD Mother     Social History:  Social History   Socioeconomic History  . Marital status: Married    Spouse name: Not on file  . Number of children: Not on file  . Years of education: Not on file  . Highest education level: Not  on file  Occupational History  . Not on file  Social Needs  . Financial resource strain: Not on file  . Food insecurity:    Worry: Not on file    Inability: Not on file  . Transportation needs:    Medical: Not on file    Non-medical: Not on file  Tobacco Use  . Smoking status: Former Smoker    Packs/day: 0.20    Years: 19.00    Pack years: 3.80    Types: Cigarettes  . Smokeless tobacco: Never Used  Substance and Sexual Activity  . Alcohol use: No    Alcohol/week: 0.0 oz  . Drug use: No  . Sexual activity: Not Currently  Lifestyle  . Physical activity:    Days per week: Not on file    Minutes per session: Not on file  . Stress: Not on file  Relationships  . Social connections:    Talks on phone: Not on file    Gets together: Not on file    Attends religious service: Not on file    Active member of club or organization: Not on file    Attends meetings of clubs or organizations: Not on file    Relationship status: Not on file  Other Topics Concern  . Not on file  Social History Narrative  . Not on file    Allergies: No Known Allergies  Metabolic Disorder Labs: No results found for: HGBA1C, MPG No results found for: PROLACTIN Lab Results  Component Value Date   CHOL 159 11/21/2017   TRIG 104 11/21/2017   HDL 51 11/21/2017   CHOLHDL 3.1 11/21/2017   LDLCALC 87 11/21/2017   LDLCALC 64 03/26/2017   Lab Results  Component Value Date   TSH 1.430 11/11/2015   TSH 0.797 05/02/2014    Therapeutic Level Labs: No results found for: LITHIUM Lab Results  Component Value Date   VALPROATE 41.8 (L) 11/29/2016   VALPROATE 44 (L) 11/11/2015   No components found for:  CBMZ  Current Medications: Current Outpatient Medications  Medication Sig Dispense Refill  . amLODipine (NORVASC) 5 MG tablet Take 1 tablet (5 mg total) by mouth daily. 90 tablet 3  . aspirin EC 81 MG tablet Take 1 tablet (81 mg total) by mouth daily. 90 tablet 3  . atorvastatin (LIPITOR) 80 MG  tablet TAKE 1 TABLET BY MOUTH DAILY. 180 tablet 0  . carbamide peroxide (DEBROX) 6.5 % otic solution Apply every 2 weeks just before your Saturday shower 15 mL 0  . Chlorphen-PE-Acetaminophen 4-10-325 MG TABS Take 1 tablet by mouth every 6 (six) hours as needed. 6 tablet 0  . divalproex (DEPAKOTE ER) 500 MG 24 hr tablet Take 1  tablet (500 mg total) by mouth at bedtime. 90 tablet 2  . LORazepam (ATIVAN) 0.5 MG tablet Take 1 tablet (0.5 mg total) by mouth 3 (three) times daily. 90 tablet 2  . PARoxetine (PAXIL) 20 MG tablet Take 1 tablet (20 mg total) by mouth at bedtime. 30 tablet 2  . QUEtiapine (SEROQUEL) 50 MG tablet Take 1 tablet (50 mg total) by mouth at bedtime. 90 tablet 2  . tamsulosin (FLOMAX) 0.4 MG CAPS capsule Take 1 capsule (0.4 mg total) by mouth 2 (two) times daily. 180 capsule 3  . traZODone (DESYREL) 50 MG tablet Take 1 tablet (50 mg total) by mouth at bedtime. 90 tablet 2  . venlafaxine XR (EFFEXOR-XR) 75 MG 24 hr capsule Take 1 capsule (75 mg total) by mouth daily with breakfast. 90 capsule 2   No current facility-administered medications for this visit.      Musculoskeletal: Strength & Muscle Tone: within normal limits Gait & Station: normal Patient leans: N/A  Psychiatric Specialty Exam: Review of Systems  All other systems reviewed and are negative.   Blood pressure (!) 144/82, pulse 86, height 5\' 10"  (1.778 m), weight 228 lb (103.4 kg), SpO2 93 %.Body mass index is 32.71 kg/m.  General Appearance: Casual, Neat and Well Groomed  Eye Contact:  Good  Speech:  Clear and Coherent  Volume:  Normal  Mood:  Euthymic  Affect:  Congruent  Thought Process:  Goal Directed  Orientation:  Full (Time, Place, and Person)  Thought Content: WDL   Suicidal Thoughts:  No  Homicidal Thoughts:  No  Memory:  Immediate;   Good Recent;   Good Remote;   Fair  Judgement:  Good  Insight:  Fair  Psychomotor Activity:  Normal  Concentration:  Concentration: Good and Attention Span:  Good  Recall:  Good  Fund of Knowledge: Good  Language: Good  Akathisia:  No  Handed:  Right  AIMS (if indicated): not done  Assets:  Communication Skills Desire for Improvement Physical Health Resilience Social Support Talents/Skills  ADL's:  Intact  Cognition: WNL  Sleep:  Good   Screenings: PHQ2-9     Office Visit from 11/21/2017 in [facility] Office Visit from 08/28/2017 in [facility] Office Visit from 07/25/2017 in [facility] Office Visit from 03/26/2017 in [facility] Office Visit from 12/14/2016 in [facility]  PHQ-2 Total Score  0  0  0  0  0       Assessment and Plan: This patient is a 62 year old male who went through a significant bout of depression in 2015 even necessitating ECT.  He is doing very well on his current regimen.  He will continue Depakote ER 500 mg at bedtime for mood stabilization, Ativan 0.5 mg 3 times daily for anxiety, Paxil 20 mg at bedtime for depression along with Effexor XR 75 mg each morning for depression, Seroquel 50 mg at bedtime for mood stabilization and trazodone 50 mg at bedtime for sleep.  He will return to see me in 3 months   Levonne Spiller, MD 11/27/2017, 11:28 AM

## 2017-12-19 MED FILL — PARoxetine HCL 20 MG TABS: 20 | 30 days supply | Qty: 30 | Fill #0

## 2017-12-19 MED FILL — QUETIAPINE FUMARATE 50 MG T: 50 | 90 days supply | Qty: 90 | Fill #0

## 2017-12-31 MED FILL — DIVALPROEX SOD ER 500 MG TA: 500 | 90 days supply | Qty: 90 | Fill #1

## 2017-12-31 MED FILL — VENLAFAXINE HCL ER 75 MG CA: 75 | 90 days supply | Qty: 90 | Fill #2

## 2017-12-31 MED FILL — AMLODIPINE BESYLATE 5 MG TA: 5 | 90 days supply | Qty: 90 | Fill #3

## 2017-12-31 MED FILL — LORazepam 0.5 MG TABS: 0.5 | 30 days supply | Qty: 90 | Fill #0

## 2018-01-21 ENCOUNTER — Other Ambulatory Visit: Payer: Self-pay | Admitting: Family Medicine

## 2018-01-21 MED FILL — traZODone HCL 50 MG TABS: 50 | 90 days supply | Qty: 90 | Fill #0

## 2018-01-21 MED FILL — PARoxetine HCL 20 MG TABS: 20 | 30 days supply | Qty: 30 | Fill #1

## 2018-01-22 MED FILL — ATORVASTATIN 80 MG TABLET: 80 | 90 days supply | Qty: 90 | Fill #0

## 2018-01-28 MED FILL — LORazepam 0.5 MG TABS: 0.5 | 30 days supply | Qty: 90 | Fill #1

## 2018-02-19 MED FILL — PARoxetine HCL 20 MG TABS: 20 | 30 days supply | Qty: 30 | Fill #2

## 2018-02-27 ENCOUNTER — Ambulatory Visit (INDEPENDENT_AMBULATORY_CARE_PROVIDER_SITE_OTHER): Payer: 59 | Admitting: Psychiatry

## 2018-02-27 ENCOUNTER — Ambulatory Visit (HOSPITAL_COMMUNITY): Payer: Self-pay | Admitting: Psychiatry

## 2018-02-27 ENCOUNTER — Encounter (HOSPITAL_COMMUNITY): Payer: Self-pay | Admitting: Psychiatry

## 2018-02-27 VITALS — BP 128/69 | HR 88 | Ht 70.0 in | Wt 227.0 lb

## 2018-02-27 DIAGNOSIS — F3160 Bipolar disorder, current episode mixed, unspecified: Secondary | ICD-10-CM

## 2018-02-27 DIAGNOSIS — F322 Major depressive disorder, single episode, severe without psychotic features: Secondary | ICD-10-CM | POA: Diagnosis not present

## 2018-02-27 MED ORDER — TRAZODONE HCL 50 MG PO TABS: 50.0000 mg | ORAL_TABLET | Freq: Every day | ORAL | 2 refills | Status: DC

## 2018-02-27 MED ORDER — DIVALPROEX SODIUM ER 500 MG PO TB24: 500.0000 mg | ORAL_TABLET | Freq: Every day | ORAL | 2 refills | Status: DC

## 2018-02-27 MED ORDER — QUETIAPINE FUMARATE 50 MG PO TABS: 50.0000 mg | ORAL_TABLET | Freq: Every day | ORAL | 2 refills | Status: DC

## 2018-02-27 MED ORDER — LORAZEPAM 0.5 MG PO TABS: 0.5000 mg | ORAL_TABLET | Freq: Three times a day (TID) | ORAL | 2 refills | Status: DC

## 2018-02-27 MED ORDER — VENLAFAXINE HCL ER 75 MG PO CP24: 75.0000 mg | ORAL_CAPSULE | Freq: Every day | ORAL | 2 refills | Status: DC

## 2018-02-27 MED ORDER — PAROXETINE HCL 20 MG PO TABS: 20.0000 mg | ORAL_TABLET | Freq: Every day | ORAL | 2 refills | Status: DC

## 2018-02-27 MED FILL — LORazepam 0.5 MG TABS: 0.5 | 30 days supply | Qty: 90 | Fill #0

## 2018-02-27 NOTE — Progress Notes (Signed)
[facility] MD/PA/NP OP Progress Note  02/27/2018 3:04 PM Lawrence Bennett  MRN:  [id number]  Chief Complaint:  Chief Complaint    Depression; Anxiety; Follow-up     HPI: This patient is a 62 year old married white male who lives with his wife and 3 children in [place]. 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  The patient presents with his wife today. He is very hard of hearing and it's difficult to get a good history from him but she filled in the gaps. She states that he's had depression for at least one year. He started worrying about his job being discontinued or that [name] might close about a year ago. He finally decided to retire early in November. He also had some medical problems going on such as loss of vision and loss of hearing. In 2012 he had colon resection for diverticulosis.  Since retiring in November his mental status has declined. He started staying in bed all the time being unable to function not attending to his ADLs. He stopped eating and lost about 10 pounds. He was not interacting with anyone at all. His primary doctor had put him on Zoloft but it was not helpful. He had been on a low dose of Ativan for years as well. Finally in the end of May his wife brought him to [name] ED for evaluation and from there he was transferred to [facility] geropsychiatry unit.  While on the unit he was diagnosed with severe depression. He was started on a number of medicines including Paxil and Effexor, Seroquel trazodone and Depakote. He also had 4 treatments of ECT while in the hospital and 2 as an outpatient. His last one was in early June.  Since getting out of the hospital he seems to be doing somewhat better. He is getting out of bed every day and doing things around the house and yard. His family went on a trip to a [place] and he refused to go with them. States that he's had a lot of short-term memory loss since  having ECT but his wife thinks his mood is much better. He's never had psychotic symptoms such as auditory or visual hallucinations or paranoia but his thoughts were somewhat disorganized. He has never been suicidal or homicidal. He does not drink or use drugs. His wife thinks that he slowly getting better but he still not the person he used to be. He complains of dry mouth secondary to all the medications. His Depakote level has not been checked since he left the hospital  The patient returns after 3 months.  He continues to do well.  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  This keeps him very busy.  He states he does not have enough time in a day to complete all of his projects.  His sleeping is good he is no longer depressed or anxious.  He feels like all of his medications have been helpful. Visit Diagnosis:    ICD-10-CM   1. Bipolar I disorder, most recent episode mixed ([place]) F31.60   2. Severe single current episode of major depressive disorder, without psychotic features ([place]) F32.2     Past Psychiatric History: Hospitalized in 2015 with no prior psychiatric history  Past Medical History:  Past Medical History:  Diagnosis Date  . Anxiety   . Atherosclerosis of aorta ([place])   . Atrial contractions, premature   .  Depression   . Dyslipidemia   . Hyperlipidemia   . Mild hypertension   . Urinary hesitancy     Past Surgical History:  Procedure Laterality Date  . COLON RESECTION      Family Psychiatric History: See below  Family History:  Family History  Problem Relation Age of Onset  . Stroke Father   . Bipolar disorder Father   . Hypertension Sister   . Depression Sister   . COPD Mother     Social History:  Social History   Socioeconomic History  . Marital status: Married    Spouse name: Not on file  . Number of children: Not on file  . Years of education: Not on file  . Highest education level: Not on file  Occupational History  . Not on  file  Social Needs  . Financial resource strain: Not on file  . Food insecurity:    Worry: Not on file    Inability: Not on file  . Transportation needs:    Medical: Not on file    Non-medical: Not on file  Tobacco Use  . Smoking status: Former Smoker    Packs/day: 0.20    Years: 19.00    Pack years: 3.80    Types: Cigarettes  . Smokeless tobacco: Never Used  Substance and Sexual Activity  . Alcohol use: No    Alcohol/week: 0.0 standard drinks  . Drug use: No  . Sexual activity: Not Currently  Lifestyle  . Physical activity:    Days per week: Not on file    Minutes per session: Not on file  . Stress: Not on file  Relationships  . Social connections:    Talks on phone: Not on file    Gets together: Not on file    Attends religious service: Not on file    Active member of club or organization: Not on file    Attends meetings of clubs or organizations: Not on file    Relationship status: Not on file  Other Topics Concern  . Not on file  Social History Narrative  . Not on file    Allergies: No Known Allergies  Metabolic Disorder Labs: No results found for: HGBA1C, MPG No results found for: PROLACTIN Lab Results  Component Value Date   CHOL 159 11/21/2017   TRIG 104 11/21/2017   HDL 51 11/21/2017   CHOLHDL 3.1 11/21/2017   LDLCALC 87 11/21/2017   LDLCALC 64 03/26/2017   Lab Results  Component Value Date   TSH 1.430 11/11/2015   TSH 0.797 05/02/2014    Therapeutic Level Labs: No results found for: LITHIUM Lab Results  Component Value Date   VALPROATE 41.8 (L) 11/29/2016   VALPROATE 44 (L) 11/11/2015   No components found for:  CBMZ  Current Medications: Current Outpatient Medications  Medication Sig Dispense Refill  . amLODipine (NORVASC) 5 MG tablet Take 1 tablet (5 mg total) by mouth daily. 90 tablet 3  . aspirin EC 81 MG tablet Take 1 tablet (81 mg total) by mouth daily. 90 tablet 3  . atorvastatin (LIPITOR) 80 MG tablet TAKE 1 TABLET BY MOUTH  DAILY. 180 tablet 0  . carbamide peroxide (DEBROX) 6.5 % otic solution Apply every 2 weeks just before your Saturday shower 15 mL 0  . Chlorphen-PE-Acetaminophen 4-10-325 MG TABS Take 1 tablet by mouth every 6 (six) hours as needed. 6 tablet 0  . divalproex (DEPAKOTE ER) 500 MG 24 hr tablet Take 1 tablet (500 mg total) by  mouth at bedtime. 90 tablet 2  . LORazepam (ATIVAN) 0.5 MG tablet Take 1 tablet (0.5 mg total) by mouth 3 (three) times daily. 90 tablet 2  . PARoxetine (PAXIL) 20 MG tablet Take 1 tablet (20 mg total) by mouth at bedtime. 30 tablet 2  . QUEtiapine (SEROQUEL) 50 MG tablet Take 1 tablet (50 mg total) by mouth at bedtime. 90 tablet 2  . tamsulosin (FLOMAX) 0.4 MG CAPS capsule Take 1 capsule (0.4 mg total) by mouth 2 (two) times daily. 180 capsule 3  . traZODone (DESYREL) 50 MG tablet Take 1 tablet (50 mg total) by mouth at bedtime. 90 tablet 2  . venlafaxine XR (EFFEXOR-XR) 75 MG 24 hr capsule Take 1 capsule (75 mg total) by mouth daily with breakfast. 90 capsule 2   No current facility-administered medications for this visit.      Musculoskeletal: Strength & Muscle Tone: within normal limits Gait & Station: normal Patient leans: N/A  Psychiatric Specialty Exam: Review of Systems  All other systems reviewed and are negative.   Blood pressure 128/69, pulse 88, height 5\' 10"  (1.778 m), weight 227 lb (103 kg), SpO2 95 %.Body mass index is 32.57 kg/m.  General Appearance: Casual, Neat and Well Groomed  Eye Contact:  Good  Speech:  Clear and Coherent  Volume:  Normal  Mood:  Euthymic  Affect:  Congruent  Thought Process:  Goal Directed  Orientation:  Full (Time, Place, and Person)  Thought Content: WDL   Suicidal Thoughts:  No  Homicidal Thoughts:  No  Memory:  Immediate;   Good Recent;   Good Remote;   Fair  Judgement:  Fair  Insight:  Fair  Psychomotor Activity:  Normal  Concentration:  Concentration: Good and Attention Span: Good  Recall:  Good  Fund of  Knowledge: Good  Language: Good  Akathisia:  No  Handed:  Right  AIMS (if indicated): not done  Assets:  Communication Skills Desire for Improvement Resilience Social Support Talents/Skills  ADL's:  Intact  Cognition: WNL  Sleep:  Good   Screenings: PHQ2-9     Office Visit from 11/21/2017 in [facility] Office Visit from 08/28/2017 in [facility] Office Visit from 07/25/2017 in [facility] Office Visit from 03/26/2017 in [facility] Office Visit from 12/14/2016 in [facility]  PHQ-2 Total Score  0  0  0  0  0       Assessment and Plan: This patient is a 62 year old male with a history of depression and anxiety.  He has been stable since he had ECT in 2015.  He will continue Effexor XR 75 mg every morning, Paxil 20 mg at bedtime for depression, Depakote ER 500 mg at bedtime and Seroquel 50 mg at bedtime for mood stabilization and Ativan 0.5 mg 3 times daily for anxiety and trazodone 50 mg at bedtime for sleep.  He will return to see me in 3 months   Levonne Spiller, MD 02/27/2018, 3:04 PM

## 2018-03-18 MED FILL — QUETIAPINE FUMARATE 50 MG T: 50 | 90 days supply | Qty: 90 | Fill #1

## 2018-03-18 MED FILL — PARoxetine HCL 20 MG TABS: 20 | 30 days supply | Qty: 30 | Fill #0

## 2018-03-24 ENCOUNTER — Other Ambulatory Visit: Payer: Self-pay | Admitting: Family Medicine

## 2018-03-24 MED ORDER — TAMSULOSIN HCL 0.4 MG PO CAPS: 0.4000 mg | ORAL_CAPSULE | Freq: Two times a day (BID) | ORAL | 0 refills | Status: DC

## 2018-03-24 MED ORDER — AMLODIPINE BESYLATE 5 MG PO TABS: 5.0000 mg | ORAL_TABLET | Freq: Every day | ORAL | 0 refills | Status: DC

## 2018-03-24 MED FILL — TAMSULOSIN HCL 0.4 MG CAP: 0.4 | 90 days supply | Qty: 180 | Fill #0

## 2018-03-24 MED FILL — AMLODIPINE BESYLATE 5 MG TA: 5 | 90 days supply | Qty: 90 | Fill #0

## 2018-03-24 NOTE — Telephone Encounter (Signed)
Appt 05/26/17

## 2018-03-24 NOTE — Telephone Encounter (Signed)
What is the name of the medication? Flomax 0.4 Amlodipine 5mg    Have you contacted your pharmacy to request a refill? Yes     Which pharmacy would you like this sent to? [facility] out patient [facility]    Patient notified that their request is being sent to the clinical staff for review and that they should receive a call once it is complete. If they do not receive a call within 24 hours they can check with their pharmacy or our office.

## 2018-03-27 MED FILL — LORazepam 0.5 MG TABS: 0.5 | 30 days supply | Qty: 90 | Fill #1

## 2018-04-08 MED FILL — VENLAFAXINE HCL ER 75 MG CA: 75 | 90 days supply | Qty: 90 | Fill #0

## 2018-04-08 MED FILL — DIVALPROEX SOD ER 500 MG TA: 500 | 90 days supply | Qty: 90 | Fill #2

## 2018-04-22 MED FILL — traZODone HCL 50 MG TABS: 50 | 90 days supply | Qty: 90 | Fill #1

## 2018-04-22 MED FILL — ATORVASTATIN 80 MG TABLET: 80 | 90 days supply | Qty: 90 | Fill #1

## 2018-04-22 MED FILL — PARoxetine HCL 20 MG TABS: 20 | 30 days supply | Qty: 30 | Fill #1

## 2018-04-23 ENCOUNTER — Ambulatory Visit: Payer: 59 | Admitting: Family Medicine

## 2018-04-23 VITALS — BP 153/97 | HR 78 | Temp 98.0°F | Ht 70.0 in | Wt 227.0 lb

## 2018-04-23 DIAGNOSIS — S0181XA Laceration without foreign body of other part of head, initial encounter: Secondary | ICD-10-CM | POA: Diagnosis not present

## 2018-04-23 DIAGNOSIS — Z23 Encounter for immunization: Secondary | ICD-10-CM

## 2018-04-23 NOTE — Progress Notes (Signed)
Subjective: CC: Chin laceration PCP: [name], DO Lawrence Bennett is a 62 y.o. male presenting to clinic today for:  1.  Chin laceration Patient reports that he sustained a laceration to the chin after having a very vivid dream and hitting his chin on the nightstand a couple of hours ago.  He reports some oozing.  He took his aspirin this morning.  He is not on any other blood thinners.  He also hit his right knee but states that it is okay.  Unknown when last tetanus shot was.   ROS: Per HPI  No Known Allergies Past Medical History:  Diagnosis Date  . Anxiety   . Atherosclerosis of aorta ([place])   . Atrial contractions, premature   . Depression   . Dyslipidemia   . Hyperlipidemia   . Mild hypertension   . Urinary hesitancy     Current Outpatient Medications:  .  amLODipine (NORVASC) 5 MG tablet, Take 1 tablet (5 mg total) by mouth daily., Disp: 90 tablet, Rfl: 0 .  aspirin EC 81 MG tablet, Take 1 tablet (81 mg total) by mouth daily., Disp: 90 tablet, Rfl: 3 .  atorvastatin (LIPITOR) 80 MG tablet, TAKE 1 TABLET BY MOUTH DAILY., Disp: 180 tablet, Rfl: 0 .  carbamide peroxide (DEBROX) 6.5 % otic solution, Apply every 2 weeks just before your Saturday shower, Disp: 15 mL, Rfl: 0 .  Chlorphen-PE-Acetaminophen 4-10-325 MG TABS, Take 1 tablet by mouth every 6 (six) hours as needed., Disp: 6 tablet, Rfl: 0 .  divalproex (DEPAKOTE ER) 500 MG 24 hr tablet, Take 1 tablet (500 mg total) by mouth at bedtime., Disp: 90 tablet, Rfl: 2 .  LORazepam (ATIVAN) 0.5 MG tablet, Take 1 tablet (0.5 mg total) by mouth 3 (three) times daily., Disp: 90 tablet, Rfl: 2 .  PARoxetine (PAXIL) 20 MG tablet, Take 1 tablet (20 mg total) by mouth at bedtime., Disp: 30 tablet, Rfl: 2 .  QUEtiapine (SEROQUEL) 50 MG tablet, Take 1 tablet (50 mg total) by mouth at bedtime., Disp: 90 tablet, Rfl: 2 .  tamsulosin (FLOMAX) 0.4 MG CAPS capsule, Take 1 capsule (0.4 mg total) by mouth 2 (two) times daily.,  Disp: 180 capsule, Rfl: 0 .  traZODone (DESYREL) 50 MG tablet, Take 1 tablet (50 mg total) by mouth at bedtime., Disp: 90 tablet, Rfl: 2 .  venlafaxine XR (EFFEXOR-XR) 75 MG 24 hr capsule, Take 1 capsule (75 mg total) by mouth daily with breakfast., Disp: 90 capsule, Rfl: 2 Social History   Socioeconomic History  . Marital status: Married    Spouse name: Not on file  . Number of children: Not on file  . Years of education: Not on file  . Highest education level: Not on file  Occupational History  . Not on file  Social Needs  . Financial resource strain: Not on file  . Food insecurity:    Worry: Not on file    Inability: Not on file  . Transportation needs:    Medical: Not on file    Non-medical: Not on file  Tobacco Use  . Smoking status: Former Smoker    Packs/day: 0.20    Years: 19.00    Pack years: 3.80    Types: Cigarettes  . Smokeless tobacco: Never Used  Substance and Sexual Activity  . Alcohol use: No    Alcohol/week: 0.0 standard drinks  . Drug use: No  . Sexual activity: Not Currently  Lifestyle  . Physical activity:  Days per week: Not on file    Minutes per session: Not on file  . Stress: Not on file  Relationships  . Social connections:    Talks on phone: Not on file    Gets together: Not on file    Attends religious service: Not on file    Active member of club or organization: Not on file    Attends meetings of clubs or organizations: Not on file    Relationship status: Not on file  . Intimate partner violence:    Fear of current or ex partner: Not on file    Emotionally abused: Not on file    Physically abused: Not on file    Forced sexual activity: Not on file  Other Topics Concern  . Not on file  Social History Narrative  . Not on file   Family History  Problem Relation Age of Onset  . Stroke Father   . Bipolar disorder Father   . Hypertension Sister   . Depression Sister   . COPD Mother     Objective: Office vital signs  reviewed. BP (!) 153/97   Pulse 78   Temp 98 F (36.7 C) (Oral)   Ht 5\' 10"  (1.778 m)   Wt 227 lb (103 kg)   BMI 32.57 kg/m   Physical Examination:  General: Awake, alert, well nourished, No acute distress Skin: Shallow laceration approximately 0.6 cm in length appreciated on the left inferior aspect of the chin.  Slow oozing appreciated.  No foreign bodies noted.  Procedure:  Laceration repair: chin Informed consent provided.  Written consent obtained.  This is been scanned in the chart.  ~ 0.6 cm laceration along the left inferior chin noted. 1 cc of lidocaine without epinephrine was used to obtain local anesthesia.  Area was evaluated for foreign bodies. 3 interrupted sutures using 5-0 Vicryl was used to close wound.  Hemostasis was achieved w/ pressure and silver nitrate stick x1.  Home care instructions reviewed and handout was provided.  Patient to follow-up in 5 to 7 days for suture removal.  Assessment/ Plan: 62 y.o. male   1. Chin laceration, initial encounter Repaired using interrupted sutures as above x3.  Home care instructions were reviewed.  He did have slight oozing after moving his jaw around.  I suspect this is related to use of aspirin.  A bandage was applied and continued pressure was recommended.  He will follow-up in 5 to 7 days for suture removal.  2. Need for tetanus booster Tetanus shot updated.   [name], DO [place] [phone]

## 2018-04-23 NOTE — Addendum Note (Signed)
Addended byRoderic Ovens, Jerrin Recore C on: 04/23/2018 05:00 PM   Modules accepted: Orders

## 2018-04-23 NOTE — Patient Instructions (Signed)
Suture Aftercare  What are sutures?  Your wound required sutures (stiches) today.  These will remain in the skin for 5-7 days.  Return to clinic in 5-7 days to have the sutures removed.  How do I take care of my incision?   Keep the wound and sutures dry for 24 hours, unless you are told otherwise.   After 24 hours, wash the area daily with mild soap and warm water, unless instructed otherwise. DO NOT rub the wound.  Avoid soaking the area while sutures are in place (this means no tub baths, swimming or submerging the wound under water until the sutures are removed).  You may shower after 24 hours.  Inspect incisions daily for signs of infection.   You may apply a clean dressing and medication (Vaseline or plain Bacitracin are good. NO Neosporin) to the wound.   Be sure to ask any questions about your care so that you can perform these dressings at home.   What are my post-operative instructions?   If antibiotics are prescribed, take your full prescription as directed.   Pain is usually relieved with Tylenol or Ibuprofen. If a stronger medication is prescribed, follow directions carefully.   Do not drink alcohol when taking pain medications.  Stopping smoking will improve healing.   Swimming may be resumed after the sutures are removed.   When should I call my doctor?   If you have increased swelling or bruising.   If swelling and redness does not get better for a few days.   If you have increased redness along the incision.   If you have severe or increased pain that is not relieved by medication.  If you have any side effects to medications like: rash, nausea, headache, or vomiting.   If you have an oral temperature over 100.72F.  If you have any pus (yellow or green drainage) from the incisions or notice a foul odor.   If you have bleeding from the incisions that is difficult to control with light pressure.   If you have loss of feeling or motion.   You may  call our office at [phone].  If we are closed, you can still call our office, and the on-call provider will contact you with instructions.

## 2018-04-29 MED FILL — LORazepam 0.5 MG TABS: 0.5 | 30 days supply | Qty: 90 | Fill #2

## 2018-05-20 MED FILL — PARoxetine HCL 20 MG TABS: 20 | 30 days supply | Qty: 30 | Fill #2

## 2018-05-22 DIAGNOSIS — H2513 Age-related nuclear cataract, bilateral: Secondary | ICD-10-CM | POA: Diagnosis not present

## 2018-05-22 DIAGNOSIS — H524 Presbyopia: Secondary | ICD-10-CM | POA: Diagnosis not present

## 2018-05-22 DIAGNOSIS — H52223 Regular astigmatism, bilateral: Secondary | ICD-10-CM | POA: Diagnosis not present

## 2018-05-22 DIAGNOSIS — H5213 Myopia, bilateral: Secondary | ICD-10-CM | POA: Diagnosis not present

## 2018-05-26 ENCOUNTER — Encounter: Payer: Self-pay | Admitting: Family Medicine

## 2018-05-26 ENCOUNTER — Ambulatory Visit: Payer: 59 | Admitting: Family Medicine

## 2018-05-26 VITALS — BP 139/91 | HR 86 | Temp 97.4°F | Ht 70.0 in | Wt 231.0 lb

## 2018-05-26 DIAGNOSIS — E785 Hyperlipidemia, unspecified: Secondary | ICD-10-CM | POA: Diagnosis not present

## 2018-05-26 DIAGNOSIS — I10 Essential (primary) hypertension: Secondary | ICD-10-CM

## 2018-05-26 DIAGNOSIS — Z23 Encounter for immunization: Secondary | ICD-10-CM

## 2018-05-26 DIAGNOSIS — R7303 Prediabetes: Secondary | ICD-10-CM

## 2018-05-26 DIAGNOSIS — H6123 Impacted cerumen, bilateral: Secondary | ICD-10-CM

## 2018-05-26 LAB — BAYER DCA HB A1C WAIVED: HB A1C (BAYER DCA - WAIVED): 6 % (ref <7.0)

## 2018-05-26 MED ORDER — HYDROCHLOROTHIAZIDE 25 MG PO TABS: 12.5000 mg | ORAL_TABLET | Freq: Every day | ORAL | 0 refills | Status: DC

## 2018-05-26 MED FILL — HYDROCHLOROTHIAZIDE 25 MG T: 25 | 90 days supply | Qty: 90 | Fill #0

## 2018-05-26 NOTE — Progress Notes (Signed)
Subjective: CC: Pre-DM, HTN, HLD PCP: [name], DO [facility]:QPRFFM H Choyce is a 62 y.o. male presenting to clinic today for:  1. Pre-DM/hypertension/hyperlipidemia Last A1c was 6.0.  Patient with history of stroke but states that he did not even know he had a stroke.  It was found incidentally.  He denies any residual deficits.  He reports compliance with Norvasc 5 mg daily, Lipitor 80 mg nightly and daily aspirin.  He states that the Norvasc was increased at one point to 10 mg but he felt like he was going to have a syncopal episode and therefore it was reduced back to 5 mg.  He does report quite a bit of sugar intake, citing that sweets are his device.  He is a former smoker that quit many years ago.  2. Difficulty hearing Patient reports long-standing history of difficulty with hearing.  He has been seen by audiology in the past.  He has thought about getting hearing aids but states that they cost quite a bit of money he is unsure if the insurance will help.  He would like to have his ears checked today for earwax.   ROS: Per HPI  No Known Allergies Past Medical History:  Diagnosis Date  . Anxiety   . Atherosclerosis of aorta ([place])   . Atrial contractions, premature   . Depression   . Dyslipidemia   . Elevated glucose 11/11/2015  . Hyperlipidemia   . Mild hypertension   . Urinary hesitancy     Current Outpatient Medications:  .  amLODipine (NORVASC) 5 MG tablet, Take 1 tablet (5 mg total) by mouth daily., Disp: 90 tablet, Rfl: 0 .  aspirin EC 81 MG tablet, Take 1 tablet (81 mg total) by mouth daily., Disp: 90 tablet, Rfl: 3 .  atorvastatin (LIPITOR) 80 MG tablet, TAKE 1 TABLET BY MOUTH DAILY., Disp: 180 tablet, Rfl: 0 .  carbamide peroxide (DEBROX) 6.5 % otic solution, Apply every 2 weeks just before your Saturday shower, Disp: 15 mL, Rfl: 0 .  Chlorphen-PE-Acetaminophen 4-10-325 MG TABS, Take 1 tablet by mouth every 6 (six) hours as needed., Disp: 6 tablet, Rfl: 0 .   divalproex (DEPAKOTE ER) 500 MG 24 hr tablet, Take 1 tablet (500 mg total) by mouth at bedtime., Disp: 90 tablet, Rfl: 2 .  LORazepam (ATIVAN) 0.5 MG tablet, Take 1 tablet (0.5 mg total) by mouth 3 (three) times daily., Disp: 90 tablet, Rfl: 2 .  PARoxetine (PAXIL) 20 MG tablet, Take 1 tablet (20 mg total) by mouth at bedtime., Disp: 30 tablet, Rfl: 2 .  QUEtiapine (SEROQUEL) 50 MG tablet, Take 1 tablet (50 mg total) by mouth at bedtime., Disp: 90 tablet, Rfl: 2 .  tamsulosin (FLOMAX) 0.4 MG CAPS capsule, Take 1 capsule (0.4 mg total) by mouth 2 (two) times daily., Disp: 180 capsule, Rfl: 0 .  traZODone (DESYREL) 50 MG tablet, Take 1 tablet (50 mg total) by mouth at bedtime., Disp: 90 tablet, Rfl: 2 .  venlafaxine XR (EFFEXOR-XR) 75 MG 24 hr capsule, Take 1 capsule (75 mg total) by mouth daily with breakfast., Disp: 90 capsule, Rfl: 2 Social History   Socioeconomic History  . Marital status: Married    Spouse name: Not on file  . Number of children: Not on file  . Years of education: Not on file  . Highest education level: Not on file  Occupational History  . Not on file  Social Needs  . Financial resource strain: Not on file  .  Food insecurity:    Worry: Not on file    Inability: Not on file  . Transportation needs:    Medical: Not on file    Non-medical: Not on file  Tobacco Use  . Smoking status: Former Smoker    Packs/day: 0.20    Years: 19.00    Pack years: 3.80    Types: Cigarettes  . Smokeless tobacco: Never Used  Substance and Sexual Activity  . Alcohol use: No    Alcohol/week: 0.0 standard drinks  . Drug use: No  . Sexual activity: Not Currently  Lifestyle  . Physical activity:    Days per week: Not on file    Minutes per session: Not on file  . Stress: Not on file  Relationships  . Social connections:    Talks on phone: Not on file    Gets together: Not on file    Attends religious service: Not on file    Active member of club or organization: Not on file     Attends meetings of clubs or organizations: Not on file    Relationship status: Not on file  . Intimate partner violence:    Fear of current or ex partner: Not on file    Emotionally abused: Not on file    Physically abused: Not on file    Forced sexual activity: Not on file  Other Topics Concern  . Not on file  Social History Narrative  . Not on file   Family History  Problem Relation Age of Onset  . Stroke Father   . Bipolar disorder Father   . Hypertension Sister   . Depression Sister   . COPD Mother     Objective: Office vital signs reviewed. BP (!) 139/91   Pulse 86   Temp (!) 97.4 F (36.3 C) (Oral)   Ht 5\' 10"  (1.778 m)   Wt 231 lb (104.8 kg)   BMI 33.15 kg/m   Physical Examination:  General: Awake, alert, well nourished, No acute distress HEENT: Normal    Ears: bilateral TM occluded by dry cerumen Cardio: regular rate and rhythm, S1S2 heard, no murmurs appreciated Pulm: clear to auscultation bilaterally, no wheezes, rhonchi or rales; normal work of breathing on room air Extremities: Warm, well-perfused, no edema. Neuro: hard of hearing.  Assessment/ Plan: 62 y.o. male   1. Pre-diabetes A1c is stable at 6.0.  We discussed reduction in carbohydrate intake.  We had a nice conversation about what types of foods contain carbohydrates.  Patient seemed to be surprised by many of these foods.  He will work on diet modification and follow-up in 3 months for blood pressure recheck as below - Bayer DCA Hb A1c Waived  2. Essential hypertension Not at goal.  We discussed that goal is less than 140/90, particularly given history of stroke.  Because he is not tolerating increase in Norvasc in the past, I have added 12.5 mg of hydrochlorothiazide daily.  He will monitor blood pressures daily and return in 2 weeks for formal blood pressure check with nurse.  If still elevated, I will have him go up to 25 mg daily.  3. Hyperlipidemia, unspecified hyperlipidemia type Continue  atorvastatin.  4. Bilateral impacted cerumen Irrigation unsuccessful.  Recommended use of Debrox.  May return for irrigation in a couple of weeks if needed.   Orders Placed This Encounter  Procedures  . Bayer DCA Hb A1c Waived   Meds ordered this encounter  Medications  . hydrochlorothiazide (HYDRODIURIL) 25 MG tablet  Sig: Take 0.5-1 tablets (12.5-25 mg total) by mouth daily.    Dispense:  90 tablet    Refill:  [place], DO [place] [phone]

## 2018-05-26 NOTE — Addendum Note (Signed)
Addended byCarrolyn Leigh on: 05/26/2018 01:19 PM   Modules accepted: Orders

## 2018-05-26 NOTE — Patient Instructions (Addendum)
Your A1c was 6.0 today.  This is PRE Diabetes.  I want you to work on reduction of sugar and carbohydrates (rice, bread, potatoes, pasta, etc).  Your risk for strokes and heart attacks go up with diagnoses like high blood pressure, cholesterol and blood sugar.  Your blood pressure is too high.  Since the 10mg  of amlodipine caused you to be dizzy before, I have instead added a medicaiton called Hydrochlorothiazide.  It is a water pill and should be taken in the morning, as it will cause increased urination.    Start with 1/2 tablet daily.  Take this with the Amlodipine.  Check your blood pressure daily and record.  Your goal is less than 140/90.  Return in 2 weeks to have this checked formally with the nurse.  If it is still too high, we will plan to have you increase the water pill to 1 full tablet.   How to Take Your Blood Pressure You can take your blood pressure at home with a machine. You may need to check your blood pressure at home:  To check if you have high blood pressure (hypertension).  To check your blood pressure over time.  To make sure your blood pressure medicine is working.  Supplies needed: You will need a blood pressure machine, or monitor. You can buy one at a drugstore or online. When choosing one:  Choose one with an arm cuff.  Choose one that wraps around your upper arm. Only one finger should fit between your arm and the cuff.  Do not choose one that measures your blood pressure from your wrist or finger.  Your doctor can suggest a monitor. How to prepare Avoid these things for 30 minutes before checking your blood pressure:  Drinking caffeine.  Drinking alcohol.  Eating.  Smoking.  Exercising.  Five minutes before checking your blood pressure:  Pee.  Sit in a dining chair. Avoid sitting in a soft couch or armchair.  Be quiet. Do not talk.  How to take your blood pressure Follow the instructions that came with your machine. If you have a digital  blood pressure monitor, these may be the instructions: 1. Sit up straight. 2. Place your feet on the floor. Do not cross your ankles or legs. 3. Rest your left arm at the level of your heart. You may rest it on a table, desk, or chair. 4. Pull up your shirt sleeve. 5. Wrap the blood pressure cuff around the upper part of your left arm. The cuff should be 1 inch (2.5 cm) above your elbow. It is best to wrap the cuff around bare skin. 6. Fit the cuff snugly around your arm. You should be able to place only one finger between the cuff and your arm. 7. Put the cord inside the groove of your elbow. 8. Press the power button. 9. Sit quietly while the cuff fills with air and loses air. 10. Write down the numbers on the screen. 11. Wait 2-3 minutes and then repeat steps 1-10.  What do the numbers mean? Two numbers make up your blood pressure. The first number is called systolic pressure. The second is called diastolic pressure. An example of a blood pressure reading is "120 over 80" (or 120/80). If you are an adult and do not have a medical condition, use this guide to find out if your blood pressure is normal: Normal  First number: below 120.  Second number: below 80. Elevated  First number: 120-129.  Second number:  below 80. Hypertension stage 1  First number: 130-139.  Second number: 80-89. Hypertension stage 2  First number: 140 or above.  Second number: 73 or above. Your blood pressure is above normal even if only the top or bottom number is above normal. Follow these instructions at home:  Check your blood pressure as often as your doctor tells you to.  Take your monitor to your next doctor's appointment. Your doctor will: ? Make sure you are using it correctly. ? Make sure it is working right.  Make sure you understand what your blood pressure numbers should be.  Tell your doctor if your medicines are causing side effects. Contact a doctor if:  Your blood pressure  keeps being high. Get help right away if:  Your first blood pressure number is higher than 180.  Your second blood pressure number is higher than 120. This information is not intended to replace advice given to you by your health care provider. Make sure you discuss any questions you have with your health care provider. Document Released: 05/31/2008 Document Revised: 05/16/2016 Document Reviewed: 11/25/2015 Elsevier Interactive Patient Education  [employer].

## 2018-06-02 ENCOUNTER — Ambulatory Visit (HOSPITAL_COMMUNITY): Payer: 59 | Admitting: Psychiatry

## 2018-06-02 ENCOUNTER — Encounter (HOSPITAL_COMMUNITY): Payer: Self-pay | Admitting: Psychiatry

## 2018-06-02 VITALS — BP 125/79 | HR 95 | Ht 70.0 in | Wt 228.0 lb

## 2018-06-02 DIAGNOSIS — F3160 Bipolar disorder, current episode mixed, unspecified: Secondary | ICD-10-CM | POA: Diagnosis not present

## 2018-06-02 MED ORDER — LORAZEPAM 0.5 MG PO TABS: 0.5000 mg | ORAL_TABLET | Freq: Three times a day (TID) | ORAL | 3 refills | Status: DC

## 2018-06-02 MED ORDER — PAROXETINE HCL 20 MG PO TABS: 20.0000 mg | ORAL_TABLET | Freq: Every day | ORAL | 2 refills | Status: DC

## 2018-06-02 MED ORDER — VENLAFAXINE HCL ER 75 MG PO CP24: 75.0000 mg | ORAL_CAPSULE | Freq: Every day | ORAL | 2 refills | Status: DC

## 2018-06-02 MED ORDER — TRAZODONE HCL 50 MG PO TABS: 50.0000 mg | ORAL_TABLET | Freq: Every day | ORAL | 2 refills | Status: DC

## 2018-06-02 MED ORDER — DIVALPROEX SODIUM ER 500 MG PO TB24: 500.0000 mg | ORAL_TABLET | Freq: Every day | ORAL | 2 refills | Status: DC

## 2018-06-02 MED ORDER — QUETIAPINE FUMARATE 50 MG PO TABS: 50.0000 mg | ORAL_TABLET | Freq: Every day | ORAL | 2 refills | Status: DC

## 2018-06-02 MED FILL — LORazepam 0.5 MG TABS: 0.5 | 30 days supply | Qty: 90 | Fill #0

## 2018-06-02 NOTE — Progress Notes (Signed)
[place] MD/PA/NP OP Progress Note  06/02/2018 2:09 PM Lawrence Bennett  MRN:  [id number]  Chief Complaint:  Chief Complaint    Depression; Anxiety; Follow-up     HPI: This patient is a 62 year old married white male who lives with his wife and 3 children in [place]. 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  The patient presents with his wife today. He is very hard of hearing and it's difficult to get a good history from him but she filled in the gaps. She states that he's had depression for at least one year. He started worrying about his job being discontinued or that [name] might close about a year ago. He finally decided to retire early in November. He also had some medical problems going on such as loss of vision and loss of hearing. In 2012 he had colon resection for diverticulosis.  Since retiring in November his mental status has declined. He started staying in bed all the time being unable to function not attending to his ADLs. He stopped eating and lost about 10 pounds. He was not interacting with anyone at all. His primary doctor had put him on Zoloft but it was not helpful. He had been on a low dose of Ativan for years as well. Finally in the end of May his wife brought him to [name] ED for evaluation and from there he was transferred to [facility] geropsychiatry unit.  While on the unit he was diagnosed with severe depression. He was started on a number of medicines including Paxil and Effexor, Seroquel trazodone and Depakote. He also had 4 treatments of ECT while in the hospital and 2 as an outpatient. His last one was in early June.  Since getting out of the hospital he seems to be doing somewhat better. He is getting out of bed every day and doing things around the house and yard. His family went on a trip to a [place] and he refused to go with them. States that he's had a lot of short-term memory loss since  having ECT but his wife thinks his mood is much better. He's never had psychotic symptoms such as auditory or visual hallucinations or paranoia but his thoughts were somewhat disorganized. He has never been suicidal or homicidal. He does not drink or use drugs. His wife thinks that he slowly getting better but he still not the person he used to be. He complains of dry mouth secondary to all the medications. His Depakote level has not been checked since he left the hospital  The patient returns after 3 months.  He states that he is doing very well.  However he claims that his medications give him very vivid dreams.  A few weeks ago he dreamed he was having to jump off a truck and he jumped off the bed in his sleep and gash to his chin requiring stitches.  I am not sure what caused this but I am suspicious of the trazodone.  I suggested that we cut it back but he is unwilling to do so right now.  One night he missed his medicines and he did not sleep at all.  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  His mood is been good in general he sleeps well.  He would like to stay on the same medicines and if he has trouble with vivid dreams again  he will call me Visit Diagnosis:    ICD-10-CM   1. Bipolar I disorder, most recent episode mixed ([place]) F31.60     Past Psychiatric History: Hospitalized in 2015 with no prior psychiatric history Past Medical History:  Past Medical History:  Diagnosis Date  . Anxiety   . Atherosclerosis of aorta ([place])   . Atrial contractions, premature   . Depression   . Dyslipidemia   . Elevated glucose 11/11/2015  . Hyperlipidemia   . Mild hypertension   . Urinary hesitancy     Past Surgical History:  Procedure Laterality Date  . COLON RESECTION      Family Psychiatric History: See below  Family History:  Family History  Problem Relation Age of Onset  . Stroke Father   . Bipolar disorder Father   . Hypertension Sister   .  Depression Sister   . COPD Mother     Social History:  Social History   Socioeconomic History  . Marital status: Married    Spouse name: Not on file  . Number of children: Not on file  . Years of education: Not on file  . Highest education level: Not on file  Occupational History  . Not on file  Social Needs  . Financial resource strain: Not on file  . Food insecurity:    Worry: Not on file    Inability: Not on file  . Transportation needs:    Medical: Not on file    Non-medical: Not on file  Tobacco Use  . Smoking status: Former Smoker    Packs/day: 0.20    Years: 19.00    Pack years: 3.80    Types: Cigarettes  . Smokeless tobacco: Never Used  Substance and Sexual Activity  . Alcohol use: No    Alcohol/week: 0.0 standard drinks  . Drug use: No  . Sexual activity: Not Currently  Lifestyle  . Physical activity:    Days per week: Not on file    Minutes per session: Not on file  . Stress: Not on file  Relationships  . Social connections:    Talks on phone: Not on file    Gets together: Not on file    Attends religious service: Not on file    Active member of club or organization: Not on file    Attends meetings of clubs or organizations: Not on file    Relationship status: Not on file  Other Topics Concern  . Not on file  Social History Narrative  . Not on file    Allergies: No Known Allergies  Metabolic Disorder Labs: No results found for: HGBA1C, MPG No results found for: PROLACTIN Lab Results  Component Value Date   CHOL 159 11/21/2017   TRIG 104 11/21/2017   HDL 51 11/21/2017   CHOLHDL 3.1 11/21/2017   LDLCALC 87 11/21/2017   LDLCALC 64 03/26/2017   Lab Results  Component Value Date   TSH 1.430 11/11/2015   TSH 0.797 05/02/2014    Therapeutic Level Labs: No results found for: LITHIUM Lab Results  Component Value Date   VALPROATE 41.8 (L) 11/29/2016   VALPROATE 44 (L) 11/11/2015   No components found for:  CBMZ  Current  Medications: Current Outpatient Medications  Medication Sig Dispense Refill  . amLODipine (NORVASC) 5 MG tablet Take 1 tablet (5 mg total) by mouth daily. 90 tablet 0  . aspirin EC 81 MG tablet Take 1 tablet (81 mg total) by mouth daily. 90 tablet 3  . atorvastatin (  LIPITOR) 80 MG tablet TAKE 1 TABLET BY MOUTH DAILY. 180 tablet 0  . carbamide peroxide (DEBROX) 6.5 % otic solution Apply every 2 weeks just before your Saturday shower 15 mL 0  . Chlorphen-PE-Acetaminophen 4-10-325 MG TABS Take 1 tablet by mouth every 6 (six) hours as needed. 6 tablet 0  . divalproex (DEPAKOTE ER) 500 MG 24 hr tablet Take 1 tablet (500 mg total) by mouth at bedtime. 90 tablet 2  . hydrochlorothiazide (HYDRODIURIL) 25 MG tablet Take 0.5-1 tablets (12.5-25 mg total) by mouth daily. 90 tablet 0  . LORazepam (ATIVAN) 0.5 MG tablet Take 1 tablet (0.5 mg total) by mouth 3 (three) times daily. 90 tablet 3  . PARoxetine (PAXIL) 20 MG tablet Take 1 tablet (20 mg total) by mouth at bedtime. 30 tablet 2  . QUEtiapine (SEROQUEL) 50 MG tablet Take 1 tablet (50 mg total) by mouth at bedtime. 90 tablet 2  . tamsulosin (FLOMAX) 0.4 MG CAPS capsule Take 1 capsule (0.4 mg total) by mouth 2 (two) times daily. 180 capsule 0  . traZODone (DESYREL) 50 MG tablet Take 1 tablet (50 mg total) by mouth at bedtime. 90 tablet 2  . venlafaxine XR (EFFEXOR-XR) 75 MG 24 hr capsule Take 1 capsule (75 mg total) by mouth daily with breakfast. 90 capsule 2   No current facility-administered medications for this visit.      Musculoskeletal: Strength & Muscle Tone: within normal limits Gait & Station: normal Patient leans: N/A  Psychiatric Specialty Exam: Review of Systems  HENT: Positive for hearing loss.   All other systems reviewed and are negative.   Blood pressure 125/79, pulse 95, height 5\' 10"  (1.778 m), weight 228 lb (103.4 kg), SpO2 97 %.Body mass index is 32.71 kg/m.  General Appearance: Casual and Fairly Groomed  Eye Contact:   Good  Speech:  Clear and Coherent  Volume:  Normal  Mood:  Euthymic  Affect:  Congruent  Thought Process:  Goal Directed  Orientation:  Full (Time, Place, and Person)  Thought Content: WDL   Suicidal Thoughts:  No  Homicidal Thoughts:  No  Memory:  Immediate;   Good Recent;   Good Remote;   Fair  Judgement:  Fair  Insight:  Fair  Psychomotor Activity:  Normal  Concentration:  Concentration: Good and Attention Span: Good  Recall:  Good  Fund of Knowledge: Good  Language: Good  Akathisia:  No  Handed:  Right  AIMS (if indicated): not done  Assets:  Communication Skills Desire for Improvement Physical Health Resilience Social Support Talents/Skills  ADL's:  Intact  Cognition: WNL  Sleep:  Good   Screenings: GAD-7     Office Visit from 05/26/2018 in [facility]  Total GAD-7 Score  3    PHQ2-9     Office Visit from 05/26/2018 in [facility] Office Visit from 04/23/2018 in [facility] Office Visit from 11/21/2017 in [facility] Office Visit from 08/28/2017 in [facility] Office Visit from 07/25/2017 in [place] [facility]  PHQ-2 Total Score  0  0  0  0  0  PHQ-9 Total Score  0  -  -  -  -       Assessment and Plan: This patient is a 62 year old male with a history of severe depression and anxiety.  He is doing well on his current medication other than the vivid dreams.  He does not want to change anything at present.  He will continue Effexor XR 75 mg every morning for depression along with Paxil 20 mill grams at bedtime for depression, Seroquel 50 mill grams at bedtime for mood stabilization, Depakote ER 500 mg at bedtime for mood stabilization and lorazepam 0.5 mg 3 times daily for anxiety.  He will return to see me in 4 months   Levonne Spiller, MD 06/02/2018, 2:09 PM

## 2018-06-03 MED FILL — QUETIAPINE FUMARATE 50 MG T: 50 | 90 days supply | Qty: 90 | Fill #2

## 2018-06-09 ENCOUNTER — Ambulatory Visit: Payer: 59 | Admitting: *Deleted

## 2018-06-09 VITALS — BP 126/83 | HR 79

## 2018-06-09 DIAGNOSIS — Z0130 Encounter for examination of blood pressure without abnormal findings: Secondary | ICD-10-CM

## 2018-06-09 NOTE — Progress Notes (Signed)
Excellent.  No continue current regimen.

## 2018-06-09 NOTE — Progress Notes (Signed)
Pt here for BP check BP 126 83 P 79

## 2018-06-18 MED FILL — PARoxetine HCL 20 MG TABS: 20 | 30 days supply | Qty: 30 | Fill #0

## 2018-07-03 ENCOUNTER — Other Ambulatory Visit: Payer: Self-pay | Admitting: Family Medicine

## 2018-07-03 MED FILL — DIVALPROEX SOD ER 500 MG TA: 500 | 90 days supply | Qty: 90 | Fill #0

## 2018-07-03 MED FILL — VENLAFAXINE HCL ER 75 MG CA: 75 | 90 days supply | Qty: 90 | Fill #1

## 2018-07-03 MED FILL — AMLODIPINE BESYLATE 5 MG TA: 5 | 90 days supply | Qty: 90 | Fill #0

## 2018-07-03 MED FILL — LORazepam 0.5 MG TABS: 0.5 | 30 days supply | Qty: 90 | Fill #1

## 2018-07-18 MED FILL — PARoxetine HCL 20 MG TABS: 20 | 30 days supply | Qty: 30 | Fill #1

## 2018-07-25 ENCOUNTER — Other Ambulatory Visit: Payer: Self-pay | Admitting: *Deleted

## 2018-07-25 MED ORDER — ATORVASTATIN CALCIUM 80 MG PO TABS: 80.0000 mg | ORAL_TABLET | Freq: Every day | ORAL | 0 refills | Status: DC

## 2018-07-25 MED FILL — ATORVASTATIN 80 MG TABLET: 80 | 90 days supply | Qty: 90 | Fill #0

## 2018-08-05 MED FILL — LORazepam 0.5 MG TABS: 0.5 | 30 days supply | Qty: 90 | Fill #2

## 2018-08-06 MED FILL — traZODone HCL 50 MG TABS: 50 | 90 days supply | Qty: 90 | Fill #0 | Status: TO

## 2018-08-20 MED FILL — PARoxetine HCL 20 MG TABS: 20 | 30 days supply | Qty: 30 | Fill #2

## 2018-08-26 ENCOUNTER — Ambulatory Visit: Payer: 59 | Admitting: Family Medicine

## 2018-08-26 ENCOUNTER — Encounter: Payer: Self-pay | Admitting: Family Medicine

## 2018-08-26 VITALS — BP 130/81 | HR 87 | Temp 97.9°F | Ht 70.0 in | Wt 244.0 lb

## 2018-08-26 DIAGNOSIS — I10 Essential (primary) hypertension: Secondary | ICD-10-CM | POA: Diagnosis not present

## 2018-08-26 DIAGNOSIS — N401 Enlarged prostate with lower urinary tract symptoms: Secondary | ICD-10-CM

## 2018-08-26 DIAGNOSIS — F419 Anxiety disorder, unspecified: Secondary | ICD-10-CM | POA: Diagnosis not present

## 2018-08-26 DIAGNOSIS — Z5181 Encounter for therapeutic drug level monitoring: Secondary | ICD-10-CM | POA: Diagnosis not present

## 2018-08-26 DIAGNOSIS — R3912 Poor urinary stream: Secondary | ICD-10-CM

## 2018-08-26 DIAGNOSIS — F331 Major depressive disorder, recurrent, moderate: Secondary | ICD-10-CM | POA: Diagnosis not present

## 2018-08-26 DIAGNOSIS — R635 Abnormal weight gain: Secondary | ICD-10-CM

## 2018-08-26 DIAGNOSIS — B351 Tinea unguium: Secondary | ICD-10-CM

## 2018-08-26 LAB — BAYER DCA HB A1C WAIVED: HB A1C (BAYER DCA - WAIVED): 5.6 % (ref <7.0)

## 2018-08-26 MED ORDER — HYDROCHLOROTHIAZIDE 25 MG PO TABS: 12.5000 mg | ORAL_TABLET | Freq: Every day | ORAL | 0 refills | Status: DC

## 2018-08-26 MED ORDER — TAMSULOSIN HCL 0.4 MG PO CAPS: 0.4000 mg | ORAL_CAPSULE | Freq: Every day | ORAL | 1 refills | Status: DC

## 2018-08-26 MED FILL — TAMSULOSIN HCL 0.4 MG CAP: 0.4 | 90 days supply | Qty: 90 | Fill #0

## 2018-08-26 NOTE — Patient Instructions (Addendum)
Blood pressure looks fantastic.  We discussed that if you consistently use the hydrochlorothiazide, you will experience less episodes of increased urinary frequency.  I will contact you with results of your labs once they are available.  For your toenail, you can use topical nail treatments like Lamisil over-the-counter.  We discussed that the rate of reinfection is pretty high, particularly if you are not thoroughly sanitizing or changing out your shoes.  Mediterranean Diet A Mediterranean diet refers to food and lifestyle choices that are based on the traditions of countries located on the [employer]. This way of eating has been shown to help prevent certain conditions and improve outcomes for people who have chronic diseases, like kidney disease and heart disease. What are tips for following this plan? Lifestyle  Cook and eat meals together with your family, when possible.  Drink enough fluid to keep your urine clear or pale yellow.  Be physically active every day. This includes: ? Aerobic exercise like running or swimming. ? Leisure activities like gardening, walking, or housework.  Get 7-8 hours of sleep each night.  If recommended by your health care provider, drink red wine in moderation. This means 1 glass a day for nonpregnant women and 2 glasses a day for men. A glass of wine equals 5 oz (150 mL). Reading food labels   Check the serving size of packaged foods. For foods such as rice and pasta, the serving size refers to the amount of cooked product, not dry.  Check the total fat in packaged foods. Avoid foods that have saturated fat or trans fats.  Check the ingredients list for added sugars, such as corn syrup. Shopping  At the grocery store, buy most of your food from the areas near the walls of the store. This includes: ? Fresh fruits and vegetables (produce). ? Grains, beans, nuts, and seeds. Some of these may be available in unpackaged forms or large amounts  (in bulk). ? Fresh seafood. ? Poultry and eggs. ? Low-fat dairy products.  Buy whole ingredients instead of prepackaged foods.  Buy fresh fruits and vegetables in-season from local farmers markets.  Buy frozen fruits and vegetables in resealable bags.  If you do not have access to quality fresh seafood, buy precooked frozen shrimp or canned fish, such as tuna, salmon, or sardines.  Buy small amounts of raw or cooked vegetables, salads, or olives from the deli or salad bar at your store.  Stock your pantry so you always have certain foods on hand, such as olive oil, canned tuna, canned tomatoes, rice, pasta, and beans. Cooking  Cook foods with extra-virgin olive oil instead of using butter or other vegetable oils.  Have meat as a side dish, and have vegetables or grains as your main dish. This means having meat in small portions or adding small amounts of meat to foods like pasta or stew.  Use beans or vegetables instead of meat in common dishes like chili or lasagna.  Experiment with different cooking methods. Try roasting or broiling vegetables instead of steaming or sauteing them.  Add frozen vegetables to soups, stews, pasta, or rice.  Add nuts or seeds for added healthy fat at each meal. You can add these to yogurt, salads, or vegetable dishes.  Marinate fish or vegetables using olive oil, lemon juice, garlic, and fresh herbs. Meal planning   Plan to eat 1 vegetarian meal one day each week. Try to work up to 2 vegetarian meals, if possible.  Eat seafood 2 or  more times a week.  Have healthy snacks readily available, such as: ? Vegetable sticks with hummus. ? [place] yogurt. ? Fruit and nut trail mix.  Eat balanced meals throughout the week. This includes: ? Fruit: 2-3 servings a day ? Vegetables: 4-5 servings a day ? Low-fat dairy: 2 servings a day ? Fish, poultry, or lean meat: 1 serving a day ? Beans and legumes: 2 or more servings a week ? Nuts and seeds: 1-2  servings a day ? Whole grains: 6-8 servings a day ? Extra-virgin olive oil: 3-4 servings a day  Limit red meat and sweets to only a few servings a month What are my food choices?  Mediterranean diet ? Recommended ? Grains: Whole-grain pasta. Brown rice. Bulgar wheat. Polenta. Couscous. Whole-wheat bread. [place]. ? Vegetables: Artichokes. Beets. Broccoli. Cabbage. Carrots. Eggplant. Green beans. Chard. Kale. Spinach. Onions. Leeks. Peas. Squash. Tomatoes. Peppers. Radishes. ? Fruits: Apples. Apricots. Avocado. Berries. Bananas. Cherries. Dates. Figs. Grapes. Lemons. Melon. Oranges. Peaches. Plums. Pomegranate. ? Meats and other protein foods: Beans. Almonds. Sunflower seeds. Pine nuts. Peanuts. [place]. Salmon. Scallops. Shrimp. [place]. Tilapia. Clams. Oysters. Eggs. ? Dairy: Low-fat milk. Cheese. Greek yogurt. ? Beverages: Water. Red wine. Herbal tea. ? Fats and oils: Extra virgin olive oil. Avocado oil. Grape seed oil. ? Sweets and desserts: [place] yogurt with honey. Baked apples. Poached pears. Trail mix. ? Seasoning and other foods: Basil. Cilantro. Coriander. Cumin. Mint. Parsley. Sage. Rosemary. Tarragon. Garlic. Oregano. Thyme. Pepper. Balsalmic vinegar. Tahini. Hummus. Tomato sauce. Olives. Mushrooms. ? Limit these ? Grains: Prepackaged pasta or rice dishes. Prepackaged cereal with added sugar. ? Vegetables: Deep fried potatoes (french fries). ? Fruits: Fruit canned in syrup. ? Meats and other protein foods: Beef. Pork. Lamb. Poultry with skin. Hot dogs. [place] Salines. ? Dairy: Ice cream. Sour cream. Whole milk. ? Beverages: Juice. Sugar-sweetened soft drinks. Beer. Liquor and spirits. ? Fats and oils: Butter. Canola oil. Vegetable oil. Beef fat (tallow). Lard. ? Sweets and desserts: Cookies. Cakes. Pies. Candy. ? Seasoning and other foods: Mayonnaise. Premade sauces and marinades. ? The items listed may not be a complete list. Talk with your dietitian about what dietary choices are  right for you. Summary  The Mediterranean diet includes both food and lifestyle choices.  Eat a variety of fresh fruits and vegetables, beans, nuts, seeds, and whole grains.  Limit the amount of red meat and sweets that you eat.  Talk with your health care provider about whether it is safe for you to drink red wine in moderation. This means 1 glass a day for nonpregnant women and 2 glasses a day for men. A glass of wine equals 5 oz (150 mL). This information is not intended to replace advice given to you by your health care provider. Make sure you discuss any questions you have with your health care provider. Document Released: 02/09/2016 Document Revised: 03/13/2016 Document Reviewed: 02/09/2016 Elsevier Interactive Patient Education  2019 Else[employer] Inc[employer]   Fungal Nail Infection A fungal nail infection is a common infection of the toenails or fingernails. This condition affects toenails more often than fingernails. It often affects the great, or big, toes. More than one nail may be infected. The condition can be passed from person to person (is contagious). What are the causes? This condition is caused by a fungus. Several types of fungi can cause the infection. These fungi are common in moist and warm areas. If your hands or feet come into contact with the fungus, it may get into  a crack in your fingernail or toenail and cause the infection. What increases the risk? The following factors may make you more likely to develop this condition:  Being male.  Being of older age.  Living with someone who has the fungus.  Walking barefoot in areas where the fungus thrives, such as showers or locker rooms.  Wearing shoes and socks that cause your feet to sweat.  Having a nail injury or a recent nail surgery.  Having certain medical conditions, such as: ? Athlete's foot. ? Diabetes. ? Psoriasis. ? Poor circulation. ? A weak body defense system (immune system). What are the signs or  symptoms? Symptoms of this condition include:  A pale spot on the nail.  Thickening of the nail.  A nail that becomes yellow or brown.  A brittle or ragged nail edge.  A crumbling nail.  A nail that has lifted away from the nail bed. How is this diagnosed? This condition is diagnosed with a physical exam. Your health care provider may take a scraping or clipping from your nail to test for the fungus. How is this treated? Treatment is not needed for mild infections. If you have significant nail changes, treatment may include:  Antifungal medicines taken by mouth (orally). You may need to take the medicine for several weeks or several months, and you may not see the results for a long time. These medicines can cause side effects. Ask your health care provider what problems to watch for.  Antifungal nail polish or nail cream. These may be used along with oral antifungal medicines.  Laser treatment of the nail.  Surgery to remove the nail. This may be needed for the most severe infections. It can take a long time, usually up to a year, for the infection to go away. The infection may also come back. Follow these instructions at home: Medicines  Take or apply over-the-counter and prescription medicines only as told by your health care provider.  Ask your health care provider about using over-the-counter mentholated ointment on your nails. Nail care  Trim your nails often.  Wash and dry your hands and feet every day.  Keep your feet dry: ? Wear absorbent socks, and change your socks frequently. ? Wear shoes that allow air to circulate, such as sandals or canvas tennis shoes. Throw out old shoes.  Do not use artificial nails.  If you go to a nail salon, make sure you choose one that uses clean instruments.  Use antifungal foot powder on your feet and in your shoes. General instructions  Do not share personal items, such as towels or nail clippers.  Do not walk barefoot in  shower rooms or locker rooms.  Wear rubber gloves if you are working with your hands in wet areas.  Keep all follow-up visits as told by your health care provider. This is important. Contact a health care provider if: Your infection is not getting better or it is getting worse after several months. Summary  A fungal nail infection is a common infection of the toenails or fingernails.  Treatment is not needed for mild infections. If you have significant nail changes, treatment may include taking medicine orally and applying medicine to your nails.  It can take a long time, usually up to a year, for the infection to go away. The infection may also come back.  Take or apply over-the-counter and prescription medicines only as told by your health care provider.  Follow instructions for taking care of your  nails to help prevent infection from coming back or spreading. This information is not intended to replace advice given to you by your health care provider. Make sure you discuss any questions you have with your health care provider. Document Released: 06/15/2000 Document Revised: 11/22/2017 Document Reviewed: 11/22/2017 Elsevier Interactive Patient Education  2019 [employer].

## 2018-08-26 NOTE — Progress Notes (Signed)
Subjective: CC: Pre-DM, HTN, HLD; toenail fungus, BPH PCP: [name], DO GUY:QIHKVQ H Lawrence Bennett is a 63 y.o. male presenting to clinic today for:  1. Pre-DM/hypertension/hyperlipidemia Patient with known prediabetes.  Last A1c's have been 6.02.  He is currently treated with Seroquel for mood disorder by his psychiatrist.  Last metabolic panel was 6 months ago.  He reports compliance with Lipitor 80 mg daily, Norvasc 5 mg daily and hydrochlorothiazide 12.5 mg daily.  No chest pain, shortness of breath, lower extremity edema.  He reports a fairly typical American diet, citing that he will often eat at [employer].  He does eat vegetables but will often have dessert as well.  He eats breakfast daily but sometimes will skip lunch.  He drinks quite a bit of diet sodas.  2.  BPH Patient reports ongoing issues with urinary frequency throughout the day.  No nocturia.  He does have some difficulty stopping a stream but no difficulty starting his stream.  No hematuria.  Last PSA was within normal limits.  He is currently treated with Flomax.  3.  Toenail fungus Patient reports unknown duration of left-sided great toenail discoloration which she thinks is a fungus.  He has not used any topical treatments on this but wanted to know if they were perhaps better oral treatments.  Denies any pain.  ROS: Per HPI  No Known Allergies Past Medical History:  Diagnosis Date  . Anxiety   . Atherosclerosis of aorta ([place])   . Atrial contractions, premature   . Depression   . Dyslipidemia   . Elevated glucose 11/11/2015  . Hyperlipidemia   . Mild hypertension   . Urinary hesitancy     Current Outpatient Medications:  .  amLODipine (NORVASC) 5 MG tablet, TAKE 1 TABLET BY MOUTH DAILY., Disp: 90 tablet, Rfl: 1 .  aspirin EC 81 MG tablet, Take 1 tablet (81 mg total) by mouth daily., Disp: 90 tablet, Rfl: 3 .  atorvastatin (LIPITOR) 80 MG tablet, Take 1 tablet (80 mg total) by mouth daily., Disp: 180  tablet, Rfl: 0 .  carbamide peroxide (DEBROX) 6.5 % otic solution, Apply every 2 weeks just before your Saturday shower, Disp: 15 mL, Rfl: 0 .  Chlorphen-PE-Acetaminophen 4-10-325 MG TABS, Take 1 tablet by mouth every 6 (six) hours as needed., Disp: 6 tablet, Rfl: 0 .  divalproex (DEPAKOTE ER) 500 MG 24 hr tablet, Take 1 tablet (500 mg total) by mouth at bedtime., Disp: 90 tablet, Rfl: 2 .  hydrochlorothiazide (HYDRODIURIL) 25 MG tablet, Take 0.5-1 tablets (12.5-25 mg total) by mouth daily., Disp: 90 tablet, Rfl: 0 .  LORazepam (ATIVAN) 0.5 MG tablet, Take 1 tablet (0.5 mg total) by mouth 3 (three) times daily., Disp: 90 tablet, Rfl: 3 .  PARoxetine (PAXIL) 20 MG tablet, Take 1 tablet (20 mg total) by mouth at bedtime., Disp: 30 tablet, Rfl: 2 .  QUEtiapine (SEROQUEL) 50 MG tablet, Take 1 tablet (50 mg total) by mouth at bedtime., Disp: 90 tablet, Rfl: 2 .  tamsulosin (FLOMAX) 0.4 MG CAPS capsule, Take 1 capsule (0.4 mg total) by mouth 2 (two) times daily., Disp: 180 capsule, Rfl: 0 .  traZODone (DESYREL) 50 MG tablet, Take 1 tablet (50 mg total) by mouth at bedtime., Disp: 90 tablet, Rfl: 2 .  venlafaxine XR (EFFEXOR-XR) 75 MG 24 hr capsule, Take 1 capsule (75 mg total) by mouth daily with breakfast., Disp: 90 capsule, Rfl: 2 Social History   Socioeconomic History  . Marital status: Married  Spouse name: Not on file  . Number of children: Not on file  . Years of education: Not on file  . Highest education level: Not on file  Occupational History  . Not on file  Social Needs  . Financial resource strain: Not on file  . Food insecurity:    Worry: Not on file    Inability: Not on file  . Transportation needs:    Medical: Not on file    Non-medical: Not on file  Tobacco Use  . Smoking status: Former Smoker    Packs/day: 0.20    Years: 19.00    Pack years: 3.80    Types: Cigarettes  . Smokeless tobacco: Never Used  Substance and Sexual Activity  . Alcohol use: No    Alcohol/week:  0.0 standard drinks  . Drug use: No  . Sexual activity: Not Currently  Lifestyle  . Physical activity:    Days per week: Not on file    Minutes per session: Not on file  . Stress: Not on file  Relationships  . Social connections:    Talks on phone: Not on file    Gets together: Not on file    Attends religious service: Not on file    Active member of club or organization: Not on file    Attends meetings of clubs or organizations: Not on file    Relationship status: Not on file  . Intimate partner violence:    Fear of current or ex partner: Not on file    Emotionally abused: Not on file    Physically abused: Not on file    Forced sexual activity: Not on file  Other Topics Concern  . Not on file  Social History Narrative  . Not on file   Family History  Problem Relation Age of Onset  . Stroke Father   . Bipolar disorder Father   . Hypertension Sister   . Depression Sister   . COPD Mother     Objective: Office vital signs reviewed. BP 130/81   Pulse 87   Temp 97.9 F (36.6 C)   Ht _0  (1.778 m)   Wt 244 lb (110.7 kg)   BMI 35.01 kg/m   Physical Examination:  General: Awake, alert, well nourished, No acute distress HEENT: Normal, sclera white. MMM Cardio: regular rate and rhythm, S1S2 heard, no murmurs appreciated Pulm: clear to auscultation bilaterally, no wheezes, rhonchi or rales; normal work of breathing on room air Extremities: Warm, well-perfused, no edema.  Left great toenail with an area of yellow, white discoloration down the center.  He has mild thickening of the nail but no evidence of recent break or brittleness.  He is got good vascular flow to the feet.  He has similar dry, flaky skin along bilateral plantar aspects of the feet. Neuro: hard of hearing.  Assessment/ Plan: 63 y.o. male   1. Essential hypertension Under excellent control.  No changes made. - CMP14+EGFR - Lipid Panel  2. Anxiety Doing well from this standpoint.  He continues to  follow-up with Dr. Harrington Challenger  3. Major depressive disorder, recurrent episode, moderate (HCC) We will check metabolic labs given use of Seroquel and Depakote. - CMP14+EGFR - Lipid Panel - Bayer DCA Hb A1c Waived  4. Benign prostatic hyperplasia with weak urinary stream Continues to have lower urinary tract symptoms.  Continue Flomax.  Check PSA - PSA  5. Encounter for medication monitoring As above - CMP14+EGFR - Lipid Panel - Bayer DCA Hb A1c  Waived  6. Onychomycosis of toenail We discussed that there are oral medications like Lamisil but that this may be a high risk medication given concomitant use of other medications that affect the CY P4 50 system.  Would recommend use of topical antifungals prior to proceeding to any orals.  Home care instructions reviewed and a handout was provided.  7. Weight gain Patient has had about a 16 pound weight gain since our last visit in December.  Unsure if this is due to inactivity and poor diet versus metabolic etiology.  Check TSH.  We discussed the Mediterranean diet and I provided him a handout today.  His medicine may be causing increased weight as well.  Though I would hesitate to change this medication without the input of his psychiatrist. - TSH   Orders Placed This Encounter  Procedures  . CMP14+EGFR  . Lipid Panel  . PSA  . Bayer DCA Hb A1c Waived  . TSH   Meds ordered this encounter  Medications  . hydrochlorothiazide (HYDRODIURIL) 25 MG tablet    Sig: Take 0.5 tablets (12.5 mg total) by mouth daily.    Dispense:  90 tablet    Refill:  0  . tamsulosin (FLOMAX) 0.4 MG CAPS capsule    Sig: Take 1 capsule (0.4 mg total) by mouth daily after breakfast.    Dispense:  90 capsule    Refill:  [place], DO [place] [phone]

## 2018-08-27 ENCOUNTER — Telehealth: Payer: Self-pay | Admitting: Family Medicine

## 2018-08-27 LAB — CMP14+EGFR
A/G RATIO: 1.7 (ref 1.2–2.2)
ALBUMIN: 4.3 g/dL (ref 3.8–4.8)
ALK PHOS: 105 IU/L (ref 39–117)
ALT: 22 IU/L (ref 0–44)
AST: 17 IU/L (ref 0–40)
BILIRUBIN TOTAL: 0.4 mg/dL (ref 0.0–1.2)
BUN / CREAT RATIO: 11 (ref 10–24)
BUN: 11 mg/dL (ref 8–27)
CHLORIDE: 102 mmol/L (ref 96–106)
CO2: 20 mmol/L (ref 20–29)
Calcium: 9.4 mg/dL (ref 8.6–10.2)
Creatinine, Ser: 1.02 mg/dL (ref 0.76–1.27)
GFR calc non Af Amer: 77 mL/min/{1.73_m2} (ref >59)
GFR, EST AFRICAN AMERICAN: 89 mL/min/{1.73_m2} (ref >59)
GLOBULIN, TOTAL: 2.6 g/dL (ref 1.5–4.5)
Glucose: 137 mg/dL — ABNORMAL HIGH (ref 65–99)
POTASSIUM: 4.5 mmol/L (ref 3.5–5.2)
SODIUM: 138 mmol/L (ref 134–144)
TOTAL PROTEIN: 6.9 g/dL (ref 6.0–8.5)

## 2018-08-27 LAB — LIPID PANEL
CHOL/HDL RATIO: 3.6 ratio (ref 0.0–5.0)
Cholesterol, Total: 160 mg/dL (ref 100–199)
HDL: 45 mg/dL (ref >39)
LDL CALC: 80 mg/dL (ref 0–99)
Triglycerides: 173 mg/dL — ABNORMAL HIGH (ref 0–149)
VLDL Cholesterol Cal: 35 mg/dL (ref 5–40)

## 2018-08-27 LAB — PSA: Prostate Specific Ag, Serum: 1.8 ng/mL (ref 0.0–4.0)

## 2018-08-27 LAB — TSH: TSH: 1.3 u[IU]/mL (ref 0.450–4.500)

## 2018-08-28 NOTE — Telephone Encounter (Signed)
Advised pt that his hgb A1C was WNL and that RBS was a little elevated but pt wasn't fasting so advised him it was ok. Pt voiced understanding.

## 2018-09-03 MED FILL — LORazepam 0.5 MG TABS: 0.5 | 30 days supply | Qty: 90 | Fill #3

## 2018-09-10 ENCOUNTER — Other Ambulatory Visit (HOSPITAL_COMMUNITY): Payer: Self-pay | Admitting: Psychiatry

## 2018-09-10 MED FILL — QUETIAPINE FUMARATE 50 MG T: 50 | 90 days supply | Qty: 90 | Fill #0

## 2018-09-15 MED FILL — PARoxetine HCL 20 MG TABS: 20 | 30 days supply | Qty: 30 | Fill #0 | Status: TO

## 2018-10-02 ENCOUNTER — Encounter (HOSPITAL_COMMUNITY): Payer: Self-pay | Admitting: Psychiatry

## 2018-10-02 ENCOUNTER — Other Ambulatory Visit: Payer: Self-pay

## 2018-10-02 ENCOUNTER — Ambulatory Visit (INDEPENDENT_AMBULATORY_CARE_PROVIDER_SITE_OTHER): Payer: 59 | Admitting: Psychiatry

## 2018-10-02 DIAGNOSIS — Z79899 Other long term (current) drug therapy: Secondary | ICD-10-CM | POA: Diagnosis not present

## 2018-10-02 DIAGNOSIS — F3160 Bipolar disorder, current episode mixed, unspecified: Secondary | ICD-10-CM | POA: Diagnosis not present

## 2018-10-02 MED ORDER — PAROXETINE HCL 20 MG PO TABS: 20.0000 mg | ORAL_TABLET | Freq: Every day | ORAL | 2 refills | Status: DC

## 2018-10-02 MED ORDER — TRAZODONE HCL 50 MG PO TABS: 50.0000 mg | ORAL_TABLET | Freq: Every day | ORAL | 2 refills | Status: DC

## 2018-10-02 MED ORDER — DIVALPROEX SODIUM ER 500 MG PO TB24: 500.0000 mg | ORAL_TABLET | Freq: Every day | ORAL | 2 refills | Status: DC

## 2018-10-02 MED ORDER — LORAZEPAM 0.5 MG PO TABS: 0.5000 mg | ORAL_TABLET | Freq: Three times a day (TID) | ORAL | 3 refills | Status: DC

## 2018-10-02 MED ORDER — QUETIAPINE FUMARATE 50 MG PO TABS: 50.0000 mg | ORAL_TABLET | Freq: Every day | ORAL | 2 refills | Status: DC

## 2018-10-02 NOTE — Progress Notes (Signed)
Virtual Visit via Telephone Note  I connected with Lawrence Bennett on 10/02/18 at  1:20 PM EDT by telephone and verified that I am speaking with the correct person using two identifiers.   I discussed the limitations, risks, security and privacy concerns of performing an evaluation and management service by telephone and the availability of in person appointments. I also discussed with the patient that there may be a patient responsible charge related to this service. The patient expressed understanding and agreed to proceed.      I discussed the assessment and treatment plan with the patient. The patient was provided an opportunity to ask questions and all were answered. The patient agreed with the plan and demonstrated an understanding of the instructions.   The patient was advised to call back or seek an in-person evaluation if the symptoms worsen or if the condition fails to improve as anticipated.  I provided 15 minutes of non-face-to-face time during this encounter.   Levonne Spiller, MD  [facility] MD/PA/NP OP Progress Note  10/02/2018 1:38 PM Lawrence Bennett  MRN:  [id number]  Chief Complaint:  Chief Complaint    Anxiety; Depression; Follow-up     HPI: This patient is a 63 year old married white male who lives with his wife and 3 children in [place]. 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  The patient presents with his wife today. He is very hard of hearing and it's difficult to get a good history from him but she filled in the gaps. She states that he's had depression for at least one year. He started worrying about his job being discontinued or that [name] might close about a year ago. He finally decided to retire early in November. He also had some medical problems going on such as loss of vision and loss of hearing. In 2012 he had colon resection for diverticulosis.  Since retiring in November his mental status has declined. He  started staying in bed all the time being unable to function not attending to his ADLs. He stopped eating and lost about 10 pounds. He was not interacting with anyone at all. His primary doctor had put him on Zoloft but it was not helpful. He had been on a low dose of Ativan for years as well. Finally in the end of May his wife brought him to [name] ED for evaluation and from there he was transferred to [facility] geropsychiatry unit.  While on the unit he was diagnosed with severe depression. He was started on a number of medicines including Paxil and Effexor, Seroquel trazodone and Depakote. He also had 4 treatments of ECT while in the hospital and2as an outpatient. His last one was in early June.  Since getting out of the hospital he seems to be doing somewhat better. He is getting out of bed every day and doing things around the house and yard. His family went on a trip to a [place] and he refused to go with them. States that he's had a lot of short-term memory loss since having ECT but his wife thinks his mood is much better. He's never had psychotic symptoms such as auditory or visual hallucinations or paranoia but his thoughts were somewhat disorganized. He has never been suicidal or homicidal. He does not drink or use drugs. His wife thinks that he slowly getting better but he still not the person he used to be. He complains of  dry mouth secondary to all the medications. His Depakote level has not been checked since he left the hospital  The patient and wife were assessed using telephone interview as due to the coronavirus epidemic.  The patient states that he is doing "great."  He is sleeping well, has occasional nightmares but he is staying asleep through the night.  His mood is been good and he is staying busy refinishing furniture.  He denies significant anxiety.  He denies any thoughts of self-harm.  His wife concurs that his mood is excellent and he has not shown any  evidence of his past history of depression and anxiety or social withdrawal.  All of his medications still seem to be quite helpful Visit Diagnosis:    ICD-10-CM   1. Bipolar I disorder, most recent episode mixed ([place]) F31.60     Past Psychiatric History: Hospitalized in 2015 with no prior psychiatric history  Past Medical History:  Past Medical History:  Diagnosis Date  . Anxiety   . Atherosclerosis of aorta ([place])   . Atrial contractions, premature   . Depression   . Dyslipidemia   . Elevated glucose 11/11/2015  . Hyperlipidemia   . Mild hypertension   . Urinary hesitancy     Past Surgical History:  Procedure Laterality Date  . COLON RESECTION      Family Psychiatric History: see below  Family History:  Family History  Problem Relation Age of Onset  . Stroke Father   . Bipolar disorder Father   . Hypertension Sister   . Depression Sister   . COPD Mother     Social History:  Social History   Socioeconomic History  . Marital status: Married    Spouse name: Not on file  . Number of children: Not on file  . Years of education: Not on file  . Highest education level: Not on file  Occupational History  . Not on file  Social Needs  . Financial resource strain: Not on file  . Food insecurity:    Worry: Not on file    Inability: Not on file  . Transportation needs:    Medical: Not on file    Non-medical: Not on file  Tobacco Use  . Smoking status: Former Smoker    Packs/day: 0.20    Years: 19.00    Pack years: 3.80    Types: Cigarettes  . Smokeless tobacco: Never Used  Substance and Sexual Activity  . Alcohol use: No    Alcohol/week: 0.0 standard drinks  . Drug use: No  . Sexual activity: Not Currently  Lifestyle  . Physical activity:    Days per week: Not on file    Minutes per session: Not on file  . Stress: Not on file  Relationships  . Social connections:    Talks on phone: Not on file    Gets together: Not on file    Attends religious service:  Not on file    Active member of club or organization: Not on file    Attends meetings of clubs or organizations: Not on file    Relationship status: Not on file  Other Topics Concern  . Not on file  Social History Narrative  . Not on file    Allergies: No Known Allergies  Metabolic Disorder Labs: Lab Results  Component Value Date   HGBA1C 5.6 08/26/2018   No results found for: PROLACTIN Lab Results  Component Value Date   CHOL 160 08/26/2018   TRIG 173 (H)  08/26/2018   HDL 45 08/26/2018   CHOLHDL 3.6 08/26/2018   LDLCALC 80 08/26/2018   LDLCALC 87 11/21/2017   Lab Results  Component Value Date   TSH 1.300 08/26/2018   TSH 1.430 11/11/2015    Therapeutic Level Labs: No results found for: LITHIUM Lab Results  Component Value Date   VALPROATE 41.8 (L) 11/29/2016   VALPROATE 44 (L) 11/11/2015   No components found for:  CBMZ  Current Medications: Current Outpatient Medications  Medication Sig Dispense Refill  . amLODipine (NORVASC) 5 MG tablet TAKE 1 TABLET BY MOUTH DAILY. 90 tablet 1  . aspirin EC 81 MG tablet Take 1 tablet (81 mg total) by mouth daily. 90 tablet 3  . atorvastatin (LIPITOR) 80 MG tablet Take 1 tablet (80 mg total) by mouth daily. 180 tablet 0  . carbamide peroxide (DEBROX) 6.5 % otic solution Apply every 2 weeks just before your Saturday shower 15 mL 0  . Chlorphen-PE-Acetaminophen 4-10-325 MG TABS Take 1 tablet by mouth every 6 (six) hours as needed. 6 tablet 0  . divalproex (DEPAKOTE ER) 500 MG 24 hr tablet Take 1 tablet (500 mg total) by mouth at bedtime. 90 tablet 2  . hydrochlorothiazide (HYDRODIURIL) 25 MG tablet Take 0.5 tablets (12.5 mg total) by mouth daily. 90 tablet 0  . LORazepam (ATIVAN) 0.5 MG tablet Take 1 tablet (0.5 mg total) by mouth 3 (three) times daily. 90 tablet 3  . PARoxetine (PAXIL) 20 MG tablet Take 1 tablet (20 mg total) by mouth at bedtime. 30 tablet 2  . QUEtiapine (SEROQUEL) 50 MG tablet Take 1 tablet (50 mg total) by  mouth at bedtime. 90 tablet 2  . tamsulosin (FLOMAX) 0.4 MG CAPS capsule Take 1 capsule (0.4 mg total) by mouth daily after breakfast. 90 capsule 1  . traZODone (DESYREL) 50 MG tablet Take 1 tablet (50 mg total) by mouth at bedtime. 90 tablet 2  . venlafaxine XR (EFFEXOR-XR) 75 MG 24 hr capsule Take 1 capsule (75 mg total) by mouth daily with breakfast. 90 capsule 2   No current facility-administered medications for this visit.      Musculoskeletal: Strength & Muscle Tone:not assessed, phone interview Gait & Station:  Patient leans:   Psychiatric Specialty Exam: Review of Systems  All other systems reviewed and are negative.   There were no vitals taken for this visit.There is no height or weight on file to calculate BMI.  General Appearance: NA  Eye Contact:  NA  Speech:  Clear and Coherent  Volume:  Normal  Mood:  Euthymic  Affect:  NA  Thought Process:  Goal Directed  Orientation:  Full (Time, Place, and Person)  Thought Content: WDL   Suicidal Thoughts:  No  Homicidal Thoughts:  No  Memory:  Immediate;   Good Recent;   Good Remote;   Fair  Judgement:  Good  Insight:  Fair  Psychomotor Activity:  Normal  Concentration:  Concentration: Good and Attention Span: Good  Recall:  Good  Fund of Knowledge: Good  Language: Good  Akathisia:  No  Handed:  Right  AIMS (if indicated): not done  Assets:  Communication Skills Desire for Improvement Physical Health Resilience Social Support  ADL's:  Intact  Cognition: WNL  Sleep:  Good   Screenings: GAD-7     Office Visit from 08/26/2018 in [facility] Visit from 05/26/2018 in [facility]  Total GAD-7 Score  0  3    PHQ2-9  Office Visit from 08/26/2018 in [facility] Visit from 05/26/2018 in [facility] Visit from 04/23/2018 in [facility] Visit from 11/21/2017 in [facility] Visit from 08/28/2017 in [place]  PHQ-2 Total Score  0  0  0  0  0  PHQ-9 Total Score  0  0  -  -  -       Assessment and Plan: This patient is a 63 year old male with a history of severe depression requiring ECT in the past.  He is doing very well on his current regimen.  He will continue lorazepam 0.5 mg 3 times daily for anxiety, Paxil 20 mg at bedtime for depression, Seroquel 50 mg at bedtime for mood stabilization, trazodone 50 mg at bedtime for sleep Effexor XR 75 mg daily for depression and Depakote ER 500 mg daily at bedtime for mood stabilization.  He will return to see me in 3 months   Levonne Spiller, MD 10/02/2018, 1:38 PM

## 2018-10-03 ENCOUNTER — Other Ambulatory Visit: Payer: Self-pay | Admitting: Family Medicine

## 2018-10-03 MED FILL — HYDROCHLOROTHIAZIDE 25 MG T: 25 | 90 days supply | Qty: 90 | Fill #0

## 2018-10-03 MED FILL — ATORVASTATIN 80 MG TABLET: 80 | 90 days supply | Qty: 90 | Fill #0

## 2018-10-03 MED FILL — PARoxetine HCL 20 MG TABS: 20 | 30 days supply | Qty: 30 | Fill #0

## 2018-10-03 MED FILL — traZODone HCL 50 MG TABS: 50 | 90 days supply | Qty: 90 | Fill #0

## 2018-10-03 MED FILL — LORazepam 0.5 MG TABS: 0.5 | 30 days supply | Qty: 90 | Fill #0

## 2018-10-03 MED FILL — VENLAFAXINE HCL ER 75 MG CA: 75 | 90 days supply | Qty: 90 | Fill #0

## 2018-10-03 MED FILL — AMLODIPINE BESYLATE 5 MG TA: 5 | 90 days supply | Qty: 90 | Fill #0

## 2018-10-03 MED FILL — DIVALPROEX SOD ER 500 MG TA: 500 | 90 days supply | Qty: 90 | Fill #0

## 2018-11-05 MED FILL — LORazepam 0.5 MG TABS: 0.5 | 30 days supply | Qty: 90 | Fill #1

## 2018-11-18 MED FILL — PARoxetine HCL 20 MG TABS: 20 | 30 days supply | Qty: 30 | Fill #1

## 2018-12-01 ENCOUNTER — Other Ambulatory Visit: Payer: Self-pay

## 2018-12-02 ENCOUNTER — Telehealth: Payer: Self-pay | Admitting: Family Medicine

## 2018-12-02 ENCOUNTER — Ambulatory Visit: Payer: 59 | Admitting: Physician Assistant

## 2018-12-02 ENCOUNTER — Ambulatory Visit (INDEPENDENT_AMBULATORY_CARE_PROVIDER_SITE_OTHER): Payer: 59

## 2018-12-02 ENCOUNTER — Encounter: Payer: Self-pay | Admitting: Physician Assistant

## 2018-12-02 VITALS — BP 122/80 | HR 98 | Temp 98.4°F | Ht 70.0 in | Wt 235.4 lb

## 2018-12-02 DIAGNOSIS — R3912 Poor urinary stream: Secondary | ICD-10-CM | POA: Diagnosis not present

## 2018-12-02 DIAGNOSIS — M545 Low back pain, unspecified: Secondary | ICD-10-CM

## 2018-12-02 DIAGNOSIS — N401 Enlarged prostate with lower urinary tract symptoms: Secondary | ICD-10-CM

## 2018-12-02 MED ORDER — TAMSULOSIN HCL 0.4 MG PO CAPS: 0.4000 mg | ORAL_CAPSULE | Freq: Two times a day (BID) | ORAL | 1 refills | Status: DC

## 2018-12-02 MED ORDER — PREDNISONE 10 MG (48) PO TBPK: ORAL_TABLET | ORAL | 0 refills | Status: DC

## 2018-12-02 MED FILL — TAMSULOSIN HCL 0.4 MG CAP: 0.4 | 90 days supply | Qty: 180 | Fill #0

## 2018-12-02 MED FILL — predniSONE 10 MG (48) TBPK: 10 | 12 days supply | Qty: 48 | Fill #0

## 2018-12-02 NOTE — Patient Instructions (Signed)

## 2018-12-03 NOTE — Telephone Encounter (Signed)
Pt aware of xray results

## 2018-12-04 ENCOUNTER — Encounter: Payer: Self-pay | Admitting: Physician Assistant

## 2018-12-04 NOTE — Progress Notes (Signed)
BP 122/80   Pulse 98   Temp 98.4 F (36.9 C) (Oral)   Ht _0  (1.778 m)   Wt 235 lb 6.4 oz (106.8 kg)   BMI 33.78 kg/m    Subjective:    Patient ID: Lawrence Bennett, male    DOB: Feb 05, 1956, 63 y.o.   MRN: [id number]  HPI: Lawrence Bennett is a 63 y.o. male presenting on 12/02/2018 for Flank Pain (x 3 weeks) and Back Pain  Reports he has had several weeks of side pain down into the low back.  It does sometimes go into the right lateral hip.  He denies any weakness or falling.  He does not know of any issues that he has had with degenerative disc disease.  He has never had his back x-rayed.  We will have that performed today.  We will try some medication to see if we can get this to be calm down and have given him some back exercises to do slowly to try to strengthen his back.  He does need a refill on his Flomax.  He had incorrectly been taking only 1 a day went and was told to take twice a day.  So the prescription will be sent to the pharmacy.  Past Medical History:  Diagnosis Date  . Anxiety   . Atherosclerosis of aorta ([place])   . Atrial contractions, premature   . Depression   . Dyslipidemia   . Elevated glucose 11/11/2015  . Hyperlipidemia   . Mild hypertension   . Urinary hesitancy    Relevant past medical, surgical, family and social history reviewed and updated as indicated. Interim medical history since our last visit reviewed. Allergies and medications reviewed and updated. DATA REVIEWED: CHART IN EPIC  Family History reviewed for pertinent findings.  Review of Systems  Constitutional: Negative.  Negative for appetite change and fatigue.  Eyes: Negative for pain and visual disturbance.  Respiratory: Negative.  Negative for cough, chest tightness, shortness of breath and wheezing.   Cardiovascular: Negative.  Negative for chest pain, palpitations and leg swelling.  Gastrointestinal: Negative.  Negative for abdominal pain, diarrhea, nausea and vomiting.   Genitourinary: Negative.   Musculoskeletal: Positive for arthralgias, back pain and myalgias.  Skin: Negative.  Negative for color change and rash.  Neurological: Negative.  Negative for weakness, numbness and headaches.  Psychiatric/Behavioral: Negative.     Allergies as of 12/02/2018   No Known Allergies     Medication List       Accurate as of December 02, 2018 11:59 PM. If you have any questions, ask your nurse or doctor.        amLODipine 5 MG tablet Commonly known as:  NORVASC TAKE 1 TABLET BY MOUTH DAILY.   aspirin EC 81 MG tablet Take 1 tablet (81 mg total) by mouth daily.   atorvastatin 80 MG tablet Commonly known as:  LIPITOR Take 1 tablet (80 mg total) by mouth daily.   carbamide peroxide 6.5 % OTIC solution Commonly known as:  DEBROX Apply every 2 weeks just before your Saturday shower   Chlorphen-PE-Acetaminophen 4-10-325 MG Tabs Take 1 tablet by mouth every 6 (six) hours as needed.   divalproex 500 MG 24 hr tablet Commonly known as:  DEPAKOTE ER Take 1 tablet (500 mg total) by mouth at bedtime.   hydrochlorothiazide 25 MG tablet Commonly known as:  HYDRODIURIL TAKE 1/2 TO 1 TABLET BY MOUTH DAILY.   LORazepam 0.5 MG tablet Commonly known as:  ATIVAN  Take 1 tablet (0.5 mg total) by mouth 3 (three) times daily.   PARoxetine 20 MG tablet Commonly known as:  PAXIL Take 1 tablet (20 mg total) by mouth at bedtime.   predniSONE 10 MG (48) Tbpk tablet Commonly known as:  STERAPRED UNI-PAK 48 TAB Take as directed for 12 days Started by:  Terald Sleeper, PA-C   QUEtiapine 50 MG tablet Commonly known as:  SEROQUEL Take 1 tablet (50 mg total) by mouth at bedtime.   tamsulosin 0.4 MG Caps capsule Commonly known as:  FLOMAX Take 1 capsule (0.4 mg total) by mouth 2 (two) times a day. What changed:  when to take this Changed by:  Terald Sleeper, PA-C   traZODone 50 MG tablet Commonly known as:  DESYREL Take 1 tablet (50 mg total) by mouth at bedtime.    venlafaxine XR 75 MG 24 hr capsule Commonly known as:  EFFEXOR-XR Take 1 capsule (75 mg total) by mouth daily with breakfast.          Objective:    BP 122/80   Pulse 98   Temp 98.4 F (36.9 C) (Oral)   Ht _0  (1.778 m)   Wt 235 lb 6.4 oz (106.8 kg)   BMI 33.78 kg/m   No Known Allergies  Wt Readings from Last 3 Encounters:  12/02/18 235 lb 6.4 oz (106.8 kg)  08/26/18 244 lb (110.7 kg)  05/26/18 231 lb (104.8 kg)    Physical Exam Vitals signs and nursing note reviewed.  Constitutional:      General: He is not in acute distress.    Appearance: He is well-developed.  HENT:     Head: Normocephalic and atraumatic.  Eyes:     Conjunctiva/sclera: Conjunctivae normal.     Pupils: Pupils are equal, round, and reactive to light.  Cardiovascular:     Rate and Rhythm: Normal rate and regular rhythm.     Heart sounds: Normal heart sounds.  Pulmonary:     Effort: Pulmonary effort is normal. No respiratory distress.     Breath sounds: Normal breath sounds.  Musculoskeletal:     Lumbar back: He exhibits decreased range of motion, tenderness and pain.       Back:  Skin:    General: Skin is warm and dry.  Psychiatric:        Behavior: Behavior normal.     Results for orders placed or performed in visit on 08/26/18  CMP14+EGFR  Result Value Ref Range   Glucose 137 (H) 65 - 99 mg/dL   BUN 11 8 - 27 mg/dL   Creatinine, Ser 1.02 0.76 - 1.27 mg/dL   GFR calc non Af Amer 77 >59 mL/min/1.73   GFR calc Af Amer 89 >59 mL/min/1.73   BUN/Creatinine Ratio 11 10 - 24   Sodium 138 134 - 144 mmol/L   Potassium 4.5 3.5 - 5.2 mmol/L   Chloride 102 96 - 106 mmol/L   CO2 20 20 - 29 mmol/L   Calcium 9.4 8.6 - 10.2 mg/dL   Total Protein 6.9 6.0 - 8.5 g/dL   Albumin 4.3 3.8 - 4.8 g/dL   Globulin, Total 2.6 1.5 - 4.5 g/dL   Albumin/Globulin Ratio 1.7 1.2 - 2.2   Bilirubin Total 0.4 0.0 - 1.2 mg/dL   Alkaline Phosphatase 105 39 - 117 IU/L   AST 17 0 - 40 IU/L   ALT 22 0 - 44 IU/L   Lipid Panel  Result Value Ref Range   Cholesterol,  Total 160 100 - 199 mg/dL   Triglycerides 173 (H) 0 - 149 mg/dL   HDL 45 >39 mg/dL   VLDL Cholesterol Cal 35 5 - 40 mg/dL   LDL Calculated 80 0 - 99 mg/dL   Chol/HDL Ratio 3.6 0.0 - 5.0 ratio  PSA  Result Value Ref Range   Prostate Specific Ag, Serum 1.8 0.0 - 4.0 ng/mL  Bayer DCA Hb A1c Waived  Result Value Ref Range   HB A1C (BAYER DCA - WAIVED) 5.6 <7.0 %  TSH  Result Value Ref Range   TSH 1.300 0.450 - 4.500 uIU/mL      Assessment & Plan:   1. Benign prostatic hyperplasia with weak urinary stream - tamsulosin (FLOMAX) 0.4 MG CAPS capsule; Take 1 capsule (0.4 mg total) by mouth 2 (two) times a day.  Dispense: 180 capsule; Refill: 1  2. Acute right-sided low back pain without sciatica - DG Lumbar Spine 2-3 Views; Future - predniSONE (STERAPRED UNI-PAK 48 TAB) 10 MG (48) TBPK tablet; Take as directed for 12 days  Dispense: 48 tablet; Refill: 0   Continue all other maintenance medications as listed above.  Follow up plan: No follow-ups on file.  Educational handout given for back exercises  Terald Sleeper PA-C [facility] [address]  [place], [place] [address] [phone]   12/04/2018, 8:47 PM

## 2018-12-07 ENCOUNTER — Ambulatory Visit (INDEPENDENT_AMBULATORY_CARE_PROVIDER_SITE_OTHER): Payer: Self-pay | Admitting: Nurse Practitioner

## 2018-12-07 ENCOUNTER — Other Ambulatory Visit: Payer: Self-pay

## 2018-12-07 VITALS — BP 135/88 | HR 77 | Temp 98.5°F | Resp 18 | Wt 232.0 lb

## 2018-12-07 DIAGNOSIS — H6093 Unspecified otitis externa, bilateral: Secondary | ICD-10-CM

## 2018-12-07 DIAGNOSIS — H6123 Impacted cerumen, bilateral: Secondary | ICD-10-CM

## 2018-12-07 MED ORDER — OFLOXACIN 0.3 % OT SOLN: 5.0000 [drp] | Freq: Every day | OTIC | 0 refills | Status: AC

## 2018-12-07 NOTE — Progress Notes (Signed)
Subjective:    Lawrence Bennett is a 63 y.o. male who presents for evaluation of diminished hearing in both ears for the past 1 day.  The patient has been using ear drops to loosen wax immediately prior to this visit. The patient denies ear pain.  At baseline, per patient, he does have approximately 20% of his hearing left in the left ear and approximately 60% of his hearing left in the right ear.  Patient states that he worked for several years in a [employer], which caused his hearing loss.  The patient states he normally uses the right ear to communicate.  Patient states he went to bed and woke up with increased loss of hearing in the right ear.  The patient denies fever, chills, cough, runny nose, ear pain, ear drainage, headache, abdominal pain, nausea, or vomiting.  Patient states he did try to use Debrox and irrigate the ear 1 day ago without good relief.  Patient also states he attempted to "stick a bobby pin" in the ear to try the pull the wax out; however, he was unsuccessful.  The patient does have a history of cerumen impaction.  The patient's history has been marked as reviewed and updated as appropriate. Past Medical History:  Diagnosis Date  . Anxiety   . Atherosclerosis of aorta ([place])   . Atrial contractions, premature   . Depression   . Dyslipidemia   . Elevated glucose 11/11/2015  . Hyperlipidemia   . Mild hypertension   . Urinary hesitancy    Current Outpatient Medications  Medication Sig Dispense Refill  . amLODipine (NORVASC) 5 MG tablet TAKE 1 TABLET BY MOUTH DAILY. 90 tablet 1  . aspirin EC 81 MG tablet Take 1 tablet (81 mg total) by mouth daily. 90 tablet 3  . atorvastatin (LIPITOR) 80 MG tablet Take 1 tablet (80 mg total) by mouth daily. 180 tablet 0  . carbamide peroxide (DEBROX) 6.5 % otic solution Apply every 2 weeks just before your Saturday shower 15 mL 0  . Chlorphen-PE-Acetaminophen 4-10-325 MG TABS Take 1 tablet by mouth every 6 (six) hours as needed. 6 tablet  0  . divalproex (DEPAKOTE ER) 500 MG 24 hr tablet Take 1 tablet (500 mg total) by mouth at bedtime. 90 tablet 2  . hydrochlorothiazide (HYDRODIURIL) 25 MG tablet TAKE 1/2 TO 1 TABLET BY MOUTH DAILY. 90 tablet 0  . LORazepam (ATIVAN) 0.5 MG tablet Take 1 tablet (0.5 mg total) by mouth 3 (three) times daily. 90 tablet 3  . PARoxetine (PAXIL) 20 MG tablet Take 1 tablet (20 mg total) by mouth at bedtime. 30 tablet 2  . predniSONE (STERAPRED UNI-PAK 48 TAB) 10 MG (48) TBPK tablet Take as directed for 12 days 48 tablet 0  . QUEtiapine (SEROQUEL) 50 MG tablet Take 1 tablet (50 mg total) by mouth at bedtime. 90 tablet 2  . tamsulosin (FLOMAX) 0.4 MG CAPS capsule Take 1 capsule (0.4 mg total) by mouth 2 (two) times a day. 180 capsule 1  . traZODone (DESYREL) 50 MG tablet Take 1 tablet (50 mg total) by mouth at bedtime. 90 tablet 2  . venlafaxine XR (EFFEXOR-XR) 75 MG 24 hr capsule Take 1 capsule (75 mg total) by mouth daily with breakfast. 90 capsule 2   No current facility-administered medications for this visit.    Allergies: Patient has no known allergies.  Review of Systems Constitutional: negative Eyes: negative Ears, nose, mouth, throat, and face: positive for hearing loss and Bilateral cerumen impaction,  negative for ear drainage, earaches, hoarseness, nasal congestion and sore throat Respiratory: negative Cardiovascular: negative Gastrointestinal: negative Neurological: negative    Objective: Blood pressure 135/88, pulse 77, temperature 98.5 F (36.9 C), temperature source Oral, resp. rate 18, weight 232 lb (105.2 kg), SpO2 94 %.   Physical Exam  Constitutional: He is oriented to person, place, and time. He appears well-developed. No distress.  HENT:  Head: Normocephalic.  Right Ear: External ear normal. No drainage, swelling or tenderness. Decreased hearing (at baseline) is noted.  Left Ear: External ear normal. No drainage, swelling or tenderness. Decreased hearing (at baseline) is  noted.  Mouth/Throat: No oropharyngeal exudate.  Eyes: Pupils are equal, round, and reactive to light. Conjunctivae are normal.  Neck: Normal range of motion. Neck supple.  Cardiovascular: Normal rate and regular rhythm.  Pulmonary/Chest: Effort normal and breath sounds normal. No respiratory distress. He has no wheezes. He has no rales.  Neurological: He is alert and oriented to person, place, and time. No cranial nerve deficit. Coordination normal.  Skin: Skin is warm and dry.  Psychiatric: He has a normal mood and affect. His behavior is normal.     Auditory canal(s) of the right ear are completely obstructed with cerumen. Auditory canal of the left ear is partially obstructed. Cerumen was removed using gentle irrigation.  Large amount of cerumen buildup removed bilaterally.  Tympanic membranes are intact following the procedure.  Auditory canals are inflamed.    Assessment:    Cerumen Impaction with otitis externa.    Plan:   Exam findings, diagnosis etiology and medication use and indications reviewed with patient. Follow- Up and discharge instructions provided. No emergent/urgent issues found on exam.  Post irrigation, patient noted significant improvement in the hearing of the right ear.  Immediate relief with hearing also noted post ear irrigation.  We will go ahead and treat the patient for an external otitis media due to the significant of inflammation in the bilateral ear canals.  Instructions provided to the patient for home care to include use of over-the-counter earwax softener, and instructions on how to perform ear irrigation at home.  Discussed with the patient that he can also return to our office as needed for ear irrigation.  There was patient education was provided. Patient verbalized understanding of information provided and agrees with plan of care (POC), all questions answered. The patient is advised to call or return to clinic if condition does not see an improvement in  symptoms, or to seek the care of the closest emergency department if condition worsens with the above plan.   1. Otitis externa of both ears, unspecified chronicity, unspecified type  - ofloxacin (FLOXIN OTIC) 0.3 % OTIC solution; Place 5 drops into both ears daily for 7 days.  Dispense: 5 mL; Refill: 0 -Use eardrops as prescribed.  -Tylenol for ear pain or discomfort. -Avoid getting water in the ears while symptoms persist.  2. Bilateral impacted cerumen   -Continue using ear wax softener as directed. -May clean the ears with peroxide and normal saline solution as needed.  You can purchase the normal saline at a local pharmacy. -Do not use a Q-tip or bobby pin in the ear as you may cause damage to the ear drum. -Follow up with your PCP if your hearing changes.  -Follow up with our office as needed.

## 2018-12-07 NOTE — Patient Instructions (Addendum)
Otitis Externa  -Use eardrops as prescribed.  -Tylenol for ear pain or discomfort. -Avoid getting water in the ears while symptoms persist. -Continue using ear wax softener as directed. -May clean the ears with peroxide and normal saline solution as needed.  You can purchase the normal saline at a local pharmacy. -Do not use a Q-tip or bobby pin in the ear as you may cause damage to the ear drum. -Follow up with your PCP if your hearing changes.  -Follow up with our office as needed.          Otitis externa is an infection of the outer ear canal. The outer ear canal is the area between the outside of the ear and the eardrum. Otitis externa is sometimes called swimmer's ear. What are the causes? Common causes of this condition include:  Swimming in dirty water.  Moisture in the ear.  An injury to the inside of the ear.  An object stuck in the ear.  A cut or scrape on the outside of the ear. What increases the risk? You are more likely to develop this condition if you go swimming often. What are the signs or symptoms? The first symptom of this condition is often itching in the ear. Later symptoms of the condition include:  Swelling of the ear.  Redness in the ear.  Ear pain. The pain may get worse when you pull on your ear.  Pus coming from the ear. How is this diagnosed? This condition may be diagnosed by examining the ear and testing fluid from the ear for bacteria and funguses. How is this treated? This condition may be treated with:  Antibiotic ear drops. These are often given for 10-14 days.  Medicines to reduce itching and swelling. Follow these instructions at home:  If you were prescribed antibiotic ear drops, use them as told by your health care provider. Do not stop using the antibiotic even if your condition improves.  Take over-the-counter and prescription medicines only as told by your health care provider.  Avoid getting water in your ears as told  by your health care provider. This may include avoiding swimming or water sports for a few days.  Keep all follow-up visits as told by your health care provider. This is important. How is this prevented?  Keep your ears dry. Use the corner of a towel to dry your ears after you swim or bathe.  Avoid scratching or putting things in your ear. Doing these things can damage the ear canal or remove the protective wax that lines it, which makes it easier for bacteria and funguses to grow.  Avoid swimming in lakes, polluted water, or pools that may not have enough chlorine. Contact a health care provider if:  You have a fever.  Your ear is still red, swollen, painful, or draining pus after 3 days.  Your redness, swelling, or pain gets worse.  You have a severe headache.  You have redness, swelling, pain, or tenderness in the area behind your ear. Summary  Otitis externa is an infection of the outer ear canal.  Common causes include swimming in dirty water, moisture in the ear, or a cut or scrape in the ear.  Symptoms include pain, redness, and swelling of the ear.  If you were prescribed antibiotic ear drops, use them as told by your health care provider. Do not stop using the antibiotic even if your condition improves. This information is not intended to replace advice given to you by your  health care provider. Make sure you discuss any questions you have with your health care provider. Document Released: 06/18/2005 Document Revised: 11/22/2017 Document Reviewed: 11/22/2017 Elsevier Interactive Patient Education  2019 [place], Adult The ears produce a substance called earwax that helps keep bacteria out of the ear and protects the skin in the ear canal. Occasionally, earwax can build up in the ear and cause discomfort or hearing loss. What increases the risk? This condition is more likely to develop in people who:  Are male.  Are elderly.  Naturally produce  more earwax.  Clean their ears often with cotton swabs.  Use earplugs often.  Use in-ear headphones often.  Wear hearing aids.  Have narrow ear canals.  Have earwax that is overly thick or sticky.  Have eczema.  Are dehydrated.  Have excess hair in the ear canal. What are the signs or symptoms? Symptoms of this condition include:  Reduced or muffled hearing.  A feeling of fullness in the ear or feeling that the ear is plugged.  Fluid coming from the ear.  Ear pain.  Ear itch.  Ringing in the ear.  Coughing.  An obvious piece of earwax that can be seen inside the ear canal. How is this diagnosed? This condition may be diagnosed based on:  Your symptoms.  Your medical history.  An ear exam. During the exam, your health care provider will look into your ear with an instrument called an otoscope. You may have tests, including a hearing test. How is this treated? This condition may be treated by:  Using ear drops to soften the earwax.  Having the earwax removed by a health care provider. The health care provider may: ? Flush the ear with water. ? Use an instrument that has a loop on the end (curette). ? Use a suction device.  Surgery to remove the wax buildup. This may be done in severe cases. Follow these instructions at home:   Take over-the-counter and prescription medicines only as told by your health care provider.  Do not put any objects, including cotton swabs, into your ear. You can clean the opening of your ear canal with a washcloth or facial tissue.  Follow instructions from your health care provider about cleaning your ears. Do not over-clean your ears.  Drink enough fluid to keep your urine clear or pale yellow. This will help to thin the earwax.  Keep all follow-up visits as told by your health care provider. If earwax builds up in your ears often or if you use hearing aids, consider seeing your health care provider for routine, preventive  ear cleanings. Ask your health care provider how often you should schedule your cleanings.  If you have hearing aids, clean them according to instructions from the manufacturer and your health care provider. Contact a health care provider if:  You have ear pain.  You develop a fever.  You have blood, pus, or other fluid coming from your ear.  You have hearing loss.  You have ringing in your ears that does not go away.  Your symptoms do not improve with treatment.  You feel like the room is spinning (vertigo). Summary  Earwax can build up in the ear and cause discomfort or hearing loss.  The most common symptoms of this condition include reduced or muffled hearing and a feeling of fullness in the ear or feeling that the ear is plugged.  This condition may be diagnosed based on your symptoms, your medical  history, and an ear exam.  This condition may be treated by using ear drops to soften the earwax or by having the earwax removed by a health care provider.  Do not put any objects, including cotton swabs, into your ear. You can clean the opening of your ear canal with a washcloth or facial tissue. This information is not intended to replace advice given to you by your health care provider. Make sure you discuss any questions you have with your health care provider. Document Released: 07/26/2004 Document Revised: 05/30/2017 Document Reviewed: 08/29/2016 Elsevier Interactive Patient Education  2019 [employer].

## 2018-12-08 MED FILL — OFLOXACIN 0.3% EAR DROPS: 0.3 | 10 days supply | Qty: 5 | Fill #0

## 2018-12-08 MED FILL — LORazepam 0.5 MG TABS: 0.5 | 30 days supply | Qty: 90 | Fill #2

## 2018-12-09 ENCOUNTER — Telehealth: Payer: Self-pay

## 2018-12-09 NOTE — Telephone Encounter (Signed)
  Patient states he is feeling good, he is going start the treatment today, as he was not able to pick up the prescription on yesterday 12/08/2018.

## 2018-12-17 MED FILL — PARoxetine HCL 20 MG TABS: 20 | 30 days supply | Qty: 30 | Fill #0

## 2018-12-17 MED FILL — QUETIAPINE FUMARATE 50 MG T: 50 | 90 days supply | Qty: 90 | Fill #0

## 2019-01-05 ENCOUNTER — Encounter (HOSPITAL_COMMUNITY): Payer: Self-pay | Admitting: Psychiatry

## 2019-01-05 ENCOUNTER — Ambulatory Visit (INDEPENDENT_AMBULATORY_CARE_PROVIDER_SITE_OTHER): Payer: 59 | Admitting: Psychiatry

## 2019-01-05 ENCOUNTER — Other Ambulatory Visit: Payer: Self-pay

## 2019-01-05 DIAGNOSIS — F3160 Bipolar disorder, current episode mixed, unspecified: Secondary | ICD-10-CM

## 2019-01-05 MED ORDER — VENLAFAXINE HCL ER 75 MG PO CP24: 75.0000 mg | ORAL_CAPSULE | Freq: Every day | ORAL | 2 refills | Status: DC

## 2019-01-05 MED ORDER — DIVALPROEX SODIUM ER 500 MG PO TB24: 500.0000 mg | ORAL_TABLET | Freq: Every day | ORAL | 2 refills | Status: DC

## 2019-01-05 MED ORDER — TRAZODONE HCL 50 MG PO TABS: 50.0000 mg | ORAL_TABLET | Freq: Every day | ORAL | 2 refills | Status: DC

## 2019-01-05 MED ORDER — LORAZEPAM 0.5 MG PO TABS: 0.5000 mg | ORAL_TABLET | Freq: Three times a day (TID) | ORAL | 3 refills | Status: DC

## 2019-01-05 MED ORDER — PAROXETINE HCL 20 MG PO TABS: 20.0000 mg | ORAL_TABLET | Freq: Every day | ORAL | 2 refills | Status: DC

## 2019-01-05 MED ORDER — QUETIAPINE FUMARATE 50 MG PO TABS: 50.0000 mg | ORAL_TABLET | Freq: Every day | ORAL | 2 refills | Status: DC

## 2019-01-05 NOTE — Progress Notes (Signed)
Virtual Visit via Video Note  I connected with Lawrence Bennett on 01/05/19 at  1:00 PM EDT by a video enabled telemedicine application and verified that I am speaking with the correct person using two identifiers.   I discussed the limitations of evaluation and management by telemedicine and the availability of in person appointments. The patient expressed understanding and agreed to proceed.     I discussed the assessment and treatment plan with the patient. The patient was provided an opportunity to ask questions and all were answered. The patient agreed with the plan and demonstrated an understanding of the instructions.   The patient was advised to call back or seek an in-person evaluation if the symptoms worsen or if the condition fails to improve as anticipated.  I provided 15 minutes of non-face-to-face time during this encounter.   Lawrence Spiller, MD  [facility] MD/PA/NP OP Progress Note  01/05/2019 1:14 PM Lawrence Bennett  MRN:  [id number]  Chief Complaint:  Chief Complaint    Depression; Anxiety; Follow-up     HPI: This patient is a 63 year old married white male who lives with his wife and 3 children in [place]. 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  The patient presents with his wife today. He is very hard of hearing and it's difficult to get a good history from him but she filled in the gaps. She states that he's had depression for at least one year. He started worrying about his job being discontinued or that [name] might close about a year ago. He finally decided to retire early in November. He also had some medical problems going on such as loss of vision and loss of hearing. In 2012 he had colon resection for diverticulosis.  Since retiring in November his mental status has declined. He started staying in bed all the time being unable to function not attending to his ADLs. He stopped eating and lost about 10 pounds. He was  not interacting with anyone at all. His primary doctor had put him on Zoloft but it was not helpful. He had been on a low dose of Ativan for years as well. Finally in the end of May his wife brought him to [name] ED for evaluation and from there he was transferred to [facility] geropsychiatry unit.  While on the unit he was diagnosed with severe depression. He was started on a number of medicines including Paxil and Effexor, Seroquel trazodone and Depakote. He also had 4 treatments of ECT while in the hospital and2as an outpatient. His last one was in early June.  Since getting out of the hospital he seems to be doing somewhat better. He is getting out of bed every day and doing things around the house and yard. His family went on a trip to a [place] and he refused to go with them. States that he's had a lot of short-term memory loss since having ECT but his wife thinks his mood is much better. He's never had psychotic symptoms such as auditory or visual hallucinations or paranoia but his thoughts were somewhat disorganized. He has never been suicidal or homicidal. He does not drink or use drugs. His wife thinks that he slowly getting better but he still not the person he used to be. He complains of dry mouth secondary to all the medications. His Depakote level has not been checked since he left the hospital  The patient returns  for follow-up after 3 months.  He is assessed via telemedicine due to the coronavirus pandemic.  He states that he continues to do well.  He is spending most of his time building a screen porch for his house.  He is getting out in his boat.  He denies being depressed anxious or having any thoughts of self-harm or suicidal ideation.  He is sleeping well.  Recent lab work looked good and he is lost 10 pounds due to dieting Visit Diagnosis:    ICD-10-CM   1. Bipolar I disorder, most recent episode mixed ([place])  F31.60     Past Psychiatric History:  Hospitalization in 2015 with no prior psychiatric history  Past Medical History:  Past Medical History:  Diagnosis Date  . Anxiety   . Atherosclerosis of aorta ([place])   . Atrial contractions, premature   . Depression   . Dyslipidemia   . Elevated glucose 11/11/2015  . Hyperlipidemia   . Mild hypertension   . Urinary hesitancy     Past Surgical History:  Procedure Laterality Date  . COLON RESECTION      Family Psychiatric History: see below  Family History:  Family History  Problem Relation Age of Onset  . Stroke Father   . Bipolar disorder Father   . Hypertension Sister   . Depression Sister   . COPD Mother     Social History:  Social History   Socioeconomic History  . Marital status: Married    Spouse name: Not on file  . Number of children: Not on file  . Years of education: Not on file  . Highest education level: Not on file  Occupational History  . Not on file  Social Needs  . Financial resource strain: Not on file  . Food insecurity    Worry: Not on file    Inability: Not on file  . Transportation needs    Medical: Not on file    Non-medical: Not on file  Tobacco Use  . Smoking status: Former Smoker    Packs/day: 0.20    Years: 19.00    Pack years: 3.80    Types: Cigarettes  . Smokeless tobacco: Never Used  Substance and Sexual Activity  . Alcohol use: No    Alcohol/week: 0.0 standard drinks  . Drug use: No  . Sexual activity: Not Currently  Lifestyle  . Physical activity    Days per week: Not on file    Minutes per session: Not on file  . Stress: Not on file  Relationships  . Social Herbalist on phone: Not on file    Gets together: Not on file    Attends religious service: Not on file    Active member of club or organization: Not on file    Attends meetings of clubs or organizations: Not on file    Relationship status: Not on file  Other Topics Concern  . Not on file  Social History Narrative  . Not on file    Allergies:  No Known Allergies  Metabolic Disorder Labs: Lab Results  Component Value Date   HGBA1C 5.6 08/26/2018   No results found for: PROLACTIN Lab Results  Component Value Date   CHOL 160 08/26/2018   TRIG 173 (H) 08/26/2018   HDL 45 08/26/2018   CHOLHDL 3.6 08/26/2018   LDLCALC 80 08/26/2018   LDLCALC 87 11/21/2017   Lab Results  Component Value Date   TSH 1.300 08/26/2018   TSH 1.430 11/11/2015  Therapeutic Level Labs: No results found for: LITHIUM Lab Results  Component Value Date   VALPROATE 41.8 (L) 11/29/2016   VALPROATE 44 (L) 11/11/2015   No components found for:  CBMZ  Current Medications: Current Outpatient Medications  Medication Sig Dispense Refill  . amLODipine (NORVASC) 5 MG tablet TAKE 1 TABLET BY MOUTH DAILY. 90 tablet 1  . aspirin EC 81 MG tablet Take 1 tablet (81 mg total) by mouth daily. 90 tablet 3  . atorvastatin (LIPITOR) 80 MG tablet Take 1 tablet (80 mg total) by mouth daily. 180 tablet 0  . carbamide peroxide (DEBROX) 6.5 % otic solution Apply every 2 weeks just before your Saturday shower 15 mL 0  . Chlorphen-PE-Acetaminophen 4-10-325 MG TABS Take 1 tablet by mouth every 6 (six) hours as needed. 6 tablet 0  . divalproex (DEPAKOTE ER) 500 MG 24 hr tablet Take 1 tablet (500 mg total) by mouth at bedtime. 90 tablet 2  . hydrochlorothiazide (HYDRODIURIL) 25 MG tablet TAKE 1/2 TO 1 TABLET BY MOUTH DAILY. 90 tablet 0  . LORazepam (ATIVAN) 0.5 MG tablet Take 1 tablet (0.5 mg total) by mouth 3 (three) times daily. 90 tablet 3  . PARoxetine (PAXIL) 20 MG tablet Take 1 tablet (20 mg total) by mouth at bedtime. 30 tablet 2  . predniSONE (STERAPRED UNI-PAK 48 TAB) 10 MG (48) TBPK tablet Take as directed for 12 days 48 tablet 0  . QUEtiapine (SEROQUEL) 50 MG tablet Take 1 tablet (50 mg total) by mouth at bedtime. 90 tablet 2  . tamsulosin (FLOMAX) 0.4 MG CAPS capsule Take 1 capsule (0.4 mg total) by mouth 2 (two) times a day. 180 capsule 1  . traZODone  (DESYREL) 50 MG tablet Take 1 tablet (50 mg total) by mouth at bedtime. 90 tablet 2  . venlafaxine XR (EFFEXOR-XR) 75 MG 24 hr capsule Take 1 capsule (75 mg total) by mouth daily with breakfast. 90 capsule 2   No current facility-administered medications for this visit.      Musculoskeletal: Strength & Muscle Tone: within normal limits Gait & Station: normal Patient leans: N/A  Psychiatric Specialty Exam: Review of Systems  All other systems reviewed and are negative.   There were no vitals taken for this visit.There is no height or weight on file to calculate BMI.  General Appearance: Casual and Fairly Groomed  Eye Contact:  Good  Speech:  Clear and Coherent  Volume:  Normal  Mood:  Euthymic  Affect:  Appropriate and Congruent  Thought Process:  Goal Directed  Orientation:  Full (Time, Place, and Person)  Thought Content: WDL   Suicidal Thoughts:  No  Homicidal Thoughts:  No  Memory:  Immediate;   Good Recent;   Good Remote;   Fair  Judgement:  Good  Insight:  Fair  Psychomotor Activity:  Normal  Concentration:  Concentration: Good and Attention Span: Good  Recall:  Good  Fund of Knowledge: Good  Language: Good  Akathisia:  No  Handed:  Right  AIMS (if indicated): not done  Assets:  Communication Skills Desire for Improvement Physical Health Resilience Social Support Talents/Skills  ADL's:  Intact  Cognition: WNL  Sleep:  Good   Screenings: GAD-7     Office Visit from 08/26/2018 in [facility] Visit from 05/26/2018 in [facility]  Total GAD-7 Score  0  3    PHQ2-9     Office Visit from 12/02/2018 in [place] Office Visit from  08/26/2018 in [place] Visit from 05/26/2018 in [facility] Visit from 04/23/2018 in [place] Visit from 11/21/2017 in [place]  PHQ-2 Total  Score  0  0  0  0  0  PHQ-9 Total Score  -  0  0  -  -       Assessment and Plan: This patient is a 63 year old male with a history of severe depression requiring ECT in the past.  He continues to do very well on his current regimen.  He will continue lorazepam 0.5 mg 3 times daily for anxiety, Paxil 20 mg at bedtime for depression, Seroquel 50 mg at bedtime for mood stabilization, trazodone 50 mg at bedtime for sleep, Effexor XR 75 mg daily for depression and Depakote ER 500 mg daily at bedtime for mood stabilization.  She will return to see me in 3 months   Lawrence Spiller, MD 01/05/2019, 1:14 PM

## 2019-01-06 MED FILL — DIVALPROEX SOD ER 500 MG TA: 500 | 90 days supply | Qty: 90 | Fill #0

## 2019-01-06 MED FILL — VENLAFAXINE HCL ER 75 MG CA: 75 | 90 days supply | Qty: 90 | Fill #1

## 2019-01-06 MED FILL — LORazepam 0.5 MG TABS: 0.5 | 30 days supply | Qty: 90 | Fill #3

## 2019-01-10 MED FILL — PARoxetine HCL 20 MG TABS: 20 | 30 days supply | Qty: 30 | Fill #1

## 2019-01-20 ENCOUNTER — Other Ambulatory Visit: Payer: Self-pay | Admitting: Family Medicine

## 2019-01-20 MED FILL — traZODone HCL 50 MG TABS: 50 | 90 days supply | Qty: 90 | Fill #1

## 2019-01-20 MED FILL — AMLODIPINE BESYLATE 5 MG TA: 5 | 90 days supply | Qty: 90 | Fill #0

## 2019-01-20 MED FILL — PARoxetine HCL 20 MG TABS: 20 | 30 days supply | Qty: 30 | Fill #1

## 2019-02-03 ENCOUNTER — Other Ambulatory Visit (HOSPITAL_COMMUNITY): Payer: Self-pay | Admitting: Psychiatry

## 2019-02-03 ENCOUNTER — Other Ambulatory Visit: Payer: Self-pay | Admitting: Family Medicine

## 2019-02-03 MED FILL — LORazepam 0.5 MG TABS: 0.5 | 30 days supply | Qty: 90 | Fill #0

## 2019-02-04 MED FILL — ATORVASTATIN 80 MG TABLET: 80 | 90 days supply | Qty: 90 | Fill #0

## 2019-02-12 MED FILL — PARoxetine HCL 20 MG TABS: 20 | 30 days supply | Qty: 30 | Fill #2

## 2019-02-24 ENCOUNTER — Ambulatory Visit: Payer: 59 | Admitting: Family Medicine

## 2019-03-15 MED FILL — LORazepam 0.5 MG TABS: 0.5 | 30 days supply | Qty: 90 | Fill #1

## 2019-03-16 ENCOUNTER — Other Ambulatory Visit (HOSPITAL_COMMUNITY): Payer: Self-pay | Admitting: Psychiatry

## 2019-03-16 MED ORDER — PAROXETINE HCL 20 MG PO TABS: 20.0000 mg | ORAL_TABLET | Freq: Every day | ORAL | 2 refills | Status: DC

## 2019-03-16 MED ORDER — QUETIAPINE FUMARATE 50 MG PO TABS: 50.0000 mg | ORAL_TABLET | Freq: Every day | ORAL | 2 refills | Status: DC

## 2019-03-16 MED FILL — QUETIAPINE FUMARATE 50 MG T: 50 | 90 days supply | Qty: 90 | Fill #0

## 2019-03-16 MED FILL — PARoxetine HCL 20 MG TABS: 20 | 30 days supply | Qty: 30 | Fill #0

## 2019-03-24 ENCOUNTER — Other Ambulatory Visit: Payer: Self-pay

## 2019-03-25 ENCOUNTER — Ambulatory Visit: Payer: 59 | Admitting: Family Medicine

## 2019-03-25 ENCOUNTER — Encounter: Payer: Self-pay | Admitting: Family Medicine

## 2019-03-25 ENCOUNTER — Other Ambulatory Visit: Payer: Self-pay

## 2019-03-25 VITALS — BP 117/74 | HR 85 | Temp 97.7°F | Ht 70.0 in | Wt 243.0 lb

## 2019-03-25 DIAGNOSIS — Z23 Encounter for immunization: Secondary | ICD-10-CM | POA: Diagnosis not present

## 2019-03-25 DIAGNOSIS — E782 Mixed hyperlipidemia: Secondary | ICD-10-CM | POA: Diagnosis not present

## 2019-03-25 DIAGNOSIS — F331 Major depressive disorder, recurrent, moderate: Secondary | ICD-10-CM

## 2019-03-25 DIAGNOSIS — F419 Anxiety disorder, unspecified: Secondary | ICD-10-CM

## 2019-03-25 DIAGNOSIS — I10 Essential (primary) hypertension: Secondary | ICD-10-CM | POA: Diagnosis not present

## 2019-03-25 DIAGNOSIS — N401 Enlarged prostate with lower urinary tract symptoms: Secondary | ICD-10-CM

## 2019-03-25 DIAGNOSIS — R3912 Poor urinary stream: Secondary | ICD-10-CM | POA: Diagnosis not present

## 2019-03-25 DIAGNOSIS — K5900 Constipation, unspecified: Secondary | ICD-10-CM

## 2019-03-25 DIAGNOSIS — Z5181 Encounter for therapeutic drug level monitoring: Secondary | ICD-10-CM

## 2019-03-25 MED ORDER — HYDROCHLOROTHIAZIDE 25 MG PO TABS: 12.5000 mg | ORAL_TABLET | Freq: Every day | ORAL | 3 refills | Status: DC

## 2019-03-25 MED ORDER — ATORVASTATIN CALCIUM 80 MG PO TABS: 80.0000 mg | ORAL_TABLET | Freq: Every day | ORAL | 3 refills | Status: DC

## 2019-03-25 MED ORDER — AMLODIPINE BESYLATE 5 MG PO TABS: 5.0000 mg | ORAL_TABLET | Freq: Every day | ORAL | 3 refills | Status: DC

## 2019-03-25 MED FILL — HYDROCHLOROTHIAZIDE 25 MG T: 25 | 90 days supply | Qty: 90 | Fill #0

## 2019-03-25 NOTE — Patient Instructions (Addendum)
You had labs performed today.  You will be contacted with the results of the labs once they are available, usually in the next 3 business days for routine lab work.  If you have an active my chart account, they will be released to your MyChart.  If you prefer to have these labs released to you via telephone, please let us know.  Increase water intake.  If you still have difficulty with stooling, consider adding miralax.  Mix 1 capful in 8 ounces of water and drink daily.

## 2019-03-25 NOTE — Progress Notes (Signed)
Subjective: CC: Pre-DM, HTN, HLD; toenail fungus, BPH PCP: [name], DO Lawrence Bennett is a 63 y.o. male presenting to clinic today for:  1.Hypertension/hyperlipidemia Patient reports compliance with his Norvasc 5 mg daily and hydrochlorothiazide.  He is treated with Depakote and Seroquel by his psychiatrist.  He has a history of CVA and is subsequently treated with Lipitor 80 mg daily.  He reports compliance with this.  Denies any chest pain, shortness of breath, lower extremity edema, dizziness.  2.  BPH He has urinary frequency throughout the day but denies any nocturia, urinary hesitancy or urgency.  He is compliant with Flomax 0.4 mg daily and never went up to the twice daily dosing.  3.  Difficulty with bowel movements Patient reports that he has bowel movements every day and that they are often normal caliber and soft but he has difficulty fully evacuating his bowels.  He often will have to digitally remove feces.  He does not drink adequate water and admits to drinking quite a bit of diet drinks.  He feels like he has enough fiber in his diet though.  ROS: Per HPI  No Known Allergies Past Medical History:  Diagnosis Date  . Anxiety   . Atherosclerosis of aorta ([place])   . Atrial contractions, premature   . Depression   . Dyslipidemia   . Elevated glucose 11/11/2015  . Hyperlipidemia   . Mild hypertension   . Urinary hesitancy     Current Outpatient Medications:  .  amLODipine (NORVASC) 5 MG tablet, TAKE 1 TABLET BY MOUTH DAILY., Disp: 90 tablet, Rfl: 0 .  aspirin EC 81 MG tablet, Take 1 tablet (81 mg total) by mouth daily., Disp: 90 tablet, Rfl: 3 .  atorvastatin (LIPITOR) 80 MG tablet, TAKE 1 TABLET BY MOUTH DAILY., Disp: 180 tablet, Rfl: 0 .  divalproex (DEPAKOTE ER) 500 MG 24 hr tablet, Take 1 tablet (500 mg total) by mouth at bedtime., Disp: 90 tablet, Rfl: 2 .  hydrochlorothiazide (HYDRODIURIL) 25 MG tablet, TAKE 1/2 TO 1 TABLET BY MOUTH DAILY., Disp:  90 tablet, Rfl: 0 .  LORazepam (ATIVAN) 0.5 MG tablet, Take 1 tablet (0.5 mg total) by mouth 3 (three) times daily., Disp: 90 tablet, Rfl: 3 .  PARoxetine (PAXIL) 20 MG tablet, Take 1 tablet (20 mg total) by mouth at bedtime., Disp: 90 tablet, Rfl: 2 .  QUEtiapine (SEROQUEL) 50 MG tablet, Take 1 tablet (50 mg total) by mouth at bedtime., Disp: 90 tablet, Rfl: 2 .  tamsulosin (FLOMAX) 0.4 MG CAPS capsule, Take 1 capsule (0.4 mg total) by mouth 2 (two) times a day., Disp: 180 capsule, Rfl: 1 .  traZODone (DESYREL) 50 MG tablet, Take 1 tablet (50 mg total) by mouth at bedtime., Disp: 90 tablet, Rfl: 2 .  venlafaxine XR (EFFEXOR-XR) 75 MG 24 hr capsule, Take 1 capsule (75 mg total) by mouth daily with breakfast., Disp: 90 capsule, Rfl: 2 Social History   Socioeconomic History  . Marital status: Married    Spouse name: Not on file  . Number of children: Not on file  . Years of education: Not on file  . Highest education level: Not on file  Occupational History  . Not on file  Social Needs  . Financial resource strain: Not on file  . Food insecurity    Worry: Not on file    Inability: Not on file  . Transportation needs    Medical: Not on file    Non-medical: Not on  file  Tobacco Use  . Smoking status: Former Smoker    Packs/day: 0.20    Years: 19.00    Pack years: 3.80    Types: Cigarettes  . Smokeless tobacco: Never Used  Substance and Sexual Activity  . Alcohol use: No    Alcohol/week: 0.0 standard drinks  . Drug use: No  . Sexual activity: Not Currently  Lifestyle  . Physical activity    Days per week: Not on file    Minutes per session: Not on file  . Stress: Not on file  Relationships  . Social Herbalist on phone: Not on file    Gets together: Not on file    Attends religious service: Not on file    Active member of club or organization: Not on file    Attends meetings of clubs or organizations: Not on file    Relationship status: Not on file  . Intimate  partner violence    Fear of current or ex partner: Not on file    Emotionally abused: Not on file    Physically abused: Not on file    Forced sexual activity: Not on file  Other Topics Concern  . Not on file  Social History Narrative  . Not on file   Family History  Problem Relation Age of Onset  . Stroke Father   . Bipolar disorder Father   . Hypertension Sister   . Depression Sister   . COPD Mother     Objective: Office vital signs reviewed. BP 117/74   Pulse 85   Temp 97.7 F (36.5 C) (Temporal)   Ht '5\' 10"'$  (1.778 m)   Wt 243 lb (110.2 kg)   SpO2 94%   BMI 34.87 kg/m   Physical Examination:  General: Awake, alert, well nourished, No acute distress HEENT: Normal, sclera white. MMM; no carotid bruits Cardio: regular rate and rhythm, S1S2 heard, no murmurs appreciated Pulm: clear to auscultation bilaterally, no wheezes, rhonchi or rales; normal work of breathing on room air Extremities: Warm, well-perfused, no edema. Neuro: hard of hearing.  Assessment/ Plan: 63 y.o. male   1. Difficulty defecating We discussed increasing water, physical activity and possibly adding MiraLAX to promote evacuation of bowel.  If unsuccessful may consider having him see his gastroenterologist to rule out anatomic abnormality  2. Essential hypertension Controlled.  Continue current regimen - amLODipine (NORVASC) 5 MG tablet; Take 1 tablet (5 mg total) by mouth daily.  Dispense: 90 tablet; Refill: 3 - hydrochlorothiazide (HYDRODIURIL) 25 MG tablet; Take 0.5-1 tablets (12.5-25 mg total) by mouth daily.  Dispense: 90 tablet; Refill: 3  3. Mixed hyperlipidemia Check nonfasting lipid panel - CMP14+EGFR - Lipid Panel - atorvastatin (LIPITOR) 80 MG tablet; Take 1 tablet (80 mg total) by mouth daily.  Dispense: 90 tablet; Refill: 3  4. Benign prostatic hyperplasia with weak urinary stream Symptoms are stable. - PSA  5. Anxiety Managed by Dr. Harrington Challenger  6. Major depressive disorder,  recurrent episode, moderate (HCC)  7. Medication monitoring encounter We will check lipid, CBC and CMP given use of atypical antipsychotic and Depakote. - CMP14+EGFR - Lipid Panel - CBC    No orders of the defined types were placed in this encounter.  No orders of the defined types were placed in this encounter.    [name], DO [place] [phone]

## 2019-03-26 DIAGNOSIS — F419 Anxiety disorder, unspecified: Secondary | ICD-10-CM | POA: Diagnosis not present

## 2019-03-26 DIAGNOSIS — N401 Enlarged prostate with lower urinary tract symptoms: Secondary | ICD-10-CM | POA: Diagnosis not present

## 2019-03-26 DIAGNOSIS — I10 Essential (primary) hypertension: Secondary | ICD-10-CM | POA: Diagnosis not present

## 2019-03-26 DIAGNOSIS — R3912 Poor urinary stream: Secondary | ICD-10-CM | POA: Diagnosis not present

## 2019-03-26 DIAGNOSIS — K5900 Constipation, unspecified: Secondary | ICD-10-CM | POA: Diagnosis not present

## 2019-03-26 DIAGNOSIS — Z5181 Encounter for therapeutic drug level monitoring: Secondary | ICD-10-CM | POA: Diagnosis not present

## 2019-03-26 DIAGNOSIS — Z23 Encounter for immunization: Secondary | ICD-10-CM | POA: Diagnosis not present

## 2019-03-26 DIAGNOSIS — E782 Mixed hyperlipidemia: Secondary | ICD-10-CM | POA: Diagnosis not present

## 2019-03-26 DIAGNOSIS — F331 Major depressive disorder, recurrent, moderate: Secondary | ICD-10-CM | POA: Diagnosis not present

## 2019-03-26 LAB — CBC
Hematocrit: 42.2 % (ref 37.5–51.0)
Hemoglobin: 14.7 g/dL (ref 13.0–17.7)
MCH: 29.7 pg (ref 26.6–33.0)
MCHC: 34.8 g/dL (ref 31.5–35.7)
MCV: 85 fL (ref 79–97)
Platelets: 283 10*3/uL (ref 150–450)
RBC: 4.95 x10E6/uL (ref 4.14–5.80)
RDW: 14.6 % (ref 11.6–15.4)
WBC: 5.5 10*3/uL (ref 3.4–10.8)

## 2019-03-26 LAB — LIPID PANEL
Chol/HDL Ratio: 3.7 ratio (ref 0.0–5.0)
Cholesterol, Total: 171 mg/dL (ref 100–199)
HDL: 46 mg/dL (ref >39)
LDL Chol Calc (NIH): 98 mg/dL (ref 0–99)
Triglycerides: 155 mg/dL — ABNORMAL HIGH (ref 0–149)
VLDL Cholesterol Cal: 27 mg/dL (ref 5–40)

## 2019-03-26 LAB — CMP14+EGFR
ALT: 20 IU/L (ref 0–44)
AST: 19 IU/L (ref 0–40)
Albumin/Globulin Ratio: 1.7 (ref 1.2–2.2)
Albumin: 4 g/dL (ref 3.8–4.8)
Alkaline Phosphatase: 101 IU/L (ref 39–117)
BUN/Creatinine Ratio: 12 (ref 10–24)
BUN: 14 mg/dL (ref 8–27)
Bilirubin Total: 0.4 mg/dL (ref 0.0–1.2)
CO2: 22 mmol/L (ref 20–29)
Calcium: 9.2 mg/dL (ref 8.6–10.2)
Chloride: 99 mmol/L (ref 96–106)
Creatinine, Ser: 1.14 mg/dL (ref 0.76–1.27)
GFR calc Af Amer: 78 mL/min/{1.73_m2} (ref >59)
GFR calc non Af Amer: 68 mL/min/{1.73_m2} (ref >59)
Globulin, Total: 2.3 g/dL (ref 1.5–4.5)
Glucose: 161 mg/dL — ABNORMAL HIGH (ref 65–99)
Potassium: 4.2 mmol/L (ref 3.5–5.2)
Sodium: 136 mmol/L (ref 134–144)
Total Protein: 6.3 g/dL (ref 6.0–8.5)

## 2019-03-26 LAB — PSA: Prostate Specific Ag, Serum: 1.7 ng/mL (ref 0.0–4.0)

## 2019-04-06 ENCOUNTER — Other Ambulatory Visit (HOSPITAL_COMMUNITY): Payer: Self-pay | Admitting: Psychiatry

## 2019-04-06 MED ORDER — DIVALPROEX SODIUM ER 500 MG PO TB24: 500.0000 mg | ORAL_TABLET | Freq: Every day | ORAL | 2 refills | Status: DC

## 2019-04-06 MED FILL — DIVALPROEX SOD ER 500 MG TA: 500 | 90 days supply | Qty: 90 | Fill #0

## 2019-04-08 ENCOUNTER — Ambulatory Visit (INDEPENDENT_AMBULATORY_CARE_PROVIDER_SITE_OTHER): Payer: 59 | Admitting: Psychiatry

## 2019-04-08 ENCOUNTER — Other Ambulatory Visit: Payer: Self-pay

## 2019-04-08 ENCOUNTER — Encounter (HOSPITAL_COMMUNITY): Payer: Self-pay | Admitting: Psychiatry

## 2019-04-08 DIAGNOSIS — F322 Major depressive disorder, single episode, severe without psychotic features: Secondary | ICD-10-CM

## 2019-04-08 DIAGNOSIS — F3160 Bipolar disorder, current episode mixed, unspecified: Secondary | ICD-10-CM | POA: Diagnosis not present

## 2019-04-08 MED ORDER — PAROXETINE HCL 20 MG PO TABS: 20.0000 mg | ORAL_TABLET | Freq: Every day | ORAL | 2 refills | Status: DC

## 2019-04-08 MED ORDER — QUETIAPINE FUMARATE 50 MG PO TABS: 50.0000 mg | ORAL_TABLET | Freq: Every day | ORAL | 2 refills | Status: DC

## 2019-04-08 MED ORDER — VENLAFAXINE HCL ER 75 MG PO CP24: 75.0000 mg | ORAL_CAPSULE | Freq: Every day | ORAL | 2 refills | Status: DC

## 2019-04-08 MED ORDER — LORAZEPAM 0.5 MG PO TABS: 0.5000 mg | ORAL_TABLET | Freq: Three times a day (TID) | ORAL | 3 refills | Status: DC

## 2019-04-08 MED ORDER — DIVALPROEX SODIUM ER 500 MG PO TB24: 500.0000 mg | ORAL_TABLET | Freq: Every day | ORAL | 2 refills | Status: DC

## 2019-04-08 MED ORDER — TRAZODONE HCL 50 MG PO TABS: 50.0000 mg | ORAL_TABLET | Freq: Every day | ORAL | 2 refills | Status: DC

## 2019-04-08 MED FILL — VENLAFAXINE HCL ER 75 MG CA: 75 | 90 days supply | Qty: 90 | Fill #0

## 2019-04-08 NOTE — Progress Notes (Signed)
Virtual Visit via Telephone Note  I connected with [facility] on 04/08/19 at  1:00 PM EDT by telephone and verified that I am speaking with the correct person using two identifiers.   I discussed the limitations, risks, security and privacy concerns of performing an evaluation and management service by telephone and the availability of in person appointments. I also discussed with the patient that there may be a patient responsible charge related to this service. The patient expressed understanding and agreed to proceed.     I discussed the assessment and treatment plan with the patient. The patient was provided an opportunity to ask questions and all were answered. The patient agreed with the plan and demonstrated an understanding of the instructions.   The patient was advised to call back or seek an in-person evaluation if the symptoms worsen or if the condition fails to improve as anticipated.  I provided 15 minutes of non-face-to-face time during this encounter.   Lawrence Spiller, MD  [facility] MD/PA/NP OP Progress Note  04/08/2019 1:19 PM [facility]  MRN:  [id number]  Chief Complaint:  Chief Complaint    Depression; Anxiety; Follow-up     HPI: This patient is a 63 year old married white male who lives with his wife and 3 children in [place]. 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  The patient presents with his wife today. He is very hard of hearing and it's difficult to get a good history from him but she filled in the gaps. She states that he's had depression for at least one year. He started worrying about his job being discontinued or that [facility] might close about a year ago. He finally decided to retire early in November. He also had some medical problems going on such as loss of vision and loss of hearing. In 2012 he had colon resection for diverticulosis.  Since retiring in November his mental status has declined. He  started staying in bed all the time being unable to function not attending to his ADLs. He stopped eating and lost about 10 pounds. He was not interacting with anyone at all. His primary doctor had put him on Zoloft but it was not helpful. He had been on a low dose of Ativan for years as well. Finally in the end of May his wife brought him to [facility] for evaluation and from there he was transferred to [facility] geropsychiatry unit.  While on the unit he was diagnosed with severe depression. He was started on a number of medicines including Paxil and Effexor, Seroquel trazodone and Depakote. He also had 4 treatments of ECT while in the hospital and2as an outpatient. His last one was in early June.  Since getting out of the hospital he seems to be doing somewhat better. He is getting out of bed every day and doing things around the house and yard. His family went on a trip to a [place] and he refused to go with them. States that he's had a lot of short-term memory loss since having ECT but his wife thinks his mood is much better. He's never had psychotic symptoms such as auditory or visual hallucinations or paranoia but his thoughts were somewhat disorganized. He has never been suicidal or homicidal. He does not drink or use drugs. His wife thinks that he slowly getting better but he still not the person he used to be.   The patient  returns for follow-up after 3 months.  He continues to do well.  He states that his mood is good he does not have any anxiety symptoms and he is sleeping well.  He denies symptoms of depression or suicidal ideation.  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  He recently had a physical and all his labs were good except for slightly elevated glucose. Visit Diagnosis:    ICD-10-CM   1. Bipolar I disorder, most recent episode mixed ([place])  F31.60   2. Severe single current episode of major depressive  disorder, without psychotic features ([place])  F32.2     Past Psychiatric History: Hospitalization in 2015 with no prior psychiatric history  Past Medical History:  Past Medical History:  Diagnosis Date  . Anxiety   . Atherosclerosis of aorta ([place])   . Atrial contractions, premature   . Depression   . Dyslipidemia   . Elevated glucose 11/11/2015  . Hyperlipidemia   . Mild hypertension   . Urinary hesitancy     Past Surgical History:  Procedure Laterality Date  . COLON RESECTION      Family Psychiatric History: see below  Family History:  Family History  Problem Relation Age of Onset  . Stroke Father   . Bipolar disorder Father   . Hypertension Sister   . Depression Sister   . COPD Mother     Social History:  Social History   Socioeconomic History  . Marital status: Married    Spouse name: Not on file  . Number of children: Not on file  . Years of education: Not on file  . Highest education level: Not on file  Occupational History  . Not on file  Social Needs  . Financial resource strain: Not on file  . Food insecurity    Worry: Not on file    Inability: Not on file  . Transportation needs    Medical: Not on file    Non-medical: Not on file  Tobacco Use  . Smoking status: Former Smoker    Packs/day: 0.20    Years: 19.00    Pack years: 3.80    Types: Cigarettes  . Smokeless tobacco: Never Used  Substance and Sexual Activity  . Alcohol use: No    Alcohol/week: 0.0 standard drinks  . Drug use: No  . Sexual activity: Not Currently  Lifestyle  . Physical activity    Days per week: Not on file    Minutes per session: Not on file  . Stress: Not on file  Relationships  . Social Herbalist on phone: Not on file    Gets together: Not on file    Attends religious service: Not on file    Active member of club or organization: Not on file    Attends meetings of clubs or organizations: Not on file    Relationship status: Not on file  Other Topics  Concern  . Not on file  Social History Narrative  . Not on file    Allergies: No Known Allergies  Metabolic Disorder Labs: Lab Results  Component Value Date   HGBA1C 5.6 08/26/2018   No results found for: PROLACTIN Lab Results  Component Value Date   CHOL 171 03/25/2019   TRIG 155 (H) 03/25/2019   HDL 46 03/25/2019   CHOLHDL 3.7 03/25/2019   LDLCALC 98 03/25/2019   [place] 80 08/26/2018   Lab Results  Component Value Date  TSH 1.300 08/26/2018   TSH 1.430 11/11/2015    Therapeutic Level Labs: No results found for: LITHIUM Lab Results  Component Value Date   VALPROATE 41.8 (L) 11/29/2016   VALPROATE 44 (L) 11/11/2015   No components found for:  CBMZ  Current Medications: Current Outpatient Medications  Medication Sig Dispense Refill  . amLODipine (NORVASC) 5 MG tablet Take 1 tablet (5 mg total) by mouth daily. 90 tablet 3  . aspirin EC 81 MG tablet Take 1 tablet (81 mg total) by mouth daily. 90 tablet 3  . atorvastatin (LIPITOR) 80 MG tablet Take 1 tablet (80 mg total) by mouth daily. 90 tablet 3  . divalproex (DEPAKOTE ER) 500 MG 24 hr tablet Take 1 tablet (500 mg total) by mouth at bedtime. 90 tablet 2  . hydrochlorothiazide (HYDRODIURIL) 25 MG tablet Take 0.5-1 tablets (12.5-25 mg total) by mouth daily. 90 tablet 3  . LORazepam (ATIVAN) 0.5 MG tablet Take 1 tablet (0.5 mg total) by mouth 3 (three) times daily. 90 tablet 3  . PARoxetine (PAXIL) 20 MG tablet Take 1 tablet (20 mg total) by mouth at bedtime. 90 tablet 2  . QUEtiapine (SEROQUEL) 50 MG tablet Take 1 tablet (50 mg total) by mouth at bedtime. 90 tablet 2  . tamsulosin (FLOMAX) 0.4 MG CAPS capsule Take 1 capsule (0.4 mg total) by mouth 2 (two) times a day. 180 capsule 1  . traZODone (DESYREL) 50 MG tablet Take 1 tablet (50 mg total) by mouth at bedtime. 90 tablet 2  . venlafaxine XR (EFFEXOR-XR) 75 MG 24 hr capsule Take 1 capsule (75 mg total) by mouth daily with breakfast. 90 capsule 2   No current  facility-administered medications for this visit.      Musculoskeletal: Strength & Muscle Tone: within normal limits Gait & Station: normal Patient leans: N/A  Psychiatric Specialty Exam: Review of Systems  All other systems reviewed and are negative.   There were no vitals taken for this visit.There is no height or weight on file to calculate BMI.  General Appearance: NA  Eye Contact:  NA  Speech:  Clear and Coherent  Volume:  Normal  Mood:  Euthymic  Affect:  NA  Thought Process:  Goal Directed  Orientation:  Full (Time, Place, and Person)  Thought Content: WDL   Suicidal Thoughts:  No  Homicidal Thoughts:  No  Memory:  Immediate;   Good Recent;   Good Remote;   Good  Judgement:  Good  Insight:  Fair  Psychomotor Activity:  Normal  Concentration:  Concentration: Good and Attention Span: Good  Recall:  Good  Fund of Knowledge: Good  Language: Good  Akathisia:  No  Handed:  Right  AIMS (if indicated): not done  Assets:  Communication Skills Desire for Improvement Physical Health Resilience Social Support Talents/Skills  ADL's:  Intact  Cognition: WNL  Sleep:  Good   Screenings: GAD-7     Office Visit from 08/26/2018 in [facility] Visit from 05/26/2018 in [facility]  Total GAD-7 Score  0  3    PHQ2-9     Office Visit from 03/25/2019 in [facility] Office Visit from 12/02/2018 in [facility] Visit from 08/26/2018 in [facility] Visit from 05/26/2018 in [facility] Visit from 04/23/2018 in [facility]  PHQ-2 Total Score  0  0  0  0  0  PHQ-9 Total Score  0  -  0  0  -       Assessment and Plan: This patient is a 63 year old male with a history of severe depression requiring ECT in the past.  He continues to do very well on his current regimen.  He will continue lorazepam 0.5  mg 3 times daily for anxiety, Paxil 20 mg at bedtime for depression, Seroquel 50 mg at bedtime for mood stabilization, trazodone 50 mg at bedtime for sleep, Effexor XR 75 mg daily for depression and Depakote ER 500 mg daily at bedtime for mood stabilization.  He will return to see me in 3 months   Lawrence Spiller, MD 04/08/2019, 1:19 PM

## 2019-04-09 MED FILL — PARoxetine HCL 20 MG TABS: 20 | 90 days supply | Qty: 90 | Fill #0

## 2019-04-14 MED FILL — LORazepam 0.5 MG TABS: 0.5 | 30 days supply | Qty: 90 | Fill #2

## 2019-04-25 MED FILL — traZODone HCL 50 MG TABS: 50 | 90 days supply | Qty: 90 | Fill #0

## 2019-04-27 ENCOUNTER — Other Ambulatory Visit (HOSPITAL_COMMUNITY): Payer: Self-pay | Admitting: Psychiatry

## 2019-04-27 MED ORDER — TRAZODONE HCL 50 MG PO TABS: 50.0000 mg | ORAL_TABLET | Freq: Every day | ORAL | 2 refills | Status: DC

## 2019-05-02 MED FILL — AMLODIPINE BESYLATE 5 MG TA: 5 | 90 days supply | Qty: 90 | Fill #0

## 2019-05-04 ENCOUNTER — Other Ambulatory Visit: Payer: Self-pay | Admitting: *Deleted

## 2019-05-04 DIAGNOSIS — I10 Essential (primary) hypertension: Secondary | ICD-10-CM

## 2019-05-11 MED FILL — ATORVASTATIN 80 MG TABLET: 80 | 90 days supply | Qty: 90 | Fill #1

## 2019-05-18 MED FILL — LORazepam 0.5 MG TABS: 0.5 | 30 days supply | Qty: 90 | Fill #3

## 2019-05-26 ENCOUNTER — Ambulatory Visit (INDEPENDENT_AMBULATORY_CARE_PROVIDER_SITE_OTHER): Payer: 59 | Admitting: Family Medicine

## 2019-05-26 ENCOUNTER — Encounter: Payer: Self-pay | Admitting: Family Medicine

## 2019-05-26 DIAGNOSIS — B9689 Other specified bacterial agents as the cause of diseases classified elsewhere: Secondary | ICD-10-CM

## 2019-05-26 DIAGNOSIS — J069 Acute upper respiratory infection, unspecified: Secondary | ICD-10-CM

## 2019-05-26 MED ORDER — AZITHROMYCIN 250 MG PO TABS: ORAL_TABLET | ORAL | 0 refills | Status: DC

## 2019-05-26 MED FILL — AZITHROMYCIN 250 MG TABLET: 250 | 5 days supply | Qty: 6 | Fill #0

## 2019-05-26 NOTE — Progress Notes (Signed)
Virtual Visit via Telephone Note  I connected with [facility] on 05/26/19 at 1:28 PM by telephone and verified that I am speaking with the correct person using two identifiers. [facility] is currently located at home and nobody is currently with him during this visit. The provider, Loman Brooklyn, FNP is located in their home at time of visit.  I discussed the limitations, risks, security and privacy concerns of performing an evaluation and management service by telephone and the availability of in person appointments. I also discussed with the patient that there may be a patient responsible charge related to this service. The patient expressed understanding and agreed to proceed.  Subjective: PCP: [name], DO  Chief Complaint  Patient presents with  . Cough   Patient complains of a productive cough and head congestion.  Additional symptoms include headache, sneezing, sore throat and diarrhea. Onset of symptoms was 5 days ago, unchanged since that time. He is drinking plenty of fluids. Evaluation to date: none. Treatment to date: Ibuprofen. He does not have a history of asthma, COPD, or allergies. He does not smoke.    ROS: Per HPI  Current Outpatient Medications:  .  amLODipine (NORVASC) 5 MG tablet, Take 1 tablet (5 mg total) by mouth daily., Disp: 90 tablet, Rfl: 3 .  aspirin EC 81 MG tablet, Take 1 tablet (81 mg total) by mouth daily., Disp: 90 tablet, Rfl: 3 .  atorvastatin (LIPITOR) 80 MG tablet, Take 1 tablet (80 mg total) by mouth daily., Disp: 90 tablet, Rfl: 3 .  divalproex (DEPAKOTE ER) 500 MG 24 hr tablet, Take 1 tablet (500 mg total) by mouth at bedtime., Disp: 90 tablet, Rfl: 2 .  hydrochlorothiazide (HYDRODIURIL) 25 MG tablet, Take 0.5-1 tablets (12.5-25 mg total) by mouth daily., Disp: 90 tablet, Rfl: 3 .  LORazepam (ATIVAN) 0.5 MG tablet, Take 1 tablet (0.5 mg total) by mouth 3 (three) times daily., Disp: 90 tablet, Rfl: 3 .  PARoxetine (PAXIL)  20 MG tablet, Take 1 tablet (20 mg total) by mouth at bedtime., Disp: 90 tablet, Rfl: 2 .  QUEtiapine (SEROQUEL) 50 MG tablet, Take 1 tablet (50 mg total) by mouth at bedtime., Disp: 90 tablet, Rfl: 2 .  tamsulosin (FLOMAX) 0.4 MG CAPS capsule, Take 1 capsule (0.4 mg total) by mouth 2 (two) times a day., Disp: 180 capsule, Rfl: 1 .  traZODone (DESYREL) 50 MG tablet, Take 1 tablet (50 mg total) by mouth at bedtime., Disp: 90 tablet, Rfl: 2 .  venlafaxine XR (EFFEXOR-XR) 75 MG 24 hr capsule, Take 1 capsule (75 mg total) by mouth daily with breakfast., Disp: 90 capsule, Rfl: 2  No Known Allergies Past Medical History:  Diagnosis Date  . Anxiety   . Atherosclerosis of aorta ([place])   . Atrial contractions, premature   . Depression   . Dyslipidemia   . Elevated glucose 11/11/2015  . Hyperlipidemia   . Mild hypertension   . Urinary hesitancy     Observations/Objective: A&O  No respiratory distress or wheezing audible over the phone Mood, judgement, and thought processes all WNL  Assessment and Plan: 1. Bacterial upper respiratory infection - Encouraged patient to go get tested for Covid.  He does not feel this is what he has.  I explained how differently Covid presents and highly encourage that he get tested.  Symptom management. - azithromycin (ZITHROMAX Z-PAK) 250 MG tablet; Take 2 tablets (500 mg) PO today, then 1 tablet (250 mg) PO daily  x4 days.  Dispense: 6 tablet; Refill: 0   Follow Up Instructions:  I discussed the assessment and treatment plan with the patient. The patient was provided an opportunity to ask questions and all were answered. The patient agreed with the plan and demonstrated an understanding of the instructions.   The patient was advised to call back or seek an in-person evaluation if the symptoms worsen or if the condition fails to improve as anticipated.  The above assessment and management plan was discussed with the patient. The patient verbalized understanding  of and has agreed to the management plan. Patient is aware to call the clinic if symptoms persist or worsen. Patient is aware when to return to the clinic for a follow-up visit. Patient educated on when it is appropriate to go to the emergency department.   Time call ended: 1:38 PM  I provided 12 minutes of non-face-to-face time during this encounter.  Hendricks Limes, MSN, APRN, FNP-C [facility] 05/26/19

## 2019-05-27 ENCOUNTER — Other Ambulatory Visit: Payer: Self-pay

## 2019-05-27 DIAGNOSIS — Z20822 Contact with and (suspected) exposure to covid-19: Secondary | ICD-10-CM

## 2019-05-29 LAB — NOVEL CORONAVIRUS, NAA: SARS-CoV-2, NAA: NOT DETECTED

## 2019-06-15 MED FILL — LORazepam 0.5 MG TABS: 0.5 | 30 days supply | Qty: 90 | Fill #0

## 2019-06-15 MED FILL — QUETIAPINE FUMARATE 50 MG T: 50 | 90 days supply | Qty: 90 | Fill #1

## 2019-06-18 ENCOUNTER — Other Ambulatory Visit: Payer: Self-pay

## 2019-06-18 ENCOUNTER — Ambulatory Visit (INDEPENDENT_AMBULATORY_CARE_PROVIDER_SITE_OTHER): Payer: 59

## 2019-06-18 ENCOUNTER — Ambulatory Visit: Payer: 59 | Admitting: Family

## 2019-06-18 ENCOUNTER — Encounter: Payer: Self-pay | Admitting: Family

## 2019-06-18 VITALS — BP 148/99 | HR 79 | Temp 97.5°F | Ht 70.0 in | Wt 245.0 lb

## 2019-06-18 DIAGNOSIS — M25511 Pain in right shoulder: Secondary | ICD-10-CM

## 2019-06-18 MED ORDER — BACLOFEN 10 MG PO TABS: 10.0000 mg | ORAL_TABLET | Freq: Three times a day (TID) | ORAL | 0 refills | Status: DC

## 2019-06-18 MED ORDER — DICLOFENAC SODIUM 75 MG PO TBEC: 75.0000 mg | DELAYED_RELEASE_TABLET | Freq: Two times a day (BID) | ORAL | 0 refills | Status: DC

## 2019-06-18 MED FILL — DICLOFENAC SODIUM 75 MG TAB: 75 | 15 days supply | Qty: 30 | Fill #0

## 2019-06-18 MED FILL — BACLOFEN 10 MG TABS: 10 | 10 days supply | Qty: 30 | Fill #0

## 2019-06-18 NOTE — Progress Notes (Signed)
Subjective:    Patient ID: [facility], male    DOB: 22-Jun-1956, 63 y.o.   MRN: [id number]  No chief complaint on file.  PT presents to the office today with right shoulder pain that started 6 days ago after falling into his truck bed. He reports he was getting into his truck bed and his leg "gave away" and fell and landed on his right shoulder. He reports constant pain of 5 out 10. He has taken motrin with mild relief.  Shoulder Pain  The pain is present in the right shoulder. This is a new problem. The current episode started 1 to 4 weeks ago. There has been a history of trauma. The problem occurs intermittently. The problem has been waxing and waning. The quality of the pain is described as aching. The pain is at a severity of 5/10. The pain is moderate. Associated symptoms include a limited range of motion. Pertinent negatives include no numbness or stiffness.      Review of Systems  Musculoskeletal: Positive for arthralgias and joint swelling. Negative for stiffness.  Neurological: Negative for numbness.  All other systems reviewed and are negative.      Objective:   Physical Exam Vitals reviewed.  Constitutional:      General: He is not in acute distress.    Appearance: He is well-developed.  HENT:     Head: Normocephalic.  Eyes:     General:        Right eye: No discharge.        Left eye: No discharge.     Pupils: Pupils are equal, round, and reactive to light.  Neck:     Thyroid: No thyromegaly.  Cardiovascular:     Rate and Rhythm: Normal rate and regular rhythm.     Heart sounds: Normal heart sounds. No murmur.  Pulmonary:     Effort: Pulmonary effort is normal. No respiratory distress.     Breath sounds: Normal breath sounds. No wheezing.  Abdominal:     General: Bowel sounds are normal. There is no distension.     Palpations: Abdomen is soft.     Tenderness: There is no abdominal tenderness.  Musculoskeletal:        General: Tenderness present.   Cervical back: Normal range of motion and neck supple.     Comments: Pain in right shoulder with abduction and rotation  Skin:    General: Skin is warm and dry.     Findings: No erythema or rash.  Neurological:     Mental Status: He is alert and oriented to person, place, and time.     Cranial Nerves: No cranial nerve deficit.     Deep Tendon Reflexes: Reflexes are normal and symmetric.  Psychiatric:        Behavior: Behavior normal.        Thought Content: Thought content normal.        Judgment: Judgment normal.     BP (!) 148/99   Pulse 79   Temp (!) 97.5 F (36.4 C) (Temporal)   Ht 5\' 10"  (1.778 m)   Wt 245 lb (111.1 kg)   SpO2 95%   BMI 35.15 kg/m        Assessment & Plan:  MONTAVIS PRALL comes in today with chief complaint of Shoulder Pain   Diagnosis and orders addressed:  1. Acute pain of right shoulder Rest Ice ROM exercises  No other NSAID's while taking Voltaren If pain continues will need MRI  and referral to ORtho - DG Shoulder Right; Future - diclofenac (VOLTAREN) 75 MG EC tablet; Take 1 tablet (75 mg total) by mouth 2 (two) times daily.  Dispense: 30 tablet; Refill: 0 - baclofen (LIORESAL) 10 MG tablet; Take 1 tablet (10 mg total) by mouth 3 (three) times daily.  Dispense: 30 each; Refill: 0  Evelina Dun, FNP

## 2019-06-18 NOTE — Patient Instructions (Signed)
Shoulder Pain Many things can cause shoulder pain, including:  An injury to the shoulder.  Overuse of the shoulder.  Arthritis. The source of the pain can be:  Inflammation.  An injury to the shoulder joint.  An injury to a tendon, ligament, or bone. Follow these instructions at home: Pay attention to changes in your symptoms. Let your health care provider know about them. Follow these instructions to relieve your pain. If you have a sling:  Wear the sling as told by your health care provider. Remove it only as told by your health care provider.  Loosen the sling if your fingers tingle, become numb, or turn cold and blue.  Keep the sling clean.  If the sling is not waterproof: ? Do not let it get wet. Remove it to shower or bathe.  Move your arm as little as possible, but keep your hand moving to prevent swelling. Managing pain, stiffness, and swelling   If directed, put ice on the painful area: ? Put ice in a plastic bag. ? Place a towel between your skin and the bag. ? Leave the ice on for 20 minutes, 2-3 times per day. Stop applying ice if it does not help with the pain.  Squeeze a soft ball or a foam pad as much as possible. This helps to keep the shoulder from swelling. It also helps to strengthen the arm. General instructions  Take over-the-counter and prescription medicines only as told by your health care provider.  Keep all follow-up visits as told by your health care provider. This is important. Contact a health care provider if:  Your pain gets worse.  Your pain is not relieved with medicines.  New pain develops in your arm, hand, or fingers. Get help right away if:  Your arm, hand, or fingers: ? Tingle. ? Become numb. ? Become swollen. ? Become painful. ? Turn white or blue. Summary  Shoulder pain can be caused by an injury, overuse, or arthritis.  Pay attention to changes in your symptoms. Let your health care provider know about them.   This condition may be treated with a sling, ice, and pain medicines.  Contact your health care provider if the pain gets worse or new pain develops. Get help right away if your arm, hand, or fingers tingle or become numb, swollen, or painful.  Keep all follow-up visits as told by your health care provider. This is important. This information is not intended to replace advice given to you by your health care provider. Make sure you discuss any questions you have with your health care provider. Document Released: 03/28/2005 Document Revised: 12/31/2017 Document Reviewed: 12/31/2017 Elsevier Patient Education  2020 [employer]vier Inc[employer]  

## 2019-06-22 ENCOUNTER — Other Ambulatory Visit (HOSPITAL_COMMUNITY): Payer: Self-pay | Admitting: Psychiatry

## 2019-06-22 MED ORDER — LORAZEPAM 0.5 MG PO TABS: 0.5000 mg | ORAL_TABLET | Freq: Three times a day (TID) | ORAL | 3 refills | Status: DC

## 2019-06-22 MED FILL — HYDROCHLOROTHIAZIDE 25 MG T: 25 | 90 days supply | Qty: 90 | Fill #0

## 2019-06-29 MED FILL — DIVALPROEX SOD ER 500 MG TA: 500 | 90 days supply | Qty: 90 | Fill #0

## 2019-07-01 MED FILL — HYDROCHLOROTHIAZIDE 25 MG T: 25 | 90 days supply | Qty: 90 | Fill #0

## 2019-07-18 MED FILL — LORazepam 0.5 MG TABS: 0.5 | 30 days supply | Qty: 90 | Fill #1

## 2019-07-19 MED FILL — PARoxetine HCL 20 MG TABS: 20 | 90 days supply | Qty: 90 | Fill #1

## 2019-07-26 MED FILL — traZODone HCL 50 MG TABS: 50 | 90 days supply | Qty: 90 | Fill #1

## 2019-07-26 MED FILL — VENLAFAXINE HCL ER 75 MG CA: 75 | 90 days supply | Qty: 90 | Fill #1

## 2019-07-26 MED FILL — AMLODIPINE BESYLATE 5 MG TA: 5 | 90 days supply | Qty: 90 | Fill #1

## 2019-07-27 MED FILL — TAMSULOSIN HCL 0.4 MG CAP: 0.4 | 90 days supply | Qty: 180 | Fill #1

## 2019-08-07 ENCOUNTER — Encounter: Payer: Self-pay | Admitting: Family

## 2019-08-07 ENCOUNTER — Ambulatory Visit: Payer: Medicare HMO | Admitting: Family

## 2019-08-07 ENCOUNTER — Other Ambulatory Visit: Payer: Self-pay

## 2019-08-07 VITALS — BP 137/83 | HR 95 | Temp 97.5°F | Ht 70.0 in | Wt 250.8 lb

## 2019-08-07 DIAGNOSIS — M25511 Pain in right shoulder: Secondary | ICD-10-CM | POA: Diagnosis not present

## 2019-08-07 MED ORDER — METHYLPREDNISOLONE ACETATE 80 MG/ML IJ SUSP
80.0000 mg | Freq: Once | INTRAMUSCULAR | Status: AC
  Administered 2019-08-07: 15:00:00 80 mg via INTRA_ARTICULAR

## 2019-08-07 MED ORDER — LIDOCAINE HCL 1 % IJ SOLN
1.0000 mL | Freq: Once | INTRAMUSCULAR | Status: AC
  Administered 2019-08-07: 1 mL

## 2019-08-07 NOTE — Progress Notes (Signed)
Subjective:    Patient ID: [facility], male    DOB: 07/27/1955, 64 y.o.   MRN: [id number]  Chief Complaint  Patient presents with  . Shoulder Pain    Right shoulder since december    Pt presents to the office today with right shoulder  Pain after falling in December. He had a negative x-ray and has tried diclofenac and baclofen with mild relief.    Shoulder Pain  The pain is present in the right shoulder. This is a new problem. The current episode started more than 1 month ago. There has been a history of trauma. The problem occurs intermittently. The problem has been waxing and waning. The quality of the pain is described as aching. The pain is at a severity of 4/10. The pain is moderate. Associated symptoms include a limited range of motion. Pertinent negatives include no joint swelling, numbness, stiffness or tingling. He has tried NSAIDS for the symptoms. The treatment provided mild relief.    Review of Systems  Musculoskeletal: Negative for stiffness.  Neurological: Negative for tingling and numbness.  All other systems reviewed and are negative.      Objective:   Physical Exam Vitals reviewed.  Constitutional:      General: He is not in acute distress.    Appearance: He is well-developed.  HENT:     Head: Normocephalic.     Right Ear: Tympanic membrane normal.     Left Ear: Tympanic membrane normal.  Eyes:     General:        Right eye: No discharge.        Left eye: No discharge.     Pupils: Pupils are equal, round, and reactive to light.  Neck:     Thyroid: No thyromegaly.  Cardiovascular:     Rate and Rhythm: Normal rate and regular rhythm.     Heart sounds: Normal heart sounds. No murmur.  Pulmonary:     Effort: Pulmonary effort is normal. No respiratory distress.     Breath sounds: Normal breath sounds. No wheezing.  Abdominal:     General: Bowel sounds are normal. There is no distension.     Palpations: Abdomen is soft.     Tenderness: There is no  abdominal tenderness.  Musculoskeletal:        General: No tenderness.     Cervical back: Normal range of motion and neck supple.     Comments: Pain with internal rotation  Skin:    General: Skin is warm and dry.     Findings: No erythema or rash.  Neurological:     Mental Status: He is alert and oriented to person, place, and time.     Cranial Nerves: No cranial nerve deficit.     Deep Tendon Reflexes: Reflexes are normal and symmetric.  Psychiatric:        Behavior: Behavior normal.        Thought Content: Thought content normal.        Judgment: Judgment normal.    rightshoulder prepped with betadine Injected with Marcaine .5% plain and methylprednisolone with 22 guage needle x 1. Patient tolerated well.   BP 137/83   Pulse 95   Temp (!) 97.5 F (36.4 C) (Temporal)   Ht 5\' 10"  (1.778 m)   Wt 250 lb 12.8 oz (113.8 kg)   SpO2 95%   BMI 35.99 kg/m      Assessment & Plan:  [facility] comes in today with chief complaint  of Shoulder Pain (Right shoulder since december )   Diagnosis and orders addressed:  1. Acute pain of right shoulder Rest Ice ROM exercises encouraged Continue diclofenac BID Call office if pain continues as he will need MRI and referral to Ortho - lidocaine (XYLOCAINE) 1 % (with pres) injection 1 mL - methylPREDNISolone acetate (DEPO-MEDROL) injection 80 mg - Ambulatory referral to Physical Therapy   Evelina Dun, FNP

## 2019-08-07 NOTE — Patient Instructions (Signed)
Shoulder Pain Many things can cause shoulder pain, including:  An injury to the shoulder.  Overuse of the shoulder.  Arthritis. The source of the pain can be:  Inflammation.  An injury to the shoulder joint.  An injury to a tendon, ligament, or bone. Follow these instructions at home: Pay attention to changes in your symptoms. Let your health care provider know about them. Follow these instructions to relieve your pain. If you have a sling:  Wear the sling as told by your health care provider. Remove it only as told by your health care provider.  Loosen the sling if your fingers tingle, become numb, or turn cold and blue.  Keep the sling clean.  If the sling is not waterproof: ? Do not let it get wet. Remove it to shower or bathe.  Move your arm as little as possible, but keep your hand moving to prevent swelling. Managing pain, stiffness, and swelling   If directed, put ice on the painful area: ? Put ice in a plastic bag. ? Place a towel between your skin and the bag. ? Leave the ice on for 20 minutes, 2-3 times per day. Stop applying ice if it does not help with the pain.  Squeeze a soft ball or a foam pad as much as possible. This helps to keep the shoulder from swelling. It also helps to strengthen the arm. General instructions  Take over-the-counter and prescription medicines only as told by your health care provider.  Keep all follow-up visits as told by your health care provider. This is important. Contact a health care provider if:  Your pain gets worse.  Your pain is not relieved with medicines.  New pain develops in your arm, hand, or fingers. Get help right away if:  Your arm, hand, or fingers: ? Tingle. ? Become numb. ? Become swollen. ? Become painful. ? Turn white or blue. Summary  Shoulder pain can be caused by an injury, overuse, or arthritis.  Pay attention to changes in your symptoms. Let your health care provider know about  them.  This condition may be treated with a sling, ice, and pain medicines.  Contact your health care provider if the pain gets worse or new pain develops. Get help right away if your arm, hand, or fingers tingle or become numb, swollen, or painful.  Keep all follow-up visits as told by your health care provider. This is important. This information is not intended to replace advice given to you by your health care provider. Make sure you discuss any questions you have with your health care provider. Document Revised: 12/31/2017 Document Reviewed: 12/31/2017 Elsevier Patient Education  2020 Elsevier Inc.  

## 2019-08-17 MED FILL — LORazepam 0.5 MG TABS: 0.5 | 30 days supply | Qty: 90 | Fill #2

## 2019-08-17 MED FILL — ATORVASTATIN 80 MG TABLET: 80 | 90 days supply | Qty: 90 | Fill #0

## 2019-09-14 MED FILL — QUETIAPINE FUMARATE 50 MG T: 50 | 90 days supply | Qty: 90 | Fill #2

## 2019-09-14 MED FILL — LORazepam 0.5 MG TABS: 0.5 | 30 days supply | Qty: 90 | Fill #3

## 2019-09-22 ENCOUNTER — Ambulatory Visit: Payer: 59 | Admitting: Family Medicine

## 2019-10-05 MED FILL — DIVALPROEX SOD ER 500 MG TA: 500 | 90 days supply | Qty: 90 | Fill #1

## 2019-10-05 MED FILL — PARoxetine HCL 20 MG TABS: 20 | 90 days supply | Qty: 90 | Fill #2

## 2019-10-07 ENCOUNTER — Other Ambulatory Visit: Payer: Self-pay

## 2019-10-07 ENCOUNTER — Encounter: Payer: Self-pay | Admitting: Family Medicine

## 2019-10-07 ENCOUNTER — Other Ambulatory Visit: Payer: Self-pay | Admitting: Family Medicine

## 2019-10-07 ENCOUNTER — Ambulatory Visit (INDEPENDENT_AMBULATORY_CARE_PROVIDER_SITE_OTHER): Payer: Medicare HMO | Admitting: Family Medicine

## 2019-10-07 VITALS — BP 136/88 | HR 86 | Temp 98.7°F | Ht 70.0 in | Wt 249.0 lb

## 2019-10-07 DIAGNOSIS — I10 Essential (primary) hypertension: Secondary | ICD-10-CM | POA: Diagnosis not present

## 2019-10-07 DIAGNOSIS — E782 Mixed hyperlipidemia: Secondary | ICD-10-CM | POA: Diagnosis not present

## 2019-10-07 DIAGNOSIS — K909 Intestinal malabsorption, unspecified: Secondary | ICD-10-CM

## 2019-10-07 DIAGNOSIS — N401 Enlarged prostate with lower urinary tract symptoms: Secondary | ICD-10-CM | POA: Diagnosis not present

## 2019-10-07 DIAGNOSIS — R3912 Poor urinary stream: Secondary | ICD-10-CM | POA: Diagnosis not present

## 2019-10-07 DIAGNOSIS — Z23 Encounter for immunization: Secondary | ICD-10-CM | POA: Diagnosis not present

## 2019-10-07 MED ORDER — AMLODIPINE BESYLATE 5 MG PO TABS: 5.0000 mg | ORAL_TABLET | Freq: Every day | ORAL | 3 refills | Status: DC

## 2019-10-07 MED ORDER — TAMSULOSIN HCL 0.4 MG PO CAPS: 0.4000 mg | ORAL_CAPSULE | Freq: Two times a day (BID) | ORAL | 1 refills | Status: DC

## 2019-10-07 MED ORDER — ATORVASTATIN CALCIUM 80 MG PO TABS: 80.0000 mg | ORAL_TABLET | Freq: Every day | ORAL | 3 refills | Status: DC

## 2019-10-07 NOTE — Patient Instructions (Signed)
Mediterranean Diet A Mediterranean diet refers to food and lifestyle choices that are based on the traditions of countries located on the [place] Sea. This way of eating has been shown to help prevent certain conditions and improve outcomes for people who have chronic diseases, like kidney disease and heart disease. What are tips for following this plan? Lifestyle  Cook and eat meals together with your family, when possible.  Drink enough fluid to keep your urine clear or pale yellow.  Be physically active every day. This includes: ? Aerobic exercise like running or swimming. ? Leisure activities like gardening, walking, or housework.  Get 7-8 hours of sleep each night.  If recommended by your health care provider, drink red wine in moderation. This means 1 glass a day for nonpregnant women and 2 glasses a day for men. A glass of wine equals 5 oz (150 mL). Reading food labels  Check the serving size of packaged foods. For foods such as rice and pasta, the serving size refers to the amount of cooked product, not dry.  Check the total fat in packaged foods. Avoid foods that have saturated fat or trans fats.  Check the ingredients list for added sugars, such as corn syrup.   Shopping  At the grocery store, buy most of your food from the areas near the walls of the store. This includes: ? Fresh fruits and vegetables (produce). ? Grains, beans, nuts, and seeds. Some of these may be available in unpackaged forms or large amounts (in bulk). ? Fresh seafood. ? Poultry and eggs. ? Low-fat dairy products.  Buy whole ingredients instead of prepackaged foods.  Buy fresh fruits and vegetables in-season from local farmers markets.  Buy frozen fruits and vegetables in resealable bags.  If you do not have access to quality fresh seafood, buy precooked frozen shrimp or canned fish, such as tuna, salmon, or sardines.  Buy small amounts of raw or cooked vegetables, salads, or olives from  the deli or salad bar at your store.  Stock your pantry so you always have certain foods on hand, such as olive oil, canned tuna, canned tomatoes, rice, pasta, and beans. Cooking  Cook foods with extra-virgin olive oil instead of using butter or other vegetable oils.  Have meat as a side dish, and have vegetables or grains as your main dish. This means having meat in small portions or adding small amounts of meat to foods like pasta or stew.  Use beans or vegetables instead of meat in common dishes like chili or lasagna.  Experiment with different cooking methods. Try roasting or broiling vegetables instead of steaming or sauteing them.  Add frozen vegetables to soups, stews, pasta, or rice.  Add nuts or seeds for added healthy fat at each meal. You can add these to yogurt, salads, or vegetable dishes.  Marinate fish or vegetables using olive oil, lemon juice, garlic, and fresh herbs. Meal planning  Plan to eat 1 vegetarian meal one day each week. Try to work up to 2 vegetarian meals, if possible.  Eat seafood 2 or more times a week.  Have healthy snacks readily available, such as: ? Vegetable sticks with hummus. ? Greek yogurt. ? Fruit and nut trail mix.  Eat balanced meals throughout the week. This includes: ? Fruit: 2-3 servings a day ? Vegetables: 4-5 servings a day ? Low-fat dairy: 2 servings a day ? Fish, poultry, or lean meat: 1 serving a day ? Beans and legumes: 2 or more servings a week ?   Nuts and seeds: 1-2 servings a day ? Whole grains: 6-8 servings a day ? Extra-virgin olive oil: 3-4 servings a day  Limit red meat and sweets to only a few servings a month   What are my food choices?  Mediterranean diet ? Recommended  Grains: Whole-grain pasta. Brown rice. Bulgar wheat. Polenta. Couscous. Whole-wheat bread. Oatmeal. Quinoa.  Vegetables: Artichokes. Beets. Broccoli. Cabbage. Carrots. Eggplant. Green beans. Chard. Kale. Spinach. Onions. Leeks. Peas. Squash.  Tomatoes. Peppers. Radishes.  Fruits: Apples. Apricots. Avocado. Berries. Bananas. Cherries. Dates. Figs. Grapes. Lemons. Melon. Oranges. Peaches. Plums. Pomegranate.  Meats and other protein foods: Beans. Almonds. Sunflower seeds. Pine nuts. Peanuts. Cod. Salmon. Scallops. Shrimp. Tuna. Tilapia. Clams. Oysters. Eggs.  Dairy: Low-fat milk. Cheese. Greek yogurt.  Beverages: Water. Red wine. Herbal tea.  Fats and oils: Extra virgin olive oil. Avocado oil. Grape seed oil.  Sweets and desserts: Greek yogurt with honey. Baked apples. Poached pears. Trail mix.  Seasoning and other foods: Basil. Cilantro. Coriander. Cumin. Mint. Parsley. Sage. Rosemary. Tarragon. Garlic. Oregano. Thyme. Pepper. Balsalmic vinegar. Tahini. Hummus. Tomato sauce. Olives. Mushrooms. ? Limit these  Grains: Prepackaged pasta or rice dishes. Prepackaged cereal with added sugar.  Vegetables: Deep fried potatoes (french fries).  Fruits: Fruit canned in syrup.  Meats and other protein foods: Beef. Pork. Lamb. Poultry with skin. Hot dogs. Bacon.  Dairy: Ice cream. Sour cream. Whole milk.  Beverages: Juice. Sugar-sweetened soft drinks. Beer. Liquor and spirits.  Fats and oils: Butter. Canola oil. Vegetable oil. Beef fat (tallow). Lard.  Sweets and desserts: Cookies. Cakes. Pies. Candy.  Seasoning and other foods: Mayonnaise. Premade sauces and marinades. The items listed may not be a complete list. Talk with your dietitian about what dietary choices are right for you. Summary  The Mediterranean diet includes both food and lifestyle choices.  Eat a variety of fresh fruits and vegetables, beans, nuts, seeds, and whole grains.  Limit the amount of red meat and sweets that you eat.  Talk with your health care provider about whether it is safe for you to drink red wine in moderation. This means 1 glass a day for nonpregnant women and 2 glasses a day for men. A glass of wine equals 5 oz (150 mL). This information  is not intended to replace advice given to you by your health care provider. Make sure you discuss any questions you have with your health care provider. Document Revised: 02/16/2016 Document Reviewed: 02/09/2016 Elsevier Patient Education  2020 Elsevier Inc.  

## 2019-10-07 NOTE — Progress Notes (Signed)
Subjective: CC: Follow-up hypertension, hyperlipidemia PCP: [name], DO [id number] H Bennett is a 64 [place].o. male presenting to clinic today for:  1.  Hypertension with hyperlipidemia Patient reports compliance with amlodipine 5 mg daily, Lipitor 80 mg daily.  No chest pain, shortness of breath, visual disturbance, lower extremity edema.  He is interested in weight loss.  He admits to skipping lunch every day.  He has a breakfast which typically consists of boiled eggs.  In the evening time, he will eat some type of meat with a couple of vegetables.  He describes his vegetables as mashed potatoes and his meats as chicken fried chicken.  He often eats out for supper.  2.  BPH Patient reports compliance with Flomax 0.4 mg twice daily.  He reports good urine output.  3.  Greasy stools Patient reports ongoing greasy stools.  Sometimes he has fecal incontinence which is described as greasy passage of stools when he is got the urge to have a bowel movement but does not quite make it to the toilet.  He has a history of partial colectomy after a complication of a colonoscopy.  Denies any rectal bleeding, diarrhea or constipation.  He still has his gallbladder.   ROS: Per HPI  No Known Allergies Past Medical History:  Diagnosis Date  . Anxiety   . Atherosclerosis of aorta ([place])   . Atrial contractions, premature   . Depression   . Dyslipidemia   . Elevated glucose 11/11/2015  . Hyperlipidemia   . Mild hypertension   . Urinary hesitancy     Current Outpatient Medications:  .  amLODipine (NORVASC) 5 MG tablet, Take 1 tablet (5 mg total) by mouth daily., Disp: 90 tablet, Rfl: 3 .  aspirin EC 81 MG tablet, Take 1 tablet (81 mg total) by mouth daily., Disp: 90 tablet, Rfl: 3 .  atorvastatin (LIPITOR) 80 MG tablet, Take 1 tablet (80 mg total) by mouth daily., Disp: 90 tablet, Rfl: 3 .  baclofen (LIORESAL) 10 MG tablet, Take 1 tablet (10 mg total) by mouth 3 (three) times daily., Disp:  30 each, Rfl: 0 .  divalproex (DEPAKOTE ER) 500 MG 24 hr tablet, Take 1 tablet (500 mg total) by mouth at bedtime., Disp: 90 tablet, Rfl: 2 .  LORazepam (ATIVAN) 0.5 MG tablet, Take 1 tablet (0.5 mg total) by mouth 3 (three) times daily., Disp: 90 tablet, Rfl: 3 .  PARoxetine (PAXIL) 20 MG tablet, Take 1 tablet (20 mg total) by mouth at bedtime., Disp: 90 tablet, Rfl: 2 .  QUEtiapine (SEROQUEL) 50 MG tablet, Take 1 tablet (50 mg total) by mouth at bedtime., Disp: 90 tablet, Rfl: 2 .  tamsulosin (FLOMAX) 0.4 MG CAPS capsule, Take 1 capsule (0.4 mg total) by mouth 2 (two) times a day., Disp: 180 capsule, Rfl: 1 .  traZODone (DESYREL) 50 MG tablet, Take 1 tablet (50 mg total) by mouth at bedtime., Disp: 90 tablet, Rfl: 2 .  venlafaxine XR (EFFEXOR-XR) 75 MG 24 hr capsule, Take 1 capsule (75 mg total) by mouth daily with breakfast., Disp: 90 capsule, Rfl: 2 .  hydrochlorothiazide (HYDRODIURIL) 25 MG tablet, Take 0.5-1 tablets (12.5-25 mg total) by mouth daily. (Patient not taking: Reported on 10/07/2019), Disp: 90 tablet, Rfl: 3 Social History   Socioeconomic History  . Marital status: Married    Spouse name: Not on file  . Number of children: Not on file  . Years of education: Not on file  . Highest education level: Not on  file  Occupational History  . Not on file  Tobacco Use  . Smoking status: Former Smoker    Packs/day: 0.20    Years: 19.00    Pack years: 3.80    Types: Cigarettes  . Smokeless tobacco: Never Used  Substance and Sexual Activity  . Alcohol use: No    Alcohol/week: 0.0 standard drinks  . Drug use: No  . Sexual activity: Not Currently  Other Topics Concern  . Not on file  Social History Narrative  . Not on file   Social Determinants of Health   Financial Resource Strain:   . Difficulty of Paying Living Expenses:   Food Insecurity:   . Worried About Charity fundraiser in the Last Year:   . Arboriculturist in the Last Year:   Transportation Needs:   . Lexicographer (Medical):   Marland Kitchen Lack of Transportation (Non-Medical):   Physical Activity:   . Days of Exercise per Week:   . Minutes of Exercise per Session:   Stress:   . Feeling of Stress :   Social Connections:   . Frequency of Communication with Friends and Family:   . Frequency of Social Gatherings with Friends and Family:   . Attends Religious Services:   . Active Member of Clubs or Organizations:   . Attends Archivist Meetings:   Marland Kitchen Marital Status:   Intimate Partner Violence:   . Fear of Current or Ex-Partner:   . Emotionally Abused:   Marland Kitchen Physically Abused:   . Sexually Abused:    Family History  Problem Relation Age of Onset  . Stroke Father   . Bipolar disorder Father   . Hypertension Sister   . Depression Sister   . COPD Mother     Objective: Office vital signs reviewed. BP 136/88   Pulse 86   Temp 98.7 F (37.1 C) (Temporal)   Ht 5\' 10"  (1.778 m)   Wt 249 lb (112.9 kg)   SpO2 95%   BMI 35.73 kg/m   Physical Examination:  General: Awake, alert, well nourished, No acute distress HEENT: Normal, sclera white, MMM Cardio: regular rate and rhythm, S1S2 heard, no murmurs appreciated Pulm: clear to auscultation bilaterally, no wheezes, rhonchi or rales; normal work of breathing on room air GI: protuberant Extremities: warm, well perfused, No edema, cyanosis or clubbing; +2 pulses bilaterally  Assessment/ Plan: 64 [place].o. male   1. Fatty stools Check fecal fat, TSH.  Plan for referral to gastroenterology pending this result - Fecal fat, qualitative - TSH  2. Morbid obesity ([place]) Refer to nutrition.  Counseled on low-carb, low-fat, low-salt diet.  Mediterranean diet handout provided - TSH - Amb ref to Medical Nutrition Therapy-MNT  3. Essential hypertension Controlled.  Continue current regimen - amLODipine (NORVASC) 5 MG tablet; Take 1 tablet (5 mg total) by mouth daily.  Dispense: 90 tablet; Refill: 3 - Amb ref to Medical Nutrition  Therapy-MNT  4. Mixed hyperlipidemia Lipitor renewed - atorvastatin (LIPITOR) 80 MG tablet; Take 1 tablet (80 mg total) by mouth daily.  Dispense: 90 tablet; Refill: 3 - Amb ref to Medical Nutrition Therapy-MNT  5. Benign prostatic hyperplasia with weak urinary stream Controlled.  Continue Flomax - tamsulosin (FLOMAX) 0.4 MG CAPS capsule; Take 1 capsule (0.4 mg total) by mouth in the morning and at bedtime.  Dispense: 180 capsule; Refill: 1   No orders of the defined types were placed in this encounter.  No orders of the defined types  were placed in this encounter.    [name], DO [place] [phone]

## 2019-10-07 NOTE — Addendum Note (Signed)
Addended byCarrolyn Leigh on: 10/07/2019 04:17 PM   Modules accepted: Orders

## 2019-10-08 ENCOUNTER — Other Ambulatory Visit: Payer: Medicare HMO

## 2019-10-08 DIAGNOSIS — K909 Intestinal malabsorption, unspecified: Secondary | ICD-10-CM | POA: Diagnosis not present

## 2019-10-08 LAB — TSH: TSH: 1.09 u[IU]/mL (ref 0.450–4.500)

## 2019-10-09 LAB — FECAL FAT, QUALITATIVE
Fat Qual Neutral, Stl: NORMAL
Fat Qual Total, Stl: NORMAL

## 2019-10-13 ENCOUNTER — Other Ambulatory Visit: Payer: Self-pay | Admitting: Family Medicine

## 2019-10-13 DIAGNOSIS — K909 Intestinal malabsorption, unspecified: Secondary | ICD-10-CM

## 2019-10-14 ENCOUNTER — Telehealth: Payer: Self-pay | Admitting: Family Medicine

## 2019-10-19 ENCOUNTER — Telehealth (HOSPITAL_COMMUNITY): Payer: Self-pay | Admitting: Psychiatry

## 2019-10-19 ENCOUNTER — Encounter: Payer: Self-pay | Admitting: Nurse Practitioner

## 2019-10-19 MED ORDER — LORAZEPAM 0.5 MG PO TABS: 0.5000 mg | ORAL_TABLET | Freq: Three times a day (TID) | ORAL | 0 refills | Status: DC

## 2019-10-19 MED FILL — AMLODIPINE BESYLATE 5 MG TA: 5 | 90 days supply | Qty: 90 | Fill #2

## 2019-10-19 MED FILL — LORazepam 0.5 MG TABS: 0.5 | 30 days supply | Qty: 90 | Fill #0

## 2019-10-19 MED FILL — VENLAFAXINE HCL ER 75 MG CA: 75 | 90 days supply | Qty: 90 | Fill #2

## 2019-10-19 MED FILL — traZODone HCL 50 MG TABS: 50 | 90 days supply | Qty: 90 | Fill #2

## 2019-10-19 NOTE — Telephone Encounter (Signed)
Ordered refill of lorazepam per request from the pharmacy. Please contact the patient and make follow up appointment with Dr. Harrington Challenger.

## 2019-10-19 NOTE — Telephone Encounter (Signed)
PER PROVIDER DR HISADA: Ordered refill of lorazepam per request from the pharmacy. Please contact the patient and make follow up appointment with Dr. Harrington Challenger.  CALL ATTEMPTED LVM FOR CALL BACK TO SCHEDULE APPOINTMENT

## 2019-10-29 ENCOUNTER — Ambulatory Visit: Payer: Self-pay | Admitting: Physician Assistant

## 2019-10-30 ENCOUNTER — Ambulatory Visit: Payer: Medicare HMO | Admitting: Nurse Practitioner

## 2019-10-30 ENCOUNTER — Encounter: Payer: Self-pay | Admitting: Nurse Practitioner

## 2019-10-30 VITALS — BP 150/96 | HR 100 | Temp 97.5°F | Ht 70.0 in | Wt 250.0 lb

## 2019-10-30 DIAGNOSIS — R198 Other specified symptoms and signs involving the digestive system and abdomen: Secondary | ICD-10-CM | POA: Insufficient documentation

## 2019-10-30 DIAGNOSIS — R159 Full incontinence of feces: Secondary | ICD-10-CM | POA: Diagnosis not present

## 2019-10-30 DIAGNOSIS — R194 Change in bowel habit: Secondary | ICD-10-CM

## 2019-10-30 NOTE — Progress Notes (Signed)
10/30/2019 Lawrence Bennett [id number] 27-Sep-1955   CHIEF COMPLAINT:  "greasy stools"   HISTORY OF PRESENT ILLNESS:   Lawrence Bennett is a 64 year old male with a past medical history significant for anxiety, depression, hypertension, hyperlipidemia, obesity, BPH and diverticular disease s/p laparoscopic sigmoid colectomy by Dr. Alphonsa Overall 2/27/201. He was referred to our office as referred by his PCP Dr. Ronnie Doss for further evaluation for possible fatty stools. He reports having "greasy stools" daily for the past 3 to 4 months. No oily discharge. Stool do not float. No white matter in the stool noticed. He describes stools are not firm but messy. He wipes aggressively after passing a BM and uses a wash cloth to insert in the anus to clean residual stool. His stool is looser at times which results in episodes when he did not get to the bathroom fast enough and soiled himself twice monthly for the past few months. The stool is sticky and stains the sides of the commode after flushing. No black loose stools. No rectal bleeding. No abdominal pain. No abdominal bloat. No weight loss. No fever, sweats or chills. He took Metamucil without improvement.  Fecal fat testing 10/08/2019 was negative. No recent antibiotics. No new medications. He drinks 2 to 4 twelve  ounce cans of diet Pepsis daily. He drinks one cup of coffee daily.  He eats cereal with milk most mornings. He started eating 2 ice cream bars at night about 6 months ago. No known history of lactose intolerance. No other complaints today.   Colonoscopy by Dr. Carol Ada 04/04/2010: 3 hyperplastic polyps removed from the rectosigmoid colon.   CBC Latest Ref Rng & Units 03/25/2019 11/21/2017 03/26/2017  WBC 3.4 - 10.8 x10E3/uL 5.5 5.4 5.9  Hemoglobin 13.0 - 17.7 g/dL 14.7 14.6 13.3  Hematocrit 37.5 - 51.0 % 42.2 42.5 39.6  Platelets 150 - 450 x10E3/uL 283 247 247   CMP Latest Ref Rng & Units 03/25/2019 08/26/2018 11/21/2017  Glucose 65  - 99 mg/dL 161(H) 137(H) 104(H)  BUN 8 - 27 mg/dL 14 11 12   Creatinine 0.76 - 1.27 mg/dL 1.14 1.02 1.04  Sodium 134 - 144 mmol/L 136 138 140  Potassium 3.5 - 5.2 mmol/L 4.2 4.5 4.6  Chloride 96 - 106 mmol/L 99 102 106  CO2 20 - 29 mmol/L 22 20 20   Calcium 8.6 - 10.2 mg/dL 9.2 9.4 9.1  Total Protein 6.0 - 8.5 g/dL 6.3 6.9 6.5  Total Bilirubin 0.0 - 1.2 mg/dL 0.4 0.4 0.4  Alkaline Phos 39 - 117 IU/L 101 105 101  AST 0 - 40 IU/L 19 17 19   ALT 0 - 44 IU/L 20 22 24      Past Medical History:  Diagnosis Date  . Anxiety   . Atherosclerosis of aorta ([place])   . Atrial contractions, premature   . Depression   . Dyslipidemia   . Elevated glucose 11/11/2015  . Hyperlipidemia   . Mild hypertension   . Urinary hesitancy    Past Surgical History:  Procedure Laterality Date  . COLON RESECTION      Social History: Married. Retired. He has 2 sons and 1 daughter. Past smoker. No alcohol or drug use.   Family History: Father had a stroke. Mother with history of COPD. No family history of esophageal, gastric, colon or pancreatic cancer.   No Known Allergies   Outpatient Encounter Medications as of 10/30/2019  Medication Sig  . amLODipine (NORVASC) 5 MG tablet Take 1 tablet (  5 mg total) by mouth daily.  Marland Kitchen aspirin EC 81 MG tablet Take 1 tablet (81 mg total) by mouth daily.  Marland Kitchen atorvastatin (LIPITOR) 80 MG tablet Take 1 tablet (80 mg total) by mouth daily.  . baclofen (LIORESAL) 10 MG tablet Take 1 tablet (10 mg total) by mouth 3 (three) times daily.  . divalproex (DEPAKOTE ER) 500 MG 24 hr tablet Take 1 tablet (500 mg total) by mouth at bedtime.  Marland Kitchen LORazepam (ATIVAN) 0.5 MG tablet Take 1 tablet (0.5 mg total) by mouth 3 (three) times daily.  Marland Kitchen PARoxetine (PAXIL) 20 MG tablet Take 1 tablet (20 mg total) by mouth at bedtime.  Marland Kitchen QUEtiapine (SEROQUEL) 50 MG tablet Take 1 tablet (50 mg total) by mouth at bedtime.  . tamsulosin (FLOMAX) 0.4 MG CAPS capsule Take 1 capsule (0.4 mg total) by mouth in the  morning and at bedtime.  . traZODone (DESYREL) 50 MG tablet Take 1 tablet (50 mg total) by mouth at bedtime.  Marland Kitchen venlafaxine XR (EFFEXOR-XR) 75 MG 24 hr capsule Take 1 capsule (75 mg total) by mouth daily with breakfast.   No facility-administered encounter medications on file as of 10/30/2019.     REVIEW OF SYSTEMS: All other systems reviewed and negative except where noted in the History of Present Illness.   PHYSICAL EXAM: BP (!) 150/96 (Cuff Size: Small)   Pulse 100   Temp (!) 97.5 F (36.4 C)   Ht 5\' 10"  (1.778 m)   Wt 250 lb (113.4 kg)   BMI 35.87 kg/m  General: Well developed  64 year old male in no acute distress. Head: Normocephalic and atraumatic. Eyes:  Sclerae non-icteric, conjunctive pink. Ears: Normal auditory acuity. Mouth: Poor dentition. No ulcers or lesions.  Neck: Supple, no lymphadenopathy or thyromegaly.  Lungs: Clear bilaterally to auscultation without wheezes, crackles or rhonchi. Heart: Regular rate and rhythm. No murmur, rub or gallop appreciated.  Abdomen: Soft, nontender, non distended. No masses. No hepatosplenomegaly. Normoactive bowel sounds x 4 quadrants.  Rectal: No significant hemorrhoids. Solid brown stool in the rectal vault guaiac negative. Peter Congo CMA present during exam.  Musculoskeletal: Symmetrical with no gross deformities. Skin: Warm and dry. No rash or lesions on visible extremities. Extremities: No edema. Neurological: Alert oriented x 4, no focal deficits.  Psychological:  Alert and cooperative. Normal mood and affect.  ASSESSMENT AND PLAN:  26.  64 year old male with change in bowel pattern, softer stools. Normal solid stool guaiac negative on exam today.  Fecal fat test normal. Suspect lactose intolerance.  -Stop all dairy products or use Lactaid with each dairy product -Bacteria probiotic of choice once daily -Patient to call our office if his symptoms worsen -Patient to call me in 2 weeks with update   2. History of sigmoid  resection secondary to diverticular disease/microperforation 2012.  History of hyperplastic polyps  Per colonoscopy 04/2010.  -Colonoscopy recommended, patient declines further screening colonoscopies at this time. However, he agrees to discuss this further with  Dr.  Rush Landmark at the time of his recommended follow up appointment in 1 to 2 months     CC:  Ronnie Doss M, DO

## 2019-10-30 NOTE — Patient Instructions (Signed)
If you are age 65 or older, your body mass index should be between 23-30. Your Body mass index is 35.87 kg/m. If this is out of the aforementioned range listed, please consider follow up with your Primary Care Provider.  If you are age 58 or younger, your body mass index should be between 19-25. Your Body mass index is 35.87 kg/m. If this is out of the aformentioned range listed, please consider follow up with your Primary Care Provider.   Stop all dairy products for 2 weeks or take Lactaid with each dairy product for 2 weeks.  Call [facility] in 2 weeks with an update- [phone]  Use Hardin Negus Bacteria Probiotic 1 capsule daily for 4 weeks.  Thank you for choosing [facility] Noralyn Pick, CRNP

## 2019-10-31 NOTE — Progress Notes (Signed)
Attending Physician's Attestation   I have reviewed the chart.   I agree with the Advanced Practitioner's note, impression, and recommendations with any updates as below.    Rosaelena Kemnitz Mansouraty, MD [facility] Advanced Endoscopy Office # [phone]  

## 2019-11-05 ENCOUNTER — Encounter (HOSPITAL_COMMUNITY): Payer: Self-pay | Admitting: Psychiatry

## 2019-11-05 ENCOUNTER — Telehealth (INDEPENDENT_AMBULATORY_CARE_PROVIDER_SITE_OTHER): Payer: Medicare HMO | Admitting: Psychiatry

## 2019-11-05 ENCOUNTER — Other Ambulatory Visit (HOSPITAL_COMMUNITY): Payer: Self-pay | Admitting: Psychiatry

## 2019-11-05 ENCOUNTER — Other Ambulatory Visit: Payer: Self-pay

## 2019-11-05 DIAGNOSIS — F3160 Bipolar disorder, current episode mixed, unspecified: Secondary | ICD-10-CM

## 2019-11-05 DIAGNOSIS — F322 Major depressive disorder, single episode, severe without psychotic features: Secondary | ICD-10-CM | POA: Diagnosis not present

## 2019-11-05 MED ORDER — QUETIAPINE FUMARATE 50 MG PO TABS: 50.0000 mg | ORAL_TABLET | Freq: Every day | ORAL | 2 refills | Status: DC

## 2019-11-05 MED ORDER — DIVALPROEX SODIUM ER 500 MG PO TB24: 500.0000 mg | ORAL_TABLET | Freq: Every day | ORAL | 2 refills | Status: DC

## 2019-11-05 MED ORDER — VENLAFAXINE HCL ER 75 MG PO CP24: 75.0000 mg | ORAL_CAPSULE | Freq: Every day | ORAL | 2 refills | Status: DC

## 2019-11-05 MED ORDER — PAROXETINE HCL 20 MG PO TABS: 20.0000 mg | ORAL_TABLET | Freq: Every day | ORAL | 2 refills | Status: DC

## 2019-11-05 MED ORDER — TRAZODONE HCL 50 MG PO TABS: 50.0000 mg | ORAL_TABLET | Freq: Every day | ORAL | 2 refills | Status: DC

## 2019-11-05 MED ORDER — LORAZEPAM 0.5 MG PO TABS: 0.5000 mg | ORAL_TABLET | Freq: Three times a day (TID) | ORAL | 2 refills | Status: DC

## 2019-11-05 NOTE — Progress Notes (Signed)
Virtual Visit via Telephone Note  I connected with Lawrence Bennett on 11/05/19 at 10:40 AM EDT by telephone and verified that I am speaking with the correct person using two identifiers.   I discussed the limitations, risks, security and privacy concerns of performing an evaluation and management service by telephone and the availability of in person appointments. I also discussed with the patient that there may be a patient responsible charge related to this service. The patient expressed understanding and agreed to proceed.    I discussed the assessment and treatment plan with the patient. The patient was provided an opportunity to ask questions and all were answered. The patient agreed with the plan and demonstrated an understanding of the instructions.   The patient was advised to call back or seek an in-person evaluation if the symptoms worsen or if the condition fails to improve as anticipated.  I provided 15 minutes of non-face-to-face time during this encounter.   Levonne Spiller, MD  [facility] MD/PA/NP OP Progress Note  11/05/2019 11:09 AM Lawrence Bennett  MRN:  [id number]  Chief Complaint:  Chief Complaint    Depression; Anxiety; Follow-up     HPI:  This patient is a 64 year old married white male who lives with his wife and 3 children in [place]. 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  The patient presents with his wife today. He is very hard of hearing and it's difficult to get a good history from him but she filled in the gaps. She states that he's had depression for at least one year. He started worrying about his job being discontinued or that [name] might close about a year ago. He finally decided to retire early in November. He also had some medical problems going on such as loss of vision and loss of hearing. In 2012 he had colon resection for diverticulosis.  Since retiring in November his mental status has declined. He  started staying in bed all the time being unable to function not attending to his ADLs. He stopped eating and lost about 10 pounds. He was not interacting with anyone at all. His primary doctor had put him on Zoloft but it was not helpful. He had been on a low dose of Ativan for years as well. Finally in the end of May his wife brought him to [name] ED for evaluation and from there he was transferred to [facility] geropsychiatry unit.  While on the unit he was diagnosed with severe depression. He was started on a number of medicines including Paxil and Effexor, Seroquel trazodone and Depakote. He also had 4 treatments of ECT while in the hospital and2as an outpatient. His last one was in early June.  Since getting out of the hospital he seems to be doing somewhat better. He is getting out of bed every day and doing things around the house and yard. His family went on a trip to a [place] and he refused to go with them. States that he's had a lot of short-term memory loss since having ECT but his wife thinks his mood is much better. He's never had psychotic symptoms such as auditory or visual hallucinations or paranoia but his thoughts were somewhat disorganized. He has never been suicidal or homicidal. He does not drink or use drugs. His wife thinks that he slowly getting better but he still not the person he used to be.   The patient returns  for follow-up after 6 months.  He states that overall he is doing well.  He has been having a lot more diarrhea and fatty stools.  He was evaluated by GI and thought to possibly be lack toast intolerant.  None of his labs indicated any other issues and he declined having the current colonoscopy.  He asked about the medications but none of been changed in recent weeks.  He is on a high dose of Lipitor and I suggested he discuss this with his primary doctor as a potential cause.  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  He denies significant anxiety panic attacks or thoughts of self-harm or suicide. Visit Diagnosis:    ICD-10-CM   1. Bipolar I disorder, most recent episode mixed ([place])  F31.60   2. Severe single current episode of major depressive disorder, without psychotic features ([place])  F32.2     Past Psychiatric History: Hospitalization in 2015 with no prior psychiatric history  Past Medical History:  Past Medical History:  Diagnosis Date  . Anxiety   . Atherosclerosis of aorta ([place])   . Atrial contractions, premature   . Depression   . Dyslipidemia   . Elevated glucose 11/11/2015  . Hyperlipidemia   . Mild hypertension   . Urinary hesitancy     Past Surgical History:  Procedure Laterality Date  . COLON RESECTION  2012    Family Psychiatric History: see below  Family History:  Family History  Problem Relation Age of Onset  . Stroke Father   . Bipolar disorder Father   . Hypertension Sister   . Depression Sister   . COPD Mother   . Colon cancer Neg Hx     Social History:  Social History   Socioeconomic History  . Marital status: Married    Spouse name: Not on file  . Number of children: 3  . Years of education: Not on file  . Highest education level: Not on file  Occupational History  . Occupation: retired  Tobacco Use  . Smoking status: Former Smoker    Packs/day: 0.20    Years: 19.00    Pack years: 3.80    Types: Cigarettes  . Smokeless tobacco: Never Used  Substance and Sexual Activity  . Alcohol use: No    Alcohol/week: 0.0 standard drinks  . Drug use: No  . Sexual activity: Not Currently  Other Topics Concern  . Not on file  Social History Narrative  . Not on file   Social Determinants of Health   Financial Resource Strain:   . Difficulty of Paying Living Expenses:   Food Insecurity:   . Worried About Charity fundraiser in the Last Year:   . Arboriculturist in the  Last Year:   Transportation Needs:   . Film/video editor (Medical):   Marland Kitchen Lack of Transportation (Non-Medical):   Physical Activity:   . Days of Exercise per Week:   . Minutes of Exercise per Session:   Stress:   . Feeling of Stress :   Social Connections:   . Frequency of Communication with Friends and Family:   . Frequency of Social Gatherings with Friends and Family:   . Attends Religious Services:   . Active Member of Clubs or Organizations:   . Attends Archivist Meetings:   Marland Kitchen Marital Status:  Allergies: No Known Allergies  Metabolic Disorder Labs: Lab Results  Component Value Date   HGBA1C 5.6 08/26/2018   No results found for: PROLACTIN Lab Results  Component Value Date   CHOL 171 03/25/2019   TRIG 155 (H) 03/25/2019   HDL 46 03/25/2019   CHOLHDL 3.7 03/25/2019   LDLCALC 98 03/25/2019   LDLCALC 80 08/26/2018   Lab Results  Component Value Date   TSH 1.090 10/07/2019   TSH 1.300 08/26/2018    Therapeutic Level Labs: No results found for: LITHIUM Lab Results  Component Value Date   VALPROATE 41.8 (L) 11/29/2016   VALPROATE 44 (L) 11/11/2015   No components found for:  CBMZ  Current Medications: Current Outpatient Medications  Medication Sig Dispense Refill  . amLODipine (NORVASC) 5 MG tablet Take 1 tablet (5 mg total) by mouth daily. 90 tablet 3  . aspirin EC 81 MG tablet Take 1 tablet (81 mg total) by mouth daily. 90 tablet 3  . atorvastatin (LIPITOR) 80 MG tablet Take 1 tablet (80 mg total) by mouth daily. 90 tablet 3  . divalproex (DEPAKOTE ER) 500 MG 24 hr tablet Take 1 tablet (500 mg total) by mouth at bedtime. 90 tablet 2  . LORazepam (ATIVAN) 0.5 MG tablet Take 1 tablet (0.5 mg total) by mouth 3 (three) times daily. 90 tablet 2  . PARoxetine (PAXIL) 20 MG tablet Take 1 tablet (20 mg total) by mouth at bedtime. 90 tablet 2  . QUEtiapine (SEROQUEL) 50 MG tablet Take 1 tablet (50 mg total) by mouth at bedtime. 90 tablet 2  .  tamsulosin (FLOMAX) 0.4 MG CAPS capsule Take 1 capsule (0.4 mg total) by mouth in the morning and at bedtime. 180 capsule 1  . traZODone (DESYREL) 50 MG tablet Take 1 tablet (50 mg total) by mouth at bedtime. 90 tablet 2  . venlafaxine XR (EFFEXOR-XR) 75 MG 24 hr capsule Take 1 capsule (75 mg total) by mouth daily with breakfast. 90 capsule 2   No current facility-administered medications for this visit.     Musculoskeletal: Strength & Muscle Tone: within normal limits Gait & Station: normal Patient leans: N/A  Psychiatric Specialty Exam: Review of Systems  Gastrointestinal: Positive for diarrhea.  All other systems reviewed and are negative.   There were no vitals taken for this visit.There is no height or weight on file to calculate BMI.  General Appearance: NA  Eye Contact:  NA  Speech:  Clear and Coherent  Volume:  Normal  Mood:  Euthymic  Affect:  NA  Thought Process:  Goal Directed  Orientation:  Full (Time, Place, and Person)  Thought Content: WDL   Suicidal Thoughts:  No  Homicidal Thoughts:  No  Memory:  Immediate;   Good Recent;   Good Remote;   Fair  Judgement:  Good  Insight:  Fair  Psychomotor Activity:  Normal  Concentration:  Concentration: Good and Attention Span: Good  Recall:  Good  Fund of Knowledge: Good  Language: Good  Akathisia:  No  Handed:  Right  AIMS (if indicated): not done  Assets:  Communication Skills Desire for Improvement Physical Health Resilience Social Support Talents/Skills  ADL's:  Intact  Cognition: WNL  Sleep:  Good   Screenings: GAD-7     Office Visit from 08/26/2018 in [facility] Visit from 05/26/2018 in [place]  Total GAD-7 Score  0  3    PHQ2-9     Office Visit from 10/07/2019 in  [place] Office Visit from 08/07/2019 in [place] Visit from 03/25/2019 in [place] Visit from  12/02/2018 in [place] Visit from 08/26/2018 in [place]  PHQ-2 Total Score  0  0  0  0  0  PHQ-9 Total Score  0  --  0  --  0       Assessment and Plan: This patient is a 64 year old male with a history of severe depression requiring ECT in the past.  He continues to do well on his current regimen.  He will continue lorazepam 0.5 mg 3 times daily for anxiety, Paxil 20 mg at bedtime for depression, Seroquel 50 mg at bedtime for mood stabilization, trazodone 50 mg at bedtime for sleep, Effexor XR 75 mg daily for depression and Depakote ER 500 mg daily at bedtime for mood stabilization.  He will return to see me in 3 months   Levonne Spiller, MD 11/05/2019, 11:09 AM

## 2019-11-23 MED FILL — LORazepam 0.5 MG TABS: 0.5 | 30 days supply | Qty: 90 | Fill #1

## 2019-11-23 MED FILL — ATORVASTATIN 80 MG TABLET: 80 | 90 days supply | Qty: 90 | Fill #1

## 2019-12-10 ENCOUNTER — Other Ambulatory Visit (INDEPENDENT_AMBULATORY_CARE_PROVIDER_SITE_OTHER): Payer: Medicare HMO

## 2019-12-10 ENCOUNTER — Encounter: Payer: Self-pay | Admitting: Gastroenterology

## 2019-12-10 ENCOUNTER — Ambulatory Visit: Payer: Medicare HMO | Admitting: Gastroenterology

## 2019-12-10 VITALS — BP 152/84 | HR 76 | Ht 70.0 in | Wt 242.0 lb

## 2019-12-10 DIAGNOSIS — Z1211 Encounter for screening for malignant neoplasm of colon: Secondary | ICD-10-CM

## 2019-12-10 DIAGNOSIS — R194 Change in bowel habit: Secondary | ICD-10-CM

## 2019-12-10 DIAGNOSIS — R195 Other fecal abnormalities: Secondary | ICD-10-CM

## 2019-12-10 LAB — CBC
HCT: 44.5 % (ref 39.0–52.0)
Hemoglobin: 14.9 g/dL (ref 13.0–17.0)
MCHC: 33.4 g/dL (ref 30.0–36.0)
MCV: 88.5 fl (ref 78.0–100.0)
Platelets: 264 10*3/uL (ref 150.0–400.0)
RBC: 5.03 Mil/uL (ref 4.22–5.81)
RDW: 15.1 % (ref 11.5–15.5)
WBC: 5.8 10*3/uL (ref 4.0–10.5)

## 2019-12-10 LAB — COMPREHENSIVE METABOLIC PANEL
ALT: 22 U/L (ref 0–53)
AST: 16 U/L (ref 0–37)
Albumin: 4 g/dL (ref 3.5–5.2)
Alkaline Phosphatase: 89 U/L (ref 39–117)
BUN: 14 mg/dL (ref 6–23)
CO2: 26 mEq/L (ref 19–32)
Calcium: 9.2 mg/dL (ref 8.4–10.5)
Chloride: 101 mEq/L (ref 96–112)
Creatinine, Ser: 1.05 mg/dL (ref 0.40–1.50)
GFR: 70.81 mL/min (ref >60.00)
Glucose, Bld: 142 mg/dL — ABNORMAL HIGH (ref 70–99)
Potassium: 3.9 mEq/L (ref 3.5–5.1)
Sodium: 134 mEq/L — ABNORMAL LOW (ref 135–145)
Total Bilirubin: 0.4 mg/dL (ref 0.2–1.2)
Total Protein: 7 g/dL (ref 6.0–8.3)

## 2019-12-10 LAB — IGA: IgA: 186 mg/dL (ref 68–378)

## 2019-12-10 LAB — SEDIMENTATION RATE: Sed Rate: 16 mm/hr (ref 0–20)

## 2019-12-10 NOTE — Progress Notes (Signed)
[facility] VISIT   Primary Care Provider Ronnie Doss Hilton, DO [place] [address] [phone]  Patient Profile: Lawrence Bennett is a 63 y.o. male with a pmh significant for MDD/anxiety, hyperlipidemia, hypertension, BPH, diverticulosis (status post colectomy).  The patient presents to the [facility] for an evaluation and management of problem(s) noted below:  Problem List 1. Loose stools   2. Change in bowel habits   3. Colon cancer screening     History of Present Illness Please see initial consultation note by NP [facility] for full details of HPI.  Interval History The patient returns for scheduled follow-up.  The patient has tried some of the recommendations by NP [facility] recently.  However he only just recently started probiotics.  He also has not tried [employer] as of yet.  He describes still having issues in regards to straining at times.  No blood in the stools.  Still not absolutely interested in a colonoscopy but understands potential role of it.  Patient denies any significant abdominal pain.  He still concerned about the potential for fecal soilage of things go too quickly.  Incomplete evacuation continues to be an issue.  He tried to abstain from all dairy products during the last few weeks but has not seen too significant of an improvement.  He wonders if any of his medications could be causing issues as a result of them having changes in brand name to generic.  GI Review of Systems Positive as above Negative for dysphagia, odynophagia, pain, nausea, vomiting, melena, hematochezia  Review of Systems General: Denies fevers/chills/weight loss unintentionally HEENT: Denies oral lesions Cardiovascular: Denies chest pain/palpitations Pulmonary: Denies shortness of breath Gastroenterological: See HPI Genitourinary: Denies darkened urine Hematological: Denies easy bruising/bleeding Endocrine: Denies  temperature intolerance Dermatological: Denies jaundice Psychological: Mood is stable   Medications Current Outpatient Medications  Medication Sig Dispense Refill  . amLODipine (NORVASC) 5 MG tablet Take 1 tablet (5 mg total) by mouth daily. 90 tablet 3  . aspirin EC 81 MG tablet Take 1 tablet (81 mg total) by mouth daily. 90 tablet 3  . atorvastatin (LIPITOR) 80 MG tablet Take 1 tablet (80 mg total) by mouth daily. 90 tablet 3  . divalproex (DEPAKOTE ER) 500 MG 24 hr tablet Take 1 tablet (500 mg total) by mouth at bedtime. 90 tablet 2  . LORazepam (ATIVAN) 0.5 MG tablet Take 1 tablet (0.5 mg total) by mouth 3 (three) times daily. 90 tablet 2  . PARoxetine (PAXIL) 20 MG tablet Take 1 tablet (20 mg total) by mouth at bedtime. 90 tablet 2  . QUEtiapine (SEROQUEL) 50 MG tablet Take 1 tablet (50 mg total) by mouth at bedtime. 90 tablet 2  . tamsulosin (FLOMAX) 0.4 MG CAPS capsule Take 1 capsule (0.4 mg total) by mouth in the morning and at bedtime. 180 capsule 1  . traZODone (DESYREL) 50 MG tablet Take 1 tablet (50 mg total) by mouth at bedtime. 90 tablet 2  . venlafaxine XR (EFFEXOR-XR) 75 MG 24 hr capsule Take 1 capsule (75 mg total) by mouth daily with breakfast. 90 capsule 2   No current facility-administered medications for this visit.    Allergies No Known Allergies  Histories Past Medical History:  Diagnosis Date  . Anxiety   . Atherosclerosis of aorta ([place])   . Atrial contractions, premature   . Depression   . Dyslipidemia   . Elevated glucose 11/11/2015  . Hyperlipidemia   . Mild hypertension   .  Urinary hesitancy    Past Surgical History:  Procedure Laterality Date  . COLON RESECTION  2012   Social History   Socioeconomic History  . Marital status: Married    Spouse name: Not on file  . Number of children: 3  . Years of education: Not on file  . Highest education level: Not on file  Occupational History  . Occupation: retired  Tobacco Use  . Smoking status:  Former Smoker    Packs/day: 0.20    Years: 19.00    Pack years: 3.80    Types: Cigarettes  . Smokeless tobacco: Never Used  Vaping Use  . Vaping Use: Never used  Substance and Sexual Activity  . Alcohol use: No    Alcohol/week: 0.0 standard drinks  . Drug use: No  . Sexual activity: Not Currently  Other Topics Concern  . Not on file  Social History Narrative  . Not on file   Social Determinants of Health   Financial Resource Strain:   . Difficulty of Paying Living Expenses:   Food Insecurity:   . Worried About Charity fundraiser in the Last Year:   . Arboriculturist in the Last Year:   Transportation Needs:   . Film/video editor (Medical):   Marland Kitchen Lack of Transportation (Non-Medical):   Physical Activity:   . Days of Exercise per Week:   . Minutes of Exercise per Session:   Stress:   . Feeling of Stress :   Social Connections:   . Frequency of Communication with Friends and Family:   . Frequency of Social Gatherings with Friends and Family:   . Attends Religious Services:   . Active Member of Clubs or Organizations:   . Attends Archivist Meetings:   Marland Kitchen Marital Status:   Intimate Partner Violence:   . Fear of Current or Ex-Partner:   . Emotionally Abused:   Marland Kitchen Physically Abused:   . Sexually Abused:    Family History  Problem Relation Age of Onset  . Stroke Father   . Bipolar disorder Father   . Hypertension Sister   . Depression Sister   . COPD Mother   . Colon cancer Neg Hx   . Esophageal cancer Neg Hx   . Inflammatory bowel disease Neg Hx   . Liver disease Neg Hx   . Rectal cancer Neg Hx   . Pancreatic cancer Neg Hx   . Stomach cancer Neg Hx    I have reviewed his medical, social, and family history in detail and updated the electronic medical record as necessary.    PHYSICAL EXAMINATION  BP (!) 152/84   Pulse 76   Ht '5\' 10"'$  (1.778 m)   Wt 242 lb (109.8 kg)   BMI 34.72 kg/m  Wt Readings from Last 3 Encounters:  12/10/19 242 lb  (109.8 kg)  10/30/19 250 lb (113.4 kg)  10/07/19 249 lb (112.9 kg)  GEN: NAD, appears stated age, doesn't appear chronically ill PSYCH: Cooperative, without pressured speech EYE: Conjunctivae pink, sclerae anicteric ENT: MMM CV: Nontachycardic RESP: No audible wheezing GI: NABS, soft, NT/ND, surgical scars present, no rebound MSK/EXT: No lower extremity edema SKIN: No jaundice NEURO:  Alert & Oriented x 3, no focal deficits   REVIEW OF DATA  I reviewed the following data at the time of this encounter:  GI Procedures and Studies  Previously reviewed  Laboratory Studies  Reviewed those in epic  Imaging Studies  No new relevant studies to review  ASSESSMENT  Mr. Stroupe is a 64 y.o. male with a pmh significant for MDD/anxiety, hyperlipidemia, hypertension, BPH, diverticulosis (status post colectomy).  The patient is seen today for evaluation and management of:  1. Loose stools   2. Change in bowel habits   3. Colon cancer screening    The patient is hemodynamically stable.  Clinically has not had too significant of a change however has only been on probiotics for about a week.  I am asking the patient to go ahead since he has no other red flag symptoms currently and is still somewhat hesitant about a colonoscopy for [place] to try a few more weeks of probiotics but also initiate FiberCon for bulking purposes.  The patient continues to have issues and we will need to move forward with a diagnostic colonoscopy.  The patient will also undergo stool studies as well as laboratory evaluation to rule out other etiologies for his changes in bowel habits at this time.  All patient questions were answered, to the best of my ability, and the patient agrees to the aforementioned plan of action with follow-up as indicated.   PLAN  Laboratories as outlined below Stool studies as outlined below Initiate FiberCon 1-2 tabs daily Continue probiotics for the next month Follow-up in approximately 4  weeks and if patient is still having symptoms then we will proceed with a diagnostic colonoscopy/endoscopy   Orders Placed This Encounter  Procedures  . Stool Culture  . Ova and parasite examination  . Fecal leukocytes  . CBC  . Comp Met (CMET)  . Sedimentation rate  . IgA  . Tissue transglutaminase, IgA  . Clostridium difficile Toxin B, Qualitative, Real-Time PCR  . Pancreatic Elastase, Fecal    New Prescriptions   No medications on file   Modified Medications   No medications on file    Planned Follow Up No follow-ups on file.   Total Time in Face-to-Face and in Coordination of Care for patient including independent/personal interpretation/review of prior testing, medical history, examination, medication adjustment, communicating results with the patient directly, and documentation with the EHR is 30 minutes.   Justice Britain, MD [facility] Advanced Endoscopy Office # [phone]

## 2019-12-10 NOTE — Patient Instructions (Addendum)
  Your provider has requested that you go to the basement level for lab work before leaving today. Press "B" on the elevator. The lab is located at the first door on the left as you exit the elevator.  Toileting tips to help with your constipation - Drink at least 64-80 ounces of water/liquid per day. - Establish a time to try to move your bowels every day.  For many people, this is after a cup of coffee or after a meal such as breakfast. - Sit all of the way back on the toilet keeping your back fairly straight and while sitting up, try to rest the tops of your forearms on your upper thighs.   - Raising your feet with a step stool/squatty potty can be helpful to improve the angle that allows your stool to pass through the rectum. - Relax the rectum feeling it bulge toward the toilet water.  If you feel your rectum raising toward your body, you are contracting rather than relaxing. - Breathe in and slowly exhale. "Belly breath" by expanding your belly towards your belly button. Keep belly expanded as you gently direct pressure down and back to the anus.  A low pitched GRRR sound can assist with increasing intra-abdominal pressure.  - Repeat 3-4 times. If unsuccessful, contract the pelvic floor to restore normal tone and get off the toilet.  Avoid excessive straining. - To reduce excessive wiping by teaching your anus to normally contract, place hands on outer aspect of knees and resist knee movement outward.  Hold 5-10 second then place hands just inside of knees and resist inward movement of knees.  Hold 5 seconds.  Repeat a few times each way.  Start Fiber Con 1-2 times daily.   Continue Probiotics for the next few weeks.   Follow- up in 1 month.   Thank you for choosing me and [facility].  Dr. Rush Landmark

## 2019-12-11 LAB — TISSUE TRANSGLUTAMINASE, IGA: (tTG) Ab, IgA: 1 U/mL

## 2019-12-13 ENCOUNTER — Encounter: Payer: Self-pay | Admitting: Gastroenterology

## 2019-12-13 DIAGNOSIS — Z1211 Encounter for screening for malignant neoplasm of colon: Secondary | ICD-10-CM | POA: Insufficient documentation

## 2019-12-13 DIAGNOSIS — R194 Change in bowel habit: Secondary | ICD-10-CM | POA: Insufficient documentation

## 2019-12-13 DIAGNOSIS — R195 Other fecal abnormalities: Secondary | ICD-10-CM | POA: Insufficient documentation

## 2019-12-14 ENCOUNTER — Encounter: Payer: Medicare HMO | Admitting: Family Medicine

## 2019-12-14 NOTE — Progress Notes (Deleted)
Subjective:   Lawrence Bennett is a 64 y.o. male who presents for a Welcome to Medicare exam.   Review of Systems: ***       Objective:    There were no vitals filed for this visit. There is no height or weight on file to calculate BMI.  Medications Outpatient Encounter Medications as of 12/14/2019  Medication Sig  . amLODipine (NORVASC) 5 MG tablet Take 1 tablet (5 mg total) by mouth daily.  Marland Kitchen aspirin EC 81 MG tablet Take 1 tablet (81 mg total) by mouth daily.  Marland Kitchen atorvastatin (LIPITOR) 80 MG tablet Take 1 tablet (80 mg total) by mouth daily.  . divalproex (DEPAKOTE ER) 500 MG 24 hr tablet Take 1 tablet (500 mg total) by mouth at bedtime.  Marland Kitchen LORazepam (ATIVAN) 0.5 MG tablet Take 1 tablet (0.5 mg total) by mouth 3 (three) times daily.  Marland Kitchen PARoxetine (PAXIL) 20 MG tablet Take 1 tablet (20 mg total) by mouth at bedtime.  Marland Kitchen QUEtiapine (SEROQUEL) 50 MG tablet Take 1 tablet (50 mg total) by mouth at bedtime.  . tamsulosin (FLOMAX) 0.4 MG CAPS capsule Take 1 capsule (0.4 mg total) by mouth in the morning and at bedtime.  . traZODone (DESYREL) 50 MG tablet Take 1 tablet (50 mg total) by mouth at bedtime.  Marland Kitchen venlafaxine XR (EFFEXOR-XR) 75 MG 24 hr capsule Take 1 capsule (75 mg total) by mouth daily with breakfast.   No facility-administered encounter medications on file as of 12/14/2019.     History: Past Medical History:  Diagnosis Date  . Anxiety   . Atherosclerosis of aorta ([place])   . Atrial contractions, premature   . Depression   . Dyslipidemia   . Elevated glucose 11/11/2015  . Hyperlipidemia   . Mild hypertension   . Urinary hesitancy    Past Surgical History:  Procedure Laterality Date  . COLON RESECTION  2012    Family History  Problem Relation Age of Onset  . Stroke Father   . Bipolar disorder Father   . Hypertension Sister   . Depression Sister   . COPD Mother   . Colon cancer Neg Hx   . Esophageal cancer Neg Hx   . Inflammatory bowel disease Neg Hx   . Liver  disease Neg Hx   . Rectal cancer Neg Hx   . Pancreatic cancer Neg Hx   . Stomach cancer Neg Hx    Social History   Occupational History  . Occupation: retired  Tobacco Use  . Smoking status: Former Smoker    Packs/day: 0.20    Years: 19.00    Pack years: 3.80    Types: Cigarettes  . Smokeless tobacco: Never Used  Vaping Use  . Vaping Use: Never used  Substance and Sexual Activity  . Alcohol use: No    Alcohol/week: 0.0 standard drinks  . Drug use: No  . Sexual activity: Not Currently   Tobacco Counseling Counseling given: Not Answered   Immunizations and Health Maintenance Immunization History  Administered Date(s) Administered  . Influenza Inj Mdck Quad Pf 03/24/2018  . Influenza,inj,Quad PF,6+ Mos 03/23/2016, 03/26/2017, 03/26/2019  . Pneumococcal Conjugate-13 05/26/2018  . Pneumococcal Polysaccharide-23 10/07/2019  . Tdap 04/23/2018   Health Maintenance Due  Topic Date Due  . COVID-19 Vaccine (1) Never done    Activities of Daily Living No flowsheet data found.  Physical Exam  ***(optional), or other factors deemed appropriate based on the beneficiary's medical and social history and current clinical standards.  Advanced Directives:      Assessment:    This is a routine wellness  examination for this patient . ***  Vision/Hearing screen No exam data present  Dietary issues and exercise activities discussed:     Goals   None     Depression Screen PHQ 2/9 Scores 10/07/2019 08/07/2019 03/25/2019 12/02/2018  PHQ - 2 Score 0 0 0 0  PHQ- 9 Score 0 - 0 -     Fall Risk Fall Risk  08/07/2019  Falls in the past year? 0  Number falls in past yr: -  Injury with Fall? -  Comment -  Follow up -    Cognitive Function        Patient Care Team: [name], DO as PCP - General (Family Medicine)     Plan:   ***  I have personally reviewed and noted the following in the patient's chart:   . Medical and social history . Use of alcohol,  tobacco or illicit drugs  . Current medications and supplements . Functional ability and status . Nutritional status . Physical activity . Advanced directives . List of other physicians . Hospitalizations, surgeries, and ER visits in previous 12 months . Vitals . Screenings to include cognitive, depression, and falls . Referrals and appointments  In addition, I have reviewed and discussed with patient certain preventive protocols, quality metrics, and best practice recommendations. A written personalized care plan for preventive services as well as general preventive health recommendations were provided to patient.    Ronnie Doss, DO 12/14/2019

## 2019-12-15 ENCOUNTER — Other Ambulatory Visit: Payer: Medicare HMO

## 2019-12-15 DIAGNOSIS — Z1211 Encounter for screening for malignant neoplasm of colon: Secondary | ICD-10-CM | POA: Diagnosis not present

## 2019-12-15 DIAGNOSIS — R195 Other fecal abnormalities: Secondary | ICD-10-CM | POA: Diagnosis not present

## 2019-12-15 DIAGNOSIS — R194 Change in bowel habit: Secondary | ICD-10-CM

## 2019-12-16 ENCOUNTER — Encounter: Payer: Self-pay | Admitting: Family Medicine

## 2019-12-18 LAB — SPECIMEN STATUS REPORT

## 2019-12-22 LAB — FECAL LEUKOCYTES

## 2019-12-23 ENCOUNTER — Ambulatory Visit: Payer: Self-pay | Admitting: Nutrition

## 2019-12-23 LAB — PANCREATIC ELASTASE, FECAL: Pancreatic Elastase-1, Stool: >500 mcg/g

## 2019-12-23 LAB — STOOL CULTURE
MICRO NUMBER:: 10593035
MICRO NUMBER:: 10593036
MICRO NUMBER:: 10593038
SHIGA RESULT:: NOT DETECTED
SPECIMEN QUALITY:: ADEQUATE
SPECIMEN QUALITY:: ADEQUATE
SPECIMEN QUALITY:: ADEQUATE

## 2019-12-23 LAB — OVA AND PARASITE EXAMINATION
CONCENTRATE RESULT:: NONE SEEN
MICRO NUMBER:: 10593037
SPECIMEN QUALITY:: ADEQUATE
TRICHROME RESULT:: NONE SEEN

## 2019-12-23 LAB — CLOSTRIDIUM DIFFICILE TOXIN B, QUALITATIVE, REAL-TIME PCR: Toxigenic C. Difficile by PCR: NOT DETECTED

## 2019-12-26 ENCOUNTER — Other Ambulatory Visit (HOSPITAL_COMMUNITY): Payer: Self-pay | Admitting: Psychiatry

## 2019-12-28 ENCOUNTER — Other Ambulatory Visit (HOSPITAL_COMMUNITY): Payer: Self-pay | Admitting: Psychiatry

## 2019-12-28 MED ORDER — LORAZEPAM 0.5 MG PO TABS: 0.5000 mg | ORAL_TABLET | Freq: Three times a day (TID) | ORAL | 3 refills | Status: DC

## 2019-12-28 MED FILL — LORazepam 0.5 MG TABS: 0.5 | 30 days supply | Qty: 90 | Fill #0

## 2019-12-28 NOTE — Telephone Encounter (Signed)
Call pt for appt 

## 2019-12-29 ENCOUNTER — Encounter (HOSPITAL_COMMUNITY): Payer: Self-pay | Admitting: *Deleted

## 2019-12-29 NOTE — Telephone Encounter (Signed)
LMOM

## 2020-01-02 MED FILL — DIVALPROEX SOD ER 500 MG TA: 500 | 90 days supply | Qty: 90 | Fill #2

## 2020-01-16 MED FILL — PARoxetine HCL 20 MG TABS: 20 | 90 days supply | Qty: 90 | Fill #0

## 2020-01-18 ENCOUNTER — Other Ambulatory Visit (HOSPITAL_COMMUNITY): Payer: Self-pay | Admitting: Psychiatry

## 2020-01-18 MED ORDER — PAROXETINE HCL 20 MG PO TABS: 20.0000 mg | ORAL_TABLET | Freq: Every day | ORAL | 2 refills | Status: DC

## 2020-01-20 ENCOUNTER — Ambulatory Visit: Payer: Medicare HMO | Admitting: Nurse Practitioner

## 2020-01-21 ENCOUNTER — Ambulatory Visit: Payer: Self-pay | Admitting: Nutrition

## 2020-01-22 DIAGNOSIS — I10 Essential (primary) hypertension: Secondary | ICD-10-CM | POA: Diagnosis not present

## 2020-01-22 DIAGNOSIS — H6980 Other specified disorders of Eustachian tube, unspecified ear: Secondary | ICD-10-CM | POA: Diagnosis not present

## 2020-01-22 DIAGNOSIS — H918X9 Other specified hearing loss, unspecified ear: Secondary | ICD-10-CM | POA: Diagnosis not present

## 2020-01-22 DIAGNOSIS — H9202 Otalgia, left ear: Secondary | ICD-10-CM | POA: Diagnosis not present

## 2020-01-22 MED FILL — NEO/POLYMYXIN/DEXAMETH DROP: 3.5-10000-0 | 12 days supply | Qty: 5 | Fill #0

## 2020-01-23 MED FILL — traZODone HCL 50 MG TABS: 50 | 90 days supply | Qty: 90 | Fill #0

## 2020-01-24 ENCOUNTER — Other Ambulatory Visit: Payer: Self-pay

## 2020-01-24 ENCOUNTER — Encounter (HOSPITAL_BASED_OUTPATIENT_CLINIC_OR_DEPARTMENT_OTHER): Payer: Self-pay | Admitting: Emergency Medicine

## 2020-01-24 DIAGNOSIS — Z7982 Long term (current) use of aspirin: Secondary | ICD-10-CM | POA: Diagnosis not present

## 2020-01-24 DIAGNOSIS — I10 Essential (primary) hypertension: Secondary | ICD-10-CM | POA: Insufficient documentation

## 2020-01-24 DIAGNOSIS — H6122 Impacted cerumen, left ear: Secondary | ICD-10-CM | POA: Diagnosis not present

## 2020-01-24 DIAGNOSIS — F419 Anxiety disorder, unspecified: Secondary | ICD-10-CM | POA: Insufficient documentation

## 2020-01-24 DIAGNOSIS — H9391 Unspecified disorder of right ear: Secondary | ICD-10-CM | POA: Diagnosis present

## 2020-01-24 DIAGNOSIS — Z87891 Personal history of nicotine dependence: Secondary | ICD-10-CM | POA: Diagnosis not present

## 2020-01-24 DIAGNOSIS — H6121 Impacted cerumen, right ear: Secondary | ICD-10-CM | POA: Diagnosis not present

## 2020-01-24 DIAGNOSIS — E785 Hyperlipidemia, unspecified: Secondary | ICD-10-CM | POA: Insufficient documentation

## 2020-01-24 DIAGNOSIS — H60311 Diffuse otitis externa, right ear: Secondary | ICD-10-CM | POA: Diagnosis not present

## 2020-01-24 DIAGNOSIS — F329 Major depressive disorder, single episode, unspecified: Secondary | ICD-10-CM | POA: Insufficient documentation

## 2020-01-24 DIAGNOSIS — H9191 Unspecified hearing loss, right ear: Secondary | ICD-10-CM | POA: Diagnosis not present

## 2020-01-24 DIAGNOSIS — N401 Enlarged prostate with lower urinary tract symptoms: Secondary | ICD-10-CM | POA: Diagnosis not present

## 2020-01-24 NOTE — ED Triage Notes (Addendum)
Patient has a hx of hearing loss to his left ear. The patient states that he is now having hearing loss to both ears, and both are in pain

## 2020-01-25 ENCOUNTER — Other Ambulatory Visit (HOSPITAL_COMMUNITY): Payer: Self-pay | Admitting: Psychiatry

## 2020-01-25 ENCOUNTER — Emergency Department (HOSPITAL_BASED_OUTPATIENT_CLINIC_OR_DEPARTMENT_OTHER)
Admission: EM | Admit: 2020-01-25 | Discharge: 2020-01-25 | Disposition: A | Discharge status: Home / Self Care | Payer: Medicare HMO | Attending: Emergency Medicine | Admitting: Emergency Medicine

## 2020-01-25 DIAGNOSIS — H9190 Unspecified hearing loss, unspecified ear: Secondary | ICD-10-CM

## 2020-01-25 DIAGNOSIS — I10 Essential (primary) hypertension: Secondary | ICD-10-CM

## 2020-01-25 DIAGNOSIS — H6122 Impacted cerumen, left ear: Secondary | ICD-10-CM

## 2020-01-25 DIAGNOSIS — H60311 Diffuse otitis externa, right ear: Secondary | ICD-10-CM

## 2020-01-25 MED ORDER — TRAZODONE HCL 50 MG PO TABS: 50.0000 mg | ORAL_TABLET | Freq: Every day | ORAL | 2 refills | Status: DC

## 2020-01-25 MED ORDER — CIPROFLOXACIN HCL 500 MG PO TABS
500.0000 mg | ORAL_TABLET | Freq: Two times a day (BID) | ORAL | 0 refills | Status: DC
Start: 2020-01-25 — End: 2020-08-17

## 2020-01-25 MED ORDER — CIPROFLOXACIN HCL 500 MG PO TABS
500.0000 mg | ORAL_TABLET | Freq: Once | ORAL | Status: AC
  Administered 2020-01-25: 500 mg via ORAL
  Filled 2020-01-25: qty 1

## 2020-01-25 MED FILL — CIPROFLOXACIN HCL 500 MG TA: 500 | 7 days supply | Qty: 14 | Fill #0

## 2020-01-25 NOTE — Discharge Instructions (Addendum)
It was our pleasure to provide your ER care today - we hope that you feel better.  Continue the antibiotic ear drops.   Take the oral antibiotic (cipro) as prescribed.  Follow up with ENT doctor in the coming week - call office tomorrow AM to arrange appointment.   Your blood pressure is high - continue your blood pressure medication, limit salt intake, and follow up with primary care doctor in the coming week for recheck.   Return to ER if worse, severe ear pain, severe headache, high fevers, or other concern.

## 2020-01-25 NOTE — ED Notes (Signed)
Pt refused DC vitals. SIgnature pad broken, pt was given DC paperwork and all questions were answered. Pt and family member verbalized understanding.

## 2020-01-25 NOTE — ED Provider Notes (Signed)
[facility] Provider Note   CSN: [id number] Arrival date & time: 01/24/20  2031     History Chief Complaint  Patient presents with  . Ear Fullness    Lawrence Bennett is a 64 y.o. male.  Patient with decrease hearing and ear pain/fullness for the past several days. Symptoms gradual onset, constant, moderate, slowly worse. No tinnitus. +hearing loss. States chronic loss of hearing on left, but now hearing on right also seems impaired. Denies trauma to ears. No drainage. No headache. No fever or chills. Notes remote hx cerumen impaction.   The history is provided by the patient and the spouse.  Ear Fullness Pertinent negatives include no headaches.       Past Medical History:  Diagnosis Date  . Anxiety   . Atherosclerosis of aorta ([place])   . Atrial contractions, premature   . Depression   . Dyslipidemia   . Elevated glucose 11/11/2015  . Hyperlipidemia   . Mild hypertension   . Urinary hesitancy     Patient Active Problem List   Diagnosis Date Noted  . Loose stools 12/13/2019  . Change in bowel habits 12/13/2019  . Colon cancer screening 12/13/2019  . Altered bowel function 10/30/2019  . Onychomycosis of toenail 08/26/2018  . Pre-diabetes 07/25/2017  . History of stroke 03/23/2016  . HLD (hyperlipidemia) 12/20/2015  . BPH (benign prostatic hyperplasia) 11/11/2015  . Healthcare maintenance 11/11/2015  . Major depressive disorder, recurrent episode, moderate ([place])   . Diverticulosis of colon without hemorrhage 05/02/2014  . Essential hypertension 04/15/2014  . Anxiety 04/15/2014  . Hearing loss 04/15/2014  . Dizziness 04/15/2014    Past Surgical History:  Procedure Laterality Date  . COLON RESECTION  2012       Family History  Problem Relation Age of Onset  . Stroke Father   . Bipolar disorder Father   . Hypertension Sister   . Depression Sister   . COPD Mother   . Colon cancer Neg Hx   . Esophageal cancer Neg Hx   .  Inflammatory bowel disease Neg Hx   . Liver disease Neg Hx   . Rectal cancer Neg Hx   . Pancreatic cancer Neg Hx   . Stomach cancer Neg Hx     Social History   Tobacco Use  . Smoking status: Former Smoker    Packs/day: 0.20    Years: 19.00    Pack years: 3.80    Types: Cigarettes  . Smokeless tobacco: Never Used  Vaping Use  . Vaping Use: Never used  Substance Use Topics  . Alcohol use: No    Alcohol/week: 0.0 standard drinks  . Drug use: No    Home Medications Prior to Admission medications   Medication Sig Start Date End Date Taking? Authorizing Provider  amLODipine (NORVASC) 5 MG tablet Take 1 tablet (5 mg total) by mouth daily. 10/07/19   [name], DO  aspirin EC 81 MG tablet Take 1 tablet (81 mg total) by mouth daily. 03/23/16   Timmothy Euler, MD  atorvastatin (LIPITOR) 80 MG tablet Take 1 tablet (80 mg total) by mouth daily. 10/07/19   [name], DO  divalproex (DEPAKOTE ER) 500 MG 24 hr tablet Take 1 tablet (500 mg total) by mouth at bedtime. 11/05/19   Cloria Spring, MD  LORazepam (ATIVAN) 0.5 MG tablet Take 1 tablet (0.5 mg total) by mouth 3 (three) times daily. 12/28/19   Cloria Spring, MD  PARoxetine (PAXIL)  20 MG tablet Take 1 tablet (20 mg total) by mouth at bedtime. 01/18/20   Cloria Spring, MD  QUEtiapine (SEROQUEL) 50 MG tablet Take 1 tablet (50 mg total) by mouth at bedtime. 11/05/19 11/04/20  Cloria Spring, MD  tamsulosin (FLOMAX) 0.4 MG CAPS capsule Take 1 capsule (0.4 mg total) by mouth in the morning and at bedtime. 10/07/19   [name], DO  traZODone (DESYREL) 50 MG tablet Take 1 tablet (50 mg total) by mouth at bedtime. 11/05/19   Cloria Spring, MD  venlafaxine XR (EFFEXOR-XR) 75 MG 24 hr capsule Take 1 capsule (75 mg total) by mouth daily with breakfast. 11/05/19   Cloria Spring, MD    Allergies    Patient has no known allergies.  Review of Systems   Review of Systems  Constitutional: Negative for chills and fever.    HENT: Positive for ear pain and hearing loss. Negative for ear discharge and sore throat.   Respiratory: Negative for cough.   Neurological: Negative for dizziness and headaches.    Physical Exam Updated Vital Signs BP (!) 157/109 (BP Location: Left Arm)   Pulse 67   Temp 98.9 F (37.2 C) (Oral)   Resp 18   Wt (!) 109.8 kg   SpO2 100%   BMI 34.73 kg/m   Physical Exam Vitals and nursing note reviewed.  Constitutional:      Appearance: Normal appearance. He is well-developed.  HENT:     Head: Atraumatic.     Comments: No mastoid tenderness. Tenderness right ear tragus and eac.     Ears:     Comments: Left eac w/ cerumen impaction.  Right eac swollen and tender, unable to visualize tm.     Nose: Nose normal.     Mouth/Throat:     Mouth: Mucous membranes are moist.     Pharynx: Oropharynx is clear.  Eyes:     General: No scleral icterus.    Conjunctiva/sclera: Conjunctivae normal.  Neck:     Trachea: No tracheal deviation.  Cardiovascular:     Rate and Rhythm: Normal rate.     Pulses: Normal pulses.  Pulmonary:     Effort: Pulmonary effort is normal. No accessory muscle usage or respiratory distress.  Genitourinary:    Comments: No cva tenderness. Musculoskeletal:        General: No swelling.     Cervical back: Normal range of motion and neck supple. No rigidity.  Skin:    General: Skin is warm and dry.     Findings: No rash.  Neurological:     Mental Status: He is alert.     Comments: Alert, speech clear.hearing decreased bil.   Psychiatric:        Mood and Affect: Mood normal.     ED Results / Procedures / Treatments   Labs (all labs ordered are listed, but only abnormal results are displayed) Labs Reviewed - No data to display  EKG None  Radiology No results found.  Procedures Procedures (including critical care time)  Medications Ordered in ED Medications - No data to display  ED Course  I have reviewed the triage vital signs and the  nursing notes.  Pertinent labs & imaging results that were available during my care of the patient were reviewed by me and considered in my medical decision making (see chart for details).    MDM Rules/Calculators/A&P  Irrigate EAC by RN staff.   Exam concerning for otitis externa. abx drop and po abx.  Pt indicates already has new/recent rx for abx otic gtts, will give po abx rx. Also discussed/stressed need for close ENT f/u with patient.  Also discussed need close pcp f/u re htn. Pt currently denies any headache, no cp or sob, no swelling, no other new neuro symptoms.   Pt is requesting d/c, indicating he feels ready for d/c.   Return precautions provided.      Final Clinical Impression(s) / ED Diagnoses Final diagnoses:  None    Rx / DC Orders ED Discharge Orders    None       Lajean Saver, MD 01/27/20 1233

## 2020-01-30 MED FILL — LORazepam 0.5 MG TABS: 0.5 | 30 days supply | Qty: 90 | Fill #1

## 2020-02-02 DIAGNOSIS — Z77122 Contact with and (suspected) exposure to noise: Secondary | ICD-10-CM | POA: Diagnosis not present

## 2020-02-02 DIAGNOSIS — H6123 Impacted cerumen, bilateral: Secondary | ICD-10-CM | POA: Diagnosis not present

## 2020-02-02 DIAGNOSIS — H9201 Otalgia, right ear: Secondary | ICD-10-CM | POA: Diagnosis not present

## 2020-02-02 DIAGNOSIS — H9192 Unspecified hearing loss, left ear: Secondary | ICD-10-CM | POA: Diagnosis not present

## 2020-02-05 ENCOUNTER — Encounter: Payer: Medicare HMO | Admitting: Family Medicine

## 2020-02-12 DIAGNOSIS — H9193 Unspecified hearing loss, bilateral: Secondary | ICD-10-CM | POA: Diagnosis not present

## 2020-02-12 DIAGNOSIS — H903 Sensorineural hearing loss, bilateral: Secondary | ICD-10-CM | POA: Diagnosis not present

## 2020-02-17 ENCOUNTER — Telehealth (INDEPENDENT_AMBULATORY_CARE_PROVIDER_SITE_OTHER): Payer: Medicare HMO | Admitting: Psychiatry

## 2020-02-17 ENCOUNTER — Other Ambulatory Visit (HOSPITAL_COMMUNITY): Payer: Self-pay | Admitting: Psychiatry

## 2020-02-17 ENCOUNTER — Other Ambulatory Visit: Payer: Self-pay

## 2020-02-17 ENCOUNTER — Encounter (HOSPITAL_COMMUNITY): Payer: Self-pay | Admitting: Psychiatry

## 2020-02-17 DIAGNOSIS — F3160 Bipolar disorder, current episode mixed, unspecified: Secondary | ICD-10-CM | POA: Diagnosis not present

## 2020-02-17 DIAGNOSIS — F322 Major depressive disorder, single episode, severe without psychotic features: Secondary | ICD-10-CM | POA: Diagnosis not present

## 2020-02-17 MED ORDER — TRAZODONE HCL 50 MG PO TABS: 50.0000 mg | ORAL_TABLET | Freq: Every day | ORAL | 2 refills | Status: DC

## 2020-02-17 MED ORDER — LORAZEPAM 0.5 MG PO TABS: 0.5000 mg | ORAL_TABLET | Freq: Three times a day (TID) | ORAL | 3 refills | Status: DC

## 2020-02-17 MED ORDER — PAROXETINE HCL 20 MG PO TABS: 20.0000 mg | ORAL_TABLET | Freq: Every day | ORAL | 2 refills | Status: DC

## 2020-02-17 MED ORDER — QUETIAPINE FUMARATE 50 MG PO TABS: 50.0000 mg | ORAL_TABLET | Freq: Every day | ORAL | 2 refills | Status: DC

## 2020-02-17 MED ORDER — DIVALPROEX SODIUM ER 500 MG PO TB24: 500.0000 mg | ORAL_TABLET | Freq: Every day | ORAL | 2 refills | Status: DC

## 2020-02-17 MED ORDER — VENLAFAXINE HCL ER 75 MG PO CP24: 75.0000 mg | ORAL_CAPSULE | Freq: Every day | ORAL | 2 refills | Status: DC

## 2020-02-17 MED FILL — VENLAFAXINE HCL ER 75 MG CA: 75 | 90 days supply | Qty: 90 | Fill #0

## 2020-02-17 NOTE — Progress Notes (Signed)
Virtual Visit via Telephone Note  I connected with Lawrence Bennett on 02/17/20 at  3:00 PM EDT by telephone and verified that I am speaking with the correct person using two identifiers.   I discussed the limitations, risks, security and privacy concerns of performing an evaluation and management service by telephone and the availability of in person appointments. I also discussed with the patient that there may be a patient responsible charge related to this service. The patient expressed understanding and agreed to proceed    I discussed the assessment and treatment plan with the patient. The patient was provided an opportunity to ask questions and all were answered. The patient agreed with the plan and demonstrated an understanding of the instructions.   The patient was advised to call back or seek an in-person evaluation if the symptoms worsen or if the condition fails to improve as anticipated.  I provided 58minutes of non-face-to-face time during this encounter. Location: provider home, pt home  Levonne Spiller, MD  [facility] MD/PA/NP OP Progress Note  02/17/2020 3:24 PM Lawrence Bennett  MRN:  [id number]  Chief Complaint:  Chief Complaint    Depression; Anxiety; Follow-up     HPI: This patient is a 64 year old married white male who lives with his wife and 3 children in [place]. 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  The patient presents with his wife today. He is very hard of hearing and it's difficult to get a good history from him but she filled in the gaps. She states that he's had depression for at least one year. He started worrying about his job being discontinued or that [name] might close about a year ago. He finally decided to retire early in November. He also had some medical problems going on such as loss of vision and loss of hearing. In 2012 he had colon resection for diverticulosis.  Since retiring in November his  mental status has declined. He started staying in bed all the time being unable to function not attending to his ADLs. He stopped eating and lost about 10 pounds. He was not interacting with anyone at all. His primary doctor had put him on Zoloft but it was not helpful. He had been on a low dose of Ativan for years as well. Finally in the end of May his wife brought him to [name] ED for evaluation and from there he was transferred to [facility] geropsychiatry unit.  While on the unit he was diagnosed with severe depression. He was started on a number of medicines including Paxil and Effexor, Seroquel trazodone and Depakote. He also had 4 treatments of ECT while in the hospital and2as an outpatient. His last one was in early June.  Since getting out of the hospital he seems to be doing somewhat better. He is getting out of bed every day and doing things around the house and yard. His family went on a trip to a [place] and he refused to go with them. States that he's had a lot of short-term memory loss since having ECT but his wife thinks his mood is much better. He's never had psychotic symptoms such as auditory or visual hallucinations or paranoia but his thoughts were somewhat disorganized. He has never been suicidal or homicidal. He does not drink or use drugs. His wife thinks that he slowly getting better but he still not the person he used to be.  Pt  returns after 3 monthsHe is doing well other than hearing loss. Mood is good, sleeping well, no depression anxiety or suicidal ideation Visit Diagnosis:    ICD-10-CM   1. Bipolar I disorder, most recent episode mixed ([place])  F31.60   2. Severe single current episode of major depressive disorder, without psychotic features ([place])  F32.2     Past Psychiatric History: Hospitalization in 2015  Past Medical History:  Past Medical History:  Diagnosis Date  . Anxiety   . Atherosclerosis of aorta ([place])   . Atrial contractions,  premature   . Depression   . Dyslipidemia   . Elevated glucose 11/11/2015  . Hyperlipidemia   . Mild hypertension   . Urinary hesitancy     Past Surgical History:  Procedure Laterality Date  . COLON RESECTION  2012    Family Psychiatric History: see below  Family History:  Family History  Problem Relation Age of Onset  . Stroke Father   . Bipolar disorder Father   . Hypertension Sister   . Depression Sister   . COPD Mother   . Colon cancer Neg Hx   . Esophageal cancer Neg Hx   . Inflammatory bowel disease Neg Hx   . Liver disease Neg Hx   . Rectal cancer Neg Hx   . Pancreatic cancer Neg Hx   . Stomach cancer Neg Hx     Social History:  Social History   Socioeconomic History  . Marital status: Married    Spouse name: Not on file  . Number of children: 3  . Years of education: Not on file  . Highest education level: Not on file  Occupational History  . Occupation: retired  Tobacco Use  . Smoking status: Former Smoker    Packs/day: 0.20    Years: 19.00    Pack years: 3.80    Types: Cigarettes  . Smokeless tobacco: Never Used  Vaping Use  . Vaping Use: Never used  Substance and Sexual Activity  . Alcohol use: No    Alcohol/week: 0.0 standard drinks  . Drug use: No  . Sexual activity: Not Currently  Other Topics Concern  . Not on file  Social History Narrative  . Not on file   Social Determinants of Health   Financial Resource Strain:   . Difficulty of Paying Living Expenses:   Food Insecurity:   . Worried About Charity fundraiser in the Last Year:   . Arboriculturist in the Last Year:   Transportation Needs:   . Film/video editor (Medical):   Marland Kitchen Lack of Transportation (Non-Medical):   Physical Activity:   . Days of Exercise per Week:   . Minutes of Exercise per Session:   Stress:   . Feeling of Stress :   Social Connections:   . Frequency of Communication with Friends and Family:   . Frequency of Social Gatherings with Friends and Family:    . Attends Religious Services:   . Active Member of Clubs or Organizations:   . Attends Archivist Meetings:   Marland Kitchen Marital Status:     Allergies: No Known Allergies  Metabolic Disorder Labs: Lab Results  Component Value Date   HGBA1C 5.6 08/26/2018   No results found for: PROLACTIN Lab Results  Component Value Date   CHOL 171 03/25/2019   TRIG 155 (H) 03/25/2019   HDL 46 03/25/2019   CHOLHDL 3.7 03/25/2019   LDLCALC 98 03/25/2019   [place] 80 08/26/2018   Lab  Results  Component Value Date   TSH 1.090 10/07/2019   TSH 1.300 08/26/2018    Therapeutic Level Labs: No results found for: LITHIUM Lab Results  Component Value Date   VALPROATE 41.8 (L) 11/29/2016   VALPROATE 44 (L) 11/11/2015   No components found for:  CBMZ  Current Medications: Current Outpatient Medications  Medication Sig Dispense Refill  . amLODipine (NORVASC) 5 MG tablet Take 1 tablet (5 mg total) by mouth daily. 90 tablet 3  . aspirin EC 81 MG tablet Take 1 tablet (81 mg total) by mouth daily. 90 tablet 3  . atorvastatin (LIPITOR) 80 MG tablet Take 1 tablet (80 mg total) by mouth daily. 90 tablet 3  . ciprofloxacin (CIPRO) 500 MG tablet Take 1 tablet (500 mg total) by mouth 2 (two) times daily. 14 tablet 0  . divalproex (DEPAKOTE ER) 500 MG 24 hr tablet Take 1 tablet (500 mg total) by mouth at bedtime. 90 tablet 2  . LORazepam (ATIVAN) 0.5 MG tablet Take 1 tablet (0.5 mg total) by mouth 3 (three) times daily. 90 tablet 3  . PARoxetine (PAXIL) 20 MG tablet Take 1 tablet (20 mg total) by mouth at bedtime. 90 tablet 2  . QUEtiapine (SEROQUEL) 50 MG tablet Take 1 tablet (50 mg total) by mouth at bedtime. 90 tablet 2  . tamsulosin (FLOMAX) 0.4 MG CAPS capsule Take 1 capsule (0.4 mg total) by mouth in the morning and at bedtime. 180 capsule 1  . traZODone (DESYREL) 50 MG tablet Take 1 tablet (50 mg total) by mouth at bedtime. 90 tablet 2  . venlafaxine XR (EFFEXOR-XR) 75 MG 24 hr capsule Take 1  capsule (75 mg total) by mouth daily with breakfast. 90 capsule 2   No current facility-administered medications for this visit.     Musculoskeletal: Strength & Muscle Tone: within normal limits Gait & Station: normal Patient leans: N/A  Psychiatric Specialty Exam: Review of Systems  HENT: Positive for hearing loss.   All other systems reviewed and are negative.   There were no vitals taken for this visit.There is no height or weight on file to calculate BMI.  General Appearance: NA  Eye Contact:  NA  Speech:  Clear and Coherent  Volume:  Normal  Mood:  Euthymic  Affect:  NA  Thought Process:  Goal Directed  Orientation:  Full (Time, Place, and Person)  Thought Content: Rumination   Suicidal Thoughts:  No  Homicidal Thoughts:  No  Memory:  Immediate;   Good Recent;   Good Remote;   Good  Judgement:  Good  Insight:  Fair  Psychomotor Activity:  Normal  Concentration:  Concentration: Good and Attention Span: Good  Recall:  Good  Fund of Knowledge: Good  Language: Good  Akathisia:  No  Handed:  Right  AIMS (if indicated): not done  Assets:  Communication Skills Desire for Improvement Physical Health Resilience Social Support Talents/Skills  ADL's:  Intact  Cognition: WNL  Sleep:  Good   Screenings: GAD-7     Office Visit from 08/26/2018 in [facility] Visit from 05/26/2018 in [facility]  Total GAD-7 Score 0 3    PHQ2-9     Office Visit from 10/07/2019 in [facility] Office Visit from 08/07/2019 in [facility] Visit from 03/25/2019 in [facility] Office Visit from 12/02/2018 in [facility] Office Visit from 08/26/2018 in [facility]  PHQ-2 Total Score  0 0 0 0 0  PHQ-9 Total Score 0 -- 0 -- 0       Assessment and Plan: This patient is a 64 year old male with a history of severe depression  requiring ECT in the past.  He continues to do well on his current regimen.  He will continue lorazepam 0.5 mg 3 times daily for anxiety, Paxil 20 mg at bedtime for depression, Seroquel 50 mg at bedtime for mood stabilization, trazodone 50 mg at bedtime for sleep, Effexor XR 75 mg daily for depression and Depakote ER 500 mg daily at bedtime for mood stabilization.  He will return to see me in 6 months   Levonne Spiller, MD 02/17/2020, 3:24 PM

## 2020-02-20 MED FILL — TAMSULOSIN HCL 0.4 MG CAP: 0.4 | 90 days supply | Qty: 180 | Fill #0

## 2020-02-27 MED FILL — AMLODIPINE BESYLATE 5 MG TA: 5 | 90 days supply | Qty: 90 | Fill #3

## 2020-03-01 MED FILL — LORazepam 0.5 MG TABS: 0.5 | 30 days supply | Qty: 90 | Fill #2

## 2020-03-12 MED FILL — QUETIAPINE FUMARATE 50 MG T: 50 | 90 days supply | Qty: 90 | Fill #1

## 2020-03-12 MED FILL — ATORVASTATIN 80 MG TABLET: 80 | 90 days supply | Qty: 90 | Fill #2

## 2020-03-31 NOTE — Patient Instructions (Signed)
Thank you for coming in today for your Annual Medicare Wellness Visit.  Things that we discussed today are included in this packet.  Create and/or bring a copy of your Living Will/ Advanced Directive into the office so that we may respect your wishes should an emergency occur.  Get the recommended life-saving vaccines we discussed today.  Get your mammogram/ colonoscopy/ DEXA scans as directed by your provider.  Make sure that your medications are organized and safely stored.  Remember to always ask for help if you forget when/ how to take your medications.  Make healthy food choices (Rich in fruits/ veggies/ lean meats and low in salt, sugar and fat)  Do something that you enjoy for at least 30 minutes every day to stay active (walking, gardening, swimming, etc). This will help you lower your risk of falls/ broken bones.  Be social, do puzzles/ crosswords.  These things help the mind stay young and lower your risk of developing dementia.  Make sure that your home is safe by checking your smoke detectors regularly and doing the things outlined below to lower your risk of falls.  Fall Prevention in the Home Falls can cause injuries and can affect people from all age groups. There are many simple things that you can do to make your home safe and to help prevent falls. What can I do on the outside of my home? Regularly repair the edges of walkways and driveways and fix any cracks. Remove high doorway thresholds. Trim any shrubbery on the main path into your home. Use bright outdoor lighting. Clear walkways of debris and clutter, including tools and rocks. Regularly check that handrails are securely fastened and in good repair. Both sides of any steps should have handrails. Install guardrails along the edges of any raised decks or porches. Have leaves, snow, and ice cleared regularly. Use sand or salt on walkways during winter months. In the garage, clean up any spills right away, including  grease or oil spills. What can I do in the bathroom? Use night lights. Install grab bars by the toilet and in the tub and shower. Do not use towel bars as grab bars. Use non-skid mats or decals on the floor of the tub or shower. If you need to sit down while you are in the shower, use a plastic, non-slip stool. Keep the floor dry. Immediately clean up any water that spills on the floor. Remove soap buildup in the tub or shower on a regular basis. Attach bath mats securely with double-sided non-slip rug tape. Remove throw rugs and other tripping hazards from the floor. What can I do in the bedroom? Use night lights. Make sure that a bedside light is easy to reach. Do not use oversized bedding that drapes onto the floor. Have a firm chair that has side arms to use for getting dressed. Remove throw rugs and other tripping hazards from the floor. What can I do in the kitchen? Clean up any spills right away. Avoid walking on wet floors. Place frequently used items in easy-to-reach places. If you need to reach for something above you, use a sturdy step stool that has a grab bar. Keep electrical cables out of the way. Do not use floor polish or wax that makes floors slippery. If you have to use wax, make sure that it is non-skid floor wax. Remove throw rugs and other tripping hazards from the floor. What can I do in the stairways? Do not leave any items on the stairs.   Make sure that there are handrails on both sides of the stairs. Fix handrails that are broken or loose. Make sure that handrails are as long as the stairways. Check any carpeting to make sure that it is firmly attached to the stairs. Fix any carpet that is loose or worn. Avoid having throw rugs at the top or bottom of stairways, or secure the rugs with carpet tape to prevent them from moving. Make sure that you have a light switch at the top of the stairs and the bottom of the stairs. If you do not have them, have them  installed. What are some other fall prevention tips? Wear closed-toe shoes that fit well and support your feet. Wear shoes that have rubber soles or low heels. When you use a stepladder, make sure that it is completely opened and that the sides are firmly locked. Have someone hold the ladder while you are using it. Do not climb a closed stepladder. Add color or contrast paint or tape to grab bars and handrails in your home. Place contrasting color strips on the first and last steps. Use mobility aids as needed, such as canes, walkers, scooters, and crutches. Turn on lights if it is dark. Replace any light bulbs that burn out. Set up furniture so that there are clear paths. Keep the furniture in the same spot. Fix any uneven floor surfaces. Choose a carpet design that does not hide the edge of steps of a stairway. Be aware of any and all pets. Review your medicines with your healthcare provider. Some medicines can cause dizziness or changes in blood pressure, which increase your risk of falling. Talk with your health care provider about other ways that you can decrease your risk of falls. This may include working with a physical therapist or trainer to improve your strength, balance, and endurance. This information is not intended to replace advice given to you by your health care provider. Make sure you discuss any questions you have with your health care provider. Document Released: 06/08/2002 Document Revised: 11/15/2015 Document Reviewed: 07/23/2014 Elsevier Interactive Patient Education  2017 Elsevier Inc.  

## 2020-03-31 NOTE — Progress Notes (Signed)
Subjective:   Lawrence Bennett is a 64 y.o. male who presents for a Welcome to Medicare exam.   He is compliant with all medications.  He resides at home with his wife and 3 children, who are 74, 19 and 23 respectively.  He is retired as of age 44.  He admits to eating sweets frequently would like to have his sugar checked today.  No chest pain, shortness of breath, change in exercise tolerance or edema.  No falls.  He denies any alcohol or tobacco use.   Review of Systems: See HPI   Cardiac Risk Factors include: advanced age (>73men, >44 women);dyslipidemia;hypertension;male gender;obesity (BMI >30kg/m2)     Objective:    Today's Vitals   04/01/20 1354 04/01/20 1356  BP: (!) 152/91 128/81  Pulse: 88 86  Temp: (!) 97.2 F (36.2 C)   SpO2: 94%   Weight: 242 lb 12.8 oz (110.1 kg)   Height: 5\' 10"  (1.778 m)    Body mass index is 34.84 kg/m.  Medications Outpatient Encounter Medications as of 04/01/2020  Medication Sig  . amLODipine (NORVASC) 5 MG tablet Take 1 tablet (5 mg total) by mouth daily.  Marland Kitchen aspirin EC 81 MG tablet Take 1 tablet (81 mg total) by mouth daily.  Marland Kitchen atorvastatin (LIPITOR) 80 MG tablet Take 1 tablet (80 mg total) by mouth daily.  . ciprofloxacin (CIPRO) 500 MG tablet Take 1 tablet (500 mg total) by mouth 2 (two) times daily.  . divalproex (DEPAKOTE ER) 500 MG 24 hr tablet Take 1 tablet (500 mg total) by mouth at bedtime.  Marland Kitchen LORazepam (ATIVAN) 0.5 MG tablet Take 1 tablet (0.5 mg total) by mouth 3 (three) times daily.  Marland Kitchen PARoxetine (PAXIL) 20 MG tablet Take 1 tablet (20 mg total) by mouth at bedtime.  Marland Kitchen QUEtiapine (SEROQUEL) 50 MG tablet Take 1 tablet (50 mg total) by mouth at bedtime.  . tamsulosin (FLOMAX) 0.4 MG CAPS capsule Take 1 capsule (0.4 mg total) by mouth in the morning and at bedtime.  . traZODone (DESYREL) 50 MG tablet Take 1 tablet (50 mg total) by mouth at bedtime.  Marland Kitchen venlafaxine XR (EFFEXOR-XR) 75 MG 24 hr capsule Take 1 capsule (75 mg total)  by mouth daily with breakfast.   No facility-administered encounter medications on file as of 04/01/2020.     History: Past Medical History:  Diagnosis Date  . Anxiety   . Atherosclerosis of aorta ([place])   . Atrial contractions, premature   . Depression   . Dyslipidemia   . Elevated glucose 11/11/2015  . Hyperlipidemia   . Mild hypertension   . Urinary hesitancy    Past Surgical History:  Procedure Laterality Date  . COLON RESECTION  2012    Family History  Problem Relation Age of Onset  . Stroke Father   . Bipolar disorder Father   . Hypertension Sister   . Depression Sister   . COPD Mother   . Colon cancer Neg Hx   . Esophageal cancer Neg Hx   . Inflammatory bowel disease Neg Hx   . Liver disease Neg Hx   . Rectal cancer Neg Hx   . Pancreatic cancer Neg Hx   . Stomach cancer Neg Hx    Social History   Occupational History  . Occupation: retired  Tobacco Use  . Smoking status: Former Smoker    Packs/day: 0.20    Years: 19.00    Pack years: 3.80    Types: Cigarettes  . Smokeless  tobacco: Never Used  Vaping Use  . Vaping Use: Never used  Substance and Sexual Activity  . Alcohol use: No    Alcohol/week: 0.0 standard drinks  . Drug use: No  . Sexual activity: Not Currently   Tobacco Counseling Counseling given: Not Answered   Immunizations and Health Maintenance Immunization History  Administered Date(s) Administered  . Influenza Inj Mdck Quad Pf 03/24/2018  . Influenza,inj,Quad PF,6+ Mos 03/23/2016, 03/26/2017, 03/26/2019  . Pneumococcal Conjugate-13 05/26/2018  . Pneumococcal Polysaccharide-23 10/07/2019  . Tdap 04/23/2018   Health Maintenance Due  Topic Date Due  . COVID-19 Vaccine (1) Never done  . INFLUENZA VACCINE  01/31/2020    Activities of Daily Living In your present state of health, do you have any difficulty performing the following activities: 04/01/2020  Hearing? Y  Comment deaf in one ear, 50 deaf right ear  Vision? N  Difficulty  concentrating or making decisions? N  Walking or climbing stairs? N  Dressing or bathing? N  Doing errands, shopping? N  Preparing Food and eating ? N  Using the Toilet? N  In the past six months, have you accidently leaked urine? N  Do you have problems with loss of bowel control? N  Managing your Medications? N  Managing your Finances? N  Housekeeping or managing your Housekeeping? N  Some recent data might be hidden    Physical Exam   Gen: Well-appearing, well-nourished male Cardio: Regular rate and rhythm.  S1-S2 heard.  No murmurs Pulmonary: Clear to auscultation bilaterally.  Normal work of breathing on room air.  No wheezes, rhonchi or rales Psych: Mood stable, speech normal, affect slightly flat the patient is pleasant and engaging  Advanced Directives: Does Patient Have a Medical Advance Directive?: No Would patient like information on creating a medical advance directive?: No - Patient declined    Assessment:    This is a routine wellness  examination for this patient .  Vision/Hearing screen Wears glasses.  Hard of hearing (chronic)  Dietary issues and exercise activities discussed:  Current Exercise Habits: The patient does not participate in regular exercise at present, Exercise limited by: None identified  Goals   None     Depression Screen PHQ 2/9 Scores 04/01/2020 10/07/2019 08/07/2019 03/25/2019  PHQ - 2 Score 0 0 0 0  PHQ- 9 Score - 0 - 0     Fall Risk Fall Risk  04/01/2020  Falls in the past year? 1  Number falls in past yr: 0  Injury with Fall? 0  Comment -  Risk for fall due to : History of fall(s)  Follow up Falls evaluation completed    Cognitive Function      6CIT Screen 04/01/2020  What Year? 0 points  What month? 0 points  What time? 0 points  Count back from 20 0 points  Months in reverse 0 points  Repeat phrase 0 points  Total Score 0   Patient Care Team: [name], DO as PCP - General (Family Medicine)     Plan:       1. Welcome to Medicare preventive visit Living will information provided to the patient today.  Reinforced adequate exercise and good nutrition.  Limit carbohydrates.  2. Morbid obesity ([place]) - Bayer DCA Hb A1c Waived  3. Essential hypertension Controlled upon recheck.   I have personally reviewed and noted the following in the patient's chart:   . Medical and social history . Use of alcohol, tobacco or illicit drugs  .  Current medications and supplements . Functional ability and status . Nutritional status . Physical activity . Advanced directives . List of other physicians . Hospitalizations, surgeries, and ER visits in previous 12 months . Vitals . Screenings to include cognitive, depression, and falls . Referrals and appointments  In addition, I have reviewed and discussed with patient certain preventive protocols, quality metrics, and best practice recommendations. A written personalized care plan for preventive services as well as general preventive health recommendations were provided to patient.    Ronnie Doss, DO 04/01/2020

## 2020-04-01 ENCOUNTER — Other Ambulatory Visit: Payer: Self-pay

## 2020-04-01 ENCOUNTER — Encounter: Payer: Self-pay | Admitting: Family Medicine

## 2020-04-01 ENCOUNTER — Ambulatory Visit (INDEPENDENT_AMBULATORY_CARE_PROVIDER_SITE_OTHER): Payer: Medicare HMO | Admitting: Family Medicine

## 2020-04-01 VITALS — BP 128/81 | HR 86 | Temp 97.2°F | Ht 70.0 in | Wt 242.8 lb

## 2020-04-01 DIAGNOSIS — Z0001 Encounter for general adult medical examination with abnormal findings: Secondary | ICD-10-CM

## 2020-04-01 DIAGNOSIS — Z0000 Encounter for general adult medical examination without abnormal findings: Secondary | ICD-10-CM

## 2020-04-01 DIAGNOSIS — I10 Essential (primary) hypertension: Secondary | ICD-10-CM

## 2020-04-01 DIAGNOSIS — R7303 Prediabetes: Secondary | ICD-10-CM | POA: Diagnosis not present

## 2020-04-01 LAB — BAYER DCA HB A1C WAIVED: HB A1C (BAYER DCA - WAIVED): 5.9 % (ref <7.0)

## 2020-04-01 MED FILL — LORazepam 0.5 MG TABS: 0.5 | 30 days supply | Qty: 90 | Fill #3

## 2020-04-02 MED FILL — DIVALPROEX SOD ER 500 MG TA: 500 | 90 days supply | Qty: 90 | Fill #0

## 2020-04-06 ENCOUNTER — Ambulatory Visit: Payer: Medicare HMO | Admitting: Family Medicine

## 2020-04-18 ENCOUNTER — Other Ambulatory Visit (HOSPITAL_COMMUNITY): Payer: Self-pay | Admitting: Otolaryngology

## 2020-04-18 DIAGNOSIS — H938X1 Other specified disorders of right ear: Secondary | ICD-10-CM | POA: Diagnosis not present

## 2020-04-18 DIAGNOSIS — L539 Erythematous condition, unspecified: Secondary | ICD-10-CM | POA: Diagnosis not present

## 2020-04-18 DIAGNOSIS — H8301 Labyrinthitis, right ear: Secondary | ICD-10-CM | POA: Diagnosis not present

## 2020-04-18 DIAGNOSIS — H903 Sensorineural hearing loss, bilateral: Secondary | ICD-10-CM | POA: Diagnosis not present

## 2020-04-18 DIAGNOSIS — Z45321 Encounter for adjustment and management of cochlear device: Secondary | ICD-10-CM | POA: Diagnosis not present

## 2020-04-18 MED FILL — AMOX-CLAV 875-125 MG TABLET: 875-125 | 10 days supply | Qty: 20 | Fill #0

## 2020-04-19 MED FILL — PARoxetine HCL 20 MG TABS: 20 | 90 days supply | Qty: 90 | Fill #1

## 2020-04-19 MED FILL — traZODone HCL 50 MG TABS: 50 | 90 days supply | Qty: 90 | Fill #1

## 2020-05-06 DIAGNOSIS — H918X9 Other specified hearing loss, unspecified ear: Secondary | ICD-10-CM | POA: Diagnosis not present

## 2020-05-06 DIAGNOSIS — H903 Sensorineural hearing loss, bilateral: Secondary | ICD-10-CM | POA: Diagnosis not present

## 2020-05-07 MED FILL — LORazepam 0.5 MG TABS: 0.5 | 30 days supply | Qty: 90 | Fill #0

## 2020-05-12 ENCOUNTER — Other Ambulatory Visit (HOSPITAL_COMMUNITY): Payer: Self-pay | Admitting: Psychiatry

## 2020-05-12 MED ORDER — LORAZEPAM 0.5 MG PO TABS: 0.5000 mg | ORAL_TABLET | Freq: Three times a day (TID) | ORAL | 3 refills | Status: DC

## 2020-05-16 IMAGING — DX DG SHOULDER 2+V*R*
3 series · 3 of 3 positions shown · non-contrast
Comparison: None.

CLINICAL DATA: Right shoulder pain after fall.

EXAM:
RIGHT SHOULDER - 2+ VIEW

[shoulder ap]
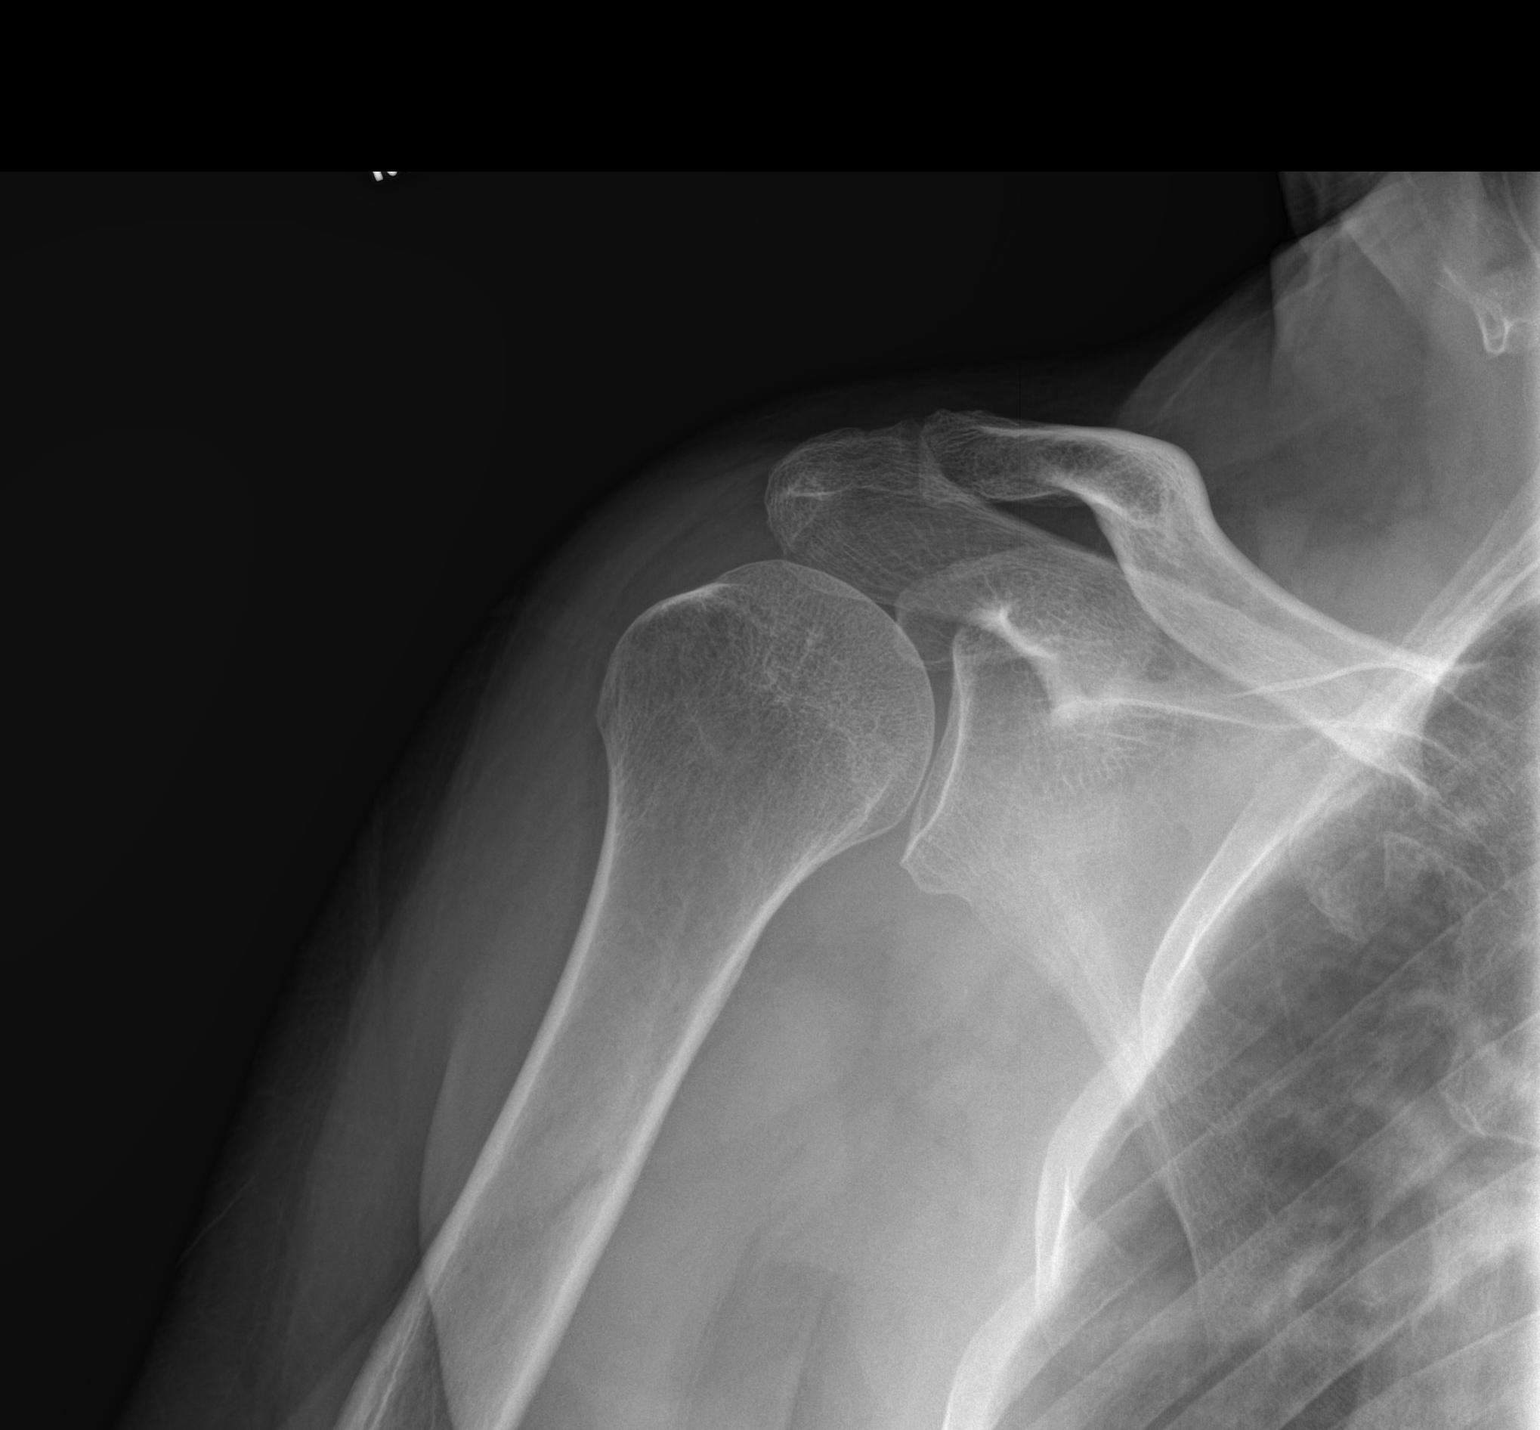

[shoulder obl]
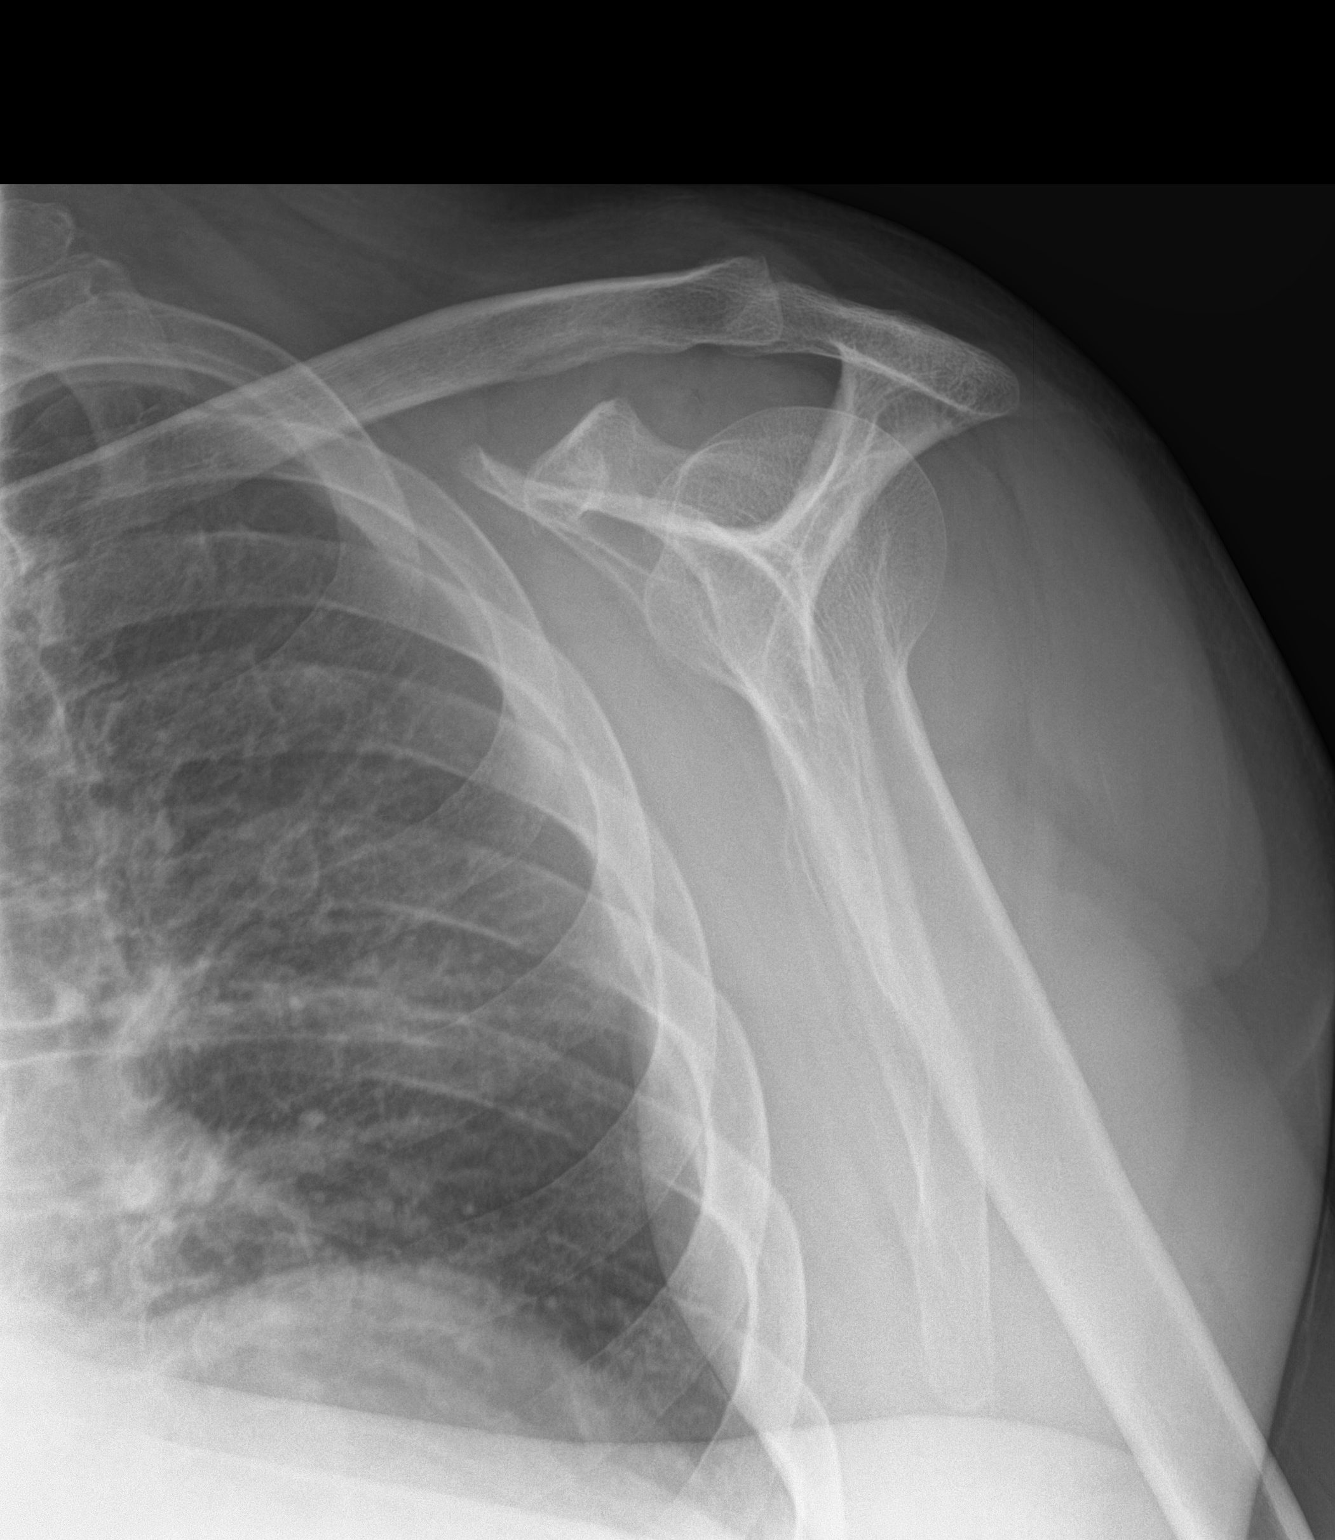

[shoulder axial]
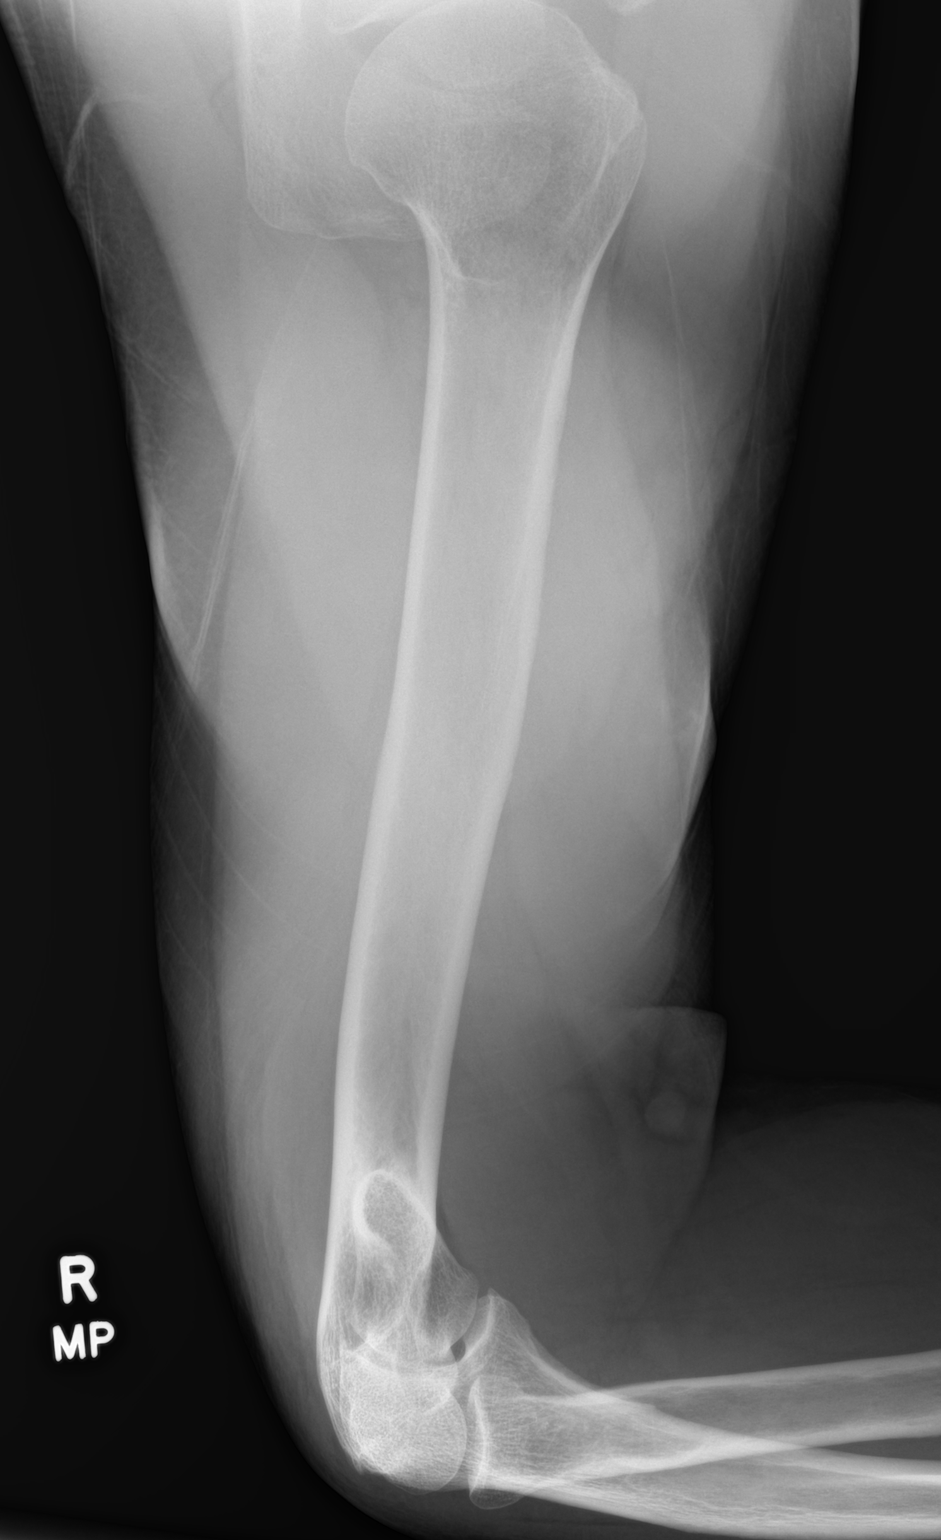

[3 of 3 positions shown; findings below may reference images not displayed]

FINDINGS: There is no evidence of fracture or dislocation. There is no
evidence of arthropathy or other focal bone abnormality. Soft
tissues are unremarkable.
IMPRESSION: Negative.

## 2020-06-03 MED FILL — AMLODIPINE BESYLATE 5 MG TA: 5 | 90 days supply | Qty: 90 | Fill #0

## 2020-06-03 MED FILL — VENLAFAXINE HCL ER 75 MG CA: 75 | 90 days supply | Qty: 90 | Fill #1

## 2020-06-03 MED FILL — LORazepam 0.5 MG TABS: 0.5 | 30 days supply | Qty: 90 | Fill #1

## 2020-06-10 ENCOUNTER — Ambulatory Visit (INDEPENDENT_AMBULATORY_CARE_PROVIDER_SITE_OTHER): Payer: Medicare HMO

## 2020-06-10 ENCOUNTER — Other Ambulatory Visit: Payer: Self-pay

## 2020-06-10 DIAGNOSIS — Z23 Encounter for immunization: Secondary | ICD-10-CM

## 2020-06-11 MED FILL — QUETIAPINE FUMARATE 50 MG T: 50 | 90 days supply | Qty: 90 | Fill #0

## 2020-06-13 ENCOUNTER — Other Ambulatory Visit (HOSPITAL_COMMUNITY): Payer: Self-pay | Admitting: Psychiatry

## 2020-06-13 MED ORDER — QUETIAPINE FUMARATE 50 MG PO TABS: 50.0000 mg | ORAL_TABLET | Freq: Every day | ORAL | 2 refills | Status: DC

## 2020-06-18 MED FILL — ATORVASTATIN 80 MG TABLET: 80 | 90 days supply | Qty: 90 | Fill #0

## 2020-06-20 ENCOUNTER — Other Ambulatory Visit: Payer: Self-pay | Admitting: *Deleted

## 2020-06-20 DIAGNOSIS — E782 Mixed hyperlipidemia: Secondary | ICD-10-CM

## 2020-06-21 ENCOUNTER — Other Ambulatory Visit: Payer: Self-pay | Admitting: Family Medicine

## 2020-06-21 MED ORDER — ATORVASTATIN CALCIUM 80 MG PO TABS: 80.0000 mg | ORAL_TABLET | Freq: Every day | ORAL | 3 refills | Status: DC

## 2020-06-27 MED FILL — ATORVASTATIN 80 MG TABLET: 80 | 90 days supply | Qty: 90 | Fill #0

## 2020-06-27 MED FILL — DIVALPROEX SOD ER 500 MG TA: 500 | 90 days supply | Qty: 90 | Fill #0

## 2020-06-28 ENCOUNTER — Other Ambulatory Visit (HOSPITAL_COMMUNITY): Payer: Self-pay | Admitting: Psychiatry

## 2020-06-28 MED ORDER — DIVALPROEX SODIUM ER 500 MG PO TB24: 500.0000 mg | ORAL_TABLET | Freq: Every day | ORAL | 2 refills | Status: DC

## 2020-07-08 MED FILL — LORazepam 0.5 MG TABS: 0.5 | 30 days supply | Qty: 90 | Fill #0

## 2020-07-18 MED FILL — PARoxetine HCL 20 MG TABS: 20 | 90 days supply | Qty: 90 | Fill #2

## 2020-07-23 MED FILL — traZODone HCL 50 MG TABS: 50 | 90 days supply | Qty: 90 | Fill #2

## 2020-08-11 MED FILL — LORAZEPAM 0.5 MG TABS: 0.5 | 30 days supply | Qty: 90 | Fill #1

## 2020-08-15 ENCOUNTER — Ambulatory Visit: Payer: Medicare HMO | Admitting: Family Medicine

## 2020-08-17 ENCOUNTER — Encounter: Payer: Self-pay | Admitting: Family Medicine

## 2020-08-17 ENCOUNTER — Other Ambulatory Visit: Payer: Self-pay

## 2020-08-17 ENCOUNTER — Ambulatory Visit (INDEPENDENT_AMBULATORY_CARE_PROVIDER_SITE_OTHER): Payer: Medicare HMO | Admitting: Family Medicine

## 2020-08-17 VITALS — BP 145/89 | HR 93 | Temp 97.7°F | Ht 70.0 in | Wt 255.0 lb

## 2020-08-17 DIAGNOSIS — I10 Essential (primary) hypertension: Secondary | ICD-10-CM | POA: Diagnosis not present

## 2020-08-17 DIAGNOSIS — R030 Elevated blood-pressure reading, without diagnosis of hypertension: Secondary | ICD-10-CM | POA: Diagnosis not present

## 2020-08-17 NOTE — Patient Instructions (Signed)
Increase exercise!  This will help with stamina and blood pressure  Get a Large or "Thigh" cuff from the pharmacy to attach to your blood pressure monitor.  Too small of a cuff will give inaccurate readings.   [email or web address]">  DASH Eating Plan DASH stands for Dietary Approaches to Stop Hypertension. The DASH eating plan is a healthy eating plan that has been shown to:  Reduce high blood pressure (hypertension).  Reduce your risk for type 2 diabetes, heart disease, and stroke.  Help with weight loss. What are tips for following this plan? Reading food labels  Check food labels for the amount of salt (sodium) per serving. Choose foods with less than 5 percent of the Daily Value of sodium. Generally, foods with less than 300 milligrams (mg) of sodium per serving fit into this eating plan.  To find whole grains, look for the word "whole" as the first word in the ingredient list. Shopping  Buy products labeled as "low-sodium" or "no salt added."  Buy fresh foods. Avoid canned foods and pre-made or frozen meals. Cooking  Avoid adding salt when cooking. Use salt-free seasonings or herbs instead of table salt or sea salt. Check with your health care provider or pharmacist before using salt substitutes.  Do not fry foods. Cook foods using healthy methods such as baking, boiling, grilling, roasting, and broiling instead.  Cook with heart-healthy oils, such as olive, canola, avocado, soybean, or sunflower oil. Meal planning  Eat a balanced diet that includes: ? 4 or more servings of fruits and 4 or more servings of vegetables each day. Try to fill one-half of your plate with fruits and vegetables. ? 6-8 servings of whole grains each day. ? Less than 6 oz (170 g) of lean meat, poultry, or fish each day. A 3-oz (85-g) serving of meat is about the same size as a deck of cards. One egg equals 1 oz (28 g). ? 2-3 servings of low-fat dairy each  day. One serving is 1 cup (237 mL). ? 1 serving of nuts, seeds, or beans 5 times each week. ? 2-3 servings of heart-healthy fats. Healthy fats called omega-3 fatty acids are found in foods such as walnuts, flaxseeds, fortified milks, and eggs. These fats are also found in cold-water fish, such as sardines, salmon, and mackerel.  Limit how much you eat of: ? Canned or prepackaged foods. ? Food that is high in trans fat, such as some fried foods. ? Food that is high in saturated fat, such as fatty meat. ? Desserts and other sweets, sugary drinks, and other foods with added sugar. ? Full-fat dairy products.  Do not salt foods before eating.  Do not eat more than 4 egg yolks a week.  Try to eat at least 2 vegetarian meals a week.  Eat more home-cooked food and less restaurant, buffet, and fast food.   Lifestyle  When eating at a restaurant, ask that your food be prepared with less salt or no salt, if possible.  If you drink alcohol: ? Limit how much you use to:  0-1 drink a day for women who are not pregnant.  0-2 drinks a day for men. ? Be aware of how much alcohol is in your drink. In the [place] one drink equals one 12 oz bottle of beer (355 mL), one 5 oz glass of wine (148 mL), or one 1 oz glass of hard liquor (44 mL). General information  Avoid eating more than 2,300 mg of salt a day. If  you have hypertension, you may need to reduce your sodium intake to 1,500 mg a day.  Work with your health care provider to maintain a healthy body weight or to lose weight. Ask what an ideal weight is for you.  Get at least 30 minutes of exercise that causes your heart to beat faster (aerobic exercise) most days of the week. Activities may include walking, swimming, or biking.  Work with your health care provider or dietitian to adjust your eating plan to your individual calorie needs. What foods should I eat? Fruits All fresh, dried, or frozen fruit. Canned fruit in natural juice (without  added sugar). Vegetables Fresh or frozen vegetables (raw, steamed, roasted, or grilled). Low-sodium or reduced-sodium tomato and vegetable juice. Low-sodium or reduced-sodium tomato sauce and tomato paste. Low-sodium or reduced-sodium canned vegetables. Grains Whole-grain or whole-wheat bread. Whole-grain or whole-wheat pasta. Brown rice. Lawrence Bennett. Bulgur. Whole-grain and low-sodium cereals. Pita bread. Low-fat, low-sodium crackers. Whole-wheat flour tortillas. Meats and other proteins Skinless chicken or [place]. Ground chicken or [place]. Pork with fat trimmed off. Fish and seafood. Egg whites. Dried beans, peas, or lentils. Unsalted nuts, nut butters, and seeds. Unsalted canned beans. Lean cuts of beef with fat trimmed off. Low-sodium, lean precooked or cured meat, such as sausages or meat loaves. Dairy Low-fat (1%) or fat-free (skim) milk. Reduced-fat, low-fat, or fat-free cheeses. Nonfat, low-sodium ricotta or cottage cheese. Low-fat or nonfat yogurt. Low-fat, low-sodium cheese. Fats and oils Soft margarine without trans fats. Vegetable oil. Reduced-fat, low-fat, or light mayonnaise and salad dressings (reduced-sodium). Canola, safflower, olive, avocado, soybean, and sunflower oils. Avocado. Seasonings and condiments Herbs. Spices. Seasoning mixes without salt. Other foods Unsalted popcorn and pretzels. Fat-free sweets. The items listed above may not be a complete list of foods and beverages you can eat. Contact a dietitian for more information. What foods should I avoid? Fruits Canned fruit in a light or heavy syrup. Fried fruit. Fruit in cream or butter sauce. Vegetables Creamed or fried vegetables. Vegetables in a cheese sauce. Regular canned vegetables (not low-sodium or reduced-sodium). Regular canned tomato sauce and paste (not low-sodium or reduced-sodium). Regular tomato and vegetable juice (not low-sodium or reduced-sodium). Lawrence Bennett. Olives. Grains Baked goods made with fat,  such as croissants, muffins, or some breads. Dry pasta or rice meal packs. Meats and other proteins Fatty cuts of meat. Ribs. Fried meat. Lawrence Bennett. Bologna, salami, and other precooked or cured meats, such as sausages or meat loaves. Fat from the back of a pig (fatback). Bratwurst. Salted nuts and seeds. Canned beans with added salt. Canned or smoked fish. Whole eggs or egg yolks. Chicken or [place] with skin. Dairy Whole or 2% milk, cream, and half-and-half. Whole or full-fat cream cheese. Whole-fat or sweetened yogurt. Full-fat cheese. Nondairy creamers. Whipped toppings. Processed cheese and cheese spreads. Fats and oils Butter. Stick margarine. Lard. Shortening. Ghee. Bacon fat. Tropical oils, such as coconut, palm kernel, or palm oil. Seasonings and condiments Onion salt, garlic salt, seasoned salt, table salt, and sea salt. Worcestershire sauce. Tartar sauce. Barbecue sauce. Teriyaki sauce. Soy sauce, including reduced-sodium. Steak sauce. Canned and packaged gravies. Fish sauce. Oyster sauce. Cocktail sauce. Store-bought horseradish. Ketchup. Mustard. Meat flavorings and tenderizers. Bouillon cubes. Hot sauces. Pre-made or packaged marinades. Pre-made or packaged taco seasonings. Relishes. Regular salad dressings. Other foods Salted popcorn and pretzels. The items listed above may not be a complete list of foods and beverages you should avoid. Contact a dietitian for more information. Where to find more information  National Heart, Lung, and Blood Institute: https://wilson-eaton.com/  American Heart Association: www.heart.org  Academy of Nutrition and Dietetics: www.eatright.[place]: www.kidney.org Summary  The DASH eating plan is a healthy eating plan that has been shown to reduce high blood pressure (hypertension). It may also reduce your risk for type 2 diabetes, heart disease, and stroke.  When on the DASH eating plan, aim to eat more fresh fruits and vegetables, whole  grains, lean proteins, low-fat dairy, and heart-healthy fats.  With the DASH eating plan, you should limit salt (sodium) intake to 2,300 mg a day. If you have hypertension, you may need to reduce your sodium intake to 1,500 mg a day.  Work with your health care provider or dietitian to adjust your eating plan to your individual calorie needs. This information is not intended to replace advice given to you by your health care provider. Make sure you discuss any questions you have with your health care provider. Document Revised: 05/22/2019 Document Reviewed: 05/22/2019 Elsevier Patient Education  2021 [employer].

## 2020-08-17 NOTE — Progress Notes (Signed)
Subjective: CC: High blood pressure reading PCP: [name], DO GEX:BMWUXL H Bennett is a 65 y.o. male presenting to clinic today for:  1. High blood pressure reading Patient reports that he was seeing high blood pressure readings at home as high as 180/100. He denies any chest pain, shortness of breath, edema, falls, blurred vision or headache. He does have some change in exercise tolerance but admits that he is not as physically active as he used to be. He typically walks his dog daily. No orthopnea. He is using a [employer] brand blood pressure monitor with a normal size cuff. He does place the arm at heart level when checking. He admits that he does not necessarily salt restrict but he does not add salt to foods.   ROS: Per HPI  No Known Allergies Past Medical History:  Diagnosis Date  . Anxiety   . Atherosclerosis of aorta ([place])   . Atrial contractions, premature   . Depression   . Dyslipidemia   . Elevated glucose 11/11/2015  . Hyperlipidemia   . Mild hypertension   . Urinary hesitancy     Current Outpatient Medications:  .  amLODipine (NORVASC) 5 MG tablet, Take 1 tablet (5 mg total) by mouth daily., Disp: 90 tablet, Rfl: 3 .  aspirin EC 81 MG tablet, Take 1 tablet (81 mg total) by mouth daily., Disp: 90 tablet, Rfl: 3 .  atorvastatin (LIPITOR) 80 MG tablet, Take 1 tablet (80 mg total) by mouth daily., Disp: 90 tablet, Rfl: 3 .  divalproex (DEPAKOTE ER) 500 MG 24 hr tablet, Take 1 tablet (500 mg total) by mouth at bedtime., Disp: 90 tablet, Rfl: 2 .  LORazepam (ATIVAN) 0.5 MG tablet, Take 1 tablet (0.5 mg total) by mouth 3 (three) times daily., Disp: 90 tablet, Rfl: 3 .  PARoxetine (PAXIL) 20 MG tablet, Take 1 tablet (20 mg total) by mouth at bedtime., Disp: 90 tablet, Rfl: 2 .  QUEtiapine (SEROQUEL) 50 MG tablet, Take 1 tablet (50 mg total) by mouth at bedtime., Disp: 90 tablet, Rfl: 2 .  tamsulosin (FLOMAX) 0.4 MG CAPS capsule, Take 1 capsule (0.4 mg total) by mouth  in the morning and at bedtime., Disp: 180 capsule, Rfl: 1 .  traZODone (DESYREL) 50 MG tablet, Take 1 tablet (50 mg total) by mouth at bedtime., Disp: 90 tablet, Rfl: 2 .  venlafaxine XR (EFFEXOR-XR) 75 MG 24 hr capsule, Take 1 capsule (75 mg total) by mouth daily with breakfast., Disp: 90 capsule, Rfl: 2 Social History   Socioeconomic History  . Marital status: Married    Spouse name: Not on file  . Number of children: 3  . Years of education: Not on file  . Highest education level: Not on file  Occupational History  . Occupation: retired  Tobacco Use  . Smoking status: Former Smoker    Packs/day: 0.20    Years: 19.00    Pack years: 3.80    Types: Cigarettes  . Smokeless tobacco: Never Used  Vaping Use  . Vaping Use: Never used  Substance and Sexual Activity  . Alcohol use: No    Alcohol/week: 0.0 standard drinks  . Drug use: No  . Sexual activity: Not Currently  Other Topics Concern  . Not on file  Social History Narrative  . Not on file   Social Determinants of Health   Financial Resource Strain: Not on file  Food Insecurity: Not on file  Transportation Needs: Not on file  Physical Activity: Not on  file  Stress: Not on file  Social Connections: Not on file  Intimate Partner Violence: Not on file   Family History  Problem Relation Age of Onset  . Stroke Father   . Bipolar disorder Father   . Hypertension Sister   . Depression Sister   . COPD Mother   . Colon cancer Neg Hx   . Esophageal cancer Neg Hx   . Inflammatory bowel disease Neg Hx   . Liver disease Neg Hx   . Rectal cancer Neg Hx   . Pancreatic cancer Neg Hx   . Stomach cancer Neg Hx     Objective: Office vital signs reviewed. BP (!) 145/89   Pulse 93   Temp 97.7 F (36.5 C) (Temporal)   Ht 5\' 10"  (1.778 m)   Wt 255 lb (115.7 kg)   BMI 36.59 kg/m   Physical Examination:  General: Awake, alert, well nourished, No acute distress HEENT: Normal, sclera white, MMM Cardio: regular rate and  rhythm, S1S2 heard, no murmurs appreciated Pulm: clear to auscultation bilaterally, no wheezes, rhonchi or rales; normal work of breathing on room air Extremities: warm, well perfused, No edema, cyanosis or clubbing; +2 pulses bilaterally  Assessment/ Plan: 65 y.o. male   Essential hypertension  Elevated blood pressure reading  His blood pressure is appropriate for his age today upon recheck. I do think that he is a likely seeing elevated blood pressure readings at home because he is using too small of a cough. I reiterated appropriate blood pressure method and also advised him to get a large or thigh sized cuff. I have also reviewed DASH diet and increase physical exercise to improve blood pressure. I will see him back in the next 4 to 6 weeks for reevaluation, weight recheck and we will advance blood pressure regimen at that time if needed. He voiced good understanding of the plan. Plan for fasting labs at next visit.  No orders of the defined types were placed in this encounter.  No orders of the defined types were placed in this encounter.    [name], DO [place] [phone]

## 2020-08-18 ENCOUNTER — Other Ambulatory Visit (HOSPITAL_COMMUNITY): Payer: Self-pay | Admitting: Psychiatry

## 2020-08-18 ENCOUNTER — Telehealth (INDEPENDENT_AMBULATORY_CARE_PROVIDER_SITE_OTHER): Payer: Medicare HMO | Admitting: Psychiatry

## 2020-08-18 ENCOUNTER — Encounter (HOSPITAL_COMMUNITY): Payer: Self-pay | Admitting: Psychiatry

## 2020-08-18 DIAGNOSIS — F322 Major depressive disorder, single episode, severe without psychotic features: Secondary | ICD-10-CM | POA: Diagnosis not present

## 2020-08-18 DIAGNOSIS — F3160 Bipolar disorder, current episode mixed, unspecified: Secondary | ICD-10-CM

## 2020-08-18 MED ORDER — DIVALPROEX SODIUM ER 500 MG PO TB24: 500.0000 mg | ORAL_TABLET | Freq: Every day | ORAL | 2 refills | Status: DC

## 2020-08-18 MED ORDER — QUETIAPINE FUMARATE 50 MG PO TABS: 50.0000 mg | ORAL_TABLET | Freq: Every day | ORAL | 2 refills | Status: DC

## 2020-08-18 MED ORDER — LORAZEPAM 0.5 MG PO TABS: 0.5000 mg | ORAL_TABLET | Freq: Three times a day (TID) | ORAL | 3 refills | Status: DC

## 2020-08-18 MED ORDER — VENLAFAXINE HCL ER 75 MG PO CP24: 75.0000 mg | ORAL_CAPSULE | Freq: Every day | ORAL | 2 refills | Status: DC

## 2020-08-18 MED ORDER — TRAZODONE HCL 50 MG PO TABS: 50.0000 mg | ORAL_TABLET | Freq: Every day | ORAL | 2 refills | Status: DC

## 2020-08-18 MED ORDER — PAROXETINE HCL 20 MG PO TABS: 20.0000 mg | ORAL_TABLET | Freq: Every day | ORAL | 2 refills | Status: DC

## 2020-08-18 NOTE — Progress Notes (Signed)
Virtual Visit via Telephone Note  I connected with [facility] Bennett on 08/18/20 at  1:00 PM EST by telephone and verified that I am speaking with the correct person using two identifiers.  Location: Patient: home Provider: office   I discussed the limitations, risks, security and privacy concerns of performing an evaluation and management service by telephone and the availability of in person appointments. I also discussed with the patient that there may be a patient responsible charge related to this service. The patient expressed understanding and agreed to proceed.    I discussed the assessment and treatment plan with the patient. The patient was provided an opportunity to ask questions and all were answered. The patient agreed with the plan and demonstrated an understanding of the instructions.   The patient was advised to call back or seek an in-person evaluation if the symptoms worsen or if the condition fails to improve as anticipated.  I provided 15 minutes of non-face-to-face time during this encounter.   Lawrence Spiller, MD  [facility] MD/PA/NP OP Progress Note  08/18/2020 1:19 PM [facility]  MRN:  [id number]  Chief Complaint:  Chief Complaint    Anxiety; Depression; Follow-up     HPI: This patient is a 65 year old married white male who lives with his wife and 3 children in [place]. 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  The patient presents with his wife today. He is very hard of hearing and it's difficult to get a good history from him but she filled in the gaps. She states that he's had depression for at least one year. He started worrying about his job being discontinued or that [facility] might close about a year ago. He finally decided to retire early in November. He also had some medical problems going on such as loss of vision and loss of hearing. In 2012 he had colon resection for diverticulosis.  Since retiring in  November his mental status has declined. He started staying in bed all the time being unable to function not attending to his ADLs. He stopped eating and lost about 10 pounds. He was not interacting with anyone at all. His primary doctor had put him on Zoloft but it was not helpful. He had been on a low dose of Ativan for years as well. Finally in the end of May his wife brought him to [facility] for evaluation and from there he was transferred to [facility] geropsychiatry unit.  While on the unit he was diagnosed with severe depression. He was started on a number of medicines including Paxil and Effexor, Seroquel trazodone and Depakote. He also had 4 treatments of ECT while in the hospital and2as an outpatient. His last one was in early June.  Since getting out of the hospital he seems to be doing somewhat better. He is getting out of bed every day and doing things around the house and yard. His family went on a trip to [place] and he refused to go with them. States that he's had a lot of short-term memory loss since having ECT but his wife thinks his mood is much better. He's never had psychotic symptoms such as auditory or visual hallucinations or paranoia but his thoughts were somewhat disorganized. He has never been suicidal or homicidal. He does not drink or use drugs. His wife thinks that he slowly getting better but he still not the person he used to  be  The patient returns for follow-up after 6 months.  He states that he continues to do well.  He was checking his blood pressure at home and getting very high readings.  However when he went to his family doctor yesterday it was 145/89.  She let them know that the cuff he was using at home is too small for him.  He is still somewhat worried about it and I explained that he probably would benefit from exercising more and cutting down on salt.  He is going to buy a larger cuff.  In terms of mood he is doing very well.  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  He denies significant depression suicidal ideation difficulty sleeping anxiety or auditory hallucinations. He denies significant depression suicidal ideation difficulty sleeping anxiety or auditory hallucinations. Visit Diagnosis:    ICD-10-CM   1. Bipolar I disorder, most recent episode mixed ([place])  F31.60   2. Severe single current episode of major depressive disorder, without psychotic features ([place])  F32.2     Past Psychiatric History: Hospitalization in 2015  Past Medical History:  Past Medical History:  Diagnosis Date  . Anxiety   . Atherosclerosis of aorta ([place])   . Atrial contractions, premature   . Depression   . Dyslipidemia   . Elevated glucose 11/11/2015  . Hyperlipidemia   . Mild hypertension   . Urinary hesitancy     Past Surgical History:  Procedure Laterality Date  . COLON RESECTION  2012    Family Psychiatric History: see below  Family History:  Family History  Problem Relation Age of Onset  . Stroke Father   . Bipolar disorder Father   . Hypertension Sister   . Depression Sister   . COPD Mother   . Colon cancer Neg Hx   . Esophageal cancer Neg Hx   . Inflammatory bowel disease Neg Hx   . Liver disease Neg Hx   . Rectal cancer Neg Hx   . Pancreatic cancer Neg Hx   . Stomach cancer Neg Hx     Social History:  Social History   Socioeconomic History  . Marital status: Married    Spouse name: Not on file  . Number of children: 3  . Years of education: Not on file  . Highest education level: Not on file  Occupational History  . Occupation: retired  Tobacco Use  . Smoking status: Former Smoker    Packs/day: 0.20    Years: 19.00    Pack years: 3.80    Types: Cigarettes  . Smokeless tobacco: Never Used  Vaping Use  . Vaping Use: Never used  Substance and Sexual Activity  . Alcohol use: No    Alcohol/week: 0.0 standard drinks  . Drug use: No  . Sexual activity: Not Currently   Other Topics Concern  . Not on file  Social History Narrative  . Not on file   Social Determinants of Health   Financial Resource Strain: Not on file  Food Insecurity: Not on file  Transportation Needs: Not on file  Physical Activity: Not on file  Stress: Not on file  Social Connections: Not on file    Allergies: No Known Allergies  Metabolic Disorder Labs: Lab Results  Component Value Date   HGBA1C 5.9 04/01/2020   No results found for: PROLACTIN Lab Results  Component Value Date   CHOL 171 03/25/2019   TRIG 155 (H) 03/25/2019   HDL 46 03/25/2019  CHOLHDL 3.7 03/25/2019   LDLCALC 98 03/25/2019   LDLCALC 80 08/26/2018   Lab Results  Component Value Date   TSH 1.090 10/07/2019   TSH 1.300 08/26/2018    Therapeutic Level Labs: No results found for: LITHIUM Lab Results  Component Value Date   VALPROATE 41.8 (L) 11/29/2016   VALPROATE 44 (L) 11/11/2015   No components found for:  CBMZ  Current Medications: Current Outpatient Medications  Medication Sig Dispense Refill  . amLODipine (NORVASC) 5 MG tablet Take 1 tablet (5 mg total) by mouth daily. 90 tablet 3  . aspirin EC 81 MG tablet Take 1 tablet (81 mg total) by mouth daily. 90 tablet 3  . atorvastatin (LIPITOR) 80 MG tablet Take 1 tablet (80 mg total) by mouth daily. 90 tablet 3  . divalproex (DEPAKOTE ER) 500 MG 24 hr tablet Take 1 tablet (500 mg total) by mouth at bedtime. 90 tablet 2  . LORazepam (ATIVAN) 0.5 MG tablet Take 1 tablet (0.5 mg total) by mouth 3 (three) times daily. 90 tablet 3  . PARoxetine (PAXIL) 20 MG tablet Take 1 tablet (20 mg total) by mouth at bedtime. 90 tablet 2  . QUEtiapine (SEROQUEL) 50 MG tablet Take 1 tablet (50 mg total) by mouth at bedtime. 90 tablet 2  . tamsulosin (FLOMAX) 0.4 MG CAPS capsule Take 1 capsule (0.4 mg total) by mouth in the morning and at bedtime. 180 capsule 1  . traZODone (DESYREL) 50 MG tablet Take 1 tablet (50 mg total) by mouth at bedtime. 90 tablet 2   . venlafaxine XR (EFFEXOR-XR) 75 MG 24 hr capsule Take 1 capsule (75 mg total) by mouth daily with breakfast. 90 capsule 2   No current facility-administered medications for this visit.     Musculoskeletal: Strength & Muscle Tone: within normal limits Gait & Station: normal Patient leans: N/A  Psychiatric Specialty Exam: Review of Systems  HENT: Positive for hearing loss.   All other systems reviewed and are negative.   There were no vitals taken for this visit.There is no height or weight on file to calculate BMI.  General Appearance: NA  Eye Contact:  NA  Speech:  Clear and Coherent  Volume:  Normal  Mood:  Euthymic  Affect:  NA  Thought Process:  Goal Directed  Orientation:  Full (Time, Place, and Person)  Thought Content: WDL   Suicidal Thoughts:  No  Homicidal Thoughts:  No  Memory:  Immediate;   Good Recent;   Good Remote;   Good  Judgement:  Good  Insight:  Good  Psychomotor Activity:  Normal  Concentration:  Concentration: Good and Attention Span: Good  Recall:  Good  Fund of Knowledge: Good  Language: Good  Akathisia:  No  Handed:  Right  AIMS (if indicated): not done  Assets:  Communication Skills Desire for Improvement Physical Health Resilience Social Support Talents/Skills  ADL's:  Intact  Cognition: WNL  Sleep:  Good   Screenings: GAD-7   Flowsheet Row Office Visit from 08/26/2018 in [place] Visit from 05/26/2018 in [place]  Total GAD-7 Score 0 3    PHQ2-9   [place] Office Visit from 08/17/2020 in [place] Office Visit from 04/01/2020 in [place] Office Visit from 10/07/2019 in [place] Office Visit from 08/07/2019 in [place] Visit from 03/25/2019 in [facility]  PHQ-2 Total Score 0 0 0 0 0  PHQ-9 Total Score 0 -- 0 -- 0       Assessment and Plan:  This patient is a 65 year old male with a history of severe depression requiring ECT in the past. He continues to do well on his current regimen. He will continue lorazepam 0.5 mg 3 times daily for anxiety Paxil 20 mg nightly for depression, Seroquel 50 mg nightly for mood stabilization, trazodone 50 mg nightly for sleep, Effexor XR 75 mg daily for depression and Depakote ER 500 mg daily at bedtime for mood stabilization. He will return to see me in 6 months   Lawrence Spiller, MD 08/18/2020, 1:19 PM

## 2020-09-04 MED FILL — QUETIAPINE FUMARATE 50 MG T: 50 | 90 days supply | Qty: 90 | Fill #1

## 2020-09-26 MED FILL — TAMSULOSIN HCL 0.4 MG CAP: 0.4 | 90 days supply | Qty: 180 | Fill #1

## 2020-09-26 MED FILL — DIVALPROEX SOD ER 500 MG TA: 500 | 90 days supply | Qty: 90 | Fill #1

## 2020-09-26 MED FILL — VENLAFAXINE HCL ER 75 MG CA: 75 | 90 days supply | Qty: 90 | Fill #2

## 2020-09-28 ENCOUNTER — Ambulatory Visit: Payer: Medicare HMO | Admitting: Family Medicine

## 2020-09-28 ENCOUNTER — Encounter: Payer: Self-pay | Admitting: Family Medicine

## 2020-09-28 ENCOUNTER — Other Ambulatory Visit: Payer: Self-pay | Admitting: Family Medicine

## 2020-09-28 ENCOUNTER — Ambulatory Visit (INDEPENDENT_AMBULATORY_CARE_PROVIDER_SITE_OTHER): Payer: Medicare HMO | Admitting: Family Medicine

## 2020-09-28 DIAGNOSIS — J40 Bronchitis, not specified as acute or chronic: Secondary | ICD-10-CM

## 2020-09-28 MED ORDER — BENZONATATE 100 MG PO CAPS: 100.0000 mg | ORAL_CAPSULE | Freq: Two times a day (BID) | ORAL | 0 refills | Status: DC | PRN

## 2020-09-28 MED ORDER — AZITHROMYCIN 250 MG PO TABS: ORAL_TABLET | ORAL | 0 refills | Status: DC

## 2020-09-28 MED FILL — AZITHROMYCIN 250 MG TABLET: 250 | 5 days supply | Qty: 6 | Fill #0

## 2020-09-28 MED FILL — BENZONATATE 100 MG CAPS: 100 | 10 days supply | Qty: 20 | Fill #0

## 2020-09-28 NOTE — Progress Notes (Signed)
Virtual Visit via telephone Note  I connected with [facility] on 09/28/20 at 1723 by telephone and verified that I am speaking with the correct person using two identifiers. [facility] is currently located at home and patient are currently with her during visit. The provider, Fransisca Kaufmann Claudius Mich, MD is located in their office at time of visit.  Call ended at 1730  I discussed the limitations, risks, security and privacy concerns of performing an evaluation and management service by telephone and the availability of in person appointments. I also discussed with the patient that there may be a patient responsible charge related to this service. The patient expressed understanding and agreed to proceed.   History and Present Illness: Patient is calling in for coughing.  He is having nasal drainage and chest congestion.  He is having sore throat.  He has had this for 1 week.  He takes zinc and ibuprofen.  He denies sick contacts.  He denies fevers or chills or SOB or wheezing. This has been going on for 1 week. This is the 1st time he is sick in a while.   No diagnosis found.  Outpatient Encounter Medications as of 09/28/2020  Medication Sig  . amLODipine (NORVASC) 5 MG tablet Take 1 tablet (5 mg total) by mouth daily.  Marland Kitchen aspirin EC 81 MG tablet Take 1 tablet (81 mg total) by mouth daily.  Marland Kitchen atorvastatin (LIPITOR) 80 MG tablet Take 1 tablet (80 mg total) by mouth daily.  . divalproex (DEPAKOTE ER) 500 MG 24 hr tablet Take 1 tablet (500 mg total) by mouth at bedtime.  Marland Kitchen LORazepam (ATIVAN) 0.5 MG tablet Take 1 tablet (0.5 mg total) by mouth 3 (three) times daily.  Marland Kitchen PARoxetine (PAXIL) 20 MG tablet Take 1 tablet (20 mg total) by mouth at bedtime.  Marland Kitchen QUEtiapine (SEROQUEL) 50 MG tablet Take 1 tablet (50 mg total) by mouth at bedtime.  . tamsulosin (FLOMAX) 0.4 MG CAPS capsule Take 1 capsule (0.4 mg total) by mouth in the morning and at bedtime.  . traZODone (DESYREL) 50 MG tablet Take 1  tablet (50 mg total) by mouth at bedtime.  Marland Kitchen venlafaxine XR (EFFEXOR-XR) 75 MG 24 hr capsule Take 1 capsule (75 mg total) by mouth daily with breakfast.   No facility-administered encounter medications on file as of 09/28/2020.    Review of Systems  Constitutional: Negative for chills and fever.  HENT: Positive for congestion, postnasal drip, rhinorrhea and sore throat. Negative for ear discharge, ear pain, sinus pressure, sneezing and voice change.   Eyes: Negative for pain, discharge, redness and visual disturbance.  Respiratory: Positive for cough. Negative for shortness of breath and wheezing.   Cardiovascular: Negative for chest pain and leg swelling.  Musculoskeletal: Negative for gait problem.  Skin: Negative for rash.  All other systems reviewed and are negative.   Observations/Objective: Patient sounds comfortable and in no acute distress  Assessment and Plan: Problem List Items Addressed This Visit   None   Visit Diagnoses    Bronchitis    -  Primary   Relevant Medications   azithromycin (ZITHROMAX) 250 MG tablet   benzonatate (TESSALON) 100 MG capsule      Will treat like bronchitis based on his symptoms is been going on for a week.   Follow up plan: Return if symptoms worsen or fail to improve.     I discussed the assessment and treatment plan with the patient. The patient was provided an opportunity  to ask questions and all were answered. The patient agreed with the plan and demonstrated an understanding of the instructions.   The patient was advised to call back or seek an in-person evaluation if the symptoms worsen or if the condition fails to improve as anticipated.  The above assessment and management plan was discussed with the patient. The patient verbalized understanding of and has agreed to the management plan. Patient is aware to call the clinic if symptoms persist or worsen. Patient is aware when to return to the clinic for a follow-up visit. Patient  educated on when it is appropriate to go to the emergency department.    I provided 7 minutes of non-face-to-face time during this encounter.    Worthy Rancher, MD

## 2020-10-02 MED FILL — Amlodipine Besylate Tab 5 MG (Base Equivalent): ORAL | 90 days supply | Qty: 90 | Fill #0 | Status: AC

## 2020-10-03 ENCOUNTER — Other Ambulatory Visit (HOSPITAL_COMMUNITY): Payer: Self-pay

## 2020-10-04 ENCOUNTER — Other Ambulatory Visit: Payer: Self-pay

## 2020-10-04 ENCOUNTER — Ambulatory Visit (INDEPENDENT_AMBULATORY_CARE_PROVIDER_SITE_OTHER): Payer: Medicare HMO | Admitting: Family Medicine

## 2020-10-04 ENCOUNTER — Encounter: Payer: Self-pay | Admitting: Family Medicine

## 2020-10-04 VITALS — BP 129/84 | HR 93 | Temp 97.4°F | Resp 20 | Ht 70.0 in | Wt 252.0 lb

## 2020-10-04 DIAGNOSIS — N401 Enlarged prostate with lower urinary tract symptoms: Secondary | ICD-10-CM

## 2020-10-04 DIAGNOSIS — F319 Bipolar disorder, unspecified: Secondary | ICD-10-CM | POA: Diagnosis not present

## 2020-10-04 DIAGNOSIS — I10 Essential (primary) hypertension: Secondary | ICD-10-CM | POA: Diagnosis not present

## 2020-10-04 DIAGNOSIS — E782 Mixed hyperlipidemia: Secondary | ICD-10-CM

## 2020-10-04 DIAGNOSIS — N3943 Post-void dribbling: Secondary | ICD-10-CM | POA: Diagnosis not present

## 2020-10-04 DIAGNOSIS — Z5181 Encounter for therapeutic drug level monitoring: Secondary | ICD-10-CM | POA: Diagnosis not present

## 2020-10-04 NOTE — Progress Notes (Signed)
Subjective: CC: HTN f/u; medication monitoring PCP: [name], DO Lawrence Bennett is a 65 y.o. male presenting to clinic today for:  1. HTN and hyperlipidemia Patient is compliant with his amlodipine and Lipitor.  No chest pain, shortness of breath, dizziness.  2. Bipolar 1 disorder Treated with Depakote, Seroquel, Paxil, Effexor and trazodone.  He also has Ativan.  Has not had EKG since 2016 so we will collect today.  Does not report any heart palpitations, dizziness.  3.  BPH Patient is prescribed Flomax 0.4 mg twice daily but thinks he is only been taking this once daily.  He reports sensation of incomplete bladder emptying and post void dribbling.  He is never seen a urologist.  No dysuria, hematuria.   ROS: Per HPI  No Known Allergies Past Medical History:  Diagnosis Date  . Anxiety   . Atherosclerosis of aorta ([place])   . Atrial contractions, premature   . Depression   . Dyslipidemia   . Elevated glucose 11/11/2015  . Hyperlipidemia   . Mild hypertension   . Urinary hesitancy     Current Outpatient Medications:  .  amLODipine (NORVASC) 5 MG tablet, TAKE 1 TABLET BY MOUTH ONCE DAILY, Disp: 90 tablet, Rfl: 3 .  aspirin EC 81 MG tablet, Take 1 tablet (81 mg total) by mouth daily., Disp: 90 tablet, Rfl: 3 .  atorvastatin (LIPITOR) 80 MG tablet, TAKE 1 TABLET (80 MG TOTAL) BY MOUTH DAILY., Disp: 90 tablet, Rfl: 3 .  benzonatate (TESSALON) 100 MG capsule, TAKE 1 CAPSULE BY MOUTH 2 TIMES DAILY AS NEEDED FOR COUGH, Disp: 20 capsule, Rfl: 0 .  divalproex (DEPAKOTE ER) 500 MG 24 hr tablet, TAKE 1 TABLET BY MOUTH AT BEDTIME., Disp: 90 tablet, Rfl: 2 .  LORazepam (ATIVAN) 0.5 MG tablet, TAKE 1 TABLET BY MOUTH 3 TIMES DAILY., Disp: 90 tablet, Rfl: 3 .  PARoxetine (PAXIL) 20 MG tablet, TAKE 1 TABLET BY MOUTH AT BEDTIME., Disp: 90 tablet, Rfl: 2 .  QUEtiapine (SEROQUEL) 50 MG tablet, TAKE 1 TABLET BY MOUTH AT BEDTIME., Disp: 90 tablet, Rfl: 2 .  tamsulosin (FLOMAX) 0.4  MG CAPS capsule, TAKE 1 CAPSULE BY MOUTH IN THE MORNING AND AT BEDTIME, Disp: 180 capsule, Rfl: 1 .  traZODone (DESYREL) 50 MG tablet, TAKE 1 TABLET BY MOUTH AT BEDTIME., Disp: 90 tablet, Rfl: 2 .  venlafaxine XR (EFFEXOR-XR) 75 MG 24 hr capsule, Take 1 capsule (75 mg total) by mouth daily with breakfast., Disp: 90 capsule, Rfl: 2 Social History   Socioeconomic History  . Marital status: Married    Spouse name: Not on file  . Number of children: 3  . Years of education: Not on file  . Highest education level: Not on file  Occupational History  . Occupation: retired  Tobacco Use  . Smoking status: Former Smoker    Packs/day: 0.20    Years: 19.00    Pack years: 3.80    Types: Cigarettes  . Smokeless tobacco: Never Used  Vaping Use  . Vaping Use: Never used  Substance and Sexual Activity  . Alcohol use: No    Alcohol/week: 0.0 standard drinks  . Drug use: No  . Sexual activity: Not Currently  Other Topics Concern  . Not on file  Social History Narrative  . Not on file   Social Determinants of Health   Financial Resource Strain: Not on file  Food Insecurity: Not on file  Transportation Needs: Not on file  Physical Activity: Not on  file  Stress: Not on file  Social Connections: Not on file  Intimate Partner Violence: Not on file   Family History  Problem Relation Age of Onset  . Stroke Father   . Bipolar disorder Father   . Hypertension Sister   . Depression Sister   . COPD Mother   . Colon cancer Neg Hx   . Esophageal cancer Neg Hx   . Inflammatory bowel disease Neg Hx   . Liver disease Neg Hx   . Rectal cancer Neg Hx   . Pancreatic cancer Neg Hx   . Stomach cancer Neg Hx     Objective: Office vital signs reviewed. BP 129/84   Pulse 93   Temp (!) 97.4 F (36.3 C)   Resp 20   Ht $R'5\' 10"'HP$  (1.778 m)   Wt 252 lb (114.3 kg)   SpO2 95%   BMI 36.16 kg/m   Physical Examination:  General: Awake, alert, well nourished, No acute distress HEENT: Normal; right  ear with scant cerumen and flaking.  Oropharynx without masses.  No erythema.  Moist mucous membranes.  Sclera white. Cardio: regular rate and rhythm, S1S2 heard, no murmurs appreciated Pulm: clear to auscultation bilaterally, no wheezes, rhonchi or rales; normal work of breathing on room air GI: soft, non-tender, non-distended, bowel sounds present x4, no hepatomegaly, no splenomegaly, no masses GU: Nontender bladder. Extremities: warm, well perfused, trace pedal edema, no cyanosis or clubbing; +2 pulses bilaterally MSK: Normal gait and station Neuro: Hard of hearing  Assessment/ Plan: 65 y.o. male   Essential hypertension - Plan: CMP14+EGFR, Lipid panel, EKG 12-Lead  Bipolar 1 disorder (HCC) - Plan: CMP14+EGFR, Lipid panel, CBC, EKG 12-Lead  Mixed hyperlipidemia - Plan: CMP14+EGFR, Lipid panel  Benign prostatic hyperplasia with post-void dribbling - Plan: Urinalysis, PSA  Medication monitoring encounter - Plan: CMP14+EGFR, Lipid panel, CBC  Blood pressures well controlled with our cuff.  He was measured with his cuff and there was about at 20 point discrepancy in the systolic of a 10 point discrepancy in the diastolic.  I suspect this is due to small cuff and have reiterated that he needs to have a large cuff to measure blood pressure.  EKG was obtained given use of atypical antipsychotics and other medications which could prolong QTC.  QTC was normal.  I could not appreciate any atypical ST or T wave changes despite automated reading.  Plan for UA with lab collection.  Advised to take the Flomax as directed.  Offered referral to urology he would like to hold off on this.  He will contact me if he changes his mind  He will come in for fasting labs.  No orders of the defined types were placed in this encounter.  No orders of the defined types were placed in this encounter.    [name], DO [place] [phone]

## 2020-10-04 NOTE — Patient Instructions (Signed)
Make sure you are using the flomax TWICE daily.  If you are still having problems with urination, let me know and I will send you to a urologist (prostate/ bladder specialist)  Come in [place] (that means no food or drink EXCEPT water for 8 hours before labs)  Ask the pharmacist to get you a LARGE CUFF for your blood pressure machine.  I think the one you have is too small and that's why your blood pressure readings are not accurate.

## 2020-10-04 NOTE — Progress Notes (Signed)
thanks

## 2020-10-06 ENCOUNTER — Other Ambulatory Visit (HOSPITAL_COMMUNITY): Payer: Self-pay

## 2020-10-07 ENCOUNTER — Other Ambulatory Visit: Payer: Self-pay

## 2020-10-07 ENCOUNTER — Other Ambulatory Visit: Payer: Medicare HMO

## 2020-10-07 ENCOUNTER — Other Ambulatory Visit (HOSPITAL_COMMUNITY): Payer: Self-pay

## 2020-10-07 DIAGNOSIS — E782 Mixed hyperlipidemia: Secondary | ICD-10-CM

## 2020-10-07 DIAGNOSIS — N3943 Post-void dribbling: Secondary | ICD-10-CM

## 2020-10-07 DIAGNOSIS — I10 Essential (primary) hypertension: Secondary | ICD-10-CM

## 2020-10-07 DIAGNOSIS — F319 Bipolar disorder, unspecified: Secondary | ICD-10-CM | POA: Diagnosis not present

## 2020-10-07 DIAGNOSIS — Z5181 Encounter for therapeutic drug level monitoring: Secondary | ICD-10-CM | POA: Diagnosis not present

## 2020-10-07 DIAGNOSIS — N401 Enlarged prostate with lower urinary tract symptoms: Secondary | ICD-10-CM | POA: Diagnosis not present

## 2020-10-07 LAB — URINALYSIS
Bilirubin, UA: NEGATIVE
Glucose, UA: NEGATIVE
Ketones, UA: NEGATIVE
Leukocytes,UA: NEGATIVE
Nitrite, UA: NEGATIVE
Protein,UA: NEGATIVE
Specific Gravity, UA: 1.015 (ref 1.005–1.030)
Urobilinogen, Ur: 0.2 mg/dL (ref 0.2–1.0)
pH, UA: 7 (ref 5.0–7.5)

## 2020-10-08 LAB — CBC
Hematocrit: 44.7 % (ref 37.5–51.0)
Hemoglobin: 15.2 g/dL (ref 13.0–17.7)
MCH: 30.1 pg (ref 26.6–33.0)
MCHC: 34 g/dL (ref 31.5–35.7)
MCV: 89 fL (ref 79–97)
Platelets: 304 10*3/uL (ref 150–450)
RBC: 5.05 x10E6/uL (ref 4.14–5.80)
RDW: 14.2 % (ref 11.6–15.4)
WBC: 6.2 10*3/uL (ref 3.4–10.8)

## 2020-10-08 LAB — CMP14+EGFR
ALT: 29 IU/L (ref 0–44)
AST: 20 IU/L (ref 0–40)
Albumin/Globulin Ratio: 1.7 (ref 1.2–2.2)
Albumin: 4 g/dL (ref 3.8–4.8)
Alkaline Phosphatase: 103 IU/L (ref 44–121)
BUN/Creatinine Ratio: 7 — ABNORMAL LOW (ref 10–24)
BUN: 7 mg/dL — ABNORMAL LOW (ref 8–27)
Bilirubin Total: 0.3 mg/dL (ref 0.0–1.2)
CO2: 21 mmol/L (ref 20–29)
Calcium: 9.2 mg/dL (ref 8.6–10.2)
Chloride: 101 mmol/L (ref 96–106)
Creatinine, Ser: 0.97 mg/dL (ref 0.76–1.27)
Globulin, Total: 2.4 g/dL (ref 1.5–4.5)
Glucose: 102 mg/dL — ABNORMAL HIGH (ref 65–99)
Potassium: 4.5 mmol/L (ref 3.5–5.2)
Sodium: 138 mmol/L (ref 134–144)
Total Protein: 6.4 g/dL (ref 6.0–8.5)
eGFR: 86 mL/min/{1.73_m2} (ref >59)

## 2020-10-08 LAB — PSA: Prostate Specific Ag, Serum: 1.8 ng/mL (ref 0.0–4.0)

## 2020-10-08 LAB — LIPID PANEL
Chol/HDL Ratio: 3.5 ratio (ref 0.0–5.0)
Cholesterol, Total: 156 mg/dL (ref 100–199)
HDL: 45 mg/dL (ref >39)
LDL Chol Calc (NIH): 92 mg/dL (ref 0–99)
Triglycerides: 103 mg/dL (ref 0–149)
VLDL Cholesterol Cal: 19 mg/dL (ref 5–40)

## 2020-10-08 MED FILL — Atorvastatin Calcium Tab 80 MG (Base Equivalent): ORAL | 90 days supply | Qty: 90 | Fill #0 | Status: AC

## 2020-10-10 ENCOUNTER — Other Ambulatory Visit (HOSPITAL_COMMUNITY): Payer: Self-pay

## 2020-10-10 ENCOUNTER — Telehealth: Payer: Self-pay

## 2020-10-10 MED FILL — Paroxetine HCl Tab 20 MG: ORAL | 90 days supply | Qty: 90 | Fill #0 | Status: AC

## 2020-10-10 MED FILL — Trazodone HCl Tab 50 MG: ORAL | 90 days supply | Qty: 90 | Fill #0 | Status: AC

## 2020-10-12 NOTE — Telephone Encounter (Signed)
Results and provider comments were seen by patient via [place]

## 2020-10-15 ENCOUNTER — Other Ambulatory Visit (HOSPITAL_COMMUNITY): Payer: Self-pay

## 2020-10-15 MED FILL — Lorazepam Tab 0.5 MG: ORAL | 30 days supply | Qty: 90 | Fill #0 | Status: AC

## 2020-11-18 MED FILL — Lorazepam Tab 0.5 MG: ORAL | 30 days supply | Qty: 90 | Fill #1 | Status: AC

## 2020-11-19 ENCOUNTER — Other Ambulatory Visit (HOSPITAL_COMMUNITY): Payer: Self-pay

## 2020-12-06 ENCOUNTER — Other Ambulatory Visit (HOSPITAL_BASED_OUTPATIENT_CLINIC_OR_DEPARTMENT_OTHER): Payer: Self-pay

## 2020-12-06 ENCOUNTER — Other Ambulatory Visit (HOSPITAL_COMMUNITY): Payer: Self-pay

## 2020-12-06 MED FILL — Quetiapine Fumarate Tab 50 MG: ORAL | 90 days supply | Qty: 90 | Fill #0 | Status: AC

## 2020-12-16 ENCOUNTER — Encounter: Payer: Self-pay | Admitting: Family Medicine

## 2020-12-16 ENCOUNTER — Ambulatory Visit (INDEPENDENT_AMBULATORY_CARE_PROVIDER_SITE_OTHER): Payer: Medicare HMO | Admitting: Family Medicine

## 2020-12-16 ENCOUNTER — Other Ambulatory Visit: Payer: Self-pay

## 2020-12-16 VITALS — BP 138/78 | HR 93 | Temp 97.9°F | Ht 70.0 in | Wt 256.5 lb

## 2020-12-16 DIAGNOSIS — S80922A Unspecified superficial injury of left lower leg, initial encounter: Secondary | ICD-10-CM | POA: Diagnosis not present

## 2020-12-16 DIAGNOSIS — H00011 Hordeolum externum right upper eyelid: Secondary | ICD-10-CM

## 2020-12-16 DIAGNOSIS — T148XXA Other injury of unspecified body region, initial encounter: Secondary | ICD-10-CM

## 2020-12-16 NOTE — Progress Notes (Signed)
Acute Office Visit  Subjective:    Patient ID: Lawrence Bennett, male    DOB: May 01, 1956, 65 y.o.   MRN: [id number]  Chief Complaint  Patient presents with   Eye Problem    HPI Patient is in today for swelling of his right eyelid for 2 days. It was on her lower eye lid yesterday and today it is his upper eye lid. He reports that the swelling has improved significantly. He has been using warm compresses. He did have some drainage today that was yellow. There is some mild pain when he closes his eye, it just feels like a pressure. Denies changes in his vision.   He also reports a knot on her lower left leg for 2-3 weeks. This happened after he tripped while carrying an [facility] unit. The hit his leg in the process. He denies pain with walking. Denies numbness or tingling. He is able to walk without difficulty. He has been using ice to the area.   Past Medical History:  Diagnosis Date   Anxiety    Atherosclerosis of aorta (HCC)    Atrial contractions, premature    Depression    Dyslipidemia    Elevated glucose 11/11/2015   Hyperlipidemia    Mild hypertension    Urinary hesitancy     Past Surgical History:  Procedure Laterality Date   COLON RESECTION  2012    Family History  Problem Relation Age of Onset   Stroke Father    Bipolar disorder Father    Hypertension Sister    Depression Sister    COPD Mother    Colon cancer Neg Hx    Esophageal cancer Neg Hx    Inflammatory bowel disease Neg Hx    Liver disease Neg Hx    Rectal cancer Neg Hx    Pancreatic cancer Neg Hx    Stomach cancer Neg Hx     Social History   Socioeconomic History   Marital status: Married    Spouse name: Not on file   Number of children: 3   Years of education: Not on file   Highest education level: Not on file  Occupational History   Occupation: retired  Tobacco Use   Smoking status: Former    Packs/day: 0.20    Years: 19.00    Pack years: 3.80    Types: Cigarettes   Smokeless tobacco: Never   Vaping Use   Vaping Use: Never used  Substance and Sexual Activity   Alcohol use: No    Alcohol/week: 0.0 standard drinks   Drug use: No   Sexual activity: Not Currently  Other Topics Concern   Not on file  Social History Narrative   Not on file   Social Determinants of Health   Financial Resource Strain: Not on file  Food Insecurity: Not on file  Transportation Needs: Not on file  Physical Activity: Not on file  Stress: Not on file  Social Connections: Not on file  Intimate Partner Violence: Not on file    Outpatient Medications Prior to Visit  Medication Sig Dispense Refill   amLODipine (NORVASC) 5 MG tablet TAKE 1 TABLET BY MOUTH ONCE DAILY 90 tablet 3   aspirin EC 81 MG tablet Take 1 tablet (81 mg total) by mouth daily. 90 tablet 3   atorvastatin (LIPITOR) 80 MG tablet TAKE 1 TABLET (80 MG TOTAL) BY MOUTH DAILY. 90 tablet 3   divalproex (DEPAKOTE ER) 500 MG 24 hr tablet TAKE 1 TABLET BY MOUTH AT  BEDTIME. 90 tablet 2   LORazepam (ATIVAN) 0.5 MG tablet TAKE 1 TABLET BY MOUTH 3 TIMES DAILY. 90 tablet 3   PARoxetine (PAXIL) 20 MG tablet TAKE 1 TABLET BY MOUTH AT BEDTIME. 90 tablet 2   QUEtiapine (SEROQUEL) 50 MG tablet TAKE 1 TABLET BY MOUTH AT BEDTIME. 90 tablet 2   traZODone (DESYREL) 50 MG tablet TAKE 1 TABLET BY MOUTH AT BEDTIME. 90 tablet 2   venlafaxine XR (EFFEXOR-XR) 75 MG 24 hr capsule Take 1 capsule (75 mg total) by mouth daily with breakfast. 90 capsule 2   No facility-administered medications prior to visit.    No Known Allergies  Review of Systems As per HPI.    Objective:    Physical Exam Vitals and nursing note reviewed.  Constitutional:      General: He is not in acute distress.    Appearance: He is not ill-appearing, toxic-appearing or diaphoretic.  HENT:     Head: Normocephalic and atraumatic.  Eyes:     General:        Right eye: Hordeolum present. No discharge.     Extraocular Movements: Extraocular movements intact.     Right eye: Normal  extraocular motion.     Left eye: Normal extraocular motion.     Conjunctiva/sclera: Conjunctivae normal.     Right eye: Right conjunctiva is not injected. No exudate or hemorrhage.    Pupils: Pupils are equal, round, and reactive to light.  Cardiovascular:     Rate and Rhythm: Normal rate and regular rhythm.     Heart sounds: Normal heart sounds. No murmur heard. Pulmonary:     Effort: Pulmonary effort is normal. No respiratory distress.     Breath sounds: Normal breath sounds.  Musculoskeletal:       Legs:     Comments: Hematoma present. No warmth, erythema,  or tenderness. No wound or drainage. No bony tenderness. Sensation intact.   Skin:    General: Skin is warm and dry.  Neurological:     General: No focal deficit present.     Mental Status: He is alert and oriented to person, place, and time.  Psychiatric:        Mood and Affect: Mood normal.        Behavior: Behavior normal.    BP 138/78   Pulse 93   Temp 97.9 F (36.6 C) (Oral)   Ht _0  (1.778 m)   Wt 256 lb 8 oz (116.3 kg)   BMI 36.80 kg/m  Wt Readings from Last 3 Encounters:  12/16/20 256 lb 8 oz (116.3 kg)  10/04/20 252 lb (114.3 kg)  08/17/20 255 lb (115.7 kg)    Health Maintenance Due  Topic Date Due   Zoster Vaccines- Shingrix (1 of 2) Never done   COLONOSCOPY (Pts 45-74yr Insurance coverage will need to be confirmed)  04/04/2020   COVID-19 Vaccine (4 - Booster for PStinnettseries) 10/19/2020    There are no preventive care reminders to display for this patient.   Lab Results  Component Value Date   TSH 1.090 10/07/2019   Lab Results  Component Value Date   WBC 6.2 10/07/2020   HGB 15.2 10/07/2020   HCT 44.7 10/07/2020   MCV 89 10/07/2020   PLT 304 10/07/2020   Lab Results  Component Value Date   NA 138 10/07/2020   K 4.5 10/07/2020   CO2 21 10/07/2020   GLUCOSE 102 (H) 10/07/2020   BUN 7 (L) 10/07/2020   CREATININE 0.97  10/07/2020   BILITOT 0.3 10/07/2020   ALKPHOS 103 10/07/2020    AST 20 10/07/2020   ALT 29 10/07/2020   PROT 6.4 10/07/2020   ALBUMIN 4.0 10/07/2020   CALCIUM 9.2 10/07/2020   ANIONGAP 11 11/10/2014   EGFR 86 10/07/2020   GFR 70.81 12/10/2019   Lab Results  Component Value Date   CHOL 156 10/07/2020   Lab Results  Component Value Date   HDL 45 10/07/2020   Lab Results  Component Value Date   LDLCALC 92 10/07/2020   Lab Results  Component Value Date   TRIG 103 10/07/2020   Lab Results  Component Value Date   CHOLHDL 3.5 10/07/2020   Lab Results  Component Value Date   HGBA1C 5.9 04/01/2020       Assessment & Plan:   Burdett was seen today for eye problem.  Diagnoses and all orders for this visit:  Hordeolum externum of right upper eyelid Stye present. Discussed warm compresses, washing lids with baby soap, OTC eye lubrication drops as needed. No signs of infection today.   Hematoma Left lower leg. Discussed warm compresses, elevation.   Return to office for new or worsening symptoms, or if symptoms persist.   The patient indicates understanding of these issues and agrees with the plan.    Gwenlyn Perking, FNP

## 2020-12-16 NOTE — Patient Instructions (Signed)
Stye A stye, also known as a hordeolum, is a bump that forms on an eyelid. It may look like a pimple next to the eyelash. A stye can form inside the eyelid (internal stye) or outside the eyelid (external stye). A stye can cause redness, swelling, and pain on the eyelid. Styes are very common. Anyone can get them at any age. They usually occur injust one eye, but you may have more than one in either eye. What are the causes? A stye is caused by an infection. The infection is almost always caused by bacteria called Staphylococcus aureus.This is a common type of bacteria that lives on the skin. An internal stye may result from an infected oil-producing gland inside the eyelid. An external stye may be caused by an infection at the base of the eyelash (hair follicle). What increases the risk? You are more likely to develop a stye if: You have had a stye before. You have any of these conditions: Diabetes. Red, itchy, inflamed eyelids (blepharitis). A skin condition such as seborrheic dermatitis or rosacea. High fat levels in your blood (lipids). What are the signs or symptoms? The most common symptom of a stye is eyelid pain. Internal styes are more painful than external styes. Other symptoms may include: Painful swelling of your eyelid. A scratchy feeling in your eye. Tearing and redness of your eye. Pus draining from the stye. How is this diagnosed? Your health care provider may be able to diagnose a stye just by examining your eye. The health care provider may also check to make sure: You do not have a fever or other signs of a more serious infection. The infection has not spread to other parts of your eye or areas around your eye. How is this treated? Most styes will clear up in a few days without treatment or with warm compresses applied to the area. You may need to use antibiotic drops orointment to treat an infection. In some cases, if your stye does not heal with routine treatment, your  health care provider may drain pus from the stye using a thin blade or needle. This may be done if the stye is large, causing a lot of pain, or affecting yourvision. Follow these instructions at home: Take over-the-counter and prescription medicines only as told by your health care provider. This includes eye drops or ointments. If you were prescribed an antibiotic medicine, apply or use it as told by your health care provider. Do not stop using the antibiotic even if your condition improves. Apply a warm, wet cloth (warm compress) to your eye for 5-10 minutes, 4 times a day. Clean the affected eyelid as directed by your health care provider. Do not wear contact lenses or eye makeup until your stye has healed. Do not try to pop or drain the stye. Do not rub your eye. Contact a health care provider if: You have chills or a fever. Your stye does not go away after several days. Your stye affects your vision. Your eyeball becomes swollen, red, or painful. Get help right away if: You have pain when moving your eye around. Summary A stye is a bump that forms on an eyelid. It may look like a pimple next to the eyelash. A stye can form inside the eyelid (internal stye) or outside the eyelid (external stye). A stye can cause redness, swelling, and pain on the eyelid. Your health care provider may be able to diagnose a stye just by examining your eye. Apply a warm,  wet cloth (warm compress) to your eye for 5-10 minutes, 4 times a day. This information is not intended to replace advice given to you by your health care provider. Make sure you discuss any questions you have with your healthcare provider. Document Revised: 01/06/2020 Document Reviewed: 02/25/2020 Elsevier Patient Education  2022 [employer].   Hematoma A hematoma is a collection of blood under the skin, in an organ, in a body space, in a joint space, or in other tissue. The blood can thicken (clot) to form a lump that you can see and  feel. The lump is often firm and may become sore and tender. Most hematomas get better in a few days to weeks. However, some hematomas may be serious and require medical care. Hematomas canrange from very small to very large. What are the causes? This condition is caused by: A blunt or penetrating injury. A leakage from a blood vessel under the skin. Some medical procedures, including surgeries, such as oral surgery, face lifts, and surgeries on the joints. Some medical conditions that cause bleeding or bruising. There may be multiple hematomas that appear in different areas of the body. What increases the risk? You are more likely to develop this condition if: You are an older adult. You use blood thinners. What are the signs or symptoms?  Symptoms of this condition depend on where the hematoma is located.  Common symptoms of a hematoma that is under the skin include: A firm lump on the body. Pain and tenderness in the area. Bruising. Blue, dark blue, purple-red, or yellowish skin (discoloration) may appear at the site of the hematoma if the hematoma is close to the surface of the skin. Common symptoms of a hematoma that is deep in the tissues or body spaces may be less obvious. They include: A collection of blood in the stomach (intra-abdominal hematoma). This may cause pain in the abdomen, weakness, fainting, and shortness of breath. A collection of blood in the head (intracranial hematoma). This may cause a headache or symptoms such as weakness, trouble speaking or understanding, or a change in consciousness.  How is this diagnosed? This condition is diagnosed based on: Your medical history. A physical exam. Imaging tests, such as an ultrasound or CT scan. These may be needed if your health care provider suspects a hematoma in deeper tissues or body spaces. Blood tests. These may be needed if your health care provider believes that the hematoma is caused by a medical condition. How is  this treated? Treatment for this condition depends on the cause, size, and location of the hematoma. Treatment may include: Doing nothing. The majority of hematomas do not need treatment as many of them go away on their own over time. Surgery or close monitoring. This may be needed for large hematomas or hematomas that affect vital organs. Medicines. Medicines may be given if there is an underlying medical cause for the hematoma. Follow these instructions at home: Managing pain, stiffness, and swelling  If directed, put ice on the affected area. Put ice in a plastic bag. Place a towel between your skin and the bag. Leave the ice on for 20 minutes, 2-3 times a day for the first couple of days. If directed, apply heat to the affected area after applying ice for a couple of days. Use the heat source that your health care provider recommends, such as a moist heat pack or a heating pad. Place a towel between your skin and the heat source. Leave the heat  on for 20-30 minutes. Remove the heat if your skin turns bright red. This is especially important if you are unable to feel pain, heat, or cold. You may have a greater risk of getting burned. Raise (elevate) the affected area above the level of your heart while you are sitting or lying down. If told, wrap the affected area with an elastic bandage. The bandage applies pressure (compression) to the area, which may help to reduce swelling and promote healing. Do not wrap the bandage too tightly around the affected area. If your hematoma is on a leg or foot (lower extremity) and is painful, your health care provider may recommend crutches. Use them as told by your health care provider.  General instructions Take over-the-counter and prescription medicines only as told by your health care provider. Keep all follow-up visits as told by your health care provider. This is important. Contact a health care provider if: You have a fever. The swelling or  discoloration gets worse. You develop more hematomas. Get help right away if: Your pain is worse or your pain is not controlled with medicine. Your skin over the hematoma breaks or starts bleeding. Your hematoma is in your chest or abdomen and you have weakness, shortness of breath, or a change in consciousness. You have a hematoma on your scalp that is caused by a fall or injury, and you also have: A headache that gets worse. Trouble speaking or understanding speech. Weakness. Change in alertness or consciousness. Summary A hematoma is a collection of blood under the skin, in an organ, in a body space, in a joint space, or in other tissue. This condition usually does not need treatment because many hematomas go away on their own over time. Large hematomas, or those that may affect vital organs, may need surgical drainage or monitoring. If the hematoma is caused by a medical condition, medicines may be prescribed. Get help right away if your hematoma breaks or starts to bleed, you have shortness of breath, or you have a headache or trouble speaking after a fall. This information is not intended to replace advice given to you by your health care provider. Make sure you discuss any questions you have with your healthcare provider. Document Revised: 11/12/2018 Document Reviewed: 11/21/2017 Elsevier Patient Education  2022 [employer].

## 2020-12-22 ENCOUNTER — Ambulatory Visit (INDEPENDENT_AMBULATORY_CARE_PROVIDER_SITE_OTHER): Payer: Medicare HMO

## 2020-12-22 ENCOUNTER — Ambulatory Visit (INDEPENDENT_AMBULATORY_CARE_PROVIDER_SITE_OTHER): Payer: Medicare HMO | Admitting: Family Medicine

## 2020-12-22 ENCOUNTER — Other Ambulatory Visit (HOSPITAL_COMMUNITY): Payer: Self-pay

## 2020-12-22 ENCOUNTER — Encounter: Payer: Self-pay | Admitting: Family Medicine

## 2020-12-22 VITALS — BP 159/94 | HR 90 | Temp 98.1°F

## 2020-12-22 DIAGNOSIS — M79662 Pain in left lower leg: Secondary | ICD-10-CM

## 2020-12-22 DIAGNOSIS — J22 Unspecified acute lower respiratory infection: Secondary | ICD-10-CM

## 2020-12-22 DIAGNOSIS — B9689 Other specified bacterial agents as the cause of diseases classified elsewhere: Secondary | ICD-10-CM | POA: Diagnosis not present

## 2020-12-22 DIAGNOSIS — M7989 Other specified soft tissue disorders: Secondary | ICD-10-CM | POA: Diagnosis not present

## 2020-12-22 DIAGNOSIS — R059 Cough, unspecified: Secondary | ICD-10-CM | POA: Diagnosis not present

## 2020-12-22 MED ORDER — AZITHROMYCIN 250 MG PO TABS
ORAL_TABLET | ORAL | 0 refills | Status: DC
  Filled 2020-12-22: qty 6, 5d supply, fill #0

## 2020-12-22 MED ORDER — METHYLPREDNISOLONE 4 MG PO TABS
ORAL_TABLET | ORAL | 0 refills | Status: DC
  Filled 2020-12-22: qty 21, 6d supply, fill #0

## 2020-12-22 MED FILL — Lorazepam Tab 0.5 MG: ORAL | 30 days supply | Qty: 90 | Fill #2 | Status: AC

## 2020-12-22 MED FILL — Divalproex Sodium Tab ER 24 HR 500 MG: ORAL | 90 days supply | Qty: 90 | Fill #0 | Status: AC

## 2020-12-22 NOTE — Progress Notes (Signed)
Assessment & Plan:  1. Bacterial lower respiratory infection Treating with antibiotics due to prolonged duration. Symptom management.  - azithromycin (ZITHROMAX Z-PAK) 250 MG tablet; Take 2 tablets (500 mg) PO today, then 1 tablet (250 mg) PO daily x4 days.  Dispense: 6 tablet; Refill: 0 - methylPREDNISolone (MEDROL DOSEPAK) 4 MG TBPK tablet; Use as directed.  Dispense: 21 each; Refill: 0  2. Cough - Novel Coronavirus, NAA ([facility])  3. Pain of left lower leg X-ray to r/o fracture. [place] to r/o DVT.  - DG Tibia/Fibula Left; Future - US Venous Img Lower Unilateral Left; Future   Follow up plan: Return if symptoms worsen or fail to improve.  Hendricks Limes, MSN, APRN, FNP-C [facility]  Subjective:   Patient ID: Lawrence Bennett, male    DOB: [birth year], 65 y.o.   MRN: [id number]  HPI: Lawrence Bennett is a 65 y.o. male presenting on 12/22/2020 for chest congestion , Cough (X 1 week//), and Fall (X 3 weeks ago )  Patient complains of cough and chest congestion. Onset of symptoms was  greater than 1  week ago, gradually worsening since that time. He is drinking plenty of fluids. Evaluation to date: none. He does not smoke. Patient has been fully vaccinated against COVID-19. His wife and son are both sick as well.   Patient also reports a fall 3 weeks ago. He was going down some steps and missed the last one. At that time he developed a large hematoma on the left shin. He reports today his leg is continuing to swell and have pain.    ROS: Negative unless specifically indicated above in HPI.   Relevant past medical history reviewed and updated as indicated.   Allergies and medications reviewed and updated.   Current Outpatient Medications:    amLODipine (NORVASC) 5 MG tablet, TAKE 1 TABLET BY MOUTH ONCE DAILY, Disp: 90 tablet, Rfl: 3   aspirin EC 81 MG tablet, Take 1 tablet (81 mg total) by mouth daily., Disp: 90 tablet, Rfl: 3   atorvastatin (LIPITOR) 80 MG  tablet, TAKE 1 TABLET (80 MG TOTAL) BY MOUTH DAILY., Disp: 90 tablet, Rfl: 3   divalproex (DEPAKOTE ER) 500 MG 24 hr tablet, TAKE 1 TABLET BY MOUTH AT BEDTIME., Disp: 90 tablet, Rfl: 2   LORazepam (ATIVAN) 0.5 MG tablet, TAKE 1 TABLET BY MOUTH 3 TIMES DAILY., Disp: 90 tablet, Rfl: 3   PARoxetine (PAXIL) 20 MG tablet, TAKE 1 TABLET BY MOUTH AT BEDTIME., Disp: 90 tablet, Rfl: 2   QUEtiapine (SEROQUEL) 50 MG tablet, TAKE 1 TABLET BY MOUTH AT BEDTIME., Disp: 90 tablet, Rfl: 2   traZODone (DESYREL) 50 MG tablet, TAKE 1 TABLET BY MOUTH AT BEDTIME., Disp: 90 tablet, Rfl: 2   venlafaxine XR (EFFEXOR-XR) 75 MG 24 hr capsule, Take 1 capsule (75 mg total) by mouth daily with breakfast., Disp: 90 capsule, Rfl: 2  No Known Allergies  Objective:   BP (!) 159/94   Pulse 90   Temp 98.1 F (36.7 C) (Temporal)   SpO2 96%    Physical Exam Vitals reviewed.  Constitutional:      General: He is not in acute distress.    Appearance: Normal appearance. He is not ill-appearing, toxic-appearing or diaphoretic.  HENT:     Head: Normocephalic and atraumatic.  Eyes:     General: No scleral icterus.       Right eye: No discharge.        Left eye: No discharge.  Conjunctiva/sclera: Conjunctivae normal.  Cardiovascular:     Rate and Rhythm: Normal rate and regular rhythm.     Heart sounds: Normal heart sounds. No murmur heard.   No friction rub. No gallop.  Pulmonary:     Effort: Pulmonary effort is normal. No respiratory distress.     Breath sounds: Normal breath sounds. No stridor. No wheezing, rhonchi or rales.  Musculoskeletal:        General: Normal range of motion.     Cervical back: Normal range of motion.     Left lower leg: Swelling and bony tenderness present.     Comments: Hematoma to LLE. Warmth with the swelling. No erythema. Negative Homan's Sign.  Skin:    General: Skin is warm and dry.  Neurological:     Mental Status: He is alert and oriented to person, place, and time. Mental status  is at baseline.  Psychiatric:        Mood and Affect: Mood normal.        Behavior: Behavior normal.        Thought Content: Thought content normal.        Judgment: Judgment normal.

## 2020-12-23 ENCOUNTER — Other Ambulatory Visit: Payer: Self-pay

## 2020-12-23 ENCOUNTER — Ambulatory Visit (HOSPITAL_COMMUNITY)
Admission: RE | Admit: 2020-12-23 | Discharge: 2020-12-23 | Disposition: A | Discharge status: Home / Self Care | Payer: Medicare HMO | Source: Ambulatory Visit | Attending: Family Medicine | Admitting: Family Medicine

## 2020-12-23 DIAGNOSIS — M79662 Pain in left lower leg: Secondary | ICD-10-CM | POA: Diagnosis not present

## 2020-12-23 DIAGNOSIS — R600 Localized edema: Secondary | ICD-10-CM | POA: Diagnosis not present

## 2020-12-24 LAB — NOVEL CORONAVIRUS, NAA: SARS-CoV-2, NAA: NOT DETECTED

## 2020-12-24 LAB — SARS-COV-2, NAA 2 DAY TAT

## 2020-12-26 ENCOUNTER — Telehealth: Payer: Self-pay | Admitting: Family Medicine

## 2020-12-26 NOTE — Progress Notes (Signed)
Pt calling back about labs  

## 2020-12-26 NOTE — Telephone Encounter (Signed)
Pt returned missed call. Reviewed [place] result with pt per Britneys notes. Pt voiced understanding. Wants to know what next steps are?  Please advise and call patient.

## 2020-12-27 NOTE — Telephone Encounter (Signed)
LMTCB

## 2020-12-27 NOTE — Telephone Encounter (Signed)
His leg will continue to improve. He just had a big hit to it with his fall. Tylenol/Ibuprofen for pain.

## 2021-01-08 MED FILL — Paroxetine HCl Tab 20 MG: ORAL | 90 days supply | Qty: 90 | Fill #1 | Status: AC

## 2021-01-09 ENCOUNTER — Other Ambulatory Visit (HOSPITAL_COMMUNITY): Payer: Self-pay

## 2021-01-16 ENCOUNTER — Other Ambulatory Visit (HOSPITAL_BASED_OUTPATIENT_CLINIC_OR_DEPARTMENT_OTHER): Payer: Self-pay

## 2021-01-16 ENCOUNTER — Other Ambulatory Visit (HOSPITAL_COMMUNITY): Payer: Self-pay

## 2021-01-16 ENCOUNTER — Other Ambulatory Visit (HOSPITAL_COMMUNITY): Payer: Self-pay | Admitting: Psychiatry

## 2021-01-16 ENCOUNTER — Other Ambulatory Visit: Payer: Self-pay | Admitting: Family Medicine

## 2021-01-16 DIAGNOSIS — I10 Essential (primary) hypertension: Secondary | ICD-10-CM

## 2021-01-16 MED FILL — Trazodone HCl Tab 50 MG: ORAL | 90 days supply | Qty: 90 | Fill #1 | Status: AC

## 2021-01-17 ENCOUNTER — Other Ambulatory Visit (HOSPITAL_COMMUNITY): Payer: Self-pay

## 2021-01-17 ENCOUNTER — Other Ambulatory Visit (HOSPITAL_COMMUNITY): Payer: Self-pay | Admitting: Psychiatry

## 2021-01-17 MED ORDER — AMLODIPINE BESYLATE 5 MG PO TABS
ORAL_TABLET | Freq: Every day | ORAL | 0 refills | Status: DC
Start: 2021-01-17 — End: 2021-03-10
  Filled 2021-01-17: qty 90, 90d supply, fill #0

## 2021-01-17 MED ORDER — VENLAFAXINE HCL ER 75 MG PO CP24
75.0000 mg | ORAL_CAPSULE | Freq: Every day | ORAL | 2 refills | Status: DC
  Filled 2021-01-17: qty 90, 90d supply, fill #0

## 2021-01-23 ENCOUNTER — Other Ambulatory Visit (HOSPITAL_COMMUNITY): Payer: Self-pay

## 2021-01-23 MED FILL — Lorazepam Tab 0.5 MG: ORAL | 30 days supply | Qty: 90 | Fill #3 | Status: AC

## 2021-01-30 ENCOUNTER — Other Ambulatory Visit (HOSPITAL_COMMUNITY): Payer: Self-pay

## 2021-01-30 MED FILL — Atorvastatin Calcium Tab 80 MG (Base Equivalent): ORAL | 90 days supply | Qty: 90 | Fill #1 | Status: AC

## 2021-02-05 ENCOUNTER — Other Ambulatory Visit: Payer: Self-pay | Admitting: Family Medicine

## 2021-02-05 DIAGNOSIS — N401 Enlarged prostate with lower urinary tract symptoms: Secondary | ICD-10-CM

## 2021-02-06 ENCOUNTER — Other Ambulatory Visit (HOSPITAL_COMMUNITY): Payer: Self-pay

## 2021-02-06 MED ORDER — TAMSULOSIN HCL 0.4 MG PO CAPS
ORAL_CAPSULE | ORAL | 0 refills | Status: DC
  Filled 2021-02-06: qty 180, 90d supply, fill #0

## 2021-02-07 ENCOUNTER — Telehealth (HOSPITAL_COMMUNITY): Payer: Medicare HMO | Admitting: Psychiatry

## 2021-02-13 ENCOUNTER — Other Ambulatory Visit (HOSPITAL_COMMUNITY): Payer: Self-pay

## 2021-02-13 ENCOUNTER — Encounter (HOSPITAL_COMMUNITY): Payer: Self-pay | Admitting: Psychiatry

## 2021-02-13 ENCOUNTER — Telehealth (INDEPENDENT_AMBULATORY_CARE_PROVIDER_SITE_OTHER): Payer: Medicare HMO | Admitting: Psychiatry

## 2021-02-13 ENCOUNTER — Other Ambulatory Visit: Payer: Self-pay

## 2021-02-13 DIAGNOSIS — F3160 Bipolar disorder, current episode mixed, unspecified: Secondary | ICD-10-CM | POA: Diagnosis not present

## 2021-02-13 MED ORDER — QUETIAPINE FUMARATE 50 MG PO TABS
ORAL_TABLET | Freq: Every day | ORAL | 2 refills | Status: DC
  Filled 2021-02-13: qty 90, fill #0
  Filled 2021-03-05: qty 90, 90d supply, fill #0
  Filled 2021-05-31: qty 90, 90d supply, fill #1
  Filled 2021-08-29: qty 90, 90d supply, fill #2

## 2021-02-13 MED ORDER — PAROXETINE HCL 20 MG PO TABS
ORAL_TABLET | Freq: Every day | ORAL | 2 refills | Status: DC
  Filled 2021-02-13: qty 90, fill #0
  Filled 2021-04-09: qty 90, 90d supply, fill #0
  Filled 2021-07-04: qty 90, 90d supply, fill #1
  Filled 2021-10-03: qty 90, 90d supply, fill #2

## 2021-02-13 MED ORDER — TRAZODONE HCL 50 MG PO TABS
ORAL_TABLET | Freq: Every day | ORAL | 2 refills | Status: DC
  Filled 2021-02-13: qty 90, fill #0
  Filled 2021-04-23: qty 90, 90d supply, fill #0
  Filled 2021-07-18: qty 90, 90d supply, fill #1
  Filled 2021-10-03: qty 90, 90d supply, fill #2

## 2021-02-13 MED ORDER — DIVALPROEX SODIUM ER 500 MG PO TB24
ORAL_TABLET | Freq: Every day | ORAL | 2 refills | Status: DC
  Filled 2021-02-13: qty 90, fill #0
  Filled 2021-03-27: qty 90, 90d supply, fill #0
  Filled 2021-06-21: qty 90, 90d supply, fill #1
  Filled 2021-09-19: qty 90, 90d supply, fill #2

## 2021-02-13 MED ORDER — LORAZEPAM 0.5 MG PO TABS
ORAL_TABLET | Freq: Three times a day (TID) | ORAL | 3 refills | Status: DC
  Filled 2021-02-13: qty 90, fill #0
  Filled 2021-02-22: qty 90, 30d supply, fill #0
  Filled 2021-03-27: qty 90, 30d supply, fill #1
  Filled 2021-04-30: qty 90, 30d supply, fill #2
  Filled 2021-05-30: qty 90, 30d supply, fill #3

## 2021-02-13 MED ORDER — VENLAFAXINE HCL ER 75 MG PO CP24
75.0000 mg | ORAL_CAPSULE | Freq: Every day | ORAL | 2 refills | Status: DC
  Filled 2021-02-13 – 2021-05-01 (×2): qty 90, 90d supply, fill #0
  Filled 2021-07-31: qty 90, 90d supply, fill #1
  Filled 2021-11-03: qty 90, 90d supply, fill #2

## 2021-02-13 NOTE — Progress Notes (Signed)
Virtual Visit via Telephone Note  I connected with [name] on 02/13/21 at  1:20 PM EDT by telephone and verified that I am speaking with the correct person using two identifiers.  Location: Patient: home Provider: home office   I discussed the limitations, risks, security and privacy concerns of performing an evaluation and management service by telephone and the availability of in person appointments. I also discussed with the patient that there may be a patient responsible charge related to this service. The patient expressed understanding and agreed to proceed.      I discussed the assessment and treatment plan with the patient. The patient was provided an opportunity to ask questions and all were answered. The patient agreed with the plan and demonstrated an understanding of the instructions.   The patient was advised to call back or seek an in-person evaluation if the symptoms worsen or if the condition fails to improve as anticipated.  I provided 15 minutes of non-face-to-face time during this encounter.   Levonne Spiller, MD  [facility] MD/PA/NP OP Progress Note  02/13/2021 1:50 PM KHYRIE HEUTON  MRN:  [id number]  Chief Complaint:  Chief Complaint   Anxiety; Depression; Follow-up    HPI: This patient is a 65 year old married white male who lives with his wife and 3 children in [place]. 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   The patient presents with his wife today. He is very hard of hearing and it's difficult to get a good history from him but she filled in the gaps. She states that he's had depression for at least one year. He started worrying about his job being discontinued or that [name] might close about a year ago. He finally decided to retire early in November. He also had some medical problems going on such as loss of vision and loss of hearing. In 2012 he had colon resection for diverticulosis.   Since  retiring in November his mental status has declined. He started staying in bed all the time being unable to function not attending to his ADLs. He stopped eating and lost about 10 pounds. He was not interacting with anyone at all. His primary doctor had put him on Zoloft but it was not helpful. He had been on a low dose of Ativan for years as well. Finally in the end of May his wife brought him to [name] ED for evaluation and from there he was transferred to [facility] geropsychiatry unit.   While on the unit he was diagnosed with severe depression. He was started on a number of medicines including Paxil and Effexor, Seroquel trazodone and Depakote. He also had 4 treatments of ECT while in the hospital and 2 as an outpatient. His last one was in early June.   Since getting out of the hospital he seems to be doing somewhat better. He is getting out of bed every day and doing things around the house and yard. His family went on a trip to a [place] and he refused to go with them. States that he's had a lot of short-term memory loss since having ECT but his wife thinks his mood is much better. He's never had psychotic symptoms such as auditory or visual hallucinations or paranoia but his thoughts were somewhat disorganized. He has never been suicidal or homicidal. He does not drink or use drugs. His wife thinks that he slowly getting better but he  still not the person he used to be  The patient returns for follow-up after 6 months.  He states overall his mood has been good.  However he had a recent fall when he was carrying an air conditioning unit down some stairs.  He had a pretty large hematoma.  Is starting to resolve.  He states that he feels like he does not have the same splenic strength that he used to have and does not always feel steady on his feet.  He is going to address this with his primary care doctor.  I suggested that he have a Depakote level checked when he sees  her.  Overall however he states his mood is stable he denies serious depression suicidal ideation or any manic symptoms or racing thoughts or thoughts of self-harm.  He denies auditory visual hallucinations.   Visit Diagnosis:    ICD-10-CM   1. Bipolar I disorder, most recent episode mixed ([place])  F31.60       Past Psychiatric History: Hospitalization in 2015 as noted above  Past Medical History:  Past Medical History:  Diagnosis Date   Anxiety    Atherosclerosis of aorta (HCC)    Atrial contractions, premature    Depression    Dyslipidemia    Elevated glucose 11/11/2015   Hyperlipidemia    Mild hypertension    Urinary hesitancy     Past Surgical History:  Procedure Laterality Date   COLON RESECTION  2012    Family Psychiatric History: see below  Family History:  Family History  Problem Relation Age of Onset   Stroke Father    Bipolar disorder Father    Hypertension Sister    Depression Sister    COPD Mother    Colon cancer Neg Hx    Esophageal cancer Neg Hx    Inflammatory bowel disease Neg Hx    Liver disease Neg Hx    Rectal cancer Neg Hx    Pancreatic cancer Neg Hx    Stomach cancer Neg Hx     Social History:  Social History   Socioeconomic History   Marital status: Married    Spouse name: Not on file   Number of children: 3   Years of education: Not on file   Highest education level: Not on file  Occupational History   Occupation: retired  Tobacco Use   Smoking status: Former    Packs/day: 0.20    Years: 19.00    Pack years: 3.80    Types: Cigarettes   Smokeless tobacco: Never  Vaping Use   Vaping Use: Never used  Substance and Sexual Activity   Alcohol use: No    Alcohol/week: 0.0 standard drinks   Drug use: No   Sexual activity: Not Currently  Other Topics Concern   Not on file  Social History Narrative   Not on file   Social Determinants of Health   Financial Resource Strain: Not on file  Food Insecurity: Not on file   Transportation Needs: Not on file  Physical Activity: Not on file  Stress: Not on file  Social Connections: Not on file    Allergies: No Known Allergies  Metabolic Disorder Labs: Lab Results  Component Value Date   HGBA1C 5.9 04/01/2020   No results found for: PROLACTIN Lab Results  Component Value Date   CHOL 156 10/07/2020   TRIG 103 10/07/2020   HDL 45 10/07/2020   CHOLHDL 3.5 10/07/2020   [place] 92 10/07/2020   [place] 98 03/25/2019  Lab Results  Component Value Date   TSH 1.090 10/07/2019   TSH 1.300 08/26/2018    Therapeutic Level Labs: No results found for: LITHIUM Lab Results  Component Value Date   VALPROATE 41.8 (L) 11/29/2016   VALPROATE 44 (L) 11/11/2015   No components found for:  CBMZ  Current Medications: Current Outpatient Medications  Medication Sig Dispense Refill   amLODipine (NORVASC) 5 MG tablet TAKE 1 TABLET BY MOUTH ONCE DAILY 90 tablet 0   aspirin EC 81 MG tablet Take 1 tablet (81 mg total) by mouth daily. 90 tablet 3   atorvastatin (LIPITOR) 80 MG tablet TAKE 1 TABLET (80 MG TOTAL) BY MOUTH DAILY. 90 tablet 3   azithromycin (ZITHROMAX Z-PAK) 250 MG tablet Take 2 tablets (500 mg) by mouth today, then take 1 tablet (250 mg) by mouth daily for 4 days. 6 tablet 0   divalproex (DEPAKOTE ER) 500 MG 24 hr tablet TAKE 1 TABLET BY MOUTH AT BEDTIME. 90 tablet 2   LORazepam (ATIVAN) 0.5 MG tablet TAKE 1 TABLET BY MOUTH 3 TIMES DAILY. 90 tablet 3   methylPREDNISolone (MEDROL) 4 MG tablet Take 6 tablets on day 1, 5 tablets on day 2, 4 tablets on day 3, 3 tablets on day 4, 2 tablets on day 5 and 1 tablet on day 6. 21 tablet 0   PARoxetine (PAXIL) 20 MG tablet TAKE 1 TABLET BY MOUTH AT BEDTIME. 90 tablet 2   QUEtiapine (SEROQUEL) 50 MG tablet TAKE 1 TABLET BY MOUTH AT BEDTIME. 90 tablet 2   tamsulosin (FLOMAX) 0.4 MG CAPS capsule TAKE 1 CAPSULE BY MOUTH IN THE MORNING AND AT BEDTIME 180 capsule 0   traZODone (DESYREL) 50 MG tablet TAKE 1 TABLET BY  MOUTH AT BEDTIME. 90 tablet 2   venlafaxine XR (EFFEXOR-XR) 75 MG 24 hr capsule Take 1 capsule (75 mg total) by mouth daily with breakfast. 90 capsule 2   No current facility-administered medications for this visit.     Musculoskeletal: Strength & Muscle Tone: decreased Gait & Station: normal Patient leans: N/A  Psychiatric Specialty Exam: Review of Systems  Musculoskeletal:  Positive for back pain.  Neurological:  Positive for weakness.  All other systems reviewed and are negative.  There were no vitals taken for this visit.There is no height or weight on file to calculate BMI.  General Appearance: NA  Eye Contact:  NA  Speech:  Clear and Coherent  Volume:  Normal  Mood:  Euthymic  Affect:  NA  Thought Process:  Goal Directed  Orientation:  Full (Time, Place, and Person)  Thought Content: WDL   Suicidal Thoughts:  No  Homicidal Thoughts:  No  Memory:  Immediate;   Good Recent;   Good Remote;   NA  Judgement:  Good  Insight:  Fair  Psychomotor Activity:  Normal  Concentration:  Concentration: Good and Attention Span: Good  Recall:  Good  Fund of Knowledge: Good  Language: Good  Akathisia:  No  Handed:  Right  AIMS (if indicated): not done  Assets:  Communication Skills Desire for Improvement Resilience Social Support Talents/Skills  ADL's:  Intact  Cognition: WNL  Sleep:  Good   Screenings: GAD-7    Flowsheet Row Office Visit from 12/16/2020 in [place] Office Visit from 08/26/2018 in [place] Visit from 05/26/2018 in [place]  Total GAD-7 Score 0 0 3      PHQ2-9    Flowsheet Row Video Visit from  02/13/2021 in [place] Office Visit from 12/16/2020 in [place] Office Visit from 10/04/2020 in [place] Visit from 08/17/2020 in [place] Visit from  04/01/2020 in [place]  PHQ-2 Total Score 0 0 0 0 0  PHQ-9 Total Score -- 0 -- 0 --      Flowsheet Row Video Visit from 02/13/2021 in [place] No Risk        Assessment and Plan: This patient is a 65 year old male with a history of severe depression requiring ECT in the past.  He continues to do well on his current regimen.  He will continue lorazepam 0.5 mg 3 times daily for anxiety, Paxil 20 mg nightly for depression, Seroquel 50 mg nightly for mood stabilization trazodone 50 mg nightly for sleep, Effexor Exar 75 mg daily for depression and Depakote DR 500 mg daily at bedtime for mood stabilization.  He will return to see me in 6 months.  He is going to address his leg weakness up with his primary care physician   Levonne Spiller, MD 02/13/2021, 1:50 PM

## 2021-02-16 ENCOUNTER — Encounter: Payer: Self-pay | Admitting: *Deleted

## 2021-02-22 ENCOUNTER — Other Ambulatory Visit (HOSPITAL_COMMUNITY): Payer: Self-pay

## 2021-03-06 ENCOUNTER — Other Ambulatory Visit (HOSPITAL_COMMUNITY): Payer: Self-pay

## 2021-03-07 ENCOUNTER — Other Ambulatory Visit (HOSPITAL_COMMUNITY): Payer: Self-pay

## 2021-03-10 ENCOUNTER — Other Ambulatory Visit (HOSPITAL_COMMUNITY): Payer: Self-pay

## 2021-03-10 ENCOUNTER — Ambulatory Visit (INDEPENDENT_AMBULATORY_CARE_PROVIDER_SITE_OTHER): Payer: Medicare HMO | Admitting: Family Medicine

## 2021-03-10 ENCOUNTER — Encounter: Payer: Self-pay | Admitting: Family Medicine

## 2021-03-10 ENCOUNTER — Other Ambulatory Visit: Payer: Self-pay

## 2021-03-10 VITALS — BP 149/94 | HR 83 | Temp 97.2°F | Ht 70.0 in | Wt 257.0 lb

## 2021-03-10 DIAGNOSIS — M545 Low back pain, unspecified: Secondary | ICD-10-CM

## 2021-03-10 DIAGNOSIS — R2689 Other abnormalities of gait and mobility: Secondary | ICD-10-CM | POA: Diagnosis not present

## 2021-03-10 DIAGNOSIS — Z8673 Personal history of transient ischemic attack (TIA), and cerebral infarction without residual deficits: Secondary | ICD-10-CM

## 2021-03-10 DIAGNOSIS — I10 Essential (primary) hypertension: Secondary | ICD-10-CM

## 2021-03-10 LAB — BAYER DCA HB A1C WAIVED: HB A1C (BAYER DCA - WAIVED): 5.9 % — ABNORMAL HIGH (ref 4.8–5.6)

## 2021-03-10 MED ORDER — AMLODIPINE BESYLATE 10 MG PO TABS
10.0000 mg | ORAL_TABLET | Freq: Every day | ORAL | 3 refills | Status: DC
  Filled 2021-03-10: qty 90, 90d supply, fill #0
  Filled 2021-07-04: qty 90, 90d supply, fill #1
  Filled 2021-10-03: qty 90, 90d supply, fill #2
  Filled 2022-01-08: qty 90, 90d supply, fill #3

## 2021-03-10 NOTE — Patient Instructions (Addendum)
Increase amlodipine to 10 mg daily. Monitor blood pressures daily.  Goal blood pressure should be less than 150/90.  Today you had about a 10 point discrepancy between our monitors and your home monitor.  I would prefer you to have manual blood pressure checks at home. His neurologic exam was normal with the exception of chronic hearing problems.  However, given his history of stroke MRI has been ordered Given his balance has been worse over the last few weeks, I am going to order MRI outpatient rather than sending you to the emergency department for work-up. Referral to physical therapy [place] has been placed.  I suspect that back pain and balance issues are related to central obesity Labs have been placed.  They should return over the weekend and we will contact you on Monday with results.  You just had a cholesterol panel done in April so this was not repeated.

## 2021-03-10 NOTE — Progress Notes (Signed)
Subjective: CC:HTN PCP: [name], DO Lawrence:EMVVKP H Fish is a 65 y.o. Bennett presenting to clinic today for:  1. HTN Patient is accompanied today by his wife who notes that there family member has been measuring his blood pressures at home after they found that he was having diastolic blood pressures over 115 on their automatic machine.  She was still getting elevated blood pressures up to systolics of 224S over 975'P manually.  He has had some worsening of his balance over the last several weeks and this brought concern for possible recurrent stroke.  He apparently has history of occipital stroke many years ago.  He admits to feeling like he is more top-heavy and could fall over face forward at any moment.  However, he does not report any specific limb weakness, sensory changes, dizziness etc.  He has chronic poor hearing.  He is compliant with amlodipine 5 mg daily, Lipitor 80 mg daily, daily aspirin.  He is treated with Effexor, Seroquel and Paxil by his psychiatrist.  He is also on trazodone and Ativan as needed.  She reports that he really does not do much physical activity at home.  He often sits a lot and eats at least 4 ice cream's per day in addition to his normal oral intake.  She worries about him having an undiagnosed diabetes and asked that we do the full metabolic panel for him today  Patient denies any chest pain, shortness of breath, heart palpitations.  He reports increased low back pain as of late.   ROS: Per HPI  No Known Allergies Past Medical History:  Diagnosis Date   Anxiety    Atherosclerosis of aorta (HCC)    Atrial contractions, premature    Depression    Dyslipidemia    Elevated glucose 11/11/2015   Hyperlipidemia    Mild hypertension    Urinary hesitancy     Current Outpatient Medications:    amLODipine (NORVASC) 5 MG tablet, TAKE 1 TABLET BY MOUTH ONCE DAILY, Disp: 90 tablet, Rfl: 0   aspirin EC 81 MG tablet, Take 1 tablet (81 mg total) by mouth  daily., Disp: 90 tablet, Rfl: 3   atorvastatin (LIPITOR) 80 MG tablet, TAKE 1 TABLET (80 MG TOTAL) BY MOUTH DAILY., Disp: 90 tablet, Rfl: 3   divalproex (DEPAKOTE ER) 500 MG 24 hr tablet, TAKE 1 TABLET BY MOUTH AT BEDTIME., Disp: 90 tablet, Rfl: 2   LORazepam (ATIVAN) 0.5 MG tablet, TAKE 1 TABLET BY MOUTH 3 TIMES DAILY., Disp: 90 tablet, Rfl: 3   PARoxetine (PAXIL) 20 MG tablet, TAKE 1 TABLET BY MOUTH AT BEDTIME., Disp: 90 tablet, Rfl: 2   QUEtiapine (SEROQUEL) 50 MG tablet, TAKE 1 TABLET BY MOUTH AT BEDTIME., Disp: 90 tablet, Rfl: 2   tamsulosin (FLOMAX) 0.4 MG CAPS capsule, TAKE 1 CAPSULE BY MOUTH IN THE MORNING AND AT BEDTIME, Disp: 180 capsule, Rfl: 0   traZODone (DESYREL) 50 MG tablet, TAKE 1 TABLET BY MOUTH AT BEDTIME., Disp: 90 tablet, Rfl: 2   venlafaxine XR (EFFEXOR-XR) 75 MG 24 hr capsule, Take 1 capsule (75 mg total) by mouth daily with breakfast., Disp: 90 capsule, Rfl: 2 Social History   Socioeconomic History   Marital status: Married    Spouse name: Not on file   Number of children: 3   Years of education: Not on file   Highest education level: Not on file  Occupational History   Occupation: retired  Tobacco Use   Smoking status: Former  Packs/day: 0.20    Years: 19.00    Pack years: 3.80    Types: Cigarettes   Smokeless tobacco: Never  Vaping Use   Vaping Use: Never used  Substance and Sexual Activity   Alcohol use: No    Alcohol/week: 0.0 standard drinks   Drug use: No   Sexual activity: Not Currently  Other Topics Concern   Not on file  Social History Narrative   Not on file   Social Determinants of Health   Financial Resource Strain: Not on file  Food Insecurity: Not on file  Transportation Needs: Not on file  Physical Activity: Not on file  Stress: Not on file  Social Connections: Not on file  Intimate Partner Violence: Not on file   Family History  Problem Relation Age of Onset   Stroke Father    Bipolar disorder Father    Hypertension Sister     Depression Sister    COPD Mother    Colon cancer Neg Hx    Esophageal cancer Neg Hx    Inflammatory bowel disease Neg Hx    Liver disease Neg Hx    Rectal cancer Neg Hx    Pancreatic cancer Neg Hx    Stomach cancer Neg Hx     Objective: Office vital signs reviewed. BP (!) 149/94   Pulse 83   Temp (!) 97.2 F (36.2 C)   Ht 5' 10" (1.778 m)   Wt 257 lb (116.6 kg)   SpO2 94%   BMI 36.88 kg/m   Physical Examination:  General: Awake, alert, nontoxic, No acute distress HEENT: Normal; PERRLA, EOMI, symmetric rise of palate.  Oropharynx patent and moist. Cardio: regular rate and rhythm, S1S2 heard, no murmurs appreciated Pulm: clear to auscultation bilaterally, no wheezes, rhonchi or rales; normal work of breathing on room air MSK: Slow, stiff gait and mildly hunched station Skin: dry; intact; no rashes or lesions Neuro: Upper extremity and lower extremity strength 5/5 and light touch sensation grossly intact, cranial nerves II through XII grossly intact with exception of his hearing which is markedly decreased and stable.  Upper extremity and lower extremity cerebellar testing normal.  Normal Romberg.  Assessment/ Plan: 65 y.o. Bennett   Accelerated hypertension - Plan: MR Brain Wo Contrast, CMP14+EGFR, Magnesium  Essential hypertension - Plan: amLODipine (NORVASC) 10 MG tablet, CMP14+EGFR, Magnesium  Balance problems - Plan: MR Brain Wo Contrast, CMP14+EGFR, Magnesium, Vitamin B12, TSH, Ambulatory referral to Physical Therapy  History of stroke - Plan: MR Brain Wo Contrast, Bayer DCA Hb A1c Waived, CMP14+EGFR, Magnesium, Vitamin B12, TSH  Morbid obesity (HCC) - Plan: Bayer DCA Hb A1c Waived, TSH  Acute bilateral low back pain without sciatica - Plan: Ambulatory referral to Physical Therapy  Blood pressure is not at goal and sounds like its been very uncontrolled at home.  I am advancing his amlodipine to 10 mg.  They are to monitor his blood pressures closely.  Since his  symptoms of balance issues have been ongoing for well over 2 weeks and nonemergent MRI has been ordered.  Whilst his neurologic exam was fairly unremarkable because he has history of stroke in the past I have proceeded with MRI.  I have also placed referral to physical therapy, reinforced need for improved diet, improved exercise.  I do wonder if some of his balance is a secondary to physical deconditioning, abdominal obesity.  Back pain with certainly increased due to abdominal obesity.  We discussed that weight loss is required  No orders  of the defined types were placed in this encounter.  No orders of the defined types were placed in this encounter.    [name], DO [place] [phone]

## 2021-03-11 LAB — CMP14+EGFR
ALT: 19 IU/L (ref 0–44)
AST: 17 IU/L (ref 0–40)
Albumin/Globulin Ratio: 1.7 (ref 1.2–2.2)
Albumin: 4.1 g/dL (ref 3.8–4.8)
Alkaline Phosphatase: 107 IU/L (ref 44–121)
BUN/Creatinine Ratio: 12 (ref 10–24)
BUN: 12 mg/dL (ref 8–27)
Bilirubin Total: 0.4 mg/dL (ref 0.0–1.2)
CO2: 21 mmol/L (ref 20–29)
Calcium: 9.3 mg/dL (ref 8.6–10.2)
Chloride: 99 mmol/L (ref 96–106)
Creatinine, Ser: 0.98 mg/dL (ref 0.76–1.27)
Globulin, Total: 2.4 g/dL (ref 1.5–4.5)
Glucose: 94 mg/dL (ref 65–99)
Potassium: 4.4 mmol/L (ref 3.5–5.2)
Sodium: 136 mmol/L (ref 134–144)
Total Protein: 6.5 g/dL (ref 6.0–8.5)
eGFR: 85 mL/min/{1.73_m2} (ref >59)

## 2021-03-11 LAB — TSH: TSH: 4.42 u[IU]/mL (ref 0.450–4.500)

## 2021-03-11 LAB — MAGNESIUM: Magnesium: 2 mg/dL (ref 1.6–2.3)

## 2021-03-11 LAB — VITAMIN B12: Vitamin B-12: 446 pg/mL (ref 232–1245)

## 2021-03-16 ENCOUNTER — Encounter: Payer: Self-pay | Admitting: *Deleted

## 2021-03-17 ENCOUNTER — Ambulatory Visit
Admission: RE | Admit: 2021-03-17 | Discharge: 2021-03-17 | Disposition: A | Discharge status: Home / Self Care | Payer: Medicare HMO | Source: Ambulatory Visit | Attending: Family Medicine | Admitting: Family Medicine

## 2021-03-17 ENCOUNTER — Other Ambulatory Visit: Payer: Self-pay

## 2021-03-17 DIAGNOSIS — I6381 Other cerebral infarction due to occlusion or stenosis of small artery: Secondary | ICD-10-CM | POA: Diagnosis not present

## 2021-03-17 DIAGNOSIS — R2689 Other abnormalities of gait and mobility: Secondary | ICD-10-CM

## 2021-03-17 DIAGNOSIS — I10 Essential (primary) hypertension: Secondary | ICD-10-CM

## 2021-03-17 DIAGNOSIS — Z8673 Personal history of transient ischemic attack (TIA), and cerebral infarction without residual deficits: Secondary | ICD-10-CM

## 2021-03-19 ENCOUNTER — Telehealth: Payer: Self-pay | Admitting: Family Medicine

## 2021-03-19 NOTE — Telephone Encounter (Signed)
Call center called on 03/17/2021 at 6:21 PM with MRI results of 3 mm acute/early subacute lacunar infarct within the left basal ganglia. Patient's brain MRI was ordered nonemergent since his issues have been going on for >2 weeks. His blood pressure had been uncontrolled, so his medication dosage was increased. He is on atorvastatin as well. He was also referred to physical therapy. I did call patient and his wife to make sure he wasn't having any new or worsening symptoms and he does not. Advised of this result and that his PCP would in touch next week.

## 2021-03-20 ENCOUNTER — Other Ambulatory Visit: Payer: Self-pay | Admitting: Family Medicine

## 2021-03-20 DIAGNOSIS — Z8673 Personal history of transient ischemic attack (TIA), and cerebral infarction without residual deficits: Secondary | ICD-10-CM

## 2021-03-20 DIAGNOSIS — I6930 Unspecified sequelae of cerebral infarction: Secondary | ICD-10-CM

## 2021-03-20 NOTE — Progress Notes (Signed)
Patient called back.  I reviewed the MRI results and informed him that I am placing a new referral for neurology and cardiac evaluation.  Since he is several weeks now from suspected stroke, I think this can be done on an outpatient setting.  We discussed control of blood pressure, he does state that the blood pressure is coming down with the increased dose of Norvasc.  He is to continue cholesterol medication at current dose.  He is on high-dose statin already.  Orders Placed This Encounter  Procedures   Ambulatory referral to Neurology    Referral Priority:   Urgent    Referral Type:   Consultation    Referral Reason:   Specialty Services Required    Requested Specialty:   Neurology    Number of Visits Requested:   1   Ambulatory referral to Cardiology    Referral Priority:   Routine    Referral Type:   Consultation    Referral Reason:   Specialty Services Required    Requested Specialty:   Cardiology    Number of Visits Requested:   1

## 2021-03-20 NOTE — Telephone Encounter (Signed)
Tried calling on both cell number and home number.  No answer.  The MRI shows a new stroke.  We might have to move PT to [facility] if [facility] cannot treat him for this.  Will cc [facility].  I have also placed a referral to neuro.  If he has a preference for neurology group, please let us know.

## 2021-03-21 ENCOUNTER — Encounter: Payer: Self-pay | Admitting: Neurology

## 2021-03-21 ENCOUNTER — Other Ambulatory Visit (HOSPITAL_COMMUNITY): Payer: Self-pay

## 2021-03-21 ENCOUNTER — Ambulatory Visit: Payer: Medicare HMO | Admitting: Neurology

## 2021-03-21 ENCOUNTER — Other Ambulatory Visit: Payer: Self-pay

## 2021-03-21 VITALS — BP 116/76 | HR 100 | Ht 70.0 in | Wt 255.6 lb

## 2021-03-21 DIAGNOSIS — R2681 Unsteadiness on feet: Secondary | ICD-10-CM

## 2021-03-21 DIAGNOSIS — M545 Low back pain, unspecified: Secondary | ICD-10-CM | POA: Diagnosis not present

## 2021-03-21 DIAGNOSIS — I6381 Other cerebral infarction due to occlusion or stenosis of small artery: Secondary | ICD-10-CM

## 2021-03-21 MED ORDER — CLOPIDOGREL BISULFATE 75 MG PO TABS
75.0000 mg | ORAL_TABLET | Freq: Every day | ORAL | 11 refills | Status: DC
  Filled 2021-03-21: qty 30, 30d supply, fill #0
  Filled 2021-04-09: qty 30, 30d supply, fill #1
  Filled 2021-05-08: qty 30, 30d supply, fill #2
  Filled 2021-06-15: qty 30, 30d supply, fill #3
  Filled 2021-07-11: qty 30, 30d supply, fill #4
  Filled 2021-08-15: qty 30, 30d supply, fill #5
  Filled 2021-09-19: qty 30, 30d supply, fill #6

## 2021-03-21 NOTE — Progress Notes (Addendum)
NEUROLOGY CONSULTATION NOTE  [name] MRN: [id number] DOB: 1955-11-09  Referring provider: Dr. Ronnie Doss Primary care provider: Dr. Ronnie Doss  Reason for consult:  stroke  Dear Dr Lajuana Ripple:  Thank you for your kind referral of Lawrence Bennett for consultation of the above symptoms. Although his history is well known to you, please allow me to reiterate it for the purpose of our medical record. The patient was accompanied to the clinic by his wife Juliann Pulse who also provides collateral information. Records and images were personally reviewed where available.   HISTORY OF PRESENT ILLNESS: This is a pleasant 65 year old right-handed man with a history of hypertension, hyperlipidemia, prior lacunar stroke with no residual deficits, hearing loss, anxiety, depression, presenting for evaluation of stroke. He was seen by his PCP on 03/10/21 for hypertension with BPs in the 180s/115. He had been having worsening balance for several weeks and family was concerned about stroke. He denied any focal weakness, sensory changes. I personally reviewed MRI brain without contrast done 03/17/21 which showed a tiny 17mm focus of restricted diffusion in the left lentiform nucleus consistent with an acute/early subacute lacunar infarct. There were chronic lacunar infarcts in the right thalamus, multiple small vessel infarcts in the left corona radiata and left basal ganglia. There was moderate chronic microvascular disease and diffuse atrophy. He has been taking a daily aspirin and statin, amlodipine dose increased. BP today 116/76.  He and his wife report that the balance changes just came on quick, he did not use to stumble all over the place or walk slow. 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 He feels balance changes started around 2 months ago. He denies any focal numbness/tingling/weakness. No speech or swallowing  difficulties. He has rare headaches, no dizziness, diplopia, neck pain. He has back pain and urinary urgency. He gets out of breath walking short distances, his wife reports he does not exercise. He has been referred to Cardiology as well.  He had an MRI brain in 2015 because while he was at his doctor's office, he "felt someone is missing something," then his MRI showed chronic lacunar infarcts (no acute changes) and he was told he had a stroke in the past which he was unaware of. He reports his memory is "not that great," but he denies missing medications. He has been taking his aspirin daily. He denies missing bill payments or getting lost driving, but he would not remember where he went the day prior. He reports a history of mental breakdown in 2015 where he had 6 ECT treatments. Mood currently is good. He is sleeping well. There is a history of stroke in his father and paternal aunt.    Laboratory Data: Lab Results  Component Value Date   HGBA1C 5.9 (H) 03/10/2021   Lab Results  Component Value Date   CHOL 156 10/07/2020   HDL 45 10/07/2020   LDLCALC 92 10/07/2020   TRIG 103 10/07/2020   CHOLHDL 3.5 10/07/2020   Lab Results  Component Value Date   TSH 4.420 03/10/2021    Lab Results  Component Value Date   VITAMINB12 446 03/10/2021     PAST MEDICAL HISTORY: Past Medical History:  Diagnosis Date   Anxiety    Atherosclerosis of aorta (HCC)    Atrial contractions, premature    Depression    Dyslipidemia    Elevated glucose 11/11/2015   Hyperlipidemia  Mild hypertension    Urinary hesitancy     PAST SURGICAL HISTORY: Past Surgical History:  Procedure Laterality Date   COLON RESECTION  2012    MEDICATIONS: Current Outpatient Medications on File Prior to Visit  Medication Sig Dispense Refill   amLODipine (NORVASC) 10 MG tablet Take 1 tablet (10 mg total) by mouth daily. 90 tablet 3   aspirin EC 81 MG tablet Take 1 tablet (81 mg total) by mouth daily. 90 tablet 3    atorvastatin (LIPITOR) 80 MG tablet TAKE 1 TABLET (80 MG TOTAL) BY MOUTH DAILY. 90 tablet 3   divalproex (DEPAKOTE ER) 500 MG 24 hr tablet TAKE 1 TABLET BY MOUTH AT BEDTIME. 90 tablet 2   LORazepam (ATIVAN) 0.5 MG tablet TAKE 1 TABLET BY MOUTH 3 TIMES DAILY. 90 tablet 3   PARoxetine (PAXIL) 20 MG tablet TAKE 1 TABLET BY MOUTH AT BEDTIME. 90 tablet 2   QUEtiapine (SEROQUEL) 50 MG tablet TAKE 1 TABLET BY MOUTH AT BEDTIME. 90 tablet 2   tamsulosin (FLOMAX) 0.4 MG CAPS capsule TAKE 1 CAPSULE BY MOUTH IN THE MORNING AND AT BEDTIME 180 capsule 0   traZODone (DESYREL) 50 MG tablet TAKE 1 TABLET BY MOUTH AT BEDTIME. 90 tablet 2   venlafaxine XR (EFFEXOR-XR) 75 MG 24 hr capsule Take 1 capsule (75 mg total) by mouth daily with breakfast. 90 capsule 2   No current facility-administered medications on file prior to visit.    ALLERGIES: No Known Allergies  FAMILY HISTORY: Family History  Problem Relation Age of Onset   Stroke Father    Bipolar disorder Father    Hypertension Sister    Depression Sister    COPD Mother    Colon cancer Neg Hx    Esophageal cancer Neg Hx    Inflammatory bowel disease Neg Hx    Liver disease Neg Hx    Rectal cancer Neg Hx    Pancreatic cancer Neg Hx    Stomach cancer Neg Hx     SOCIAL HISTORY: Social History   Socioeconomic History   Marital status: Married    Spouse name: Not on file   Number of children: 3   Years of education: Not on file   Highest education level: Not on file  Occupational History   Occupation: retired  Tobacco Use   Smoking status: Former    Packs/day: 0.20    Years: 19.00    Pack years: 3.80    Types: Cigarettes   Smokeless tobacco: Never  Vaping Use   Vaping Use: Never used  Substance and Sexual Activity   Alcohol use: No    Alcohol/week: 0.0 standard drinks   Drug use: No   Sexual activity: Not Currently  Other Topics Concern   Not on file  Social History Narrative   Not on file   Social Determinants of Health    Financial Resource Strain: Not on file  Food Insecurity: Not on file  Transportation Needs: Not on file  Physical Activity: Not on file  Stress: Not on file  Social Connections: Not on file  Intimate Partner Violence: Not on file     PHYSICAL EXAM: Vitals:   03/21/21 1355  BP: 116/76  Pulse: 100  SpO2: 94%   General: No acute distress Head:  Normocephalic/atraumatic Skin/Extremities: No rash, no edema Neurological Exam: Mental status: alert and oriented to person, place, and time, no dysarthria or aphasia, Fund of knowledge is appropriate.  Recent and remote memory are intact, 3/3 delayed recall.  Attention and concentration are normal.   Cranial nerves: CN I: not tested CN II: pupils equal, round and reactive to light, visual fields intact CN III, IV, VI:  full range of motion, no nystagmus, no ptosis CN V: facial sensation intact CN VII: upper and lower face symmetric CN VIII: hearing intact to conversation CN IX, X: gag intact, uvula midline CN XI: sternocleidomastoid and trapezius muscles intact CN XII: tongue midline Bulk & Tone: normal, no fasciculations. Motor: 5/5 throughout with no pronator drift. Sensation: intact to light touch, cold, pin, vibration sense.  No extinction to double simultaneous stimulation.  Romberg test negative Deep Tendon Reflexes: +1 throughout Cerebellar: no incoordination on finger to nose testing Gait: slightly wide-based, no ataxia Tremor: none   IMPRESSION: This is a pleasant 65 year old right-handed man with a history of hypertension, hyperlipidemia, prior lacunar stroke with no residual deficits, hearing loss, anxiety, depression, presenting for evaluation of stroke found on brain MRI done 03/17/2021. Brain MRI was done due to report of balance changes that started around 2 months ago. I reviewed brain MRI with patient and his wife, there is a punctate stroke in the left lentiform nucleus which would not cause his symptoms. His exam  today is normal. Stroke workup with MRA head and neck will be ordered to assess for intracranial atherosclerosis or flow-limiting stenosis. He will be seeing Cardiology and will likely have an echocardiogram. We discussed switching from aspirin to Plavix 75mg  daily for secondary stroke prevention, as well as control of vascular risk factors. BP today normal. We discussed that gait/balance changes can be seen with cerebral small vessel disease, but his back pain and knee pain may be contributing as well. Proceed with physical therapy for balance and back pain. He knows to go to the ER for any sudden change in symptoms. Follow-up in 6 months, call for any changes.   Thank you for allowing me to participate in the care of this patient. Please do not hesitate to call for any questions or concerns.   Ellouise Newer, M.D.  CC: Dr. Lajuana Ripple

## 2021-03-21 NOTE — Patient Instructions (Signed)
Schedule MRA head without contrast, MRA neck with and without contrast  2. Start Plavix 75mg  daily. Stop aspirin  3. Proceed with Cardiology visit, would recommend echocardiogram  4. Referral will be sent for physical therapy for balance and back pain  5. Continue with control of sugar level, blood pressure, cholesterol  6. For any sudden change in symptoms, go to ER immediately  7. Follow-up in 6 months, call for any changes

## 2021-03-27 ENCOUNTER — Other Ambulatory Visit (HOSPITAL_COMMUNITY): Payer: Self-pay

## 2021-04-03 ENCOUNTER — Ambulatory Visit (INDEPENDENT_AMBULATORY_CARE_PROVIDER_SITE_OTHER): Payer: Medicare HMO

## 2021-04-03 VITALS — Ht 70.0 in | Wt 240.0 lb

## 2021-04-03 DIAGNOSIS — Z1211 Encounter for screening for malignant neoplasm of colon: Secondary | ICD-10-CM | POA: Diagnosis not present

## 2021-04-03 DIAGNOSIS — Z0000 Encounter for general adult medical examination without abnormal findings: Secondary | ICD-10-CM | POA: Diagnosis not present

## 2021-04-03 DIAGNOSIS — H903 Sensorineural hearing loss, bilateral: Secondary | ICD-10-CM | POA: Insufficient documentation

## 2021-04-03 NOTE — Patient Instructions (Signed)
[name] , Thank you for taking time to come for your Medicare Wellness Visit. I appreciate your ongoing commitment to your health goals. Please review the following plan we discussed and let me know if I can assist you in the future.   Screening recommendations/referrals: Colonoscopy: Done 04/04/2010 - declines repeat - Cologuard ordered - will come in the mail in the next couple of weeks Recommended yearly ophthalmology/optometry visit for glaucoma screening and checkup Recommended yearly dental visit for hygiene and checkup  Vaccinations: Influenza vaccine: Done 06/10/2020 - Repeat annually *due now Pneumococcal vaccine: Done 05/26/2018 & 10/07/2019 Tdap vaccine: Done 04/23/2018 - Repeat in 10 years Shingles vaccine: Shingrix discussed. Please contact your pharmacy for coverage information.     Covid-19: Done 11/12/2019, 12/03/2019, & 06/20/2020  Advanced directives: Please bring a copy of your health care power of attorney and living will to the office to be added to your chart at your convenience.   Conditions/risks identified: Aim for 30 minutes of exercise or brisk walking each day, drink 6-8 glasses of water and eat lots of fruits and vegetables.   Next appointment: Follow up in one year for your annual wellness visit.   Preventive Care 66 Years and Older, Male  Preventive care refers to lifestyle choices and visits with your health care provider that can promote health and wellness. What does preventive care include? A yearly physical exam. This is also called an annual well check. Dental exams once or twice a year. Routine eye exams. Ask your health care provider how often you should have your eyes checked. Personal lifestyle choices, including: Daily care of your teeth and gums. Regular physical activity. Eating a healthy diet. Avoiding tobacco and drug use. Limiting alcohol use. Practicing safe sex. Taking low doses of aspirin every day. Taking vitamin and mineral  supplements as recommended by your health care provider. What happens during an annual well check? The services and screenings done by your health care provider during your annual well check will depend on your age, overall health, lifestyle risk factors, and family history of disease. Counseling  Your health care provider may ask you questions about your: Alcohol use. Tobacco use. Drug use. Emotional well-being. Home and relationship well-being. Sexual activity. Eating habits. History of falls. Memory and ability to understand (cognition). Work and work Statistician. Screening  You may have the following tests or measurements: Height, weight, and BMI. Blood pressure. Lipid and cholesterol levels. These may be checked every 5 years, or more frequently if you are over 85 years old. Skin check. Lung cancer screening. You may have this screening every year starting at age 66 if you have a 30-pack-year history of smoking and currently smoke or have quit within the past 15 years. Fecal occult blood test (FOBT) of the stool. You may have this test every year starting at age 66. Flexible sigmoidoscopy or colonoscopy. You may have a sigmoidoscopy every 5 years or a colonoscopy every 10 years starting at age 66. Prostate cancer screening. Recommendations will vary depending on your family history and other risks. Hepatitis C blood test. Hepatitis B blood test. Sexually transmitted disease (STD) testing. Diabetes screening. This is done by checking your blood sugar (glucose) after you have not eaten for a while (fasting). You may have this done every 1-3 years. Abdominal aortic aneurysm (AAA) screening. You may need this if you are a current or former smoker. Osteoporosis. You may be screened starting at age 66 if you are at high risk. Talk with  your health care provider about your test results, treatment options, and if necessary, the need for more tests. Vaccines  Your health care provider  may recommend certain vaccines, such as: Influenza vaccine. This is recommended every year. Tetanus, diphtheria, and acellular pertussis (Tdap, Td) vaccine. You may need a Td booster every 10 years. Zoster vaccine. You may need this after age 66. Pneumococcal 13-valent conjugate (PCV13) vaccine. One dose is recommended after age 66. Pneumococcal polysaccharide (PPSV23) vaccine. One dose is recommended after age 66. Talk to your health care provider about which screenings and vaccines you need and how often you need them. This information is not intended to replace advice given to you by your health care provider. Make sure you discuss any questions you have with your health care provider. Document Released: 07/15/2015 Document Revised: 03/07/2016 Document Reviewed: 04/19/2015 Elsevier Interactive Patient Education  2017 [place] Prevention in the Home Falls can cause injuries. They can happen to people of all ages. There are many things you can do to make your home safe and to help prevent falls. What can I do on the outside of my home? Regularly fix the edges of walkways and driveways and fix any cracks. Remove anything that might make you trip as you walk through a door, such as a raised step or threshold. Trim any bushes or trees on the path to your home. Use bright outdoor lighting. Clear any walking paths of anything that might make someone trip, such as rocks or tools. Regularly check to see if handrails are loose or broken. Make sure that both sides of any steps have handrails. Any raised decks and porches should have guardrails on the edges. Have any leaves, snow, or ice cleared regularly. Use sand or salt on walking paths during winter. Clean up any spills in your garage right away. This includes oil or grease spills. What can I do in the bathroom? Use night lights. Install grab bars by the toilet and in the tub and shower. Do not use towel bars as grab bars. Use  non-skid mats or decals in the tub or shower. If you need to sit down in the shower, use a plastic, non-slip stool. Keep the floor dry. Clean up any water that spills on the floor as soon as it happens. Remove soap buildup in the tub or shower regularly. Attach bath mats securely with double-sided non-slip rug tape. Do not have throw rugs and other things on the floor that can make you trip. What can I do in the bedroom? Use night lights. Make sure that you have a light by your bed that is easy to reach. Do not use any sheets or blankets that are too big for your bed. They should not hang down onto the floor. Have a firm chair that has side arms. You can use this for support while you get dressed. Do not have throw rugs and other things on the floor that can make you trip. What can I do in the kitchen? Clean up any spills right away. Avoid walking on wet floors. Keep items that you use a lot in easy-to-reach places. If you need to reach something above you, use a strong step stool that has a grab bar. Keep electrical cords out of the way. Do not use floor polish or wax that makes floors slippery. If you must use wax, use non-skid floor wax. Do not have throw rugs and other things on the floor that can make you trip.  What can I do with my stairs? Do not leave any items on the stairs. Make sure that there are handrails on both sides of the stairs and use them. Fix handrails that are broken or loose. Make sure that handrails are as long as the stairways. Check any carpeting to make sure that it is firmly attached to the stairs. Fix any carpet that is loose or worn. Avoid having throw rugs at the top or bottom of the stairs. If you do have throw rugs, attach them to the floor with carpet tape. Make sure that you have a light switch at the top of the stairs and the bottom of the stairs. If you do not have them, ask someone to add them for you. What else can I do to help prevent falls? Wear  shoes that: Do not have high heels. Have rubber bottoms. Are comfortable and fit you well. Are closed at the toe. Do not wear sandals. If you use a stepladder: Make sure that it is fully opened. Do not climb a closed stepladder. Make sure that both sides of the stepladder are locked into place. Ask someone to hold it for you, if possible. Clearly mark and make sure that you can see: Any grab bars or handrails. First and last steps. Where the edge of each step is. Use tools that help you move around (mobility aids) if they are needed. These include: Canes. Walkers. Scooters. Crutches. Turn on the lights when you go into a dark area. Replace any light bulbs as soon as they burn out. Set up your furniture so you have a clear path. Avoid moving your furniture around. If any of your floors are uneven, fix them. If there are any pets around you, be aware of where they are. Review your medicines with your doctor. Some medicines can make you feel dizzy. This can increase your chance of falling. Ask your doctor what other things that you can do to help prevent falls. This information is not intended to replace advice given to you by your health care provider. Make sure you discuss any questions you have with your health care provider. Document Released: 04/14/2009 Document Revised: 11/24/2015 Document Reviewed: 07/23/2014 Elsevier Interactive Patient Education  2017 [employer].

## 2021-04-03 NOTE — Progress Notes (Signed)
Subjective:   Lawrence Bennett is a 65 y.o. male who presents for an Initial Medicare Annual Wellness Visit.  Virtual Visit via Telephone Note  I connected with  Lawrence Bennett on 04/03/21 at  1:15 PM EDT by telephone and verified that I am speaking with the correct person using two identifiers.  Location: Patient: Home Provider: [facility] Persons participating in the virtual visit: patient/Nurse Health Advisor   I discussed the limitations, risks, security and privacy concerns of performing an evaluation and management service by telephone and the availability of in person appointments. The patient expressed understanding and agreed to proceed.  Interactive audio and video telecommunications were attempted between this nurse and patient, however failed, due to patient having technical difficulties OR patient did not have access to video capability.  We continued and completed visit with audio only.  Some vital signs may be absent or patient reported.   Lawrence Mazer E Alverna Fawley, LPN   Review of Systems     Cardiac Risk Factors include: advanced age (>78men, >34 women);dyslipidemia;hypertension;sedentary lifestyle;male gender;Other (see comment);obesity (BMI >30kg/m2), Risk factor comments: hx of stroke     Objective:    Today's Vitals   04/03/21 1319  Weight: 240 lb (108.9 kg)  Height: 5\' 10"  (1.778 m)   Body mass index is 34.44 kg/m.  Advanced Directives 04/03/2021 03/21/2021 04/01/2020 01/24/2020 11/10/2014 11/10/2014 11/02/2014  Does Patient Have a Medical Advance Directive? No No No No No No No  Would patient like information on creating a medical advance directive? No - Patient declined - No - Patient declined No - Patient declined No - patient declined information No - patient declined information No - patient declined information    Current Medications (verified) Outpatient Encounter Medications as of 04/03/2021  Medication Sig   amLODipine (NORVASC) 10 MG tablet Take 1 tablet (10 mg  total) by mouth daily.   atorvastatin (LIPITOR) 80 MG tablet TAKE 1 TABLET (80 MG TOTAL) BY MOUTH DAILY.   clopidogrel (PLAVIX) 75 MG tablet Take 1 tablet by mouth once daily.   divalproex (DEPAKOTE ER) 500 MG 24 hr tablet TAKE 1 TABLET BY MOUTH AT BEDTIME.   LORazepam (ATIVAN) 0.5 MG tablet TAKE 1 TABLET BY MOUTH 3 TIMES DAILY.   PARoxetine (PAXIL) 20 MG tablet TAKE 1 TABLET BY MOUTH AT BEDTIME.   QUEtiapine (SEROQUEL) 50 MG tablet TAKE 1 TABLET BY MOUTH AT BEDTIME.   tamsulosin (FLOMAX) 0.4 MG CAPS capsule TAKE 1 CAPSULE BY MOUTH IN THE MORNING AND AT BEDTIME   traZODone (DESYREL) 50 MG tablet TAKE 1 TABLET BY MOUTH AT BEDTIME.   venlafaxine XR (EFFEXOR-XR) 75 MG 24 hr capsule Take 1 capsule (75 mg total) by mouth daily with breakfast.   aspirin EC 81 MG tablet Take 1 tablet (81 mg total) by mouth daily. (Patient not taking: Reported on 04/03/2021)   No facility-administered encounter medications on file as of 04/03/2021.    Allergies (verified) Patient has no known allergies.   History: Past Medical History:  Diagnosis Date   Anxiety    Atherosclerosis of aorta (HCC)    Atrial contractions, premature    Depression    Dyslipidemia    Elevated glucose 11/11/2015   Hyperlipidemia    Mild hypertension    Urinary hesitancy    Past Surgical History:  Procedure Laterality Date   COLON RESECTION  2012   Family History  Problem Relation Age of Onset   Stroke Father    Bipolar disorder Father  Hypertension Sister    Depression Sister    COPD Mother    Colon cancer Neg Hx    Esophageal cancer Neg Hx    Inflammatory bowel disease Neg Hx    Liver disease Neg Hx    Rectal cancer Neg Hx    Pancreatic cancer Neg Hx    Stomach cancer Neg Hx    Social History   Socioeconomic History   Marital status: Married    Spouse name: Not on file   Number of children: 3   Years of education: Not on file   Highest education level: Not on file  Occupational History   Occupation: retired   Tobacco Use   Smoking status: Former    Packs/day: 0.20    Years: 19.00    Pack years: 3.80    Types: Cigarettes   Smokeless tobacco: Never  Vaping Use   Vaping Use: Never used  Substance and Sexual Activity   Alcohol use: No    Alcohol/week: 0.0 standard drinks   Drug use: No   Sexual activity: Not Currently  Other Topics Concern   Not on file  Social History Narrative   Right handed   Lives with wife   Social Determinants of Health   Financial Resource Strain: Low Risk    Difficulty of Paying Living Expenses: Not hard at all  Food Insecurity: No Food Insecurity   Worried About [place] fund[place] in the Last Year: Never true   [place] in the Last Year: Never true  Transportation Needs: No Transportation Needs   Lack of Transportation (Medical): No   Lack of Transportation (Non-Medical): No  Physical Activity: Inactive   Days of Exercise per Week: 0 days   Minutes of Exercise per Session: 0 min  Stress: No Stress Concern Present   Feeling of Stress : Only a little  Social Connections: Engineer, building services of Communication with Friends and Family: More than three times a week   Frequency of Social Gatherings with Friends and Family: More than three times a week   Attends Religious Services: 1 to 4 times per year   Active Member of [employer] or Organizations: Yes   Attends Archivist Meetings: 1 to 4 times per year   Marital Status: Married    Tobacco Counseling Counseling given: Not Answered   Clinical Intake:  Pre-visit preparation completed: Yes  Pain : No/denies pain     BMI - recorded: 34.44 Nutritional Status: BMI > 30  Obese Nutritional Risks: None Diabetes: No  How often do you need to have someone help you when you read instructions, pamphlets, or other written materials from your doctor or pharmacy?: 1 - Never  Diabetic? No  Interpreter Needed?: No  Information entered by :: Lawrence Ericson, LPN   Activities of  Daily Living In your present state of health, do you have any difficulty performing the following activities: 04/03/2021  Hearing? Y  Vision? N  Difficulty concentrating or making decisions? N  Walking or climbing stairs? N  Dressing or bathing? N  Doing errands, shopping? N  Preparing Food and eating ? N  Using the Toilet? N  In the past six months, have you accidently leaked urine? Y  Comment urge incontinence  Do you have problems with loss of bowel control? Y  Comment IBS, diverticulitis, lactose intolerance  Managing your Medications? N  Managing your Finances? N  Housekeeping or managing your Housekeeping? N  Some recent data might  be hidden    Patient Care Team: [name], DO as PCP - General (Family Medicine) Cloria Spring, MD as Consulting Physician ([facility]) Cameron Sprang, MD as Consulting Physician (Neurology)  Indicate any recent Medical Services you may have received from other than [facility] providers in the past year (date may be approximate).     Assessment:   This is a routine wellness examination for Lawrence Bennett.  Hearing/Vision screen Hearing Screening - Comments:: C/o moderate hearing difficulties - has a hearing amplifier, but no hearing aids Vision Screening - Comments:: Wears eyeglasses - behind on annual eye exams with [facility] in [place] issues and exercise activities discussed: Current Exercise Habits: The patient does not participate in regular exercise at present, Exercise limited by: orthopedic condition(s);neurologic condition(s)   Goals Addressed             This Visit's Progress    Exercise 3x per week (30 min per time)         Depression Screen PHQ 2/9 Scores 04/03/2021 12/16/2020 10/04/2020 08/17/2020 04/01/2020 10/07/2019 08/07/2019  PHQ - 2 Score 0 0 0 0 0 0 0  PHQ- 9 Score - 0 - 0 - 0 -  Some encounter information is confidential and restricted. Go to Review Flowsheets activity to see all data.    Fall Risk Fall  Risk  04/03/2021 03/21/2021 03/10/2021 12/16/2020 10/04/2020  Falls in the past year? 1 1 1  0 0  Number falls in past yr: 1 1 1  - -  Injury with Fall? 1 1 1  - -  Comment - - - - -  Risk for fall due to : History of fall(s);Orthopedic patient;Medication side effect History of fall(s) Impaired balance/gait - -  Follow up Education provided;Falls prevention discussed - Falls evaluation completed - -    FALL RISK PREVENTION PERTAINING TO THE HOME:  Any stairs in or around the home? No  If so, are there any without handrails? No  Home free of loose throw rugs in walkways, pet beds, electrical cords, etc? Yes  Adequate lighting in your home to reduce risk of falls? Yes   ASSISTIVE DEVICES UTILIZED TO PREVENT FALLS:  Life alert? No  Use of a cane, walker or w/c? No  Grab bars in the bathroom? No  Shower chair or bench in shower? No  Elevated toilet seat or a handicapped toilet? No   TIMED UP AND GO:  Was the test performed? No . Telephonic visit  Cognitive Function:     6CIT Screen 04/03/2021 04/01/2020  What Year? 0 points 0 points  What month? 0 points 0 points  What time? 0 points 0 points  Count back from 20 0 points 0 points  Months in reverse 2 points 0 points  Repeat phrase 0 points 0 points  Total Score 2 0    Immunizations Immunization History  Administered Date(s) Administered   Fluad Quad(high Dose 65+) 06/10/2020   Influenza Inj Mdck Quad Pf 03/24/2018   Influenza,inj,Quad PF,6+ Mos 03/23/2016, 03/26/2017, 03/26/2019   PFIZER Comirnaty(Gray Top)Covid-19 Tri-Sucrose Vaccine 11/12/2019, 12/03/2019, 06/20/2020   Pneumococcal Conjugate-13 05/26/2018   Pneumococcal Polysaccharide-23 10/07/2019   Tdap 04/23/2018    TDAP status: Up to date  Flu Vaccine status: Due, Education has been provided regarding the importance of this vaccine. Advised may receive this vaccine at local pharmacy or Health Dept. Aware to provide a copy of the vaccination record if obtained from local  pharmacy or Health Dept. Verbalized acceptance and understanding.  Pneumococcal vaccine status: Up to date  Covid-19 vaccine status: Completed vaccines  Qualifies for Shingles Vaccine? Yes   Zostavax completed Yes   Shingrix Completed?: No.    Education has been provided regarding the importance of this vaccine. Patient has been advised to call insurance company to determine out of pocket expense if they have not yet received this vaccine. Advised may also receive vaccine at local pharmacy or Health Dept. Verbalized acceptance and understanding.  Screening Tests Health Maintenance  Topic Date Due   Zoster Vaccines- Shingrix (1 of 2) Never done   COLONOSCOPY (Pts 45-88yrs Insurance coverage will need to be confirmed)  04/04/2020   COVID-19 Vaccine (4 - Booster for Pfizer series) 10/19/2020   INFLUENZA VACCINE  01/30/2021   TETANUS/TDAP  04/23/2028   Hepatitis C Screening  Completed   HPV VACCINES  Aged Out    Health Maintenance  Health Maintenance Due  Topic Date Due   Zoster Vaccines- Shingrix (1 of 2) Never done   COLONOSCOPY (Pts 45-15yrs Insurance coverage will need to be confirmed)  04/04/2020   COVID-19 Vaccine (4 - Booster for Pfizer series) 10/19/2020   INFLUENZA VACCINE  01/30/2021    Colorectal Cancer Screening: Cologuard ordered today - he declined colonoscopy - in 2011, during colonoscopy, his colon was punctured and he spent 2 weeks in hospital  Lung Cancer Screening: (Low Dose CT Chest recommended if Age 52-80 years, 30 pack-year currently smoking OR have quit w/in 15years.) does not qualify.   Additional Screening:  Hepatitis C Screening: does qualify; Completed 11/11/2015  Vision Screening: Recommended annual ophthalmology exams for early detection of glaucoma and other disorders of the eye. Is the patient up to date with their annual eye exam?  No  Who is the provider or what is the name of the office in which the patient attends annual eye exams? [facility] If pt is not established with a provider, would they like to be referred to a provider to establish care? No .   Dental Screening: Recommended annual dental exams for proper oral hygiene  Community Resource Referral / Chronic Care Management: CRR required this visit?  No   CCM required this visit?  No      Plan:     I have personally reviewed and noted the following in the patient's chart:   Medical and social history Use of alcohol, tobacco or illicit drugs  Current medications and supplements including opioid prescriptions. Patient is not currently taking opioid prescriptions. Functional ability and status Nutritional status Physical activity Advanced directives List of other physicians Hospitalizations, surgeries, and ER visits in previous 12 months Vitals Screenings to include cognitive, depression, and falls Referrals and appointments  In addition, I have reviewed and discussed with patient certain preventive protocols, quality metrics, and best practice recommendations. A written personalized care plan for preventive services as well as general preventive health recommendations were provided to patient.     Sandrea Hammond, LPN   [id number]   Nurse Notes: None

## 2021-04-07 ENCOUNTER — Ambulatory Visit: Payer: Medicare HMO | Admitting: Family Medicine

## 2021-04-07 ENCOUNTER — Ambulatory Visit: Payer: Medicare HMO | Admitting: Cardiology

## 2021-04-10 ENCOUNTER — Other Ambulatory Visit (HOSPITAL_COMMUNITY): Payer: Self-pay

## 2021-04-11 ENCOUNTER — Encounter: Payer: Self-pay | Admitting: Family Medicine

## 2021-04-11 ENCOUNTER — Ambulatory Visit (INDEPENDENT_AMBULATORY_CARE_PROVIDER_SITE_OTHER): Payer: Medicare HMO

## 2021-04-11 ENCOUNTER — Other Ambulatory Visit: Payer: Self-pay

## 2021-04-11 ENCOUNTER — Ambulatory Visit (INDEPENDENT_AMBULATORY_CARE_PROVIDER_SITE_OTHER): Payer: Medicare HMO | Admitting: Family Medicine

## 2021-04-11 VITALS — BP 140/89 | HR 97 | Temp 98.6°F | Ht 70.0 in | Wt 255.0 lb

## 2021-04-11 DIAGNOSIS — I10 Essential (primary) hypertension: Secondary | ICD-10-CM

## 2021-04-11 DIAGNOSIS — S8991XA Unspecified injury of right lower leg, initial encounter: Secondary | ICD-10-CM

## 2021-04-11 DIAGNOSIS — I6930 Unspecified sequelae of cerebral infarction: Secondary | ICD-10-CM

## 2021-04-11 DIAGNOSIS — J392 Other diseases of pharynx: Secondary | ICD-10-CM

## 2021-04-11 DIAGNOSIS — M1711 Unilateral primary osteoarthritis, right knee: Secondary | ICD-10-CM | POA: Diagnosis not present

## 2021-04-11 DIAGNOSIS — Z23 Encounter for immunization: Secondary | ICD-10-CM

## 2021-04-11 NOTE — Patient Instructions (Signed)
Voltaren gel OK for knee pain.  Can use up to 4 times daily on knee ICE Knee sleeve Physical therapy Xray ordered today.  Will call with results. If no improvement after 2 months, plan for check with orthopedics. For sinus/ throat, start Zyrtec/Cetirizine 10mg  at bedtime.

## 2021-04-11 NOTE — Progress Notes (Signed)
Subjective: CC: Follow-up hypertension, CVA PCP: [name], DO Lawrence Bennett is a 65 y.o. male presenting to clinic today for:  1.  Hypertension, CVA Patient has seen neurology.  MRA planned.  He has an appointment with cardiology this week as well.  Overall feeling well.  Not having any issues with the Plavix.  BP has been controlled with increased dose of Norvasc  2.  Right knee pain Patient reports that he has had some ongoing right knee pain and instability following the fall.  He reports some intermittent swelling.  Not currently utilizing anything for this except for a sleeve that his daughter gave him.  3.  Throat irritation Patient reports couple day history of throat irritation.  He reports left-sided irritation specifically.  No fevers, hemoptysis.  Not currently utilizing any medications.   ROS: Per HPI  No Known Allergies Past Medical History:  Diagnosis Date   Anxiety    Atherosclerosis of aorta (HCC)    Atrial contractions, premature    Depression    Dyslipidemia    Elevated glucose 11/11/2015   Hyperlipidemia    Mild hypertension    Urinary hesitancy     Current Outpatient Medications:    amLODipine (NORVASC) 10 MG tablet, Take 1 tablet (10 mg total) by mouth daily., Disp: 90 tablet, Rfl: 3   atorvastatin (LIPITOR) 80 MG tablet, TAKE 1 TABLET (80 MG TOTAL) BY MOUTH DAILY., Disp: 90 tablet, Rfl: 3   clopidogrel (PLAVIX) 75 MG tablet, Take 1 tablet by mouth once daily., Disp: 30 tablet, Rfl: 11   divalproex (DEPAKOTE ER) 500 MG 24 hr tablet, TAKE 1 TABLET BY MOUTH AT BEDTIME., Disp: 90 tablet, Rfl: 2   LORazepam (ATIVAN) 0.5 MG tablet, TAKE 1 TABLET BY MOUTH 3 TIMES DAILY., Disp: 90 tablet, Rfl: 3   PARoxetine (PAXIL) 20 MG tablet, TAKE 1 TABLET BY MOUTH AT BEDTIME., Disp: 90 tablet, Rfl: 2   QUEtiapine (SEROQUEL) 50 MG tablet, TAKE 1 TABLET BY MOUTH AT BEDTIME., Disp: 90 tablet, Rfl: 2   tamsulosin (FLOMAX) 0.4 MG CAPS capsule, TAKE 1 CAPSULE  BY MOUTH IN THE MORNING AND AT BEDTIME, Disp: 180 capsule, Rfl: 0   traZODone (DESYREL) 50 MG tablet, TAKE 1 TABLET BY MOUTH AT BEDTIME., Disp: 90 tablet, Rfl: 2   venlafaxine XR (EFFEXOR-XR) 75 MG 24 hr capsule, Take 1 capsule (75 mg total) by mouth daily with breakfast., Disp: 90 capsule, Rfl: 2   aspirin EC 81 MG tablet, Take 1 tablet (81 mg total) by mouth daily. (Patient not taking: No sig reported), Disp: 90 tablet, Rfl: 3 Social History   Socioeconomic History   Marital status: Married    Spouse name: Not on file   Number of children: 3   Years of education: Not on file   Highest education level: Not on file  Occupational History   Occupation: retired  Tobacco Use   Smoking status: Former    Packs/day: 0.20    Years: 19.00    Pack years: 3.80    Types: Cigarettes   Smokeless tobacco: Never  Vaping Use   Vaping Use: Never used  Substance and Sexual Activity   Alcohol use: No    Alcohol/week: 0.0 standard drinks   Drug use: No   Sexual activity: Not Currently  Other Topics Concern   Not on file  Social History Narrative   Right handed   Lives with wife   Social Determinants of Health   Financial Resource Strain: Low Risk  Difficulty of Paying Living Expenses: Not hard at all  Food Insecurity: No Food Insecurity   Worried About [place] in the Last Year: Never true   Ran Out of Food in the Last Year: Never true  Transportation Needs: No Transportation Needs   Lack of Transportation (Medical): No   Lack of Transportation (Non-Medical): No  Physical Activity: Inactive   Days of Exercise per Week: 0 days   Minutes of Exercise per Session: 0 min  Stress: No Stress Concern Present   Feeling of Stress : Only a little  Social Connections: Engineer, building services of Communication with Friends and Family: More than three times a week   Frequency of Social Gatherings with Friends and Family: More than three times a week   Attends Religious Services:  1 to 4 times per year   Active Member of [employer] or Organizations: Yes   Attends Archivist Meetings: 1 to 4 times per year   Marital Status: Married  Human resources officer Violence: Not At Risk   Fear of Current or Ex-Partner: No   Emotionally Abused: No   Physically Abused: No   Sexually Abused: No   Family History  Problem Relation Age of Onset   Stroke Father    Bipolar disorder Father    Hypertension Sister    Depression Sister    COPD Mother    Colon cancer Neg Hx    Esophageal cancer Neg Hx    Inflammatory bowel disease Neg Hx    Liver disease Neg Hx    Rectal cancer Neg Hx    Pancreatic cancer Neg Hx    Stomach cancer Neg Hx     Objective: Office vital signs reviewed. BP 140/89   Pulse 97   Temp 98.6 F (37 C)   Ht 5\' 10"  (1.778 m)   Wt 255 lb (115.7 kg)   BMI 36.59 kg/m   Physical Examination:  General: Awake, alert, well nourished, No acute distress HEENT: Oropharynx with mildly enlarged tonsils but no exudates.  No lymphadenopathy Cardio: regular rate and rhythm, S1S2 heard, no murmurs appreciated Pulm: clear to auscultation bilaterally, no wheezes, rhonchi or rales; normal work of breathing on room air Extremities: warm, well perfused, No edema, cyanosis or clubbing; +2 pulses bilaterally MSK: antalgic gait and station  Right knee: positive Thessaly. No gross joint effusion, redness or warmth Skin: dry; intact; no rashes or lesions Neuro: hard of hearing  Assessment/ Plan: 65 y.o. male   Injury of right knee, initial encounter - Plan: DG Knee 1-2 Views Right, Ambulatory referral to Physical Therapy  Need for immunization against influenza - Plan: Flu Vaccine QUAD High Dose(Fluad)  Essential hypertension  Sequela of lacunar infarction  Throat irritation  Suspect meniscal injury to the right knee after fall.  Plain films ordered.  Referral to physical therapy for rehab placed.  Home care instructions including RICE discussed.  If no  significant improvement the next couple of months, plan for referral to orthopedics  Influenza vaccination administered  Blood pressure under good control with advancement in Norvasc.  No changes  Continue to follow-up with neurology as scheduled for MRA and cardiology for cardiac testing  Throat irritation likely secondary to postnasal drip in the setting of allergies.  Zyrtec recommended  Orders Placed This Encounter  Procedures   Varicella-zoster vaccine IM (Shingrix)   Flu Vaccine QUAD High Dose(Fluad)   No orders of the defined types were placed in this encounter.  [name], DO [facility] [phone]

## 2021-04-12 ENCOUNTER — Other Ambulatory Visit (HOSPITAL_COMMUNITY): Payer: Self-pay

## 2021-04-13 ENCOUNTER — Other Ambulatory Visit: Payer: Self-pay

## 2021-04-13 ENCOUNTER — Encounter: Payer: Self-pay | Admitting: Cardiology

## 2021-04-13 ENCOUNTER — Other Ambulatory Visit (HOSPITAL_COMMUNITY): Payer: Self-pay

## 2021-04-13 ENCOUNTER — Ambulatory Visit: Payer: Medicare HMO | Admitting: Cardiology

## 2021-04-13 VITALS — BP 137/88 | HR 96 | Temp 97.6°F | Resp 16 | Ht 70.0 in | Wt 252.2 lb

## 2021-04-13 DIAGNOSIS — R9431 Abnormal electrocardiogram [ECG] [EKG]: Secondary | ICD-10-CM | POA: Diagnosis not present

## 2021-04-13 DIAGNOSIS — E782 Mixed hyperlipidemia: Secondary | ICD-10-CM | POA: Diagnosis not present

## 2021-04-13 DIAGNOSIS — Z6836 Body mass index (BMI) 36.0-36.9, adult: Secondary | ICD-10-CM

## 2021-04-13 DIAGNOSIS — I10 Essential (primary) hypertension: Secondary | ICD-10-CM

## 2021-04-13 DIAGNOSIS — Z1211 Encounter for screening for malignant neoplasm of colon: Secondary | ICD-10-CM | POA: Diagnosis not present

## 2021-04-13 DIAGNOSIS — R0609 Other forms of dyspnea: Secondary | ICD-10-CM | POA: Diagnosis not present

## 2021-04-13 DIAGNOSIS — I700 Atherosclerosis of aorta: Secondary | ICD-10-CM | POA: Diagnosis not present

## 2021-04-13 DIAGNOSIS — I7143 Infrarenal abdominal aortic aneurysm, without rupture: Secondary | ICD-10-CM | POA: Diagnosis not present

## 2021-04-13 DIAGNOSIS — Z8673 Personal history of transient ischemic attack (TIA), and cerebral infarction without residual deficits: Secondary | ICD-10-CM | POA: Diagnosis not present

## 2021-04-13 NOTE — Progress Notes (Signed)
Date:  04/13/2021   ID:  Lawrence Bennett, DOB [birth year], MRN [id number]  PCP:  [name], DO  Cardiologist:  Rex Kras, DO, [facility] (established care 04/13/2021) Former Cardiology Providers: Dr. Lyman Bishop   REASON FOR CONSULT: History of stroke, sequelae of lacunar infarct  REQUESTING PHYSICIAN:  [name], DO [place],  [place] [id number]  Chief Complaint  Patient presents with   History of stroke    New Patient (Initial Visit)   Hypertension    HPI  STEPEHN Bennett is a 65 y.o. male who presents to the office with a chief complaint of " evaluation after recent stroke." Patient's past medical history and cardiovascular risk factors include: History of PVCs, small infrarenal aortic aneurysm, former smoker, hypertension, hyperlipidemia, prior lacunar stroke with no residual deficits, aortic atherosclerosis, obesity due to excess calories  He is referred to the office at the request of [name], DO for evaluation of history of stroke, sequelae of lacunar infarct.  Patient is accompanied by his daughter Lawrence Bennett phone [phone]) at today's office visit.  He provides verbal consent with regards to discussing his medical information in her presence.  Patient has been having balance difficulties since July 2022 and followed up with primary care provider who ordered an MRI and was found to have a stroke.  He was referred to neurology for further evaluation and management.  Based on the last neurology notes from 03/21/2021 "  I personally reviewed MRI brain without contrast done 03/17/21 which showed a tiny 76mm focus of restricted diffusion in the left lentiform nucleus consistent with an acute/early subacute lacunar infarct. There were chronic lacunar infarcts in the right thalamus, multiple small vessel infarcts in the left corona radiata and left basal ganglia. There was moderate chronic microvascular disease and diffuse atrophy."  Both patient  and daughter note that since up titration of antihypertensive medications his blood pressures are now better controlled with systolic blood pressures ranging between 120-130 mmHg.  He denies any chest at rest or with effort related activities.  Patient's daughter states that he does have shortness of breath with effort related activities which recently has been noted to be a bit worse.  He denies orthopnea, paroxysmal nocturnal dyspnea.  He does have some bilateral lower extremity swelling.  Patient denies any prior history of irregular heartbeats or atrial fibrillation.  FUNCTIONAL STATUS: No structured exercise program or daily routine but patient keeps himself active around the house.  ALLERGIES: No Known Allergies  MEDICATION LIST PRIOR TO VISIT: Current Meds  Medication Sig   amLODipine (NORVASC) 10 MG tablet Take 1 tablet (10 mg total) by mouth daily.   atorvastatin (LIPITOR) 80 MG tablet TAKE 1 TABLET (80 MG TOTAL) BY MOUTH DAILY.   clopidogrel (PLAVIX) 75 MG tablet Take 1 tablet by mouth once daily.   divalproex (DEPAKOTE ER) 500 MG 24 hr tablet TAKE 1 TABLET BY MOUTH AT BEDTIME.   LORazepam (ATIVAN) 0.5 MG tablet TAKE 1 TABLET BY MOUTH 3 TIMES DAILY.   PARoxetine (PAXIL) 20 MG tablet TAKE 1 TABLET BY MOUTH AT BEDTIME.   QUEtiapine (SEROQUEL) 50 MG tablet TAKE 1 TABLET BY MOUTH AT BEDTIME.   tamsulosin (FLOMAX) 0.4 MG CAPS capsule TAKE 1 CAPSULE BY MOUTH IN THE MORNING AND AT BEDTIME   traZODone (DESYREL) 50 MG tablet TAKE 1 TABLET BY MOUTH AT BEDTIME.   venlafaxine XR (EFFEXOR-XR) 75 MG 24 hr capsule Take 1 capsule (75 mg total) by mouth  daily with breakfast.     PAST MEDICAL HISTORY: Past Medical History:  Diagnosis Date   Anxiety    Atherosclerosis of aorta (HCC)    Atrial contractions, premature    Depression    Dyslipidemia    Elevated glucose 11/11/2015   Hyperlipidemia    Mild hypertension    Stroke [facility])    Urinary hesitancy     PAST SURGICAL HISTORY: Past  Surgical History:  Procedure Laterality Date   COLON RESECTION  2012    FAMILY HISTORY: The patient family history includes Bipolar disorder in his father; COPD in his mother; Depression in his sister; Hypertension in his sister; Stroke in his father.  SOCIAL HISTORY:  The patient  reports that he has quit smoking. His smoking use included cigarettes. He has a 3.80 pack-year smoking history. He has never used smokeless tobacco. He reports that he does not drink alcohol and does not use drugs.  REVIEW OF SYSTEMS: Review of Systems  Constitutional: Negative for chills and fever.  HENT:  Positive for hearing loss (hard of hearing). Negative for hoarse voice and nosebleeds.   Eyes:  Negative for discharge, double vision and pain.  Cardiovascular:  Negative for chest pain, claudication, dyspnea on exertion, leg swelling, near-syncope, orthopnea, palpitations, paroxysmal nocturnal dyspnea and syncope.  Respiratory:  Negative for hemoptysis and shortness of breath.   Musculoskeletal:  Negative for muscle cramps and myalgias.  Gastrointestinal:  Negative for abdominal pain, constipation, diarrhea, hematemesis, hematochezia, melena, nausea and vomiting.  Neurological:  Negative for dizziness and light-headedness.       At times difficulty with balance.    PHYSICAL EXAM: Vitals with BMI 04/13/2021 04/13/2021 04/11/2021  Height - 5\' 10"  -  Weight - 252 lbs 3 oz -  BMI - 19.41 -  Systolic 740 814 481  Diastolic 88 94 89  Pulse 96 96 -  Some encounter information is confidential and restricted. Go to Review Flowsheets activity to see all data.    CONSTITUTIONAL: Well-developed and well-nourished. No acute distress.  SKIN: Skin is warm and dry. No rash noted. No cyanosis. No pallor. No jaundice HEAD: Normocephalic and atraumatic.  EYES: No scleral icterus MOUTH/THROAT: Moist oral membranes.  NECK: No JVD present. No thyromegaly noted. No carotid bruits  LYMPHATIC: No visible cervical  adenopathy.  CHEST Normal respiratory effort. No intercostal retractions  LUNGS: Clear to auscultation bilaterally.  No stridor. No wheezes. No rales.  CARDIOVASCULAR: Regular rate and rhythm, positive S1-S2, no murmurs rubs or gallops appreciated ABDOMINAL: No apparent ascites.  EXTREMITIES: No peripheral edema  HEMATOLOGIC: No significant bruising NEUROLOGIC: Oriented to person, place, and time. Nonfocal. Normal muscle tone.  PSYCHIATRIC: Normal mood and affect. Normal behavior. Cooperative  MRI Brain W/O Contrast: 03/17/2021 3 mm acute/early subacute lacunar infarct within the left basal ganglia.   Chronic small-vessel infarcts within the left corona radiata, left basal ganglia and right thalamus. The small-vessel infarcts within the right thalamus are from the brain MRI of 05/13/2014.   Background mild chronic small vessel ischemic disease within the cerebral white matter, progressed.   Moderate generalized cerebral atrophy, with comparatively mild cerebellar atrophy, also progressed.   Paranasal sinus disease, as described.   Trace fluid within the bilateral mastoid air cells.  CARDIAC DATABASE: EKG: 04/13/2021: Normal sinus rhythm, 87 bpm, normal axis, nonspecific T wave abnormality lateral leads, without underlying injury pattern.  Echocardiogram: No results found for this or any previous visit from the past 1095 days.   Stress Testing: No results  found for this or any previous visit from the past 1095 days.  Heart Catheterization: None  Carotid duplex: 03/11/2014: Trace heterogenous atherosclerotic plaque in bilateral carotid bifurcation without evidence of ICA stenosis.  Vertebral arteries are patent and antegrade flow.  Ultrasound aorta: 03/11/2014: 3.5 x 3.7 cm distal abdominal aortic aneurysm shows mild progression since 2012.  Follow-up study in 2 years.  LABORATORY DATA: CBC Latest Ref Rng & Units 10/07/2020 12/10/2019 03/25/2019  WBC 3.4 - 10.8 x10E3/uL 6.2 5.8  5.5  Hemoglobin 13.0 - 17.7 g/dL 15.2 14.9 14.7  Hematocrit 37.5 - 51.0 % 44.7 44.5 42.2  Platelets 150 - 450 x10E3/uL 304 264.0 283    CMP Latest Ref Rng & Units 03/10/2021 10/07/2020 12/10/2019  Glucose 65 - 99 mg/dL 94 102(H) 142(H)  BUN 8 - 27 mg/dL 12 7(L) 14  Creatinine 0.76 - 1.27 mg/dL 0.98 0.97 1.05  Sodium 134 - 144 mmol/L 136 138 134(L)  Potassium 3.5 - 5.2 mmol/L 4.4 4.5 3.9  Chloride 96 - 106 mmol/L 99 101 101  CO2 20 - 29 mmol/L 21 21 26   Calcium 8.6 - 10.2 mg/dL 9.3 9.2 9.2  Total Protein 6.0 - 8.5 g/dL 6.5 6.4 7.0  Total Bilirubin 0.0 - 1.2 mg/dL 0.4 0.3 0.4  Alkaline Phos 44 - 121 IU/L 107 103 89  AST 0 - 40 IU/L 17 20 16   ALT 0 - 44 IU/L 19 29 22     Lipid Panel     Component Value Date/Time   CHOL 156 10/07/2020 1247   TRIG 103 10/07/2020 1247   HDL 45 10/07/2020 1247   CHOLHDL 3.5 10/07/2020 1247   LDLCALC 92 10/07/2020 1247   LABVLDL 19 10/07/2020 1247    No components found for: NTPROBNP No results for input(s): PROBNP in the last 8760 hours. Recent Labs    03/10/21 0944  TSH 4.420    BMP Recent Labs    10/07/20 1247 03/10/21 0944  NA 138 136  K 4.5 4.4  CL 101 99  CO2 21 21  GLUCOSE 102* 94  BUN 7* 12  CREATININE 0.97 0.98  CALCIUM 9.2 9.3    HEMOGLOBIN A1C Lab Results  Component Value Date   HGBA1C 5.9 (H) 03/10/2021    IMPRESSION:    ICD-10-CM   1. Dyspnea on exertion  R06.09 PCV ECHOCARDIOGRAM COMPLETE    PCV MYOCARDIAL PERFUSION WO LEXISCAN    2. Atherosclerosis of aorta (HCC)  I70.0 PCV MYOCARDIAL PERFUSION WO LEXISCAN    3. Infrarenal abdominal aortic aneurysm (AAA) without rupture  I71.43 PCV AORTA DUPLEX    4. Nonspecific abnormal electrocardiogram (ECG) (EKG)  R94.31 PCV MYOCARDIAL PERFUSION WO LEXISCAN    5. Hx of stroke  Z86.73 EKG 12-Lead    PCV ECHOCARDIOGRAM COMPLETE    PCV MYOCARDIAL PERFUSION WO LEXISCAN    6. Benign hypertension  I10     7. Mixed hyperlipidemia  E78.2     8. Class 2 severe obesity due  to excess calories with serious comorbidity and body mass index (BMI) of 36.0 to 36.9 in adult [facility])  E66.01    Z68.36        RECOMMENDATIONS: LYRICK LAGRAND is a 65 y.o. male whose past medical history and cardiac risk factors include: History of PVCs, small infrarenal aortic aneurysm, former smoker, hypertension, hyperlipidemia, prior lacunar stroke with no residual deficits, aortic atherosclerosis, obesity due to excess calories.  Dyspnea on exertion Echocardiogram will be ordered to evaluate for structural heart disease and left  ventricular systolic function. Given the patient's symptoms, EKG findings, and other cardiovascular risk factors that shared decision was to proceed with stress test.  Plan exercise nuclear stress test to evaluate for functional status and reversible ischemia.  Atherosclerosis of aorta (HCC) Continue antiplatelet therapy and statin  Infrarenal abdominal aortic aneurysm (AAA) without rupture History of infrarenal abdominal aortic aneurysm without subsequent follow-up. No abdominal bruit appreciated. Check abdominal aortic duplex   Hx of stroke Based on recent MRI findings he has had strokes in both right and left hemispheres leading to question of either embolic phenomenon vs. Hx of poorly controlled hypertension (leading to lacunar infarct). We discussed proceeding with either an extended Holter monitor or loop recorder for long-term monitoring of dysrhythmia such as atrial fibrillation. Patient states that she would like to discuss this further with neurology prior to making informed decision.  Benign hypertension Office blood pressures are within acceptable range. Medications reconciled. Low-salt diet emphasized. Currently managed by primary care provider.  Mixed hyperlipidemia Currently on atorvastatin.   He denies myalgia or other side effects. Most recent lipids dated April 2022 reviewed as noted above. Currently managed by primary care  provider.  Class 2 severe obesity due to excess calories with serious comorbidity and body mass index (BMI) of 36.0 to 36.9 in adult [facility]) Body mass index is 36.19 kg/m. I reviewed with the patient the importance of diet, regular physical activity/exercise, weight loss.   Patient is educated on increasing physical activity gradually as tolerated.  With the goal of moderate intensity exercise for 30 minutes a day 5 days a week.  FINAL MEDICATION LIST END OF ENCOUNTER: No orders of the defined types were placed in this encounter.   Medications Discontinued During This Encounter  Medication Reason   aspirin EC 81 MG tablet Error     Current Outpatient Medications:    amLODipine (NORVASC) 10 MG tablet, Take 1 tablet (10 mg total) by mouth daily., Disp: 90 tablet, Rfl: 3   atorvastatin (LIPITOR) 80 MG tablet, TAKE 1 TABLET (80 MG TOTAL) BY MOUTH DAILY., Disp: 90 tablet, Rfl: 3   clopidogrel (PLAVIX) 75 MG tablet, Take 1 tablet by mouth once daily., Disp: 30 tablet, Rfl: 11   divalproex (DEPAKOTE ER) 500 MG 24 hr tablet, TAKE 1 TABLET BY MOUTH AT BEDTIME., Disp: 90 tablet, Rfl: 2   LORazepam (ATIVAN) 0.5 MG tablet, TAKE 1 TABLET BY MOUTH 3 TIMES DAILY., Disp: 90 tablet, Rfl: 3   PARoxetine (PAXIL) 20 MG tablet, TAKE 1 TABLET BY MOUTH AT BEDTIME., Disp: 90 tablet, Rfl: 2   QUEtiapine (SEROQUEL) 50 MG tablet, TAKE 1 TABLET BY MOUTH AT BEDTIME., Disp: 90 tablet, Rfl: 2   tamsulosin (FLOMAX) 0.4 MG CAPS capsule, TAKE 1 CAPSULE BY MOUTH IN THE MORNING AND AT BEDTIME, Disp: 180 capsule, Rfl: 0   traZODone (DESYREL) 50 MG tablet, TAKE 1 TABLET BY MOUTH AT BEDTIME., Disp: 90 tablet, Rfl: 2   venlafaxine XR (EFFEXOR-XR) 75 MG 24 hr capsule, Take 1 capsule (75 mg total) by mouth daily with breakfast., Disp: 90 capsule, Rfl: 2  Orders Placed This Encounter  Procedures   PCV MYOCARDIAL PERFUSION WO LEXISCAN   EKG 12-Lead   PCV ECHOCARDIOGRAM COMPLETE   PCV AORTA DUPLEX    There are no Patient  Instructions on file for this visit.   --Continue cardiac medications as reconciled in final medication list. --Return in about 6 months (around 10/12/2021) for Follow up, Dyspnea, Review test results. Or sooner if needed. --Continue follow-up  with your primary care physician regarding the management of your other chronic comorbid conditions.  Patient's questions and concerns were addressed to his satisfaction. He voices understanding of the instructions provided during this encounter.   This note was created using a voice recognition software as a result there may be grammatical errors inadvertently enclosed that do not reflect the nature of this encounter. Every attempt is made to correct such errors.  Rex Kras, [place], [facility]  Pager: [phone] Office: [phone]

## 2021-04-14 ENCOUNTER — Ambulatory Visit
Admission: RE | Admit: 2021-04-14 | Discharge: 2021-04-14 | Disposition: A | Discharge status: Home / Self Care | Payer: Medicare HMO | Source: Ambulatory Visit | Attending: Neurology | Admitting: Neurology

## 2021-04-14 DIAGNOSIS — R2681 Unsteadiness on feet: Secondary | ICD-10-CM

## 2021-04-14 DIAGNOSIS — I6501 Occlusion and stenosis of right vertebral artery: Secondary | ICD-10-CM | POA: Diagnosis not present

## 2021-04-14 DIAGNOSIS — I672 Cerebral atherosclerosis: Secondary | ICD-10-CM | POA: Diagnosis not present

## 2021-04-14 DIAGNOSIS — I6381 Other cerebral infarction due to occlusion or stenosis of small artery: Secondary | ICD-10-CM

## 2021-04-14 DIAGNOSIS — R2689 Other abnormalities of gait and mobility: Secondary | ICD-10-CM | POA: Diagnosis not present

## 2021-04-14 DIAGNOSIS — M545 Low back pain, unspecified: Secondary | ICD-10-CM

## 2021-04-14 DIAGNOSIS — I6622 Occlusion and stenosis of left posterior cerebral artery: Secondary | ICD-10-CM | POA: Diagnosis not present

## 2021-04-14 DIAGNOSIS — I6523 Occlusion and stenosis of bilateral carotid arteries: Secondary | ICD-10-CM | POA: Diagnosis not present

## 2021-04-14 DIAGNOSIS — I771 Stricture of artery: Secondary | ICD-10-CM | POA: Diagnosis not present

## 2021-04-14 MED ORDER — GADOBENATE DIMEGLUMINE 529 MG/ML IV SOLN
20.0000 mL | Freq: Once | INTRAVENOUS | Status: AC | PRN
  Administered 2021-04-14: 20 mL via INTRAVENOUS

## 2021-04-14 NOTE — Progress Notes (Signed)
R/C please call pt number not wife at [phone]

## 2021-04-19 ENCOUNTER — Telehealth: Payer: Self-pay | Admitting: Neurology

## 2021-04-19 NOTE — Telephone Encounter (Signed)
Pt would like a call to get his results of his MRI

## 2021-04-20 LAB — COLOGUARD: Cologuard: NEGATIVE

## 2021-04-20 NOTE — Telephone Encounter (Signed)
Spoke with pt wife who is on his DPR the blood vessels in the back of the brain did show narrowing but they are not blocked. There is no surgery for this, it is really controlling cholesterol, blood pressure, and continuing Plavix. Make sure his blood pressure does not drop too low or go too high. Proceed with balance therapy if he has not done so yet. Pt wife stated that he will start the balance therapy

## 2021-04-20 NOTE — Telephone Encounter (Signed)
Pt called back in wanting to get his MRI results

## 2021-04-21 ENCOUNTER — Ambulatory Visit: Payer: Medicare HMO

## 2021-04-21 ENCOUNTER — Other Ambulatory Visit: Payer: Self-pay

## 2021-04-21 DIAGNOSIS — R0609 Other forms of dyspnea: Secondary | ICD-10-CM

## 2021-04-21 DIAGNOSIS — Z8673 Personal history of transient ischemic attack (TIA), and cerebral infarction without residual deficits: Secondary | ICD-10-CM

## 2021-04-21 DIAGNOSIS — I7143 Infrarenal abdominal aortic aneurysm, without rupture: Secondary | ICD-10-CM

## 2021-04-21 DIAGNOSIS — I700 Atherosclerosis of aorta: Secondary | ICD-10-CM | POA: Diagnosis not present

## 2021-04-24 ENCOUNTER — Other Ambulatory Visit (HOSPITAL_COMMUNITY): Payer: Self-pay

## 2021-04-25 ENCOUNTER — Other Ambulatory Visit: Payer: Self-pay | Admitting: Student

## 2021-04-25 DIAGNOSIS — R0609 Other forms of dyspnea: Secondary | ICD-10-CM

## 2021-04-25 NOTE — Progress Notes (Signed)
Due to significant progression of abdominal aortic aneurysm do not recommend treadmill stress test.  We will switch patient from treadmill to [facility] nuclear stress test.

## 2021-04-26 ENCOUNTER — Other Ambulatory Visit: Payer: Self-pay

## 2021-04-26 ENCOUNTER — Ambulatory Visit: Payer: Medicare HMO

## 2021-04-26 DIAGNOSIS — R0609 Other forms of dyspnea: Secondary | ICD-10-CM | POA: Diagnosis not present

## 2021-04-26 LAB — PCV MYOCARDIAL PERFUSION WITH LEXISCAN
Angina Index: 0
Base ST Depression (mm): 3 mm

## 2021-05-01 ENCOUNTER — Other Ambulatory Visit (HOSPITAL_COMMUNITY): Payer: Self-pay

## 2021-05-08 ENCOUNTER — Other Ambulatory Visit (HOSPITAL_COMMUNITY): Payer: Self-pay

## 2021-05-08 ENCOUNTER — Other Ambulatory Visit: Payer: Self-pay | Admitting: Family Medicine

## 2021-05-08 DIAGNOSIS — N401 Enlarged prostate with lower urinary tract symptoms: Secondary | ICD-10-CM

## 2021-05-08 MED ORDER — TAMSULOSIN HCL 0.4 MG PO CAPS
ORAL_CAPSULE | ORAL | 1 refills | Status: DC
  Filled 2021-05-08: qty 180, 90d supply, fill #0
  Filled 2021-08-08: qty 180, 90d supply, fill #1

## 2021-05-08 MED FILL — Atorvastatin Calcium Tab 80 MG (Base Equivalent): ORAL | 90 days supply | Qty: 90 | Fill #2 | Status: CN

## 2021-05-08 MED FILL — Atorvastatin Calcium Tab 80 MG (Base Equivalent): ORAL | 90 days supply | Qty: 90 | Fill #2 | Status: AC

## 2021-05-09 ENCOUNTER — Other Ambulatory Visit (HOSPITAL_COMMUNITY): Payer: Self-pay

## 2021-05-17 NOTE — Progress Notes (Signed)
Date:  04/13/2021   ID:  Lawrence Bennett, DOB 1955/10/01, MRN [id number]  PCP:  [name], DO  Cardiologist:  Alethia Berthold, DO, [facility] (established care 04/13/2021) Former Cardiology Providers: Dr. Lyman Bishop   REASON FOR CONSULT: History of stroke, sequelae of lacunar infarct  REQUESTING PHYSICIAN:  [name], DO [place],  [place] [address]  Chief Complaint  Patient presents with   Shortness of Breath   Follow-up   Results    HPI  Lawrence Bennett is a 65 y.o. male who presents to the office with a chief complaint of " evaluation after recent stroke." Patient's past medical history and cardiovascular risk factors include: History of PVCs, small infrarenal aortic aneurysm, former smoker, hypertension, hyperlipidemia, prior lacunar stroke with no residual deficits, aortic atherosclerosis, obesity due to excess calories  He is referred to the office at the request of [name], DO for evaluation of history of stroke, sequelae of lacunar infarct.  Patient is accompanied by his wife at today's office visit.  He provides verbal consent with regards to discussing his medical information in her presence.   Patient presents today for follow-up to discuss results of cardiac testing.  Abdominal aortic duplex noted aneurysm measuring 4.65 x 4.7 x 5.42 cm which has significantly progressed since previous imaging in 2015.  Echocardiogram showed normal LVEF at 55-60% without significant abnormality.Stress test nuclear imaging showed no evidence of ischemia, however patient did have downsloping ST depression in inferior leads persistent for 3 minutes into recovery, likely secondary to hypertensive blood pressure response.  Patient reports he continues to have dyspnea on exertion which is stable compared to last office visit, he is looking forward to starting physical therapy to improve conditioning and weight loss.  Last office visit had recommended  loop recorder given history of stroke, however patient had opted to hold off at that time.  Patient has discussed this with family and neurology and wishes at this time to proceed with loop recorder implantation.  FUNCTIONAL STATUS: No structured exercise program or daily routine but patient keeps himself active around the house.  ALLERGIES: No Known Allergies  MEDICATION LIST PRIOR TO VISIT: Current Meds  Medication Sig   amLODipine (NORVASC) 10 MG tablet Take 1 tablet (10 mg total) by mouth daily.   atorvastatin (LIPITOR) 80 MG tablet TAKE 1 TABLET (80 MG TOTAL) BY MOUTH DAILY.   clopidogrel (PLAVIX) 75 MG tablet Take 1 tablet by mouth once daily.   divalproex (DEPAKOTE ER) 500 MG 24 hr tablet TAKE 1 TABLET BY MOUTH AT BEDTIME.   LORazepam (ATIVAN) 0.5 MG tablet TAKE 1 TABLET BY MOUTH 3 TIMES DAILY.   losartan (COZAAR) 50 MG tablet Take 1 tablet (50 mg total) by mouth daily.   PARoxetine (PAXIL) 20 MG tablet TAKE 1 TABLET BY MOUTH AT BEDTIME.   QUEtiapine (SEROQUEL) 50 MG tablet TAKE 1 TABLET BY MOUTH AT BEDTIME.   tamsulosin (FLOMAX) 0.4 MG CAPS capsule TAKE 1 CAPSULE BY MOUTH IN THE MORNING AND AT BEDTIME   traZODone (DESYREL) 50 MG tablet TAKE 1 TABLET BY MOUTH AT BEDTIME.   venlafaxine XR (EFFEXOR-XR) 75 MG 24 hr capsule Take 1 capsule (75 mg total) by mouth daily with breakfast.     PAST MEDICAL HISTORY: Past Medical History:  Diagnosis Date   Anxiety    Atherosclerosis of aorta (HCC)    Atrial contractions, premature    Depression    Dyslipidemia    Elevated  glucose 11/11/2015   Hyperlipidemia    Mild hypertension    Stroke [facility])    Urinary hesitancy     PAST SURGICAL HISTORY: Past Surgical History:  Procedure Laterality Date   COLON RESECTION  2012    FAMILY HISTORY: The patient family history includes Bipolar disorder in his father; COPD in his mother; Depression in his sister; Hypertension in his sister; Stroke in his father.  SOCIAL HISTORY:  The patient   reports that he has quit smoking. His smoking use included cigarettes. He has a 3.80 pack-year smoking history. He has never used smokeless tobacco. He reports that he does not drink alcohol and does not use drugs.  REVIEW OF SYSTEMS: Review of Systems  Constitutional: Negative for chills and fever.  HENT:  Positive for hearing loss (hard of hearing). Negative for hoarse voice and nosebleeds.   Eyes:  Negative for discharge, double vision and pain.  Cardiovascular:  Positive for dyspnea on exertion (stable compared to previous visit). Negative for chest pain, claudication, leg swelling, near-syncope, orthopnea, palpitations, paroxysmal nocturnal dyspnea and syncope.  Respiratory:  Negative for hemoptysis and shortness of breath.   Musculoskeletal:  Negative for muscle cramps and myalgias.  Gastrointestinal:  Negative for abdominal pain, constipation, diarrhea, hematemesis, hematochezia, melena, nausea and vomiting.  Neurological:  Negative for dizziness and light-headedness.       At times difficulty with balance.    PHYSICAL EXAM: Vitals with BMI 05/19/2021 04/13/2021 04/13/2021  Height 5\' 10"  - 5\' 10"   Weight 252 lbs - 252 lbs 3 oz  BMI 16.10 - 96.04  Systolic 540 981 191  Diastolic 97 88 94  Pulse 93 96 96  Some encounter information is confidential and restricted. Go to Review Flowsheets activity to see all data.    CONSTITUTIONAL: Well-developed and well-nourished. No acute distress.  SKIN: Skin is warm and dry. No rash noted. No cyanosis. No pallor. No jaundice HEAD: Normocephalic and atraumatic.  EYES: No scleral icterus MOUTH/THROAT: Moist oral membranes.  NECK: No JVD present. No thyromegaly noted. No carotid bruits  LYMPHATIC: No visible cervical adenopathy.  CHEST Normal respiratory effort. No intercostal retractions  LUNGS: Clear to auscultation bilaterally.  No stridor. No wheezes. No rales.  CARDIOVASCULAR: Regular rate and rhythm, positive S1-S2, no murmurs rubs or  gallops appreciated ABDOMINAL: No apparent ascites.  EXTREMITIES: No peripheral edema  HEMATOLOGIC: No significant bruising NEUROLOGIC: Oriented to person, place, and time. Nonfocal. Normal muscle tone.  PSYCHIATRIC: Normal mood and affect. Normal behavior. Cooperative Physical exam unchanged compared to previous office visit.  MRI Brain W/O Contrast: 03/17/2021 3 mm acute/early subacute lacunar infarct within the left basal ganglia.   Chronic small-vessel infarcts within the left corona radiata, left basal ganglia and right thalamus. The small-vessel infarcts within the right thalamus are from the brain MRI of 05/13/2014.   Background mild chronic small vessel ischemic disease within the cerebral white matter, progressed.   Moderate generalized cerebral atrophy, with comparatively mild cerebellar atrophy, also progressed.   Paranasal sinus disease, as described.   Trace fluid within the bilateral mastoid air cells.  CARDIAC DATABASE: EKG: 04/13/2021: Normal sinus rhythm, 87 bpm, normal axis, nonspecific T wave abnormality lateral leads, without underlying injury pattern.  Echocardiogram: 04/21/2021: Normal LV systolic function with visual EF 55-60%. Left ventricle cavity is normal in size. Normal wall thickness with basal septal hypertrophy. Normal global wall motion. Normal diastolic filling pattern, normal LAP. Trace tricuspid regurgitation. No evidence of pulmonary hypertension. No prior study for comparison.  Stress Testing: 04/26/2021: Abnormal ECG stress. The patient exercised for 5 minutes and 41 seconds of a Bruce protocol, achieving approximately 4.64 METs.  Reduced exercise tolerance. Hypertensive BP response. Resting EKG demonstrated normal sinus rhythm. No ST-T wave abnormalities. Peak EKG revealed 2 mm down sloping ST depression in the inferior leads persisted for 3 min into recovery. Mild diaphragmatic attenuation noted in the inferior wall. Mild small sized  ischemia in this region cannot be excluded. Gated SPECT imaging of the left ventricle was normal. All segments of left ventricle demonstrated normal wall motion and thickening. No stress lung uptake. TID is normal. Stress LV EF is normal 63%. Intermediate risk study due to abnormal EKG response, however hypertension may induce such change. No prior exam available for comparison.  Heart Catheterization: None  Carotid duplex: 04/21/2021:  Severe dilatation of the abdominal aorta is noted in the distal aorta.  Moderate dilatation of the abdominal aorta is noted in the mid aorta. An abdominal aortic aneurysm measuring 4.65 x 4.7 x 5.42 cm is seen.  Mild plaque noted in the mid and distal aorta.  Iliacs were not visualized due to body habitus.  Compared to CTA abdomen 05/13/2014: infrarenal abdominal aortic aneurysm measures 3.5 by 3.7 cm in diameter.  This study suggests significant progression of the AAA. COnsider CTA to further evaluate or referral to vascular surgery.    03/11/2014: Trace heterogenous atherosclerotic plaque in bilateral carotid bifurcation without evidence of ICA stenosis.  Vertebral arteries are patent and antegrade flow.  Ultrasound aorta: 03/11/2014: 3.5 x 3.7 cm distal abdominal aortic aneurysm shows mild progression since 2012.  Follow-up study in 2 years.  LABORATORY DATA: CBC Latest Ref Rng & Units 10/07/2020 12/10/2019 03/25/2019  WBC 3.4 - 10.8 x10E3/uL 6.2 5.8 5.5  Hemoglobin 13.0 - 17.7 g/dL 15.2 14.9 14.7  Hematocrit 37.5 - 51.0 % 44.7 44.5 42.2  Platelets 150 - 450 x10E3/uL 304 264.0 283    CMP Latest Ref Rng & Units 03/10/2021 10/07/2020 12/10/2019  Glucose 65 - 99 mg/dL 94 102(H) 142(H)  BUN 8 - 27 mg/dL 12 7(L) 14  Creatinine 0.76 - 1.27 mg/dL 0.98 0.97 1.05  Sodium 134 - 144 mmol/L 136 138 134(L)  Potassium 3.5 - 5.2 mmol/L 4.4 4.5 3.9  Chloride 96 - 106 mmol/L 99 101 101  CO2 20 - 29 mmol/L 21 21 26   Calcium 8.6 - 10.2 mg/dL 9.3 9.2 9.2  Total Protein  6.0 - 8.5 g/dL 6.5 6.4 7.0  Total Bilirubin 0.0 - 1.2 mg/dL 0.4 0.3 0.4  Alkaline Phos 44 - 121 IU/L 107 103 89  AST 0 - 40 IU/L 17 20 16   ALT 0 - 44 IU/L 19 29 22     Lipid Panel     Component Value Date/Time   CHOL 156 10/07/2020 1247   TRIG 103 10/07/2020 1247   HDL 45 10/07/2020 1247   CHOLHDL 3.5 10/07/2020 1247   LDLCALC 92 10/07/2020 1247   LABVLDL 19 10/07/2020 1247    No components found for: NTPROBNP No results for input(s): PROBNP in the last 8760 hours. Recent Labs    03/10/21 0944  TSH 4.420    BMP Recent Labs    10/07/20 1247 03/10/21 0944  NA 138 136  K 4.5 4.4  CL 101 99  CO2 21 21  GLUCOSE 102* 94  BUN 7* 12  CREATININE 0.97 0.98  CALCIUM 9.2 9.3    HEMOGLOBIN A1C Lab Results  Component Value Date   HGBA1C 5.9 (H) 03/10/2021  IMPRESSION:    ICD-10-CM   1. Infrarenal abdominal aortic aneurysm (AAA) without rupture  I71.43 Ambulatory referral to Vascular Surgery    2. Benign hypertension  K09 Basic metabolic panel    3. Dyspnea on exertion  R06.09     4. Hx of stroke  Z86.73        RECOMMENDATIONS: Lawrence Bennett is a 65 y.o. male whose past medical history and cardiac risk factors include: History of PVCs, small infrarenal aortic aneurysm, former smoker, hypertension, hyperlipidemia, prior lacunar stroke with no residual deficits, aortic atherosclerosis, obesity due to excess calories.  Dyspnea on exertion Reviewed and discussed at length with patient regarding results of echocardiogram and stress test, details above. Echocardiogram showed preserved LVEF without significant abnormalities. Stress test nuclear imaging showed no evidence of ischemia or infarction.  Patient did have EKG abnormalities likely secondary to hypertensive blood pressure response. Suspect patient's dyspnea on exertion is multifactorial including obesity and deconditioning.  Encourage patient to follow-up with physical therapy to increase physical  activity.  Atherosclerosis of aorta (HCC) Continue antiplatelet therapy and statin  Infrarenal abdominal aortic aneurysm (AAA) without rupture Reviewed and discussed with patient results of aortic duplex, details above Aortic aneurysm has progressed significantly since 2015, will refer to vascular surgery for further evaluation and consultation. Recommend blood pressure control.  Hx of stroke Based on recent MRI findings he has had strokes in both right and left hemispheres leading to question of either embolic phenomenon vs. Hx of poorly controlled hypertension (leading to lacunar infarct). Patient is now willing to proceed with loop recorder implantation for long-term monitoring of this arrhythmia such as atrial fibrillation. Schedule for loop recorder implantation.  Procedure risks versus benefits and alternatives have been discussed, patient verbalized understanding.  Benign hypertension Office and home blood pressure readings are uncontrolled Continue amlodipine Given abdominal aortic aneurysm and uncontrolled blood pressure we will start losartan 50 mg p.o. daily.  We will repeat BMP in 1 week.  Notably patient has been on losartan 100 mg daily in 2016, reason for discontinuing is unclear but patient does not recall any issues with side effects. Counseled patient regarding diet and lifestyle modifications, particularly low-sodium diet and weight loss.  Mixed hyperlipidemia Currently on atorvastatin.   Defer further management to PCP.  Class 2 severe obesity due to excess calories with serious comorbidity and body mass index (BMI) of 36.0 to 36.9 in adult [facility]) Body mass index is 36.16 kg/m. I reviewed with the patient the importance of diet, regular physical activity/exercise, weight loss.   Patient is educated on increasing physical activity gradually as tolerated.  With the goal of moderate intensity exercise for 30 minutes a day 5 days a week.  FINAL MEDICATION LIST END OF  ENCOUNTER: Meds ordered this encounter  Medications   losartan (COZAAR) 50 MG tablet    Sig: Take 1 tablet (50 mg total) by mouth daily.    Dispense:  90 tablet    Refill:  3     There are no discontinued medications.    Current Outpatient Medications:    amLODipine (NORVASC) 10 MG tablet, Take 1 tablet (10 mg total) by mouth daily., Disp: 90 tablet, Rfl: 3   atorvastatin (LIPITOR) 80 MG tablet, TAKE 1 TABLET (80 MG TOTAL) BY MOUTH DAILY., Disp: 90 tablet, Rfl: 3   clopidogrel (PLAVIX) 75 MG tablet, Take 1 tablet by mouth once daily., Disp: 30 tablet, Rfl: 11   divalproex (DEPAKOTE ER) 500 MG 24 hr tablet, TAKE 1  TABLET BY MOUTH AT BEDTIME., Disp: 90 tablet, Rfl: 2   LORazepam (ATIVAN) 0.5 MG tablet, TAKE 1 TABLET BY MOUTH 3 TIMES DAILY., Disp: 90 tablet, Rfl: 3   losartan (COZAAR) 50 MG tablet, Take 1 tablet (50 mg total) by mouth daily., Disp: 90 tablet, Rfl: 3   PARoxetine (PAXIL) 20 MG tablet, TAKE 1 TABLET BY MOUTH AT BEDTIME., Disp: 90 tablet, Rfl: 2   QUEtiapine (SEROQUEL) 50 MG tablet, TAKE 1 TABLET BY MOUTH AT BEDTIME., Disp: 90 tablet, Rfl: 2   tamsulosin (FLOMAX) 0.4 MG CAPS capsule, TAKE 1 CAPSULE BY MOUTH IN THE MORNING AND AT BEDTIME, Disp: 180 capsule, Rfl: 1   traZODone (DESYREL) 50 MG tablet, TAKE 1 TABLET BY MOUTH AT BEDTIME., Disp: 90 tablet, Rfl: 2   venlafaxine XR (EFFEXOR-XR) 75 MG 24 hr capsule, Take 1 capsule (75 mg total) by mouth daily with breakfast., Disp: 90 capsule, Rfl: 2  Orders Placed This Encounter  Procedures   Basic metabolic panel   Ambulatory referral to Vascular Surgery    There are no Patient Instructions on file for this visit.   --Continue cardiac medications as reconciled in final medication list. --Return in about 3 months (around 08/19/2021) for AAA, HTN, HLD. Or sooner if needed. --Continue follow-up with your primary care physician regarding the management of your other chronic comorbid conditions.  Patient's questions and concerns  were addressed to his satisfaction. He voices understanding of the instructions provided during this encounter.   This note was created using a voice recognition software as a result there may be grammatical errors inadvertently enclosed that do not reflect the nature of this encounter. Every attempt is made to correct such errors.   This was a 45-minute encounter with face-to-face counseling, medical records review, coordination of care, explanation of complex medical issues, complex medical decision making.     Alethia Berthold, PA-C 05/19/2021, 2:41 PM Office: [phone]

## 2021-05-19 ENCOUNTER — Other Ambulatory Visit (HOSPITAL_COMMUNITY): Payer: Self-pay

## 2021-05-19 ENCOUNTER — Ambulatory Visit: Payer: Medicare HMO | Admitting: Student

## 2021-05-19 ENCOUNTER — Other Ambulatory Visit: Payer: Self-pay

## 2021-05-19 ENCOUNTER — Encounter: Payer: Self-pay | Admitting: Student

## 2021-05-19 VITALS — BP 142/97 | HR 93 | Temp 97.8°F | Ht 70.0 in | Wt 252.0 lb

## 2021-05-19 DIAGNOSIS — I7143 Infrarenal abdominal aortic aneurysm, without rupture: Secondary | ICD-10-CM | POA: Diagnosis not present

## 2021-05-19 DIAGNOSIS — I10 Essential (primary) hypertension: Secondary | ICD-10-CM | POA: Diagnosis not present

## 2021-05-19 DIAGNOSIS — R0609 Other forms of dyspnea: Secondary | ICD-10-CM | POA: Diagnosis not present

## 2021-05-19 DIAGNOSIS — Z8673 Personal history of transient ischemic attack (TIA), and cerebral infarction without residual deficits: Secondary | ICD-10-CM | POA: Diagnosis not present

## 2021-05-19 MED ORDER — LOSARTAN POTASSIUM 50 MG PO TABS
50.0000 mg | ORAL_TABLET | Freq: Every day | ORAL | 3 refills | Status: DC
  Filled 2021-05-19: qty 90, 90d supply, fill #0
  Filled 2021-08-16: qty 90, 90d supply, fill #1
  Filled 2021-11-18: qty 90, 90d supply, fill #2
  Filled 2022-02-21: qty 90, 90d supply, fill #3

## 2021-05-31 ENCOUNTER — Other Ambulatory Visit (HOSPITAL_COMMUNITY): Payer: Self-pay

## 2021-06-09 ENCOUNTER — Other Ambulatory Visit: Payer: Medicare HMO

## 2021-06-09 DIAGNOSIS — I10 Essential (primary) hypertension: Secondary | ICD-10-CM | POA: Diagnosis not present

## 2021-06-10 LAB — BASIC METABOLIC PANEL
BUN/Creatinine Ratio: 13 (ref 10–24)
BUN: 13 mg/dL (ref 8–27)
CO2: 22 mmol/L (ref 20–29)
Calcium: 9.7 mg/dL (ref 8.6–10.2)
Chloride: 101 mmol/L (ref 96–106)
Creatinine, Ser: 1.01 mg/dL (ref 0.76–1.27)
Glucose: 138 mg/dL — ABNORMAL HIGH (ref 70–99)
Potassium: 4.4 mmol/L (ref 3.5–5.2)
Sodium: 137 mmol/L (ref 134–144)
eGFR: 82 mL/min/{1.73_m2} (ref >59)

## 2021-06-15 ENCOUNTER — Other Ambulatory Visit (HOSPITAL_COMMUNITY): Payer: Self-pay

## 2021-06-15 NOTE — Progress Notes (Signed)
Office Note     CC:  AAA Requesting Provider:  [name], DO  HPI: Lawrence Bennett is a 65 y.o. (1956/06/25) male presenting at the request of .Ronnie Doss M, DO with recent abdominal duplex ultrasonography demonstrating 4.6 x 5.4 cm AAA.   On exam, [name] was doing well.  He denied history of new, sudden onset, back, belly, chest pain.  He has had chronic back pain for quite some time, and is working toward an exercise routine in an effort to lose weight.  Hassell Done has no family history, but was aware of his aneurysm due to a scan back in 2015 demonstrating a size less than 4 cm.  No history of other aneurysmal disease, no history of vascular surgeries  The pt is  on a statin for cholesterol management.  The pt is not on a daily aspirin.   Other AC:  plavix The pt is on on medication for hypertension.   The pt is not diabetic.  Tobacco hx:  none  Past Medical History:  Diagnosis Date   Anxiety    Atherosclerosis of aorta (HCC)    Atrial contractions, premature    Depression    Dyslipidemia    Elevated glucose 11/11/2015   Hyperlipidemia    Mild hypertension    Stroke [facility])    Urinary hesitancy     Past Surgical History:  Procedure Laterality Date   COLON RESECTION  2012    Social History   Socioeconomic History   Marital status: Married    Spouse name: Not on file   Number of children: 3   Years of education: Not on file   Highest education level: Not on file  Occupational History   Occupation: retired  Tobacco Use   Smoking status: Former    Packs/day: 0.20    Years: 19.00    Pack years: 3.80    Types: Cigarettes   Smokeless tobacco: Never  Vaping Use   Vaping Use: Never used  Substance and Sexual Activity   Alcohol use: No    Alcohol/week: 0.0 standard drinks   Drug use: No   Sexual activity: Not Currently  Other Topics Concern   Not on file  Social History Narrative   Right handed   Lives with wife   Social Determinants of Health    Financial Resource Strain: Low Risk    Difficulty of Paying Living Expenses: Not hard at all  Food Insecurity: No Food Insecurity   Worried About Charity fundraiser in the Last Year: Never true   Ran Out of Food in the Last Year: Never true  Transportation Needs: No Transportation Needs   Lack of Transportation (Medical): No   Lack of Transportation (Non-Medical): No  Physical Activity: Inactive   Days of Exercise per Week: 0 days   Minutes of Exercise per Session: 0 min  Stress: No Stress Concern Present   Feeling of Stress : Only a little  Social Connections: Engineer, building services of Communication with Friends and Family: More than three times a week   Frequency of Social Gatherings with Friends and Family: More than three times a week   Attends Religious Services: 1 to 4 times per year   Active Member of [employer] or Organizations: Yes   Attends Archivist Meetings: 1 to 4 times per year   Marital Status: Married  Human resources officer Violence: Not At Risk   Fear of Current or Ex-Partner: No   Emotionally Abused: No  Physically Abused: No   Sexually Abused: No    Family History  Problem Relation Age of Onset   COPD Mother    Stroke Father    Bipolar disorder Father    Hypertension Sister    Depression Sister    Colon cancer Neg Hx    Esophageal cancer Neg Hx    Inflammatory bowel disease Neg Hx    Liver disease Neg Hx    Rectal cancer Neg Hx    Pancreatic cancer Neg Hx    Stomach cancer Neg Hx     Current Outpatient Medications  Medication Sig Dispense Refill   amLODipine (NORVASC) 10 MG tablet Take 1 tablet (10 mg total) by mouth daily. 90 tablet 3   atorvastatin (LIPITOR) 80 MG tablet TAKE 1 TABLET (80 MG TOTAL) BY MOUTH DAILY. 90 tablet 3   clopidogrel (PLAVIX) 75 MG tablet Take 1 tablet by mouth once daily. 30 tablet 11   divalproex (DEPAKOTE ER) 500 MG 24 hr tablet TAKE 1 TABLET BY MOUTH AT BEDTIME. 90 tablet 2   LORazepam (ATIVAN) 0.5 MG  tablet TAKE 1 TABLET BY MOUTH 3 TIMES DAILY. 90 tablet 3   losartan (COZAAR) 50 MG tablet Take 1 tablet (50 mg total) by mouth daily. 90 tablet 3   PARoxetine (PAXIL) 20 MG tablet TAKE 1 TABLET BY MOUTH AT BEDTIME. 90 tablet 2   QUEtiapine (SEROQUEL) 50 MG tablet TAKE 1 TABLET BY MOUTH AT BEDTIME. 90 tablet 2   tamsulosin (FLOMAX) 0.4 MG CAPS capsule TAKE 1 CAPSULE BY MOUTH IN THE MORNING AND AT BEDTIME 180 capsule 1   traZODone (DESYREL) 50 MG tablet TAKE 1 TABLET BY MOUTH AT BEDTIME. 90 tablet 2   venlafaxine XR (EFFEXOR-XR) 75 MG 24 hr capsule Take 1 capsule (75 mg total) by mouth daily with breakfast. 90 capsule 2   No current facility-administered medications for this visit.    No Known Allergies   REVIEW OF SYSTEMS:   [X]  denotes positive finding, [ ]  denotes negative finding Cardiac  Comments:  Chest pain or chest pressure:    Shortness of breath upon exertion:    Short of breath when lying flat:    Irregular heart rhythm:        Vascular    Pain in calf, thigh, or hip brought on by ambulation:    Pain in feet at night that wakes you up from your sleep:     Blood clot in your veins:    Leg swelling:         Pulmonary    Oxygen at home:    Productive cough:     Wheezing:         Neurologic    Sudden weakness in arms or legs:     Sudden numbness in arms or legs:     Sudden onset of difficulty speaking or slurred speech:    Temporary loss of vision in one eye:     Problems with dizziness:         Gastrointestinal    Blood in stool:     Vomited blood:         Genitourinary    Burning when urinating:     Blood in urine:        Psychiatric    Major depression:         Hematologic    Bleeding problems:    Problems with blood clotting too easily:        Skin  Rashes or ulcers:        Constitutional    Fever or chills:      PHYSICAL EXAMINATION:  There were no vitals filed for this visit.  General:  WDWN in NAD; vital signs documented above Gait:  Not observed HENT: WNL, normocephalic Pulmonary: normal non-labored breathing , without wheezing Cardiac: regular HR,  Abdomen: soft, NT, no masses Skin: without rashes Vascular Exam/Pulses:  Right Left  Radial 2+ (normal) 2+ (normal)  Ulnar 2+ (normal) 2+ (normal)  Femoral 2+ (normal) 2+ (normal)  Popliteal trace trace  DP 2+ (normal) 2+ (normal)  PT 2+ (normal) 2+ (normal)   Extremities: without ischemic changes, without Gangrene , without cellulitis; without open wounds;  Musculoskeletal: no muscle wasting or atrophy  Neurologic: A&O X 3;  No focal weakness or paresthesias are detected Psychiatric:  The pt has Normal affect.   Non-Invasive Vascular Imaging:    Abdominal Aortic Duplex 04/21/2021:  Severe dilatation of the abdominal aorta is noted in the distal aorta.  Moderate dilatation of the abdominal aorta is noted in the mid aorta. An  abdominal aortic aneurysm measuring 4.65 x 4.7 x 5.42 cm is seen.     ASSESSMENT/PLAN: IMAD SHOSTAK is a 65 y.o. male presenting with asymptomatic AAA nearing 5.5 cm.  Cederick would benefit from CT angio abdomen pelvis to define aortic anatomy. Once the CT angio has been completed, I will see him again to discuss possible repair versus continued observation. I educated Alma on the signs and symptoms of rupture and asked that he seek immediate medical attention should any of these present. Prior to his next visit, I will also ultrasound his popliteal arteries to ensure he does not have any aneurysmal disease as this can occur in patients with abdominal organisms.   Broadus John, MD [facility] [phone]

## 2021-06-16 ENCOUNTER — Other Ambulatory Visit: Payer: Self-pay

## 2021-06-16 ENCOUNTER — Ambulatory Visit: Payer: Medicare HMO | Admitting: Vascular Surgery

## 2021-06-16 ENCOUNTER — Encounter: Payer: Self-pay | Admitting: Vascular Surgery

## 2021-06-16 VITALS — BP 110/74 | HR 93 | Temp 97.9°F | Resp 20 | Ht 70.0 in | Wt 257.0 lb

## 2021-06-16 DIAGNOSIS — I7143 Infrarenal abdominal aortic aneurysm, without rupture: Secondary | ICD-10-CM

## 2021-06-16 DIAGNOSIS — I714 Abdominal aortic aneurysm, without rupture, unspecified: Secondary | ICD-10-CM

## 2021-06-19 ENCOUNTER — Other Ambulatory Visit: Payer: Self-pay

## 2021-06-19 DIAGNOSIS — I714 Abdominal aortic aneurysm, without rupture, unspecified: Secondary | ICD-10-CM

## 2021-06-21 ENCOUNTER — Ambulatory Visit: Payer: Medicare HMO | Admitting: Cardiology

## 2021-06-22 ENCOUNTER — Other Ambulatory Visit (HOSPITAL_COMMUNITY): Payer: Self-pay

## 2021-06-27 ENCOUNTER — Other Ambulatory Visit (HOSPITAL_COMMUNITY): Payer: Self-pay

## 2021-06-27 ENCOUNTER — Other Ambulatory Visit (HOSPITAL_COMMUNITY): Payer: Self-pay | Admitting: Psychiatry

## 2021-06-27 DIAGNOSIS — Z20822 Contact with and (suspected) exposure to covid-19: Secondary | ICD-10-CM | POA: Diagnosis not present

## 2021-06-27 DIAGNOSIS — Z87891 Personal history of nicotine dependence: Secondary | ICD-10-CM | POA: Diagnosis not present

## 2021-06-27 DIAGNOSIS — J205 Acute bronchitis due to respiratory syncytial virus: Secondary | ICD-10-CM | POA: Diagnosis not present

## 2021-06-28 ENCOUNTER — Other Ambulatory Visit (HOSPITAL_COMMUNITY): Payer: Self-pay

## 2021-06-28 ENCOUNTER — Ambulatory Visit: Payer: Medicare HMO | Admitting: Cardiology

## 2021-06-28 ENCOUNTER — Telehealth (HOSPITAL_COMMUNITY): Payer: Self-pay

## 2021-06-28 MED ORDER — LORAZEPAM 0.5 MG PO TABS
ORAL_TABLET | Freq: Three times a day (TID) | ORAL | 0 refills | Status: DC
  Filled 2021-06-28: qty 21, 7d supply, fill #0

## 2021-06-28 NOTE — Telephone Encounter (Signed)
Dr. Alan Ripper patient. Patient called and stated that he can't get his Lorazepam (Ativan) 0.5mg . Writer spoke with pharmacy and pharmacist stated that he's out of refills. Patient uses [facility] on [address] in [place]. Patient was seen by Dr. Harrington Challenger on 8/15 but is not due for followup until February. Please review and advise. Thank you

## 2021-06-28 NOTE — Telephone Encounter (Signed)
One week supply given by covering provider until Dr Harrington Challenger returns.

## 2021-06-29 ENCOUNTER — Other Ambulatory Visit (HOSPITAL_COMMUNITY): Payer: Self-pay

## 2021-06-29 NOTE — Telephone Encounter (Signed)
NOTIFIED PATIENT °

## 2021-06-30 ENCOUNTER — Other Ambulatory Visit (HOSPITAL_COMMUNITY): Payer: Self-pay

## 2021-06-30 ENCOUNTER — Other Ambulatory Visit (HOSPITAL_COMMUNITY): Payer: Self-pay | Admitting: Psychiatry

## 2021-07-03 ENCOUNTER — Other Ambulatory Visit (HOSPITAL_COMMUNITY): Payer: Self-pay

## 2021-07-03 MED ORDER — LORAZEPAM 0.5 MG PO TABS
ORAL_TABLET | Freq: Three times a day (TID) | ORAL | 2 refills | Status: DC
  Filled 2021-07-03: qty 90, 30d supply, fill #0
  Filled 2021-08-01: qty 90, 30d supply, fill #1
  Filled 2021-09-06: qty 90, 30d supply, fill #2

## 2021-07-04 ENCOUNTER — Other Ambulatory Visit (HOSPITAL_COMMUNITY): Payer: Self-pay

## 2021-07-05 ENCOUNTER — Other Ambulatory Visit (HOSPITAL_COMMUNITY): Payer: Self-pay

## 2021-07-12 ENCOUNTER — Other Ambulatory Visit (HOSPITAL_COMMUNITY): Payer: Self-pay

## 2021-07-13 ENCOUNTER — Other Ambulatory Visit: Payer: Self-pay | Admitting: Vascular Surgery

## 2021-07-13 DIAGNOSIS — I714 Abdominal aortic aneurysm, without rupture, unspecified: Secondary | ICD-10-CM

## 2021-07-14 ENCOUNTER — Ambulatory Visit
Admission: RE | Admit: 2021-07-14 | Discharge: 2021-07-14 | Disposition: A | Discharge status: Home / Self Care | Payer: Medicare HMO | Source: Ambulatory Visit | Attending: Vascular Surgery | Admitting: Vascular Surgery

## 2021-07-14 DIAGNOSIS — K4090 Unilateral inguinal hernia, without obstruction or gangrene, not specified as recurrent: Secondary | ICD-10-CM | POA: Diagnosis not present

## 2021-07-14 DIAGNOSIS — K5730 Diverticulosis of large intestine without perforation or abscess without bleeding: Secondary | ICD-10-CM | POA: Diagnosis not present

## 2021-07-14 DIAGNOSIS — I7143 Infrarenal abdominal aortic aneurysm, without rupture: Secondary | ICD-10-CM | POA: Diagnosis not present

## 2021-07-14 MED ORDER — IOPAMIDOL (ISOVUE-370) INJECTION 76%
75.0000 mL | Freq: Once | INTRAVENOUS | Status: AC | PRN
  Administered 2021-07-14: 75 mL via INTRAVENOUS

## 2021-07-19 ENCOUNTER — Other Ambulatory Visit (HOSPITAL_COMMUNITY): Payer: Self-pay

## 2021-07-19 NOTE — Progress Notes (Signed)
Office Note     CC:  AAA Requesting Provider:  [name], DO  HPI: Lawrence Bennett is a 66 y.o. (1956/02/04) male presenting in follow-up after recent abdominal duplex ultrasound demonstrated 5.4 cm abdominal aortic aneurysm.  He presents today accompanied by his wife.  Since his last visit, Lawrence Bennett underwent CT angiography of his abdomen and pelvis to further define his infrarenal abdominal aorta.  He has had no new symptoms, and denied history of back pain, belly pain, chest pain-outside of his chronic back pain that has been present for quite some time.  At her last visit, we discussed working with physical therapy, but he elected to hold off until his aneurysm was repaired.  Lawrence Bennett had an abdominal aortic aneurysm. No history of vascular surgeries  The pt is  on a statin for cholesterol management.  The pt is not on a daily aspirin.   Other AC:  plavix The pt is on on medication for hypertension.   The pt is not diabetic.  Tobacco hx:  none  Past Medical History:  Diagnosis Date   AAA (abdominal aortic aneurysm)    Anxiety    Atherosclerosis of aorta (HCC)    Atrial contractions, premature    Depression    Dyslipidemia    Elevated glucose 11/11/2015   Hyperlipidemia    Mild hypertension    Stroke [facility])    Urinary hesitancy     Past Surgical History:  Procedure Laterality Date   COLON RESECTION  2012    Social History   Socioeconomic History   Marital status: Married    Spouse name: Not on file   Number of children: 3   Years of education: Not on file   Highest education level: Not on file  Occupational History   Occupation: retired  Tobacco Use   Smoking status: Former    Packs/day: 0.20    Years: 19.00    Pack years: 3.80    Types: Cigarettes   Smokeless tobacco: Never  Vaping Use   Vaping Use: Never used  Substance and Sexual Activity   Alcohol use: No    Alcohol/week: 0.0 standard drinks   Drug use: No   Sexual activity: Not  Currently  Other Topics Concern   Not on file  Social History Narrative   Right handed   Lives with wife   Social Determinants of Health   Financial Resource Strain: Low Risk    Difficulty of Paying Living Expenses: Not hard at all  Food Insecurity: No Food Insecurity   Worried About Charity fundraiser in the Last Year: Never true   Ran Out of Food in the Last Year: Never true  Transportation Needs: No Transportation Needs   Lack of Transportation (Medical): No   Lack of Transportation (Non-Medical): No  Physical Activity: Inactive   Days of Exercise per Week: 0 days   Minutes of Exercise per Session: 0 min  Stress: No Stress Concern Present   Feeling of Stress : Only a little  Social Connections: Engineer, building services of Communication with Friends and Family: More than three times a week   Frequency of Social Gatherings with Friends and Family: More than three times a week   Attends Religious Services: 1 to 4 times per year   Active Member of [employer] or Organizations: Yes   Attends Archivist Meetings: 1 to 4 times per year   Marital Status: Married  Human resources officer Violence: Not At Risk  Fear of Current or Ex-Partner: No   Emotionally Abused: No   Physically Abused: No   Sexually Abused: No    Family History  Problem Relation Age of Onset   COPD Bennett    Stroke Father    Bipolar disorder Father    Hypertension Sister    Depression Sister    Colon cancer Neg Hx    Esophageal cancer Neg Hx    Inflammatory bowel disease Neg Hx    Liver disease Neg Hx    Rectal cancer Neg Hx    Pancreatic cancer Neg Hx    Stomach cancer Neg Hx     Current Outpatient Medications  Medication Sig Dispense Refill   amLODipine (NORVASC) 10 MG tablet Take 1 tablet (10 mg total) by mouth daily. 90 tablet 3   atorvastatin (LIPITOR) 80 MG tablet TAKE 1 TABLET (80 MG TOTAL) BY MOUTH DAILY. 90 tablet 3   clopidogrel (PLAVIX) 75 MG tablet Take 1 tablet by mouth once  daily. 30 tablet 11   divalproex (DEPAKOTE ER) 500 MG 24 hr tablet TAKE 1 TABLET BY MOUTH AT BEDTIME. 90 tablet 2   LORazepam (ATIVAN) 0.5 MG tablet TAKE 1 TABLET BY MOUTH 3 TIMES DAILY. 90 tablet 2   losartan (COZAAR) 50 MG tablet Take 1 tablet (50 mg total) by mouth daily. 90 tablet 3   PARoxetine (PAXIL) 20 MG tablet TAKE 1 TABLET BY MOUTH AT BEDTIME. 90 tablet 2   QUEtiapine (SEROQUEL) 50 MG tablet TAKE 1 TABLET BY MOUTH AT BEDTIME. 90 tablet 2   tamsulosin (FLOMAX) 0.4 MG CAPS capsule TAKE 1 CAPSULE BY MOUTH IN THE MORNING AND AT BEDTIME 180 capsule 1   traZODone (DESYREL) 50 MG tablet TAKE 1 TABLET BY MOUTH AT BEDTIME. 90 tablet 2   venlafaxine XR (EFFEXOR-XR) 75 MG 24 hr capsule Take 1 capsule (75 mg total) by mouth daily with breakfast. 90 capsule 2   No current facility-administered medications for this visit.    No Known Allergies   REVIEW OF SYSTEMS:   [X]  denotes positive finding, [ ]  denotes negative finding Cardiac  Comments:  Chest pain or chest pressure:    Shortness of breath upon exertion:    Short of breath when lying flat:    Irregular heart rhythm:        Vascular    Pain in calf, thigh, or hip brought on by ambulation:    Pain in feet at night that wakes you up from your sleep:     Blood clot in your veins:    Leg swelling:         Pulmonary    Oxygen at home:    Productive cough:     Wheezing:         Neurologic    Sudden weakness in arms or legs:     Sudden numbness in arms or legs:     Sudden onset of difficulty speaking or slurred speech:    Temporary loss of vision in one eye:     Problems with dizziness:         Gastrointestinal    Blood in stool:     Vomited blood:         Genitourinary    Burning when urinating:     Blood in urine:        Psychiatric    Major depression:         Hematologic    Bleeding problems:    Problems with blood  clotting too easily:        Skin    Rashes or ulcers:        Constitutional    Fever or  chills:      PHYSICAL EXAMINATION:  There were no vitals filed for this visit.  General:  WDWN in NAD; vital signs documented above Gait: Not observed HENT: WNL, normocephalic Pulmonary: normal non-labored breathing , without wheezing Cardiac: regular HR,  Abdomen: soft, NT, no masses Skin: without rashes Vascular Exam/Pulses:  Right Left  Radial 2+ (normal) 2+ (normal)  Ulnar 2+ (normal) 2+ (normal)  Femoral 2+ (normal) 2+ (normal)  Popliteal trace trace  DP 2+ (normal) 2+ (normal)  PT 2+ (normal) 2+ (normal)   Extremities: without ischemic changes, without Gangrene , without cellulitis; without open wounds;  Musculoskeletal: no muscle wasting or atrophy  Neurologic: A&O X 3;  No focal weakness or paresthesias are detected Psychiatric:  The pt has Normal affect.   Non-Invasive Vascular Imaging:    07/14/2021 CT angio abdomen pelvis was independently reviewed demonstrating an infrarenal abdominal aortic aneurysm measuring 5.7 cm in greatest diameter.  Patient has bilateral accessory renal arteries.  No popliteal artery aneurysm appreciated.    ASSESSMENT/PLAN: Lawrence Bennett is a 66 y.o. male presenting with 5.7 cm asymptomatic abdominal aortic aneurysm.  This meets criteria for elective repair.  I had a long discussion with Lawrence Bennett regarding the above, including the risks and benefits of continued medical management versus surgery. Lawrence Bennett is not a good open surgical candidate due to obesity, and would be best served with endovascular aortic repair. 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  In order to seal the aneurysm, I will likely have to place the stent graft above the left accessory renal which has a possibility of affecting his kidney function as it appears the left accessory renal perfuses the inferior pole of the  left kidney. I also discussed the risk of aortic rupture, access complications, immediate and delayed endoleak's. After discussing the risks and benefits of endovascular aortic repair, Lawrence Bennett elected to proceed.  Although the patient underwent stress testing and October, I asked that he see his cardiology for preoperative risk assessment prior to scheduling repair. As discussed at his last visit, I asked that he seek immediate medical attention should new onset, severe chest, back, abdominal pain occur.   Lawrence John, MD [facility] [phone]

## 2021-07-21 ENCOUNTER — Encounter: Payer: Self-pay | Admitting: Vascular Surgery

## 2021-07-21 ENCOUNTER — Other Ambulatory Visit: Payer: Self-pay

## 2021-07-21 ENCOUNTER — Ambulatory Visit (HOSPITAL_COMMUNITY)
Admission: RE | Admit: 2021-07-21 | Discharge: 2021-07-21 | Disposition: A | Discharge status: Home / Self Care | Payer: Medicare HMO | Source: Ambulatory Visit | Attending: Vascular Surgery | Admitting: Vascular Surgery

## 2021-07-21 ENCOUNTER — Ambulatory Visit: Payer: Medicare HMO | Admitting: Vascular Surgery

## 2021-07-21 VITALS — BP 121/78 | HR 95 | Temp 98.1°F | Resp 20 | Ht 70.0 in | Wt 255.0 lb

## 2021-07-21 DIAGNOSIS — I7143 Infrarenal abdominal aortic aneurysm, without rupture: Secondary | ICD-10-CM | POA: Diagnosis not present

## 2021-07-21 DIAGNOSIS — I714 Abdominal aortic aneurysm, without rupture, unspecified: Secondary | ICD-10-CM | POA: Diagnosis not present

## 2021-07-31 ENCOUNTER — Other Ambulatory Visit (HOSPITAL_COMMUNITY): Payer: Self-pay

## 2021-08-01 ENCOUNTER — Other Ambulatory Visit (HOSPITAL_COMMUNITY): Payer: Self-pay

## 2021-08-08 ENCOUNTER — Ambulatory Visit: Payer: Medicare HMO | Admitting: Cardiology

## 2021-08-09 ENCOUNTER — Telehealth: Payer: Self-pay | Admitting: Cardiology

## 2021-08-09 ENCOUNTER — Other Ambulatory Visit (HOSPITAL_COMMUNITY): Payer: Self-pay

## 2021-08-09 DIAGNOSIS — Z01810 Encounter for preprocedural cardiovascular examination: Secondary | ICD-10-CM | POA: Diagnosis not present

## 2021-08-09 NOTE — Telephone Encounter (Signed)
PRE-OPERATIVE CARDIAC RISK ASSESSMENT  08/09/21  Patient's name: Lawrence Bennett.   MRN: [id number].    DOB: [birth year]  Primary care provider: [name], DO. Referral provider: No referring provider defined for this encounter.  RENALD Bennett is a 66 y.o. male for which I have been requested to assess estimated cardiac risk for planned non-cardiac surgery (EVAR).  Spoke with patient regarding his preoperative risk stratification and he is being scheduled for EVAR with Dr. Carlena Hurl.  Since last office visit patient denies any anginal discomfort or heart failure symptoms.  His overall functional status remains relatively stable.  Recent echocardiogram noted preserved LVEF, no regional wall motion abnormalities, no significant valvular heart disease.  Patient's recent stress test from October 2022 independently reviewed which notes no significant reversible ischemia.  No prior history of insulin-dependent diabetes, renal function relatively stable, no significant valvular heart disease.  Prior history of stroke without residual deficits.  From a cardiovascular standpoint patient appears to be acceptable/low risk for upcoming noncardiac surgery.  He is overall optimized from a cardiovascular standpoint and no prohibitive factors to delay planned EVAR.   Patient is agreeable and accepts his preoperative risk stratification.  This preoperative risk assessment is a tool to assist the surgeon in estimating the cardiac risk for the proposed upcoming noncardiac surgery.  The shared decision to proceed with surgery will be ultimately at the discretion of the patient after the surgical risks, benefits, and alternatives have been discussed amongst the patient and his surgical team.   Sincerely,   Mechele Claude, [facility]  Pager: [phone] Office: [phone]

## 2021-08-10 ENCOUNTER — Other Ambulatory Visit: Payer: Self-pay

## 2021-08-10 ENCOUNTER — Encounter: Payer: Self-pay | Admitting: *Deleted

## 2021-08-10 DIAGNOSIS — I714 Abdominal aortic aneurysm, without rupture, unspecified: Secondary | ICD-10-CM

## 2021-08-14 ENCOUNTER — Encounter: Payer: Self-pay | Admitting: Vascular Surgery

## 2021-08-15 ENCOUNTER — Other Ambulatory Visit: Payer: Self-pay | Admitting: Family Medicine

## 2021-08-15 DIAGNOSIS — E782 Mixed hyperlipidemia: Secondary | ICD-10-CM

## 2021-08-16 ENCOUNTER — Other Ambulatory Visit (HOSPITAL_COMMUNITY): Payer: Self-pay

## 2021-08-16 MED ORDER — ATORVASTATIN CALCIUM 80 MG PO TABS
ORAL_TABLET | Freq: Every day | ORAL | 0 refills | Status: DC
  Filled 2021-08-16: qty 90, 90d supply, fill #0

## 2021-08-17 ENCOUNTER — Telehealth: Payer: Self-pay

## 2021-08-17 NOTE — Progress Notes (Signed)
Surgical Instructions    Your procedure is scheduled on 08/21/21.  Report to [facility] Main Entrance "A" at 5:30 A.M., then check in with the Admitting office.  Call this number if you have problems the morning of surgery:  [phone]   If you have any questions prior to your surgery date call [phone]: Open Monday-Friday 8am-4pm    Remember:  Do not eat or drink after midnight the night before your surgery     Take these medicines the morning of surgery with A SIP OF WATER:  amLODipine (NORVASC)  atorvastatin (LIPITOR) LORazepam (ATIVAN) tamsulosin (FLOMAX) venlafaxine XR (EFFEXOR-XR)  Follow your surgeon's instructions on when to stop Plavix.  If no instructions were given by your surgeon then you will need to call the office to get those instructions.      As of today, STOP taking any Aspirin (unless otherwise instructed by your surgeon) Aleve, Naproxen, Ibuprofen, Motrin, Advil, Goody's, BC's, all herbal medications, fish oil, and all vitamins.           Do not wear jewelry  Do not wear lotions, powders, colognes, or deodorant. Do not shave 48 hours prior to surgery.  Men may shave face and neck. Do not bring valuables to the hospital.   [facility] is not responsible for any belongings or valuables. .   Do NOT Smoke (Tobacco/Vaping)  24 hours prior to your procedure  If you use a CPAP at night, you may bring your mask for your overnight stay.   Contacts, glasses, hearing aids, dentures or partials may not be worn into surgery, please bring cases for these belongings   For patients admitted to the hospital, discharge time will be determined by your treatment team.   Patients discharged the day of surgery will not be allowed to drive home, and someone needs to stay with them for 24 hours.  NO VISITORS WILL BE ALLOWED IN PRE-OP WHERE PATIENTS ARE PREPPED FOR SURGERY.  ONLY 1 SUPPORT PERSON MAY BE PRESENT IN THE WAITING ROOM WHILE YOU ARE IN SURGERY.  IF YOU ARE  TO BE ADMITTED, ONCE YOU ARE IN YOUR ROOM YOU WILL BE ALLOWED TWO (2) VISITORS. 1 (ONE) VISITOR MAY STAY OVERNIGHT BUT MUST ARRIVE TO THE ROOM BY 8pm.  Minor children may have two parents present. Special consideration for safety and communication needs will be reviewed on a case by case basis.  Special instructions:    Oral Hygiene is also important to reduce your risk of infection.  Remember - BRUSH YOUR TEETH THE MORNING OF SURGERY WITH YOUR REGULAR TOOTHPASTE   [facility]- Preparing For Surgery  Before surgery, you can play an important role. Because skin is not sterile, your skin needs to be as free of germs as possible. You can reduce the number of germs on your skin by washing with CHG (chlorahexidine gluconate) Soap before surgery.  CHG is an antiseptic cleaner which kills germs and bonds with the skin to continue killing germs even after washing.     Please do not use if you have an allergy to CHG or antibacterial soaps. If your skin becomes reddened/irritated stop using the CHG.  Do not shave (including legs and underarms) for at least 48 hours prior to first CHG shower. It is OK to shave your face.  Please follow these instructions carefully.     Shower the NIGHT BEFORE SURGERY and the MORNING OF SURGERY with CHG Soap.   If you chose to wash your hair, wash your  hair first as usual with your normal shampoo. After you shampoo, rinse your hair and body thoroughly to remove the shampoo.  Then [employer] and genitals (private parts) with your normal soap and rinse thoroughly to remove soap.  After that Use CHG Soap as you would any other liquid soap. You can apply CHG directly to the skin and wash gently with a scrungie or a clean washcloth.   Apply the CHG Soap to your body ONLY FROM THE NECK DOWN.  Do not use on open wounds or open sores. Avoid contact with your eyes, ears, mouth and genitals (private parts). Wash Face and genitals (private parts)  with your normal soap.   Wash  thoroughly, paying special attention to the area where your surgery will be performed.  Thoroughly rinse your body with warm water from the neck down.  DO NOT shower/wash with your normal soap after using and rinsing off the CHG Soap.  Pat yourself dry with a CLEAN TOWEL.  Wear CLEAN PAJAMAS to bed the night before surgery  Place CLEAN SHEETS on your bed the night before your surgery  DO NOT SLEEP WITH PETS.   Day of Surgery:  Take a shower with CHG soap. Wear Clean/Comfortable clothing the morning of surgery Do not apply any deodorants/lotions.   Remember to brush your teeth WITH YOUR REGULAR TOOTHPASTE.    COVID testing  If you are going to stay overnight or be admitted after your procedure/surgery and require a pre-op COVID test, please follow these instructions after your COVID test   You are not required to quarantine however you are required to wear a well-fitting mask when you are out and around people not in your household.  If your mask becomes wet or soiled, replace with a new one.  Wash your hands often with soap and water for 20 seconds or clean your hands with an alcohol-based hand sanitizer that contains at least 60% alcohol.  Do not share personal items.  Notify your provider: if you are in close contact with someone who has COVID  or if you develop a fever of 100.4 or greater, sneezing, cough, sore throat, shortness of breath or body aches.    Please read over the following fact sheets that you were given.

## 2021-08-18 ENCOUNTER — Encounter (HOSPITAL_COMMUNITY): Payer: Self-pay

## 2021-08-18 ENCOUNTER — Other Ambulatory Visit: Payer: Self-pay

## 2021-08-18 ENCOUNTER — Encounter (HOSPITAL_COMMUNITY)
Admission: RE | Admit: 2021-08-18 | Discharge: 2021-08-18 | Disposition: A | Discharge status: Home / Self Care | Payer: Medicare HMO | Source: Ambulatory Visit | Attending: Vascular Surgery | Admitting: Vascular Surgery

## 2021-08-18 VITALS — BP 142/94 | HR 94 | Temp 98.6°F | Resp 17 | Ht 70.0 in | Wt 258.4 lb

## 2021-08-18 DIAGNOSIS — Z01812 Encounter for preprocedural laboratory examination: Secondary | ICD-10-CM | POA: Insufficient documentation

## 2021-08-18 DIAGNOSIS — I10 Essential (primary) hypertension: Secondary | ICD-10-CM | POA: Diagnosis present

## 2021-08-18 DIAGNOSIS — R7303 Prediabetes: Secondary | ICD-10-CM | POA: Insufficient documentation

## 2021-08-18 DIAGNOSIS — I714 Abdominal aortic aneurysm, without rupture, unspecified: Secondary | ICD-10-CM | POA: Diagnosis not present

## 2021-08-18 DIAGNOSIS — I701 Atherosclerosis of renal artery: Secondary | ICD-10-CM | POA: Diagnosis not present

## 2021-08-18 DIAGNOSIS — Z20822 Contact with and (suspected) exposure to covid-19: Secondary | ICD-10-CM | POA: Insufficient documentation

## 2021-08-18 DIAGNOSIS — E785 Hyperlipidemia, unspecified: Secondary | ICD-10-CM | POA: Diagnosis present

## 2021-08-18 DIAGNOSIS — F418 Other specified anxiety disorders: Secondary | ICD-10-CM | POA: Diagnosis not present

## 2021-08-18 DIAGNOSIS — Z8673 Personal history of transient ischemic attack (TIA), and cerebral infarction without residual deficits: Secondary | ICD-10-CM | POA: Insufficient documentation

## 2021-08-18 DIAGNOSIS — Z9889 Other specified postprocedural states: Secondary | ICD-10-CM | POA: Diagnosis not present

## 2021-08-18 DIAGNOSIS — I700 Atherosclerosis of aorta: Secondary | ICD-10-CM | POA: Diagnosis present

## 2021-08-18 DIAGNOSIS — Z6837 Body mass index (BMI) 37.0-37.9, adult: Secondary | ICD-10-CM | POA: Diagnosis not present

## 2021-08-18 DIAGNOSIS — Z79899 Other long term (current) drug therapy: Secondary | ICD-10-CM | POA: Diagnosis not present

## 2021-08-18 DIAGNOSIS — Z87891 Personal history of nicotine dependence: Secondary | ICD-10-CM | POA: Diagnosis not present

## 2021-08-18 DIAGNOSIS — F419 Anxiety disorder, unspecified: Secondary | ICD-10-CM | POA: Diagnosis present

## 2021-08-18 DIAGNOSIS — Z7902 Long term (current) use of antithrombotics/antiplatelets: Secondary | ICD-10-CM | POA: Insufficient documentation

## 2021-08-18 DIAGNOSIS — R0902 Hypoxemia: Secondary | ICD-10-CM | POA: Diagnosis not present

## 2021-08-18 DIAGNOSIS — Z01818 Encounter for other preprocedural examination: Secondary | ICD-10-CM

## 2021-08-18 DIAGNOSIS — I7143 Infrarenal abdominal aortic aneurysm, without rupture: Secondary | ICD-10-CM | POA: Diagnosis present

## 2021-08-18 DIAGNOSIS — Q272 Other congenital malformations of renal artery: Secondary | ICD-10-CM | POA: Diagnosis not present

## 2021-08-18 DIAGNOSIS — I2510 Atherosclerotic heart disease of native coronary artery without angina pectoris: Secondary | ICD-10-CM | POA: Diagnosis present

## 2021-08-18 DIAGNOSIS — Z7982 Long term (current) use of aspirin: Secondary | ICD-10-CM | POA: Insufficient documentation

## 2021-08-18 DIAGNOSIS — F32A Depression, unspecified: Secondary | ICD-10-CM | POA: Diagnosis present

## 2021-08-18 DIAGNOSIS — I513 Intracardiac thrombosis, not elsewhere classified: Secondary | ICD-10-CM | POA: Diagnosis present

## 2021-08-18 DIAGNOSIS — Z823 Family history of stroke: Secondary | ICD-10-CM | POA: Diagnosis not present

## 2021-08-18 DIAGNOSIS — E669 Obesity, unspecified: Secondary | ICD-10-CM | POA: Diagnosis present

## 2021-08-18 DIAGNOSIS — Z818 Family history of other mental and behavioral disorders: Secondary | ICD-10-CM | POA: Diagnosis not present

## 2021-08-18 DIAGNOSIS — Z8249 Family history of ischemic heart disease and other diseases of the circulatory system: Secondary | ICD-10-CM | POA: Diagnosis not present

## 2021-08-18 HISTORY — DX: Dyspnea, unspecified: R06.00

## 2021-08-18 LAB — COMPREHENSIVE METABOLIC PANEL WITH GFR
ALT: 33 U/L (ref 0–44)
AST: 20 U/L (ref 15–41)
Albumin: 3.6 g/dL (ref 3.5–5.0)
Alkaline Phosphatase: 84 U/L (ref 38–126)
Anion gap: 9 (ref 5–15)
BUN: 11 mg/dL (ref 8–23)
CO2: 24 mmol/L (ref 22–32)
Calcium: 9.3 mg/dL (ref 8.9–10.3)
Chloride: 104 mmol/L (ref 98–111)
Creatinine, Ser: 1 mg/dL (ref 0.61–1.24)
GFR, Estimated: >60 mL/min
Glucose, Bld: 132 mg/dL — ABNORMAL HIGH (ref 70–99)
Potassium: 4.3 mmol/L (ref 3.5–5.1)
Sodium: 137 mmol/L (ref 135–145)
Total Bilirubin: 0.7 mg/dL (ref 0.3–1.2)
Total Protein: 6.8 g/dL (ref 6.5–8.1)

## 2021-08-18 LAB — URINALYSIS, ROUTINE W REFLEX MICROSCOPIC
Bilirubin Urine: NEGATIVE
Glucose, UA: NEGATIVE mg/dL
Hgb urine dipstick: NEGATIVE
Ketones, ur: NEGATIVE mg/dL
Leukocytes,Ua: NEGATIVE
Nitrite: NEGATIVE
Protein, ur: NEGATIVE mg/dL
Specific Gravity, Urine: 1.005 (ref 1.005–1.030)
pH: 7 (ref 5.0–8.0)

## 2021-08-18 LAB — CBC
HCT: 44.2 % (ref 39.0–52.0)
Hemoglobin: 14.4 g/dL (ref 13.0–17.0)
MCH: 29.6 pg (ref 26.0–34.0)
MCHC: 32.6 g/dL (ref 30.0–36.0)
MCV: 90.8 fL (ref 80.0–100.0)
Platelets: 279 10*3/uL (ref 150–400)
RBC: 4.87 MIL/uL (ref 4.22–5.81)
RDW: 14.6 % (ref 11.5–15.5)
WBC: 7.1 10*3/uL (ref 4.0–10.5)
nRBC: 0 % (ref 0.0–0.2)

## 2021-08-18 LAB — SURGICAL PCR SCREEN
MRSA, PCR: NEGATIVE
Staphylococcus aureus: NEGATIVE

## 2021-08-18 LAB — TYPE AND SCREEN
ABO/RH(D): A POS
Antibody Screen: NEGATIVE

## 2021-08-18 LAB — SARS CORONAVIRUS 2 (TAT 6-24 HRS): SARS Coronavirus 2: NEGATIVE

## 2021-08-18 LAB — PROTIME-INR
INR: 0.9 (ref 0.8–1.2)
Prothrombin Time: 12.6 seconds (ref 11.4–15.2)

## 2021-08-18 LAB — APTT: aPTT: 29 s (ref 24–36)

## 2021-08-18 NOTE — Progress Notes (Signed)
Anesthesia Chart Review:   Case: [id number] Date/Time: 08/21/21 0715   Procedure: ENDOVASCULAR AORTIC STENT GRAFT REPAIR   Anesthesia type: General   Pre-op diagnosis: AAA   Location: [facility] OR ROOM 16 / [facility]   Surgeons: Broadus John, MD       DISCUSSION: Pt is 66 years old with hx AAA, CVA, HTN, pre-diabetes  Pt to stop plavix and ASA 5 days before surgery   VS: BP (!) 142/94    Pulse 94    Temp 37 C (Oral)    Resp 17    Ht 5\' 10"  (1.778 m)    Wt 117.2 kg    SpO2 94%    BMI 37.08 kg/m   PROVIDERS: - PCP is [name], DO - Cardiology care at [facility]. Last office visit 05/19/21 with Lawerance Cruel, PA who referred pt for surgery  LABS: Labs reviewed: Acceptable for surgery. (all labs ordered are listed, but only abnormal results are displayed)  Labs Reviewed  COMPREHENSIVE METABOLIC PANEL - Abnormal; Notable for the following components:      Result Value   Glucose, Bld 132 (*)    All other components within normal limits  URINALYSIS, ROUTINE W REFLEX MICROSCOPIC - Abnormal; Notable for the following components:   Color, Urine STRAW (*)    All other components within normal limits  SURGICAL PCR SCREEN  SARS CORONAVIRUS 2 (TAT 6-24 HRS)  APTT  CBC  PROTIME-INR  TYPE AND SCREEN     IMAGES: CT angio abd/pelvis 07/14/21:  VASCULAR 1. Large infrarenal abdominal aortic aneurysm measuring 5.7 cm in maximum diameter, increased in size compared to remote abdominal CT performed in 2015, previously, 3.7 cm. If not previously performed, vascular surgery referral/consultation is recommended due to increased risk for rupture.  2. Duplicated renal arteries bilaterally. 3. Suspected hemodynamically significant narrowing involving the dominant bilateral renal arteries though this finding is without associated asymmetric renal atrophy nor delayed renal enhancement. 4.  Aortic Atherosclerosis (ICD10-I70.0).   NON-VASCULAR 1. Postsurgical change the sigmoid  colon without evidence of enteric obstruction. 2. Scattered colonic diverticulosis without evidence of superimposed acute diverticulitis. 3. Suspected hepatic steatosis with early cirrhotic change. Correlation with LFTs advised.   EKG 05/19/21: NSR nonspecific T wave abnormality lateral leads.   CV: Nuclear stress test 04/26/21:  - Abnormal ECG stress. The patient exercised for 5 minutes and 41 seconds of a Bruce protocol, achieving approximately 4.64 METs.  Reduced exercise tolerance. Hypertensive BP response. Resting EKG demonstrated normal sinus rhythm. No ST-T wave abnormalities. Peak EKG revealed 2 mm down sloping ST depression in the inferior leads persisted for 3 min into recovery.  Mild diaphragmatic attenuation noted in the inferior wall. Mild small sized ischemia in this region cannot be excluded.  - Gated SPECT imaging of the left ventricle was normal. All segments of left ventricle demonstrated normal wall motion and thickening. No stress lung uptake. TID is normal. Stress LV EF is normal 63%.  - Intermediate risk study due to abnormal EKG response, however hypertension may induce such change. No prior exam available for comparison.  Echo 04/22/21:  - Normal LV systolic function with visual EF 55-60%. Left ventricle cavity is normal in size. Normal wall thickness with basal septal hypertrophy.  - Normal global wall motion. Normal diastolic filling pattern, normal LAP.  - Trace tricuspid regurgitation. No evidence of pulmonary hypertension.  - No prior study for comparison   Past Medical History:  Diagnosis Date   AAA (abdominal aortic aneurysm)  Anxiety    Atherosclerosis of aorta (HCC)    Atrial contractions, premature    Depression    Dyslipidemia    Dyspnea    Elevated glucose 11/11/2015   Hyperlipidemia    Mild hypertension    Stroke [facility])    Urinary hesitancy     Past Surgical History:  Procedure Laterality Date   COLON RESECTION  2012    MEDICATIONS:   amLODipine (NORVASC) 10 MG tablet   atorvastatin (LIPITOR) 80 MG tablet   clopidogrel (PLAVIX) 75 MG tablet   divalproex (DEPAKOTE ER) 500 MG 24 hr tablet   LORazepam (ATIVAN) 0.5 MG tablet   losartan (COZAAR) 50 MG tablet   PARoxetine (PAXIL) 20 MG tablet   QUEtiapine (SEROQUEL) 50 MG tablet   tamsulosin (FLOMAX) 0.4 MG CAPS capsule   traZODone (DESYREL) 50 MG tablet   venlafaxine XR (EFFEXOR-XR) 75 MG 24 hr capsule   vitamin C (ASCORBIC ACID) 500 MG tablet   zinc gluconate 50 MG tablet   No current facility-administered medications for this encounter.    If no changes, I anticipate pt can proceed with surgery as scheduled.   Willeen Cass, PhD, FNP-BC [facility]/Anesthesiology Phone: [phone] 08/18/2021 4:27 PM

## 2021-08-18 NOTE — Progress Notes (Signed)
PCP: Einar Gip, DO Cardiologist: Rex Kras, MD  EKG: 04/13/21 CXR: 09/14/16 ECHO: 04/21/21 Stress Test: 04/26/21 Cardiac Cath: denies  Fasting Blood Sugar- na Checks Blood Sugar__na_ times a day  OSA/CPAP: No  ASA: No Blood Thinner: Last dose Plavix 08/16/21  Covid test 08/18/21  Anesthesia Review: Yes,  cardiac clearance 08/09/21  Patient denies shortness of breath, fever, cough, and chest pain at PAT appointment.  Patient verbalized understanding of instructions provided today at the PAT appointment.  Patient asked to review instructions at home and day of surgery.

## 2021-08-20 NOTE — Anesthesia Preprocedure Evaluation (Addendum)
Anesthesia Evaluation  Patient identified by MRN, date of birth, ID band Patient awake    Reviewed: Allergy & Precautions, NPO status , Patient's Chart, lab work & pertinent test results  History of Anesthesia Complications Negative for: history of anesthetic complications  Airway Mallampati: II  TM Distance: >3 FB Neck ROM: Full    Dental  (+) Dental Advisory Given, Teeth Intact   Pulmonary former smoker,    Pulmonary exam normal        Cardiovascular hypertension, Pt. on medications Normal cardiovascular exam   '22 Myoperfusion - Abnormal ECG stress. The patient  achieved approximately 4.64 METs.  Reduced exercise tolerance. Hypertensive BP response. Resting EKG demonstrated normal sinus rhythm. No ST-T wave abnormalities. Peak EKG revealed 2 mm down sloping ST depression in the inferior leads persisted for 3 min into recovery. Mild diaphragmatic attenuation noted in the inferior wall. Mild small sized ischemia in this region cannot be excluded. Gated SPECT imaging of the left ventricle was normal. All segments of left ventricle demonstrated normal wall motion and thickening. No stress lung uptake. TID is normal. Stress LV EF is normal 63%.  Intermediate risk study due to abnormal EKG response, however hypertension may induce such change. No prior exam available for comparison.  '22 Aortic US - Severe dilatation of the abdominal aorta is noted in the distal aorta. Moderate dilatation of the abdominal aorta is noted in the mid aorta. An abdominal aortic aneurysm measuring 4.65 x 4.7 x 5.42 cm is seen.  Mild plaque noted in the mid and distal aorta. Iliacs were not visualized due to body habitus.     Neuro/Psych PSYCHIATRIC DISORDERS Anxiety Depression  Hearing loss  CVA    GI/Hepatic negative GI ROS, Neg liver ROS,   Endo/Other   Obesity Pre-DM   Renal/GU negative Renal ROS     Musculoskeletal negative musculoskeletal  ROS (+)   Abdominal   Peds  Hematology negative hematology ROS (+)  On plavix    Anesthesia Other Findings   Reproductive/Obstetrics                            Anesthesia Physical Anesthesia Plan  ASA: 3  Anesthesia Plan: General   Post-op Pain Management: Tylenol PO (pre-op)* and Minimal or no pain anticipated   Induction: Intravenous  PONV Risk Score and Plan: 2 and Treatment may vary due to age or medical condition, Ondansetron and Dexamethasone  Airway Management Planned: Oral ETT  Additional Equipment: Arterial line  Intra-op Plan:   Post-operative Plan: Extubation in OR  Informed Consent: I have reviewed the patients History and Physical, chart, labs and discussed the procedure including the risks, benefits and alternatives for the proposed anesthesia with the patient or authorized representative who has indicated his/her understanding and acceptance.     Dental advisory given  Plan Discussed with: CRNA and Anesthesiologist  Anesthesia Plan Comments:        Anesthesia Quick Evaluation

## 2021-08-21 ENCOUNTER — Inpatient Hospital Stay (HOSPITAL_COMMUNITY): Payer: Medicare HMO

## 2021-08-21 ENCOUNTER — Inpatient Hospital Stay (HOSPITAL_COMMUNITY): Payer: Medicare HMO | Admitting: Emergency Medicine

## 2021-08-21 ENCOUNTER — Encounter (HOSPITAL_COMMUNITY)
Admission: RE | Disposition: A | Discharge status: Home / Self Care | Payer: Self-pay | Source: Home / Self Care | Attending: Vascular Surgery

## 2021-08-21 ENCOUNTER — Inpatient Hospital Stay (HOSPITAL_COMMUNITY): Payer: Medicare HMO | Admitting: Registered Nurse

## 2021-08-21 ENCOUNTER — Other Ambulatory Visit: Payer: Self-pay

## 2021-08-21 ENCOUNTER — Encounter (HOSPITAL_COMMUNITY): Payer: Medicare HMO

## 2021-08-21 ENCOUNTER — Encounter (HOSPITAL_COMMUNITY): Payer: Self-pay | Admitting: Vascular Surgery

## 2021-08-21 ENCOUNTER — Inpatient Hospital Stay (HOSPITAL_COMMUNITY)
Admission: RE | Admit: 2021-08-21 | Discharge: 2021-08-22 | DRG: 269 | Disposition: A | Discharge status: Home / Self Care | Payer: Medicare HMO | Attending: Vascular Surgery | Admitting: Vascular Surgery

## 2021-08-21 DIAGNOSIS — I10 Essential (primary) hypertension: Secondary | ICD-10-CM

## 2021-08-21 DIAGNOSIS — I714 Abdominal aortic aneurysm, without rupture, unspecified: Secondary | ICD-10-CM | POA: Diagnosis not present

## 2021-08-21 DIAGNOSIS — Z8249 Family history of ischemic heart disease and other diseases of the circulatory system: Secondary | ICD-10-CM

## 2021-08-21 DIAGNOSIS — Z8673 Personal history of transient ischemic attack (TIA), and cerebral infarction without residual deficits: Secondary | ICD-10-CM | POA: Diagnosis not present

## 2021-08-21 DIAGNOSIS — E669 Obesity, unspecified: Secondary | ICD-10-CM | POA: Diagnosis present

## 2021-08-21 DIAGNOSIS — I513 Intracardiac thrombosis, not elsewhere classified: Secondary | ICD-10-CM | POA: Diagnosis present

## 2021-08-21 DIAGNOSIS — E785 Hyperlipidemia, unspecified: Secondary | ICD-10-CM | POA: Diagnosis present

## 2021-08-21 DIAGNOSIS — Z20822 Contact with and (suspected) exposure to covid-19: Secondary | ICD-10-CM | POA: Diagnosis present

## 2021-08-21 DIAGNOSIS — Q272 Other congenital malformations of renal artery: Secondary | ICD-10-CM

## 2021-08-21 DIAGNOSIS — Z6837 Body mass index (BMI) 37.0-37.9, adult: Secondary | ICD-10-CM

## 2021-08-21 DIAGNOSIS — I2510 Atherosclerotic heart disease of native coronary artery without angina pectoris: Secondary | ICD-10-CM | POA: Diagnosis present

## 2021-08-21 DIAGNOSIS — Z79899 Other long term (current) drug therapy: Secondary | ICD-10-CM | POA: Diagnosis not present

## 2021-08-21 DIAGNOSIS — F419 Anxiety disorder, unspecified: Secondary | ICD-10-CM | POA: Diagnosis present

## 2021-08-21 DIAGNOSIS — I700 Atherosclerosis of aorta: Secondary | ICD-10-CM | POA: Diagnosis present

## 2021-08-21 DIAGNOSIS — Z818 Family history of other mental and behavioral disorders: Secondary | ICD-10-CM

## 2021-08-21 DIAGNOSIS — R0902 Hypoxemia: Secondary | ICD-10-CM

## 2021-08-21 DIAGNOSIS — I7143 Infrarenal abdominal aortic aneurysm, without rupture: Secondary | ICD-10-CM | POA: Diagnosis present

## 2021-08-21 DIAGNOSIS — Z87891 Personal history of nicotine dependence: Secondary | ICD-10-CM

## 2021-08-21 DIAGNOSIS — F32A Depression, unspecified: Secondary | ICD-10-CM | POA: Diagnosis present

## 2021-08-21 DIAGNOSIS — F418 Other specified anxiety disorders: Secondary | ICD-10-CM

## 2021-08-21 DIAGNOSIS — Z9889 Other specified postprocedural states: Secondary | ICD-10-CM

## 2021-08-21 DIAGNOSIS — Z823 Family history of stroke: Secondary | ICD-10-CM | POA: Diagnosis not present

## 2021-08-21 DIAGNOSIS — I701 Atherosclerosis of renal artery: Secondary | ICD-10-CM | POA: Diagnosis not present

## 2021-08-21 HISTORY — PX: ULTRASOUND GUIDANCE FOR VASCULAR ACCESS: SHX6516

## 2021-08-21 HISTORY — PX: ABDOMINAL AORTIC ENDOVASCULAR STENT GRAFT: SHX5707

## 2021-08-21 LAB — PROTIME-INR
INR: 1 (ref 0.8–1.2)
Prothrombin Time: 13.6 seconds (ref 11.4–15.2)

## 2021-08-21 LAB — BASIC METABOLIC PANEL
Anion gap: 9 (ref 5–15)
BUN: 10 mg/dL (ref 8–23)
CO2: 23 mmol/L (ref 22–32)
Calcium: 8.9 mg/dL (ref 8.9–10.3)
Chloride: 104 mmol/L (ref 98–111)
Creatinine, Ser: 1.03 mg/dL (ref 0.61–1.24)
GFR, Estimated: >60 mL/min (ref >60)
Glucose, Bld: 147 mg/dL — ABNORMAL HIGH (ref 70–99)
Potassium: 4.1 mmol/L (ref 3.5–5.1)
Sodium: 136 mmol/L (ref 135–145)

## 2021-08-21 LAB — APTT: aPTT: 36 seconds (ref 24–36)

## 2021-08-21 LAB — CBC
HCT: 40.7 % (ref 39.0–52.0)
Hemoglobin: 13.4 g/dL (ref 13.0–17.0)
MCH: 30.4 pg (ref 26.0–34.0)
MCHC: 32.9 g/dL (ref 30.0–36.0)
MCV: 92.3 fL (ref 80.0–100.0)
Platelets: 223 10*3/uL (ref 150–400)
RBC: 4.41 MIL/uL (ref 4.22–5.81)
RDW: 14.5 % (ref 11.5–15.5)
WBC: 7.2 10*3/uL (ref 4.0–10.5)
nRBC: 0 % (ref 0.0–0.2)

## 2021-08-21 LAB — CREATININE, SERUM
Creatinine, Ser: 0.97 mg/dL (ref 0.61–1.24)
GFR, Estimated: >60 mL/min (ref >60)

## 2021-08-21 LAB — ABO/RH: ABO/RH(D): A POS

## 2021-08-21 SURGERY — INSERTION, ENDOVASCULAR STENT GRAFT, AORTA, ABDOMINAL
Anesthesia: General | Site: Groin | Laterality: Bilateral

## 2021-08-21 MED ORDER — PROPOFOL 10 MG/ML IV BOLUS
INTRAVENOUS | Status: DC | PRN
  Administered 2021-08-21: 30 mg via INTRAVENOUS
  Administered 2021-08-21: 140 mg via INTRAVENOUS
  Administered 2021-08-21: 30 mg via INTRAVENOUS

## 2021-08-21 MED ORDER — PHENYLEPHRINE 40 MCG/ML (10ML) SYRINGE FOR IV PUSH (FOR BLOOD PRESSURE SUPPORT)
PREFILLED_SYRINGE | INTRAVENOUS | Status: AC
  Filled 2021-08-21: qty 10

## 2021-08-21 MED ORDER — DEXAMETHASONE SODIUM PHOSPHATE 10 MG/ML IJ SOLN
INTRAMUSCULAR | Status: DC | PRN
  Administered 2021-08-21: 10 mg via INTRAVENOUS

## 2021-08-21 MED ORDER — TRAZODONE HCL 50 MG PO TABS
50.0000 mg | ORAL_TABLET | Freq: Every day | ORAL | Status: DC
  Administered 2021-08-21: 50 mg via ORAL
  Filled 2021-08-21: qty 1

## 2021-08-21 MED ORDER — CHLORHEXIDINE GLUCONATE 0.12 % MT SOLN
OROMUCOSAL | Status: AC
  Administered 2021-08-21: 15 mL
  Filled 2021-08-21: qty 15

## 2021-08-21 MED ORDER — PANTOPRAZOLE SODIUM 40 MG PO TBEC
40.0000 mg | DELAYED_RELEASE_TABLET | Freq: Every day | ORAL | Status: DC
  Administered 2021-08-21 – 2021-08-22 (×2): 40 mg via ORAL
  Filled 2021-08-21 (×2): qty 1

## 2021-08-21 MED ORDER — ACETAMINOPHEN 650 MG RE SUPP: 325.0000 mg | RECTAL | Status: DC | PRN

## 2021-08-21 MED ORDER — LABETALOL HCL 5 MG/ML IV SOLN: 10.0000 mg | INTRAVENOUS | Status: DC | PRN

## 2021-08-21 MED ORDER — HEPARIN 6000 UNIT IRRIGATION SOLUTION
Status: AC
  Filled 2021-08-21: qty 500

## 2021-08-21 MED ORDER — PHENYLEPHRINE HCL-NACL 20-0.9 MG/250ML-% IV SOLN
INTRAVENOUS | Status: DC | PRN
  Administered 2021-08-21: 30 ug/min via INTRAVENOUS

## 2021-08-21 MED ORDER — PROTAMINE SULFATE 10 MG/ML IV SOLN
INTRAVENOUS | Status: DC | PRN
  Administered 2021-08-21: 45 mg via INTRAVENOUS
  Administered 2021-08-21: 5 mg via INTRAVENOUS

## 2021-08-21 MED ORDER — OXYCODONE HCL 5 MG PO TABS: 5.0000 mg | ORAL_TABLET | Freq: Once | ORAL | Status: DC | PRN

## 2021-08-21 MED ORDER — CHLORHEXIDINE GLUCONATE CLOTH 2 % EX PADS: 6.0000 | MEDICATED_PAD | Freq: Once | CUTANEOUS | Status: DC

## 2021-08-21 MED ORDER — HYDRALAZINE HCL 20 MG/ML IJ SOLN: 5.0000 mg | INTRAMUSCULAR | Status: DC | PRN

## 2021-08-21 MED ORDER — HEPARIN 6000 UNIT IRRIGATION SOLUTION
Status: DC | PRN
Start: 2021-08-21 — End: 2021-08-21
  Administered 2021-08-21: 1

## 2021-08-21 MED ORDER — MIDAZOLAM HCL 2 MG/2ML IJ SOLN
INTRAMUSCULAR | Status: AC
  Filled 2021-08-21: qty 2

## 2021-08-21 MED ORDER — ACETAMINOPHEN 325 MG PO TABS: 325.0000 mg | ORAL_TABLET | ORAL | Status: DC | PRN

## 2021-08-21 MED ORDER — VENLAFAXINE HCL ER 75 MG PO CP24
75.0000 mg | ORAL_CAPSULE | Freq: Every day | ORAL | Status: DC
  Administered 2021-08-22: 75 mg via ORAL
  Filled 2021-08-21: qty 1

## 2021-08-21 MED ORDER — CEFAZOLIN SODIUM-DEXTROSE 2-4 GM/100ML-% IV SOLN
2.0000 g | INTRAVENOUS | Status: AC
  Administered 2021-08-21: 2 g via INTRAVENOUS
  Filled 2021-08-21: qty 100

## 2021-08-21 MED ORDER — PROPOFOL 10 MG/ML IV BOLUS
INTRAVENOUS | Status: AC
  Filled 2021-08-21: qty 20

## 2021-08-21 MED ORDER — METOPROLOL TARTRATE 5 MG/5ML IV SOLN
2.0000 mg | INTRAVENOUS | Status: DC | PRN
  Administered 2021-08-21: 5 mg via INTRAVENOUS
  Filled 2021-08-21: qty 5

## 2021-08-21 MED ORDER — DOCUSATE SODIUM 100 MG PO CAPS
100.0000 mg | ORAL_CAPSULE | Freq: Every day | ORAL | Status: DC
  Administered 2021-08-22: 100 mg via ORAL
  Filled 2021-08-21: qty 1

## 2021-08-21 MED ORDER — HEPARIN SODIUM (PORCINE) 5000 UNIT/ML IJ SOLN
5000.0000 [IU] | Freq: Three times a day (TID) | INTRAMUSCULAR | Status: DC
  Administered 2021-08-22: 5000 [IU] via SUBCUTANEOUS
  Filled 2021-08-21: qty 1

## 2021-08-21 MED ORDER — LACTATED RINGERS IV SOLN: INTRAVENOUS | Status: DC | PRN

## 2021-08-21 MED ORDER — LIDOCAINE 2% (20 MG/ML) 5 ML SYRINGE
INTRAMUSCULAR | Status: DC | PRN
  Administered 2021-08-21: 60 mg via INTRAVENOUS

## 2021-08-21 MED ORDER — FENTANYL CITRATE (PF) 250 MCG/5ML IJ SOLN
INTRAMUSCULAR | Status: DC | PRN
Start: 2021-08-21 — End: 2021-08-21
  Administered 2021-08-21 (×4): 50 ug via INTRAVENOUS

## 2021-08-21 MED ORDER — TAMSULOSIN HCL 0.4 MG PO CAPS
0.4000 mg | ORAL_CAPSULE | Freq: Two times a day (BID) | ORAL | Status: DC
Start: 2021-08-21 — End: 2021-08-22
  Administered 2021-08-21 – 2021-08-22 (×2): 0.4 mg via ORAL
  Filled 2021-08-21 (×2): qty 1

## 2021-08-21 MED ORDER — SENNOSIDES-DOCUSATE SODIUM 8.6-50 MG PO TABS: 1.0000 | ORAL_TABLET | Freq: Every evening | ORAL | Status: DC | PRN

## 2021-08-21 MED ORDER — PHENOL 1.4 % MT LIQD: 1.0000 | OROMUCOSAL | Status: DC | PRN

## 2021-08-21 MED ORDER — OXYCODONE HCL 5 MG/5ML PO SOLN: 5.0000 mg | Freq: Once | ORAL | Status: DC | PRN

## 2021-08-21 MED ORDER — EPHEDRINE SULFATE-NACL 50-0.9 MG/10ML-% IV SOSY
PREFILLED_SYRINGE | INTRAVENOUS | Status: DC | PRN
  Administered 2021-08-21 (×2): 5 mg via INTRAVENOUS

## 2021-08-21 MED ORDER — ONDANSETRON HCL 4 MG/2ML IJ SOLN
INTRAMUSCULAR | Status: DC | PRN
  Administered 2021-08-21: 4 mg via INTRAVENOUS

## 2021-08-21 MED ORDER — AMLODIPINE BESYLATE 10 MG PO TABS
10.0000 mg | ORAL_TABLET | Freq: Every day | ORAL | Status: DC
  Administered 2021-08-22: 10 mg via ORAL
  Filled 2021-08-21: qty 1

## 2021-08-21 MED ORDER — BISACODYL 5 MG PO TBEC: 5.0000 mg | DELAYED_RELEASE_TABLET | Freq: Every day | ORAL | Status: DC | PRN

## 2021-08-21 MED ORDER — QUETIAPINE FUMARATE 25 MG PO TABS
50.0000 mg | ORAL_TABLET | Freq: Every day | ORAL | Status: DC
Start: 2021-08-21 — End: 2021-08-22
  Administered 2021-08-21: 50 mg via ORAL
  Filled 2021-08-21: qty 2

## 2021-08-21 MED ORDER — ONDANSETRON HCL 4 MG/2ML IJ SOLN: 4.0000 mg | Freq: Four times a day (QID) | INTRAMUSCULAR | Status: DC | PRN

## 2021-08-21 MED ORDER — PAROXETINE HCL 20 MG PO TABS
20.0000 mg | ORAL_TABLET | Freq: Every day | ORAL | Status: DC
Start: 2021-08-21 — End: 2021-08-22
  Administered 2021-08-21: 20 mg via ORAL
  Filled 2021-08-21: qty 1

## 2021-08-21 MED ORDER — HYDROMORPHONE HCL 1 MG/ML IJ SOLN
0.5000 mg | INTRAMUSCULAR | Status: DC | PRN
  Administered 2021-08-21 (×2): 1 mg via INTRAVENOUS
  Filled 2021-08-21 (×2): qty 1

## 2021-08-21 MED ORDER — ONDANSETRON HCL 4 MG/2ML IJ SOLN: 4.0000 mg | Freq: Once | INTRAMUSCULAR | Status: DC | PRN

## 2021-08-21 MED ORDER — ALUM & MAG HYDROXIDE-SIMETH 200-200-20 MG/5ML PO SUSP: 15.0000 mL | ORAL | Status: DC | PRN

## 2021-08-21 MED ORDER — ACETAMINOPHEN 500 MG PO TABS
1000.0000 mg | ORAL_TABLET | Freq: Once | ORAL | Status: AC
  Administered 2021-08-21: 1000 mg via ORAL
  Filled 2021-08-21: qty 2

## 2021-08-21 MED ORDER — SODIUM CHLORIDE 0.9 % IV SOLN: INTRAVENOUS | Status: DC

## 2021-08-21 MED ORDER — HEPARIN SODIUM (PORCINE) 1000 UNIT/ML IJ SOLN
INTRAMUSCULAR | Status: DC | PRN
  Administered 2021-08-21: 10000 [IU] via INTRAVENOUS
  Administered 2021-08-21: 3000 [IU] via INTRAVENOUS

## 2021-08-21 MED ORDER — LOSARTAN POTASSIUM 50 MG PO TABS
50.0000 mg | ORAL_TABLET | Freq: Every day | ORAL | Status: DC
  Administered 2021-08-21 – 2021-08-22 (×2): 50 mg via ORAL
  Filled 2021-08-21 (×2): qty 1

## 2021-08-21 MED ORDER — DIVALPROEX SODIUM ER 500 MG PO TB24
500.0000 mg | ORAL_TABLET | Freq: Every day | ORAL | Status: DC
  Administered 2021-08-21: 500 mg via ORAL
  Filled 2021-08-21 (×2): qty 1

## 2021-08-21 MED ORDER — POTASSIUM CHLORIDE CRYS ER 20 MEQ PO TBCR: 20.0000 meq | EXTENDED_RELEASE_TABLET | Freq: Every day | ORAL | Status: DC | PRN

## 2021-08-21 MED ORDER — IODIXANOL 320 MG/ML IV SOLN
INTRAVENOUS | Status: DC | PRN
  Administered 2021-08-21: 103 mL via INTRA_ARTERIAL

## 2021-08-21 MED ORDER — ASPIRIN EC 81 MG PO TBEC
81.0000 mg | DELAYED_RELEASE_TABLET | Freq: Every day | ORAL | Status: DC
  Administered 2021-08-22: 81 mg via ORAL
  Filled 2021-08-21: qty 1

## 2021-08-21 MED ORDER — PHENYLEPHRINE HCL (PRESSORS) 10 MG/ML IV SOLN
INTRAVENOUS | Status: AC
  Filled 2021-08-21: qty 1

## 2021-08-21 MED ORDER — FENTANYL CITRATE (PF) 100 MCG/2ML IJ SOLN: 25.0000 ug | INTRAMUSCULAR | Status: DC | PRN

## 2021-08-21 MED ORDER — CEFAZOLIN SODIUM-DEXTROSE 2-4 GM/100ML-% IV SOLN
2.0000 g | Freq: Three times a day (TID) | INTRAVENOUS | Status: AC
  Administered 2021-08-21 (×2): 2 g via INTRAVENOUS
  Filled 2021-08-21 (×2): qty 100

## 2021-08-21 MED ORDER — SODIUM CHLORIDE 0.9 % IV SOLN: 500.0000 mL | Freq: Once | INTRAVENOUS | Status: DC | PRN

## 2021-08-21 MED ORDER — OXYCODONE-ACETAMINOPHEN 5-325 MG PO TABS
1.0000 | ORAL_TABLET | ORAL | Status: DC | PRN
  Administered 2021-08-21: 1 via ORAL
  Filled 2021-08-21: qty 1

## 2021-08-21 MED ORDER — MAGNESIUM SULFATE 2 GM/50ML IV SOLN: 2.0000 g | Freq: Every day | INTRAVENOUS | Status: DC | PRN

## 2021-08-21 MED ORDER — ROCURONIUM BROMIDE 10 MG/ML (PF) SYRINGE
PREFILLED_SYRINGE | INTRAVENOUS | Status: DC | PRN
  Administered 2021-08-21: 30 mg via INTRAVENOUS
  Administered 2021-08-21: 50 mg via INTRAVENOUS

## 2021-08-21 MED ORDER — CLOPIDOGREL BISULFATE 75 MG PO TABS
75.0000 mg | ORAL_TABLET | Freq: Every day | ORAL | Status: DC
  Administered 2021-08-22: 75 mg via ORAL
  Filled 2021-08-21: qty 1

## 2021-08-21 MED ORDER — SUGAMMADEX SODIUM 200 MG/2ML IV SOLN
INTRAVENOUS | Status: DC | PRN
  Administered 2021-08-21: 400 mg via INTRAVENOUS

## 2021-08-21 MED ORDER — 0.9 % SODIUM CHLORIDE (POUR BTL) OPTIME
TOPICAL | Status: DC | PRN
  Administered 2021-08-21: 1000 mL

## 2021-08-21 MED ORDER — GUAIFENESIN-DM 100-10 MG/5ML PO SYRP: 15.0000 mL | ORAL_SOLUTION | ORAL | Status: DC | PRN

## 2021-08-21 MED ORDER — ATORVASTATIN CALCIUM 80 MG PO TABS
80.0000 mg | ORAL_TABLET | Freq: Every day | ORAL | Status: DC
  Administered 2021-08-22: 80 mg via ORAL
  Filled 2021-08-21: qty 1

## 2021-08-21 MED ORDER — PHENYLEPHRINE 40 MCG/ML (10ML) SYRINGE FOR IV PUSH (FOR BLOOD PRESSURE SUPPORT)
PREFILLED_SYRINGE | INTRAVENOUS | Status: DC | PRN
  Administered 2021-08-21 (×4): 80 ug via INTRAVENOUS

## 2021-08-21 MED ORDER — FENTANYL CITRATE (PF) 250 MCG/5ML IJ SOLN
INTRAMUSCULAR | Status: AC
  Filled 2021-08-21: qty 5

## 2021-08-21 MED ORDER — LORAZEPAM 1 MG PO TABS
0.5000 mg | ORAL_TABLET | Freq: Three times a day (TID) | ORAL | Status: DC
  Administered 2021-08-21 – 2021-08-22 (×3): 0.5 mg via ORAL
  Filled 2021-08-21 (×3): qty 1

## 2021-08-21 MED ORDER — EPHEDRINE SULFATE (PRESSORS) 50 MG/ML IJ SOLN: INTRAMUSCULAR | Status: DC | PRN

## 2021-08-21 SURGICAL SUPPLY — 52 items
ADH SKN CLS APL DERMABOND .7 (GAUZE/BANDAGES/DRESSINGS) ×1
BAG COUNTER SPONGE SURGICOUNT (BAG) ×2 IMPLANT
BAG SPNG CNTER NS LX DISP (BAG) ×1
CANISTER SUCT 3000ML PPV (MISCELLANEOUS) ×2 IMPLANT
CATH BEACON 5.038 65CM KMP-01 (CATHETERS) ×1 IMPLANT
CATH OMNI FLUSH .035X70CM (CATHETERS) ×1 IMPLANT
CLIP LIGATING EXTRA MED SLVR (CLIP) ×1 IMPLANT
CLIP LIGATING EXTRA SM BLUE (MISCELLANEOUS) ×1 IMPLANT
DERMABOND ADVANCED (GAUZE/BANDAGES/DRESSINGS) ×1
DERMABOND ADVANCED .7 DNX12 (GAUZE/BANDAGES/DRESSINGS) ×1 IMPLANT
DEVICE CLOSURE PERCLS PRGLD 6F (VASCULAR PRODUCTS) IMPLANT
DRSG TEGADERM 2-3/8X2-3/4 SM (GAUZE/BANDAGES/DRESSINGS) ×4 IMPLANT
DRYSEAL FLEXSHEATH 12FR 33CM (SHEATH) ×1
DRYSEAL FLEXSHEATH 18FR 33CM (SHEATH) ×1
ELECT REM PT RETURN 9FT ADLT (ELECTROSURGICAL) ×2
ELECTRODE REM PT RTRN 9FT ADLT (ELECTROSURGICAL) ×2 IMPLANT
EXCLDR TRNK 28.5X14.5X12 16F (Endovascular Graft) ×2 IMPLANT
EXCLUDER TNK 28.5X14.5X12 16F (Endovascular Graft) IMPLANT
GAUZE SPONGE 2X2 8PLY STRL LF (GAUZE/BANDAGES/DRESSINGS) ×1 IMPLANT
GLIDEWIRE ADV .035X260CM (WIRE) ×3 IMPLANT
GLOVE SRG 8 PF TXTR STRL LF DI (GLOVE) ×2 IMPLANT
GLOVE SURG POLYISO LF SZ8 (GLOVE) ×1 IMPLANT
GLOVE SURG UNDER POLY LF SZ8 (GLOVE) ×4
GOWN STRL REUS W/ TWL LRG LVL3 (GOWN DISPOSABLE) ×3 IMPLANT
GOWN STRL REUS W/TWL 2XL LVL3 (GOWN DISPOSABLE) ×2 IMPLANT
GOWN STRL REUS W/TWL LRG LVL3 (GOWN DISPOSABLE) ×6
GRAFT BALLN CATH 65CM (STENTS) IMPLANT
KIT BASIN OR (CUSTOM PROCEDURE TRAY) ×2 IMPLANT
KIT TURNOVER KIT B (KITS) ×2 IMPLANT
LEG CONTRALATERAL 16X16X13.5 (Endovascular Graft) ×2 IMPLANT
LEG CONTRALETERAL16X16X11.5 (Endovascular Graft) ×2 IMPLANT
NS IRRIG 1000ML POUR BTL (IV SOLUTION) ×2 IMPLANT
PACK ENDOVASCULAR (PACKS) ×2 IMPLANT
PAD ARMBOARD 7.5X6 YLW CONV (MISCELLANEOUS) ×4 IMPLANT
PERCLOSE PROGLIDE 6F (VASCULAR PRODUCTS) ×8
SET MICROPUNCTURE 5F STIFF (MISCELLANEOUS) ×2 IMPLANT
SHEATH BRITE TIP 8FR 23CM (SHEATH) ×1 IMPLANT
SHEATH DRYSEAL FLEX 12FR 33CM (SHEATH) IMPLANT
SHEATH DRYSEAL FLEX 18FR 33CM (SHEATH) IMPLANT
SHEATH PINNACLE 8F 10CM (SHEATH) ×1 IMPLANT
SPONGE GAUZE 2X2 STER 10/PKG (GAUZE/BANDAGES/DRESSINGS) ×1
STENT GRAFT BALLN CATH 65CM (STENTS) ×2
STENT GRAFT CONTRALAT 16X11.5 (Endovascular Graft) IMPLANT
STENT GRAFT CONTRALAT 16X13.5 (Endovascular Graft) IMPLANT
STOPCOCK MORSE 400PSI 3WAY (MISCELLANEOUS) ×2 IMPLANT
SUT MNCRL AB 4-0 PS2 18 (SUTURE) ×3 IMPLANT
TOWEL GREEN STERILE (TOWEL DISPOSABLE) ×2 IMPLANT
TRAY FOLEY MTR SLVR 16FR STAT (SET/KITS/TRAYS/PACK) ×2 IMPLANT
TUBING HIGH PRESSURE 120CM (CONNECTOR) ×2 IMPLANT
WIRE AMPLATZ SS-J .035X180CM (WIRE) ×2 IMPLANT
WIRE BENTSON .035X145CM (WIRE) ×2 IMPLANT
WIRE STARTER BENTSON 035X150 (WIRE) ×1 IMPLANT

## 2021-08-21 NOTE — Op Note (Addendum)
NAME: Lawrence Bennett    MRN: [id number] DOB: 05-05-1956    DATE OF OPERATION: 08/21/2021  PREOP DIAGNOSIS:    5.7 cm asymptomatic infrarenal abdominal aortic aneurysm  POSTOP DIAGNOSIS:    Same  PROCEDURE:    Ultrasound guided micropuncture access of bilateral common femoral arteries Endovascular aortic repair using Gore C3 conformable device  Devices  From the right: 20 x 14 x 12 cm main body, 16 x 11.5 cm limb extension From the left: 16 x 13.5 cm contralateral limb  SURGEON: Broadus John  ASSIST: Paulo Fruit, PA  ANESTHESIA: General  EBL: 25 mL  INDICATIONS:   Lawrence Bennett is a 66 y.o. male presenting with 5.7 cm asymptomatic abdominal aortic aneurysm.  This meets criteria for elective repair.   I had a long discussion with Lawrence Bennett regarding the above, including the risks and benefits of continued medical management versus surgery. Lawrence Bennett is not a good open surgical candidate due to obesity and CAD, and would be best served with endovascular aortic repair. 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  In order to seal the aneurysm, I will likely have to place the stent graft above the left accessory renal which has a possibility of affecting his kidney function as it appears the left accessory renal perfuses the inferior pole of the left kidney. I also discussed the risk of aortic rupture, access complications, immediate and delayed endoleak's. After discussing the risks and benefits of endovascular aortic repair, Lawrence Bennett elected to proceed.  FINDINGS:   Bilateral accessory renal arteries Left-sided excess renal artery covered by the endograft Graft abutting the main right renal artery  TECHNIQUE:   The patient was brought to the operating room after informed consent was obtained. After placement of IV's  and arterial line general anesthesia was successfully induced. The patient was prepped and draped in the usual sterile fashion. Peri-operative antibiotics were given. A time-out was performed.   Bilateral percutaneous access of the CFA was obtained with ultrasound guidance. 2 perclose devices were placed in the pre-close technique and the arteriotomy was up-sized to a 7 french sheath over GWA wires. The patient was heparinized with 10000 units of IV heparin. A pigtale catheter was advanced into the aorta and an aortogram was obtained. This demonstrated patent renal arteries bilaterally with bilateral accessory renal arteries and a sufficient aortic neck. Bilateral hypogastric arteries were patent. Length measurements from pre-op CT scan were confirmed. The right side sheath was up-sized to an 18 french dry seal sheath over a Glide Advantage wire. A 67mmx 45mmx 12cm GORE c3 conformable main body was positioned in the infrarenal aorta. Another aortogram was performed under magnification to define the renal artery origins.   The graft was deployed. The contralateral limb was selected with a vertebral catheter and soft glide wire. Once advanced into the graft and wire withdrawn the vertebral catheter was freely moving. An arteriogram was performed and documented the graft was in a good infrarenal location. Bilateral renal arteries were patent. This was double confirmation that we had selected the contralateral limb. A 12 french sheath was then advanced into the contralateral limb over a Glide Advantage wire. A  16xmm x 13.5cm limb was inserted on the left side and deployed per the IFU. The reminder of the main body was deployed, followed by right limb extender using a 21mm x11.5cm limb.  A compliant  balloon was used to dilate the main body proximal and distal seal zones as well as the contralateral overlap with the main body and the distal seal zone. A completion aortogram was obtained which revealed patent  bilateral renal arteries. The accessory left renal artery was purposefully covered by the endograft. The hypogastric arteries were patent. EVAR with no evidence of type I A, IB, or type III endoleak.   I was concerned there was partial coverage of the right renal artery, and therefore paralax was corrected followed by another aortogram with focus on the renal arteries. The right renal was patent and appeared to have an ostia more proximal. There was excellent filling. I elected not to stent the vessel due to the above. I was happy with the result. The arteriotomies were closed over a wire using the previously placed perc-close devices with excellent hemostasis, prior to complete closure bilateral femoral arteriograms were obtained via a micropuncture sheath demonstrating adequate access within the common femoral arteries with no access site complications. Bilateral femoral angiogram showed patent femoral bifurcations without access complication. Pedal pulses were checked and found to be no different from pre-op. 50 mg of protamine was given. Surgicel was packed into the sheath tracks. The skin was closed with 4-0 monocryl suture. Dry sterile dressing was placed. General anesthesia was successfully terminated. The patient was transported to the recovery room in stable position.  I will follow renal function due to planned left accessory renal artery coverage. and imaging demonstrating right renal artery encroachment from the stent.  Macie Burows, MD [facility]  DATE OF DICTATION:   08/21/2021

## 2021-08-21 NOTE — Anesthesia Procedure Notes (Signed)
Procedure Name: Intubation Date/Time: 08/21/2021 7:45 AM Performed by: Griffin Dakin, CRNA Pre-anesthesia Checklist: Patient identified, Emergency Drugs available, Suction available and Patient being monitored Patient Re-evaluated:Patient Re-evaluated prior to induction Oxygen Delivery Method: Circle system utilized Preoxygenation: Pre-oxygenation with 100% oxygen Induction Type: IV induction Ventilation: Oral airway inserted - appropriate to patient size and Two handed mask ventilation required Laryngoscope Size: 4 and Glidescope Grade View: Grade I Tube type: Oral Tube size: 7.5 mm Number of attempts: 1 Airway Equipment and Method: Stylet and Oral airway Placement Confirmation: ETT inserted through vocal cords under direct vision, positive ETCO2 and breath sounds checked- equal and bilateral Secured at: 22 cm Tube secured with: Tape Dental Injury: Teeth and Oropharynx as per pre-operative assessment

## 2021-08-21 NOTE — Progress Notes (Signed)
Pt arrived from PACU to 4E10 after AAA repair with Dr. Virl Cagey.  Telemetry monitor applied and CCMD notified. Rt and Lft groins sites level 0.  Patient is INAD.  CHG bath and skin assessment completed.  Patient oriented to unit and room to include call light and phone.  All needs addressed.  Call light within reach.

## 2021-08-21 NOTE — Transfer of Care (Signed)
Immediate Anesthesia Transfer of Care Note  Patient: Lawrence Bennett  Procedure(s) Performed: ENDOVASCULAR AORTIC STENT GRAFT REPAIR (Bilateral: Groin) ULTRASOUND GUIDANCE FOR VASCULAR ACCESS (Bilateral: Groin)  Patient Location: PACU  Anesthesia Type:General  Level of Consciousness: awake, alert  and oriented  Airway & Oxygen Therapy: Patient Spontanous Breathing and Patient connected to face mask oxygen  Post-op Assessment: Report given to RN and Post -op Vital signs reviewed and stable  Post vital signs: Reviewed and stable  Last Vitals:  Vitals Value Taken Time  BP 118/73 08/21/21 0957  Temp    Pulse 90 08/21/21 1000  Resp 15 08/21/21 1000  SpO2 94 % 08/21/21 1000  Vitals shown include unvalidated device data.  Last Pain:  Vitals:   08/21/21 0606  TempSrc: Oral  PainSc: 0-No pain         Complications: No notable events documented.

## 2021-08-21 NOTE — Telephone Encounter (Signed)
Encounter opened in error

## 2021-08-21 NOTE — Anesthesia Postprocedure Evaluation (Signed)
Anesthesia Post Note  Patient: Lawrence Bennett  Procedure(s) Performed: ENDOVASCULAR AORTIC STENT GRAFT REPAIR (Bilateral: Groin) ULTRASOUND GUIDANCE FOR VASCULAR ACCESS (Bilateral: Groin)     Patient location during evaluation: PACU Anesthesia Type: General Level of consciousness: awake and alert Pain management: pain level controlled Vital Signs Assessment: post-procedure vital signs reviewed and stable Respiratory status: spontaneous breathing, nonlabored ventilation, respiratory function stable and patient connected to nasal cannula oxygen Cardiovascular status: blood pressure returned to baseline and stable Postop Assessment: no apparent nausea or vomiting Anesthetic complications: no   No notable events documented.  Last Vitals:  Vitals:   08/21/21 1111 08/21/21 1131  BP: 126/73 122/74  Pulse: 82 81  Resp: 18 16  Temp: 36.7 C   SpO2: 93% 90%    Last Pain:  Vitals:   08/21/21 1111  TempSrc: Oral  PainSc: [place]

## 2021-08-21 NOTE — H&P (Signed)
Office Note   Patient seen and examined in preop holding.  No complaints. No changes to medication history or physical exam since last seen in clinic. Patient with a 5.7 cm infrarenal abdominal aortic aneurysm.  We had lengthy discussions in clinic regarding this repair as well as the risks and benefits.  I am most concerned that Lawrence Bennett has a short aortic neck, as well as bilateral accessory renal arteries.  The device will cover the small left accessory renal artery.  We discussed that coverage could result in kidney damage and dysfunction. After discussing all of the risks and benefits of endovascular aortic repair, Lawrence Bennett elected to proceed.   Lawrence John MD   CC:  AAA Requesting Provider:  No ref. provider found  HPI: Lawrence Bennett is a 66 y.o. (February 19, 1956) male presenting in follow-up after recent abdominal duplex ultrasound demonstrated 5.4 cm abdominal aortic aneurysm.  He presents today accompanied by his wife.  Since his last visit, Lawrence Bennett underwent CT angiography of his abdomen and pelvis to further define his infrarenal abdominal aorta.  He has had no new symptoms, and denied history of back pain, belly pain, chest pain-outside of his chronic back pain that has been present for quite some time.  At her last visit, we discussed working with physical therapy, but he elected to hold off until his aneurysm was repaired.  Lawrence Bennett's mother had an abdominal aortic aneurysm. No history of vascular surgeries  The pt is  on a statin for cholesterol management.  The pt is not on a daily aspirin.   Other AC:  plavix The pt is on on medication for hypertension.   The pt is not diabetic.  Tobacco hx:  none  Past Medical History:  Diagnosis Date   AAA (abdominal aortic aneurysm)    Anxiety    Atherosclerosis of aorta (HCC)    Atrial contractions, premature    Depression    Dyslipidemia    Dyspnea    Elevated glucose 11/11/2015   Hyperlipidemia    Mild hypertension     Stroke Lawrence Bennett)    Urinary hesitancy     Past Surgical History:  Procedure Laterality Date   COLON RESECTION  2012    Social History   Socioeconomic History   Marital status: Married    Spouse name: Not on file   Number of children: 3   Years of education: Not on file   Highest education level: Not on file  Occupational History   Occupation: retired  Tobacco Use   Smoking status: Former    Packs/day: 0.20    Years: 19.00    Pack years: 3.80    Types: Cigarettes   Smokeless tobacco: Never  Vaping Use   Vaping Use: Never used  Substance and Sexual Activity   Alcohol use: No    Alcohol/week: 0.0 standard drinks   Drug use: No   Sexual activity: Not Currently  Other Topics Concern   Not on file  Social History Narrative   Right handed   Lives with wife   Social Determinants of Health   Financial Resource Strain: Low Risk    Difficulty of Paying Living Expenses: Not hard at all  Food Insecurity: No Food Insecurity   Worried About Charity fundraiser in the Last Year: Never true   Ran Out of Food in the Last Year: Never true  Transportation Needs: No Transportation Needs   Lack of Transportation (Medical): No   Lack of Transportation (Non-Medical):  No  Physical Activity: Inactive   Days of Exercise per Week: 0 days   Minutes of Exercise per Session: 0 min  Stress: No Stress Concern Present   Feeling of Stress : Only a little  Social Connections: Engineer, building services of Communication with Friends and Family: More than three times a week   Frequency of Social Gatherings with Friends and Family: More than three times a week   Attends Religious Services: 1 to 4 times per year   Active Member of [employer] or Organizations: Yes   Attends Archivist Meetings: 1 to 4 times per year   Marital Status: Married  Human resources officer Violence: Not At Risk   Fear of Current or Ex-Partner: No   Emotionally Abused: No   Physically Abused: No   Sexually Abused: No     Family History  Problem Relation Age of Onset   COPD Mother    Stroke Father    Bipolar disorder Father    Hypertension Sister    Depression Sister    Colon cancer Neg Hx    Esophageal cancer Neg Hx    Inflammatory bowel disease Neg Hx    Liver disease Neg Hx    Rectal cancer Neg Hx    Pancreatic cancer Neg Hx    Stomach cancer Neg Hx     Current Facility-Administered Medications  Medication Dose Route Frequency Provider Last Rate Last Admin   0.9 %  sodium chloride infusion   Intravenous Continuous Lawrence John, MD       ceFAZolin (ANCEF) IVPB 2g/100 mL premix  2 g Intravenous 30 min Pre-Op Lawrence John, MD       Chlorhexidine Gluconate Cloth 2 % PADS 6 each  6 each Topical Once Lawrence John, MD       And   Chlorhexidine Gluconate Cloth 2 % PADS 6 each  6 each Topical Once Lawrence John, MD        No Known Allergies   REVIEW OF SYSTEMS:   [X]  denotes positive finding, [ ]  denotes negative finding Cardiac  Comments:  Chest pain or chest pressure:    Shortness of breath upon exertion:    Short of breath when lying flat:    Irregular heart rhythm:        Vascular    Pain in calf, thigh, or hip brought on by ambulation:    Pain in feet at night that wakes you up from your sleep:     Blood clot in your veins:    Leg swelling:         Pulmonary    Oxygen at home:    Productive cough:     Wheezing:         Neurologic    Sudden weakness in arms or legs:     Sudden numbness in arms or legs:     Sudden onset of difficulty speaking or slurred speech:    Temporary loss of vision in one eye:     Problems with dizziness:         Gastrointestinal    Blood in stool:     Vomited blood:         Genitourinary    Burning when urinating:     Blood in urine:        Psychiatric    Major depression:         Hematologic    Bleeding problems:    Problems with blood  clotting too easily:        Skin    Rashes or ulcers:        Constitutional     Fever or chills:      PHYSICAL EXAMINATION:  Vitals:   08/21/21 0606 08/21/21 0607  BP: 138/87   Pulse: 81   Resp: 17   Temp:  98.2 F (36.8 C)  TempSrc: Oral   SpO2: 92%   Weight: 117.2 kg   Height: 5\' 10"  (1.778 m)     General:  WDWN in NAD; vital signs documented above Gait: Not observed HENT: WNL, normocephalic Pulmonary: normal non-labored breathing , without wheezing Cardiac: regular HR,  Abdomen: soft, NT, no masses Skin: without rashes Vascular Exam/Pulses:  Right Left  Radial 2+ (normal) 2+ (normal)  Ulnar 2+ (normal) 2+ (normal)  Femoral 2+ (normal) 2+ (normal)  Popliteal trace trace  DP 2+ (normal) 2+ (normal)  PT 2+ (normal) 2+ (normal)   Extremities: without ischemic changes, without Gangrene , without cellulitis; without open wounds;  Musculoskeletal: no muscle wasting or atrophy  Neurologic: A&O X 3;  No focal weakness or paresthesias are detected Psychiatric:  The pt has Normal affect.   Non-Invasive Vascular Imaging:    07/14/2021 CT angio abdomen pelvis was independently reviewed demonstrating an infrarenal abdominal aortic aneurysm measuring 5.7 cm in greatest diameter.  Patient has bilateral accessory renal arteries.  No popliteal artery aneurysm appreciated.    ASSESSMENT/PLAN: Lawrence Bennett is a 65 y.o. male presenting with 5.7 cm asymptomatic abdominal aortic aneurysm.  This meets criteria for elective repair.  I had a long discussion with Lawrence Bennett regarding the above, including the risks and benefits of continued medical management versus surgery. Lawrence Bennett is not a good open surgical candidate due to obesity, and would be best served with endovascular aortic repair. 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  In order to seal the aneurysm, I will likely have to place the stent graft above  the left accessory renal which has a possibility of affecting his kidney function as it appears the left accessory renal perfuses the inferior pole of the left kidney. I also discussed the risk of aortic rupture, access complications, immediate and delayed endoleak's. After discussing the risks and benefits of endovascular aortic repair, Lawrence Bennett elected to proceed.  Although the patient underwent stress testing and October, I asked that he see his cardiology for preoperative risk assessment prior to scheduling repair. As discussed at his last visit, I asked that he seek immediate medical attention should new onset, severe chest, back, abdominal pain occur.   Lawrence John, MD [facility] [phone]

## 2021-08-21 NOTE — Discharge Instructions (Signed)
  [facility] [place]   Discharge Instructions  Endovascular Aortic Aneurysm Repair  Please refer to the following instructions for your post-procedure care. Your surgeon or Physician Assistant will discuss any changes with you.  Activity  You are encouraged to walk as much as you can. You can slowly return to normal activities but must avoid strenuous activity and heavy lifting until your doctor tells you it's OK. Avoid activities such as vacuuming or swinging a gold club. It is normal to feel tired for several weeks after your surgery. Do not drive until your doctor gives the OK and you are no longer taking prescription pain medications. It is also normal to have difficulty with sleep habits, eating, and bowel movements after surgery. These will go away with time.  Bathing/Showering  Shower daily after you go home.  Do not soak in a bathtub, hot tub, or swim until the incision heals completely.  If you have incisions in your groin, wash the groin wounds with soap and water daily and pat dry. (No tub bath-only shower)  Then put a dry gauze or washcloth there to keep this area dry to help prevent wound infection daily and as needed.  Do not use Vaseline or neosporin on your incisions.  Only use soap and water on your incisions and then protect and keep dry.  Incision Care  Shower every day. Clean your incision with mild soap and water. Pat the area dry with a clean towel. You do not need a bandage unless otherwise instructed. Do not apply any ointments or creams to your incision. If you clothing is irritating, you may cover your incision with a dry gauze pad.  Diet  Resume your normal diet. There are no special food restrictions following this procedure. A low fat/low cholesterol diet is recommended for all patients with vascular disease. In order to heal from your surgery, it is CRITICAL to get adequate nutrition. Your body requires vitamins, minerals, and protein.  Vegetables are the best source of vitamins and minerals. Vegetables also provide the perfect balance of protein. Processed food has little nutritional value, so try to avoid this.  Medications  Resume taking all of your medications unless your doctor or nurse practitioner tells you not to. If your incision is causing pain, you may take over-the-counter pain relievers such as acetaminophen (Tylenol). If you were prescribed a stronger pain medication, please be aware these medications can cause nausea and constipation. Prevent nausea by taking the medication with a snack or meal. Avoid constipation by drinking plenty of fluids and eating foods with a high amount of fiber, such as fruits, vegetables, and grains.  Do not take Tylenol if you are taking prescription pain medications.   Follow up  Our office will schedule a follow-up appointment with a CT scan 3-4 weeks after your surgery.  Please call us immediately for any of the following conditions  Severe or worsening pain in your legs or feet or in your abdomen back or chest. Increased pain, redness, drainage (pus) from your incision site. Increased abdominal pain, bloating, nausea, vomiting or persistent diarrhea. Fever of 101 degrees or higher. Swelling in your leg (s),  Reduce your risk of vascular disease  Stop smoking. If you would like help call [facility] at 1-800-QUIT-NOW (1-800-784-8669) or [place] at 336-586-4000. Manage your cholesterol Maintain a desired weight Control your diabetes Keep your blood pressure down  If you have questions, please call the office at [phone].  

## 2021-08-22 ENCOUNTER — Other Ambulatory Visit (HOSPITAL_COMMUNITY): Payer: Self-pay

## 2021-08-22 ENCOUNTER — Encounter (HOSPITAL_COMMUNITY): Payer: Self-pay | Admitting: Vascular Surgery

## 2021-08-22 ENCOUNTER — Inpatient Hospital Stay (HOSPITAL_COMMUNITY): Payer: Medicare HMO

## 2021-08-22 DIAGNOSIS — I701 Atherosclerosis of renal artery: Secondary | ICD-10-CM

## 2021-08-22 LAB — BASIC METABOLIC PANEL
Anion gap: 7 (ref 5–15)
BUN: 13 mg/dL (ref 8–23)
CO2: 26 mmol/L (ref 22–32)
Calcium: 8.5 mg/dL — ABNORMAL LOW (ref 8.9–10.3)
Chloride: 104 mmol/L (ref 98–111)
Creatinine, Ser: 0.97 mg/dL (ref 0.61–1.24)
GFR, Estimated: >60 mL/min (ref >60)
Glucose, Bld: 140 mg/dL — ABNORMAL HIGH (ref 70–99)
Potassium: 4.7 mmol/L (ref 3.5–5.1)
Sodium: 137 mmol/L (ref 135–145)

## 2021-08-22 LAB — CBC
HCT: 39.2 % (ref 39.0–52.0)
Hemoglobin: 13.1 g/dL (ref 13.0–17.0)
MCH: 30.2 pg (ref 26.0–34.0)
MCHC: 33.4 g/dL (ref 30.0–36.0)
MCV: 90.3 fL (ref 80.0–100.0)
Platelets: 234 10*3/uL (ref 150–400)
RBC: 4.34 MIL/uL (ref 4.22–5.81)
RDW: 14.1 % (ref 11.5–15.5)
WBC: 12 10*3/uL — ABNORMAL HIGH (ref 4.0–10.5)
nRBC: 0 % (ref 0.0–0.2)

## 2021-08-22 MED ORDER — HYDROCODONE-ACETAMINOPHEN 5-325 MG PO TABS
1.0000 | ORAL_TABLET | Freq: Four times a day (QID) | ORAL | 0 refills | Status: DC | PRN
  Filled 2021-08-22: qty 8, 3d supply, fill #0

## 2021-08-22 NOTE — Progress Notes (Signed)
°  Progress Note    08/22/2021 11:48 AM 1 Day Post-Op  Subjective:  ready to go home; has not voided or walked yet.  Says the catheter just came out.   Afebrile HR 70's-90's NSR 585'F-292'K systolic 46% RA  Vitals:   08/22/21 0802 08/22/21 1026  BP: 126/81 135/82  Pulse: 70   Resp: 18   Temp: 97.8 F (36.6 C)   SpO2: 91%     Physical Exam: General:  no distress Lungs:  non labored Incisions:  bilateral groins are clean and dry Extremities:  palpable DP pulses bilaterally Abdomen:  soft, NT  CBC    Component Value Date/Time   WBC 12.0 (H) 08/22/2021 0440   RBC 4.34 08/22/2021 0440   HGB 13.1 08/22/2021 0440   HGB 15.2 10/07/2020 1247   HCT 39.2 08/22/2021 0440   HCT 44.7 10/07/2020 1247   PLT 234 08/22/2021 0440   PLT 304 10/07/2020 1247   MCV 90.3 08/22/2021 0440   MCV 89 10/07/2020 1247   MCH 30.2 08/22/2021 0440   MCHC 33.4 08/22/2021 0440   RDW 14.1 08/22/2021 0440   RDW 14.2 10/07/2020 1247   LYMPHSABS 1.7 11/21/2017 1057   MONOABS 0.5 11/10/2014 1446   EOSABS 0.2 11/21/2017 1057   BASOSABS 0.1 11/21/2017 1057    BMET    Component Value Date/Time   NA 137 08/22/2021 0440   NA 137 06/09/2021 1548   K 4.7 08/22/2021 0440   CL 104 08/22/2021 0440   CO2 26 08/22/2021 0440   GLUCOSE 140 (H) 08/22/2021 0440   BUN 13 08/22/2021 0440   BUN 13 06/09/2021 1548   CREATININE 0.97 08/22/2021 0440   CREATININE 0.81 04/30/2014 1446   CALCIUM 8.5 (L) 08/22/2021 0440   GFRNONAA >60 08/22/2021 0440   GFRAA 78 03/25/2019 1432    INR    Component Value Date/Time   INR 1.0 08/21/2021 1200     Intake/Output Summary (Last 24 hours) at 08/22/2021 1148 Last data filed at 08/22/2021 1031 Gross per 24 hour  Intake 1794.29 ml  Output 4200 ml  Net -2405.71 ml      Assessment/Plan:  66 y.o. male is s/p:  EVAR  1 Day Post-Op   -palpable DP pulses bilaterally and bilateral groins look good without hematoma -talked with Dr. Virl Cagey and pt is scheduled for  renal artery duplex this morning.  If all looks fine, plan for dc later today. -pt needs to void and walk before discharge -pt will f/u with Dr. Virl Cagey in 4 weeks with CTA protocol and then u/s thereafter for follow up.  -continue statin/plavix.   Leontine Locket, PA-C [facility] [phone] 08/22/2021 11:48 AM  VASCULAR STAFF ADDENDUM: I have independently interviewed and examined the patient. I agree with the above.  Renal ultrasound without stenosis.  Doing excellent with palpable pulses in the feet. No abd pain.  Cassandria Santee, MD [facility] Phone Number: [phone] 08/22/2021 11:48 AM

## 2021-08-22 NOTE — Discharge Summary (Signed)
EVAR Discharge Summary   Lawrence Bennett 1956-04-06 66 y.o. male  MRN: [id number]  Admission Date: 08/21/2021  Discharge Date: 08/22/2021  Physician: Dr. Virl Cagey  Admission Diagnosis: Abdominal aortic aneurysm (AAA) greater than 5.5 cm in diameter in male [I71.40]   HPI:   This is a 66 y.o. male presenting in follow-up after recent abdominal duplex ultrasound demonstrated 5.4 cm abdominal aortic aneurysm.   He presents today accompanied by his wife.  Since his last visit, Lawrence Bennett underwent CT angiography of his abdomen and pelvis to further define his infrarenal abdominal aorta.  He has had no new symptoms, and denied history of back pain, belly pain, chest pain-outside of his chronic back pain that has been present for quite some time.  At her last visit, we discussed working with physical therapy, but he elected to hold off until his aneurysm was repaired.   Lawrence Bennett's mother had an abdominal aortic aneurysm. No history of vascular surgeries   The pt is  on a statin for cholesterol management.  The pt is not on a daily aspirin.   Other AC:  plavix The pt is on on medication for hypertension.   The pt is not diabetic.  Tobacco hx:  none  Hospital Course:  The patient was admitted to the hospital and taken to the operating room on 08/21/2021 and underwent: Ultrasound guided micropuncture access of bilateral common femoral arteries Endovascular aortic repair using Gore C3 conformable device   Devices  From the right: 20 x 14 x 12 cm main body, 16 x 11.5 cm limb extension From the left: 16 x 13.5 cm contralateral limb    Findings: Bilateral accessory renal arteries Left-sided excess renal artery covered by the endograft Graft abutting the main right renal artery  The pt tolerated the procedure well and was transported to the PACU in good condition.    By POD 1, pt doing well.  Bilateral groins soft without hematoma.  He voided and ambulated.    He underwent renal artery  duplex, which revealed no renal artery stenosis.  He is discharged home.    CBC    Component Value Date/Time   WBC 12.0 (H) 08/22/2021 0440   RBC 4.34 08/22/2021 0440   HGB 13.1 08/22/2021 0440   HGB 15.2 10/07/2020 1247   HCT 39.2 08/22/2021 0440   HCT 44.7 10/07/2020 1247   PLT 234 08/22/2021 0440   PLT 304 10/07/2020 1247   MCV 90.3 08/22/2021 0440   MCV 89 10/07/2020 1247   MCH 30.2 08/22/2021 0440   MCHC 33.4 08/22/2021 0440   RDW 14.1 08/22/2021 0440   RDW 14.2 10/07/2020 1247   LYMPHSABS 1.7 11/21/2017 1057   MONOABS 0.5 11/10/2014 1446   EOSABS 0.2 11/21/2017 1057   BASOSABS 0.1 11/21/2017 1057    BMET    Component Value Date/Time   NA 137 08/22/2021 0440   NA 137 06/09/2021 1548   K 4.7 08/22/2021 0440   CL 104 08/22/2021 0440   CO2 26 08/22/2021 0440   GLUCOSE 140 (H) 08/22/2021 0440   BUN 13 08/22/2021 0440   BUN 13 06/09/2021 1548   CREATININE 0.97 08/22/2021 0440   CREATININE 0.81 04/30/2014 1446   CALCIUM 8.5 (L) 08/22/2021 0440   GFRNONAA >60 08/22/2021 0440   GFRAA 78 03/25/2019 1432       Discharge Instructions     Call MD for:  redness, tenderness, or signs of infection (pain, swelling, bleeding, redness, odor or green/yellow discharge around incision site)  Complete by: As directed    Call MD for:  severe or increased pain, loss or decreased feeling  in affected limb(s)   Complete by: As directed    Call MD for:  temperature >100.5   Complete by: As directed    Discharge patient   Complete by: As directed    Discharge disposition: 01-Home or Self Care   Discharge patient date: 08/22/2021   Resume previous diet   Complete by: As directed        Discharge Diagnosis:  Abdominal aortic aneurysm (AAA) greater than 5.5 cm in diameter in male [I71.40]  Secondary Diagnosis: Patient Active Problem List   Diagnosis Date Noted   Abdominal aortic aneurysm (AAA) greater than 5.5 cm in diameter in male 08/21/2021   Bilateral sensorineural  hearing loss 04/03/2021   Loose stools 12/13/2019   Change in bowel habits 12/13/2019   Colon cancer screening 12/13/2019   Altered bowel function 10/30/2019   Onychomycosis of toenail 08/26/2018   Pre-diabetes 07/25/2017   History of stroke 03/23/2016   HLD (hyperlipidemia) 12/20/2015   BPH (benign prostatic hyperplasia) 11/11/2015   Healthcare maintenance 11/11/2015   Major depressive disorder, recurrent episode, moderate ([place])    Diverticulosis of colon without hemorrhage 05/02/2014   Essential hypertension 04/15/2014   Anxiety 04/15/2014   Hearing loss 04/15/2014   Dizziness 04/15/2014   Past Medical History:  Diagnosis Date   AAA (abdominal aortic aneurysm)    Anxiety    Atherosclerosis of aorta (HCC)    Atrial contractions, premature    Depression    Dyslipidemia    Dyspnea    Elevated glucose 11/11/2015   Hyperlipidemia    Mild hypertension    Stroke [facility])    Urinary hesitancy      Allergies as of 08/22/2021   No Known Allergies      Medication List     TAKE these medications    amLODipine 10 MG tablet Commonly known as: NORVASC Take 1 tablet (10 mg total) by mouth daily.   atorvastatin 80 MG tablet Commonly known as: LIPITOR TAKE 1 TABLET (80 MG TOTAL) BY MOUTH DAILY.   clopidogrel 75 MG tablet Commonly known as: PLAVIX Take 1 tablet by mouth once daily.   divalproex 500 MG 24 hr tablet Commonly known as: DEPAKOTE ER TAKE 1 TABLET BY MOUTH AT BEDTIME.   HYDROcodone-acetaminophen 5-325 MG tablet Commonly known as: Norco Take 1 tablet by mouth every 6 (six) hours as needed for moderate pain.   LORazepam 0.5 MG tablet Commonly known as: ATIVAN TAKE 1 TABLET BY MOUTH 3 TIMES DAILY.   losartan 50 MG tablet Commonly known as: COZAAR Take 1 tablet (50 mg total) by mouth daily.   PARoxetine 20 MG tablet Commonly known as: PAXIL TAKE 1 TABLET BY MOUTH AT BEDTIME.   QUEtiapine 50 MG tablet Commonly known as: SEROQUEL TAKE 1 TABLET BY MOUTH  AT BEDTIME.   tamsulosin 0.4 MG Caps capsule Commonly known as: FLOMAX TAKE 1 CAPSULE BY MOUTH IN THE MORNING AND AT BEDTIME   traZODone 50 MG tablet Commonly known as: DESYREL TAKE 1 TABLET BY MOUTH AT BEDTIME.   venlafaxine XR 75 MG 24 hr capsule Commonly known as: EFFEXOR-XR Take 1 capsule (75 mg total) by mouth daily with breakfast.   vitamin C 500 MG tablet Commonly known as: ASCORBIC ACID Take 500 mg by mouth daily.   zinc gluconate 50 MG tablet Take 50 mg by mouth daily.  Discharge Instructions:  [facility]  Discharge Instructions Endovascular Aortic Aneurysm Repair  Please refer to the following instructions for your post-procedure care. Your surgeon or Physician Assistant will discuss any changes with you.  Activity  You are encouraged to walk as much as you can. You can slowly return to normal activities but must avoid strenuous activity and heavy lifting until your doctor tells you it's OK. Avoid activities such as vacuuming or swinging a gold club. It is normal to feel tired for several weeks after your surgery. Do not drive until your doctor gives the OK and you are no longer taking prescription pain medications. It is also normal to have difficulty with sleep habits, eating, and bowel movements after surgery. These will go away with time.  Bathing/Showering  You may shower after you go home. If you have an incision, do not soak in a bathtub, hot tub, or swim until the incision heals completely.  Incision Care  Shower every day. Clean your incision with mild soap and water. Pat the area dry with a clean towel. You do not need a bandage unless otherwise instructed. Do not apply any ointments or creams to your incision. If you clothing is irritating, you may cover your incision with a dry gauze pad.  Diet  Resume your normal diet. There are no special food restrictions following this procedure. A low fat/low cholesterol  diet is recommended for all patients with vascular disease. In order to heal from your surgery, it is CRITICAL to get adequate nutrition. Your body requires vitamins, minerals, and protein. Vegetables are the best source of vitamins and minerals. Vegetables also provide the perfect balance of protein. Processed food has little nutritional value, so try to avoid this.  Medications  Resume taking all of your medications unless your doctor or Physician Assistnat tells you not to. If your incision is causing pain, you may take over-the-counter pain relievers such as acetaminophen (Tylenol). If you were prescribed a stronger pain medication, please be aware these medications can cause nausea and constipation. Prevent nausea by taking the medication with a snack or meal. Avoid constipation by drinking plenty of fluids and eating foods with a high amount of fiber, such as fruits, vegetables, and grains.  Do not take Tylenol if you are taking prescription pain medications.   Follow up  [place] office will schedule a follow-up appointment with a C.T. scan 3-4 weeks after your surgery.  Please call us immediately for any of the following conditions  Severe or worsening pain in your legs or feet or in your abdomen back or chest. Increased pain, redness, drainage (pus) from your incision sit. Increased abdominal pain, bloating, nausea, vomiting or persistent diarrhea. Fever of 101 degrees or higher. Swelling in your leg (s),  Reduce your risk of vascular disease  Stop smoking. If you would like help call [facility] at 1-800-QUIT-NOW 769-676-3892) or [place] at [phone]. Manage your cholesterol Maintain a desired weight Control your diabetes Keep your blood pressure down  If you have questions, please call the office at [phone].   Prescriptions given: 1.  Norco#8 No Refill  Disposition: home  Patient's condition: is Good  Follow up: 1. Dr. Virl Cagey in 4 weeks with CTA  protocol   Leontine Locket, PA-C [facility] [phone] 08/23/2021  7:59 AM   - For VQI Registry use - Post-op:  Time to Extubation: [x]  In OR, [ ]  < 12 hrs, [ ]  12-24 hrs, [ ]  >=24  hrs Vasopressors Req. Post-op: No MI: No., [ ]  Troponin only, [ ]  EKG or Clinical New Arrhythmia: No CHF: No ICU Stay: 1 day in progressive Transfusion: No     If yes, n/a units given  Complications: Resp failure: No., [ ]  Pneumonia, [ ]  Ventilator Chg in renal function: No., [ ]  Inc. Cr > 0.5, [ ]  Temp. Dialysis,  [ ]  Permanent dialysis Leg ischemia: No., no Surgery needed, [ ]  Yes, Surgery needed,  [ ]  Amputation Bowel ischemia: No., [ ]  Medical Rx, [ ]  Surgical Rx Wound complication: No., [ ]  Superficial separation/infection, [ ]  Return to OR Return to OR: No  Return to OR for bleeding: No Stroke: No., [ ]  Minor, [ ]  Major  Discharge medications: Statin use:  Yes ASA use:  No  Plavix use:  Yes  Beta blocker use:  No  ARB use:  Yes ACEI use:  No CCB use:  Yes

## 2021-08-22 NOTE — Plan of Care (Signed)
°  Problem: Education: Goal: Knowledge of General Education information will improve Description: Including pain rating scale, medication(s)/side effects and non-pharmacologic comfort measures Outcome: Adequate for Discharge   Problem: Health Behavior/Discharge Planning: Goal: Ability to manage health-related needs will improve Outcome: Adequate for Discharge   Problem: Clinical Measurements: Goal: Ability to maintain clinical measurements within normal limits will improve Outcome: Adequate for Discharge Goal: Will remain free from infection Outcome: Adequate for Discharge Goal: Diagnostic test results will improve Outcome: Adequate for Discharge Goal: Respiratory complications will improve Outcome: Adequate for Discharge Goal: Cardiovascular complication will be avoided Outcome: Adequate for Discharge   Problem: Activity: Goal: Risk for activity intolerance will decrease Outcome: Adequate for Discharge   Problem: Nutrition: Goal: Adequate nutrition will be maintained Outcome: Adequate for Discharge   Problem: Coping: Goal: Level of anxiety will decrease Outcome: Adequate for Discharge   Problem: Elimination: Goal: Will not experience complications related to bowel motility Outcome: Adequate for Discharge Goal: Will not experience complications related to urinary retention Outcome: Adequate for Discharge   Problem: Pain Managment: Goal: General experience of comfort will improve Outcome: Adequate for Discharge   Problem: Safety: Goal: Ability to remain free from injury will improve Outcome: Adequate for Discharge   Problem: Skin Integrity: Goal: Risk for impaired skin integrity will decrease Outcome: Adequate for Discharge   Problem: Education: Goal: Knowledge of the prescribed therapeutic regimen will improve Outcome: Adequate for Discharge   Problem: Cardiac: Goal: Ability to maintain an adequate cardiac output will improve Outcome: Adequate for Discharge    Problem: Clinical Measurements: Goal: Postoperative complications will be avoided or minimized Outcome: Adequate for Discharge   Problem: Respiratory: Goal: Respiratory status will improve Outcome: Adequate for Discharge   Problem: Skin Integrity: Goal: Demonstration of wound healing without infection will improve Outcome: Adequate for Discharge   Problem: Urinary Elimination: Goal: Ability to achieve and maintain adequate renal perfusion and functioning will improve Outcome: Adequate for Discharge

## 2021-08-22 NOTE — TOC Transition Note (Signed)
Transition of Care (TOC) - CM/SW Discharge Note Marvetta Gibbons RN, BSN Transitions of Care Unit 4E- RN Case Manager See Treatment Team for direct phone #    Patient Details  Name: DEVLIN BRINK MRN: [id number] Date of Birth: 09-15-1955  Transition of Care [facility] CM/SW Contact:  Dawayne Patricia, RN Phone Number: 08/22/2021, 11:31 AM   Clinical Narrative:    Transition of Care Department [facility]) has reviewed patient and no TOC needs have been identified at this time. We will continue to monitor patient advancement through interdisciplinary progression rounds. If new patient transition needs arise, please place a TOC consult.     Final next level of care: Home/Self Care Barriers to Discharge: No Barriers Identified   Patient Goals and CMS Choice     Choice offered to / list presented to : NA  Discharge Placement               Home        Discharge Plan and Services                DME Arranged: N/A DME Agency: NA       HH Arranged: NA HH Agency: NA        Social Determinants of Health (SDOH) Interventions     Readmission Risk Interventions Readmission Risk Prevention Plan 08/22/2021  Post Dischage Appt Complete  Medication Screening Complete  Transportation Screening Complete  Some recent data might be hidden

## 2021-08-22 NOTE — Progress Notes (Addendum)
°  Progress Note    08/22/2021 6:50 AM 1 Day Post-Op  Subjective:  ready to go home; has not voided or walked yet.  Says the catheter just came out.   Afebrile HR 70's-90's NSR 169'C-789'F systolic 81% RA  Vitals:   08/21/21 2325 08/22/21 0448  BP: (!) 156/82 135/88  Pulse: 77 72  Resp: 20 16  Temp: 97.8 F (36.6 C) 97.6 F (36.4 C)  SpO2: 92% 90%    Physical Exam: General:  no distress Lungs:  non labored Incisions:  bilateral groins are clean and dry Extremities:  palpable DP pulses bilaterally Abdomen:  soft, NT  CBC    Component Value Date/Time   WBC 12.0 (H) 08/22/2021 0440   RBC 4.34 08/22/2021 0440   HGB 13.1 08/22/2021 0440   HGB 15.2 10/07/2020 1247   HCT 39.2 08/22/2021 0440   HCT 44.7 10/07/2020 1247   PLT 234 08/22/2021 0440   PLT 304 10/07/2020 1247   MCV 90.3 08/22/2021 0440   MCV 89 10/07/2020 1247   MCH 30.2 08/22/2021 0440   MCHC 33.4 08/22/2021 0440   RDW 14.1 08/22/2021 0440   RDW 14.2 10/07/2020 1247   LYMPHSABS 1.7 11/21/2017 1057   MONOABS 0.5 11/10/2014 1446   EOSABS 0.2 11/21/2017 1057   BASOSABS 0.1 11/21/2017 1057    BMET    Component Value Date/Time   NA 137 08/22/2021 0440   NA 137 06/09/2021 1548   K 4.7 08/22/2021 0440   CL 104 08/22/2021 0440   CO2 26 08/22/2021 0440   GLUCOSE 140 (H) 08/22/2021 0440   BUN 13 08/22/2021 0440   BUN 13 06/09/2021 1548   CREATININE 0.97 08/22/2021 0440   CREATININE 0.81 04/30/2014 1446   CALCIUM 8.5 (L) 08/22/2021 0440   GFRNONAA >60 08/22/2021 0440   GFRAA 78 03/25/2019 1432    INR    Component Value Date/Time   INR 1.0 08/21/2021 1200     Intake/Output Summary (Last 24 hours) at 08/22/2021 0650 Last data filed at 08/22/2021 0175 Gross per 24 hour  Intake 2314.29 ml  Output 4250 ml  Net -1935.71 ml     Assessment/Plan:  66 y.o. male is s/p:  EVAR  1 Day Post-Op   -palpable DP pulses bilaterally and bilateral groins look good without hematoma -talked with Dr.  Virl Cagey and pt is scheduled for renal artery duplex this morning.  If all looks fine, plan for dc later today. -pt needs to void and walk before discharge -pt will f/u with Dr. Virl Cagey in 4 weeks with CTA protocol and then u/s thereafter for follow up.  -continue statin/plavix.   Leontine Locket, PA-C [facility] [phone] 08/22/2021 6:50 AM

## 2021-08-22 NOTE — Progress Notes (Signed)
Renal artery duplex has been completed.  Some portions were limited due to habitus.   Results can be found under chart review under CV PROC. 08/22/2021 9:58 AM Ashaz Robling RVT, RDMS

## 2021-08-23 ENCOUNTER — Other Ambulatory Visit (HOSPITAL_COMMUNITY): Payer: Self-pay

## 2021-08-23 LAB — POCT ACTIVATED CLOTTING TIME: Activated Clotting Time: 227 seconds

## 2021-08-25 ENCOUNTER — Ambulatory Visit: Payer: Medicare HMO | Admitting: Cardiology

## 2021-08-29 ENCOUNTER — Other Ambulatory Visit: Payer: Self-pay

## 2021-08-29 DIAGNOSIS — I714 Abdominal aortic aneurysm, without rupture, unspecified: Secondary | ICD-10-CM

## 2021-08-30 ENCOUNTER — Other Ambulatory Visit (HOSPITAL_COMMUNITY): Payer: Self-pay

## 2021-09-06 ENCOUNTER — Other Ambulatory Visit (HOSPITAL_COMMUNITY): Payer: Self-pay

## 2021-09-08 ENCOUNTER — Ambulatory Visit: Payer: Medicare HMO | Admitting: Cardiology

## 2021-09-19 ENCOUNTER — Ambulatory Visit
Admission: RE | Admit: 2021-09-19 | Discharge: 2021-09-19 | Disposition: A | Discharge status: Home / Self Care | Payer: Medicare HMO | Source: Ambulatory Visit | Attending: Vascular Surgery | Admitting: Vascular Surgery

## 2021-09-19 ENCOUNTER — Other Ambulatory Visit (HOSPITAL_COMMUNITY): Payer: Self-pay

## 2021-09-19 DIAGNOSIS — I714 Abdominal aortic aneurysm, without rupture, unspecified: Secondary | ICD-10-CM

## 2021-09-19 DIAGNOSIS — I7133 Infrarenal abdominal aortic aneurysm, ruptured: Secondary | ICD-10-CM | POA: Diagnosis not present

## 2021-09-19 MED ORDER — IOPAMIDOL (ISOVUE-370) INJECTION 76%
80.0000 mL | Freq: Once | INTRAVENOUS | Status: AC | PRN
  Administered 2021-09-19: 80 mL via INTRAVENOUS

## 2021-09-21 NOTE — Progress Notes (Signed)
?Office Note  ? ? ? ?CC:  AAA ?Requesting Provider:  [name], DO ? ?HPI: Lawrence Bennett is a 66 y.o. (03-Mar-1956) male presenting in follow-up s/p 08/21/21 EVAR for 5.7cm abdominal aortic aneurysm. ? ?On exam today, Lawrence Bennett was doing well, accompanied by his wife.  He has had no issues over the last month. ?Denies back, abdominal, chest pain. ?No symptoms of claudication, no rest pain in the feet, no tissue loss. ? ?The pt is  on a statin for cholesterol management.  ?The pt is not on a daily aspirin.   Other AC:  plavix ?The pt is on on medication for hypertension.   ?The pt is not diabetic.  ?Tobacco hx:  none ? ?Past Medical History:  ?Diagnosis Date  ? AAA (abdominal aortic aneurysm)   ? Anxiety   ? Atherosclerosis of aorta ([place])   ? Atrial contractions, premature   ? Depression   ? Dyslipidemia   ? Dyspnea   ? Elevated glucose 11/11/2015  ? Hyperlipidemia   ? Mild hypertension   ? Stroke [facility])   ? Urinary hesitancy   ? ? ?Past Surgical History:  ?Procedure Laterality Date  ? ABDOMINAL AORTIC ENDOVASCULAR STENT GRAFT Bilateral 08/21/2021  ? Procedure: ENDOVASCULAR AORTIC STENT GRAFT REPAIR;  Surgeon: Broadus John, MD;  Location: [facility] OR;  Service: Vascular;  Laterality: Bilateral;  ? COLON RESECTION  2012  ? ULTRASOUND GUIDANCE FOR VASCULAR ACCESS Bilateral 08/21/2021  ? Procedure: ULTRASOUND GUIDANCE FOR VASCULAR ACCESS;  Surgeon: Broadus John, MD;  Location: [place];  Service: Vascular;  Laterality: Bilateral;  ? ? ?Social History  ? ?Socioeconomic History  ? Marital status: Married  ?  Spouse name: Not on file  ? Number of children: 3  ? Years of education: Not on file  ? Highest education level: Not on file  ?Occupational History  ? Occupation: retired  ?Tobacco Use  ? Smoking status: Former  ?  Packs/day: 0.20  ?  Years: 19.00  ?  Pack years: 3.80  ?  Types: Cigarettes  ? Smokeless tobacco: Never  ?Vaping Use  ? Vaping Use: Never used  ?Substance and Sexual Activity  ? Alcohol use: No  ?   Alcohol/week: 0.0 standard drinks  ? Drug use: No  ? Sexual activity: Not Currently  ?Other Topics Concern  ? Not on file  ?Social History Narrative  ? Right handed  ? Lives with wife  ? ?Social Determinants of Health  ? ?Financial Resource Strain: Low Risk   ? Difficulty of Paying Living Expenses: Not hard at all  ?Food Insecurity: No Food Insecurity  ? Worried About Charity fundraiser in the Last Year: Never true  ? Ran Out of Food in the Last Year: Never true  ?Transportation Needs: No Transportation Needs  ? Lack of Transportation (Medical): No  ? Lack of Transportation (Non-Medical): No  ?Physical Activity: Inactive  ? Days of Exercise per Week: 0 days  ? Minutes of Exercise per Session: 0 min  ?Stress: No Stress Concern Present  ? Feeling of Stress : Only a little  ?Social Connections: Socially Integrated  ? Frequency of Communication with Friends and Family: More than three times a week  ? Frequency of Social Gatherings with Friends and Family: More than three times a week  ? Attends Religious Services: 1 to 4 times per year  ? Active Member of Clubs or Organizations: Yes  ? Attends Archivist Meetings: 1 to 4 times per  year  ? Marital Status: Married  ?Intimate Partner Violence: Not At Risk  ? Fear of Current or Ex-Partner: No  ? Emotionally Abused: No  ? Physically Abused: No  ? Sexually Abused: No  ? ? ?Family History  ?Problem Relation Age of Onset  ? COPD Mother   ? Stroke Father   ? Bipolar disorder Father   ? Hypertension Sister   ? Depression Sister   ? Colon cancer Neg Hx   ? Esophageal cancer Neg Hx   ? Inflammatory bowel disease Neg Hx   ? Liver disease Neg Hx   ? Rectal cancer Neg Hx   ? Pancreatic cancer Neg Hx   ? Stomach cancer Neg Hx   ? ? ?Current Outpatient Medications  ?Medication Sig Dispense Refill  ? amLODipine (NORVASC) 10 MG tablet Take 1 tablet (10 mg total) by mouth daily. 90 tablet 3  ? atorvastatin (LIPITOR) 80 MG tablet TAKE 1 TABLET (80 MG TOTAL) BY MOUTH DAILY. 90  tablet 0  ? clopidogrel (PLAVIX) 75 MG tablet Take 1 tablet by mouth once daily. 30 tablet 11  ? divalproex (DEPAKOTE ER) 500 MG 24 hr tablet TAKE 1 TABLET BY MOUTH AT BEDTIME. 90 tablet 2  ? HYDROcodone-acetaminophen (NORCO) 5-325 MG tablet Take 1 tablet by mouth every 6 (six) hours as needed for moderate pain. 8 tablet 0  ? LORazepam (ATIVAN) 0.5 MG tablet TAKE 1 TABLET BY MOUTH 3 TIMES DAILY. 90 tablet 2  ? losartan (COZAAR) 50 MG tablet Take 1 tablet (50 mg total) by mouth daily. 90 tablet 3  ? PARoxetine (PAXIL) 20 MG tablet TAKE 1 TABLET BY MOUTH AT BEDTIME. 90 tablet 2  ? QUEtiapine (SEROQUEL) 50 MG tablet TAKE 1 TABLET BY MOUTH AT BEDTIME. 90 tablet 2  ? tamsulosin (FLOMAX) 0.4 MG CAPS capsule TAKE 1 CAPSULE BY MOUTH IN THE MORNING AND AT BEDTIME 180 capsule 1  ? traZODone (DESYREL) 50 MG tablet TAKE 1 TABLET BY MOUTH AT BEDTIME. 90 tablet 2  ? venlafaxine XR (EFFEXOR-XR) 75 MG 24 hr capsule Take 1 capsule (75 mg total) by mouth daily with breakfast. 90 capsule 2  ? vitamin C (ASCORBIC ACID) 500 MG tablet Take 500 mg by mouth daily.    ? zinc gluconate 50 MG tablet Take 50 mg by mouth daily.    ? ?No current facility-administered medications for this visit.  ? ? ?No Known Allergies ? ? ?REVIEW OF SYSTEMS:  ? ?'[X]'$  denotes positive finding, '[ ]'$  denotes negative finding ?Cardiac  Comments:  ?Chest pain or chest pressure:    ?Shortness of breath upon exertion:    ?Short of breath when lying flat:    ?Irregular heart rhythm:    ?    ?Vascular    ?Pain in calf, thigh, or hip brought on by ambulation:    ?Pain in feet at night that wakes you up from your sleep:     ?Blood clot in your veins:    ?Leg swelling:     ?    ?Pulmonary    ?Oxygen at home:    ?Productive cough:     ?Wheezing:     ?    ?Neurologic    ?Sudden weakness in arms or legs:     ?Sudden numbness in arms or legs:     ?Sudden onset of difficulty speaking or slurred speech:    ?Temporary loss of vision in one eye:     ?Problems with dizziness:     ?     ?  Gastrointestinal    ?Blood in stool:     ?Vomited blood:     ?    ?Genitourinary    ?Burning when urinating:     ?Blood in urine:    ?    ?Psychiatric    ?Major depression:     ?    ?Hematologic    ?Bleeding problems:    ?Problems with blood clotting too easily:    ?    ?Skin    ?Rashes or ulcers:    ?    ?Constitutional    ?Fever or chills:    ? ? ?PHYSICAL EXAMINATION: ? ?There were no vitals filed for this visit. ? ?General:  WDWN in NAD; vital signs documented above ?Gait: Not observed ?HENT: WNL, normocephalic ?Pulmonary: normal non-labored breathing , without wheezing ?Cardiac: regular HR,  ?Abdomen: soft, NT, no masses ?Skin: without rashes ?Vascular Exam/Pulses: ? Right Left  ?Radial 2+ (normal) 2+ (normal)  ?Ulnar 2+ (normal) 2+ (normal)  ?Femoral 2+ (normal) 2+ (normal)  ?Popliteal trace trace  ?DP 2+ (normal) 2+ (normal)  ?PT 2+ (normal) 2+ (normal)  ? ?Extremities: without ischemic changes, without Gangrene , without cellulitis; without open wounds;  ?Musculoskeletal: no muscle wasting or atrophy  ?Neurologic: A&O X 3;  No focal weakness or paresthesias are detected ?Psychiatric:  The pt has Normal affect. ? ? ?Non-Invasive Vascular Imaging:   ? ?09/29/21: CT angio abdomen pelvis reviewed demonstrating infrarenal placement of endovascular aortic repair.  Left-sided accessory renal covered as an intraoperatively ?Disease at the ostia of bilateral renal arteries present preop.  Appropriate filling of the kidneys. ?Type II endoleak from lumbar, IMA. ?No significant size change from previous ?Some compression of the left limb appreciated at the terminal aorta ? ? ? ?ASSESSMENT/PLAN: Lawrence Bennett is a 66 y.o. male presenting s/p 08/21/21 EVAR for asymptomatic carotid artery stenosis.  Imaging was reviewed demonstrating patent bilateral renal arteries, as well as right-sided excess renal artery.  There is a small type II endoleak, this appears inconsequential with no growth in the aneurysm sac size.  In  fact, there has been a small amount of regression.  Distally, the terminal aorta was small, and there is some mild left limb compression due to the size of the terminal aorta.  I have ordered noninvasive duple

## 2021-09-22 ENCOUNTER — Encounter: Payer: Self-pay | Admitting: Vascular Surgery

## 2021-09-22 ENCOUNTER — Ambulatory Visit (INDEPENDENT_AMBULATORY_CARE_PROVIDER_SITE_OTHER): Payer: Medicare HMO | Admitting: Vascular Surgery

## 2021-09-22 ENCOUNTER — Other Ambulatory Visit: Payer: Self-pay

## 2021-09-22 ENCOUNTER — Ambulatory Visit: Payer: Medicare HMO | Admitting: Neurology

## 2021-09-22 VITALS — BP 144/97 | HR 81 | Temp 98.1°F | Resp 20 | Ht 70.0 in | Wt 253.0 lb

## 2021-09-22 DIAGNOSIS — Z9889 Other specified postprocedural states: Secondary | ICD-10-CM

## 2021-09-22 DIAGNOSIS — Z8679 Personal history of other diseases of the circulatory system: Secondary | ICD-10-CM

## 2021-09-29 ENCOUNTER — Ambulatory Visit: Payer: Medicare HMO | Admitting: Cardiology

## 2021-09-29 ENCOUNTER — Encounter: Payer: Self-pay | Admitting: Cardiology

## 2021-09-29 ENCOUNTER — Other Ambulatory Visit: Payer: Self-pay | Admitting: *Deleted

## 2021-09-29 VITALS — BP 146/92 | HR 98 | Temp 97.9°F | Resp 95 | Ht 70.0 in | Wt 255.0 lb

## 2021-09-29 DIAGNOSIS — I700 Atherosclerosis of aorta: Secondary | ICD-10-CM | POA: Diagnosis not present

## 2021-09-29 DIAGNOSIS — R0609 Other forms of dyspnea: Secondary | ICD-10-CM

## 2021-09-29 DIAGNOSIS — Z6836 Body mass index (BMI) 36.0-36.9, adult: Secondary | ICD-10-CM | POA: Diagnosis not present

## 2021-09-29 DIAGNOSIS — Z8673 Personal history of transient ischemic attack (TIA), and cerebral infarction without residual deficits: Secondary | ICD-10-CM | POA: Diagnosis not present

## 2021-09-29 DIAGNOSIS — I714 Abdominal aortic aneurysm, without rupture, unspecified: Secondary | ICD-10-CM | POA: Diagnosis not present

## 2021-09-29 DIAGNOSIS — E782 Mixed hyperlipidemia: Secondary | ICD-10-CM

## 2021-09-29 DIAGNOSIS — I10 Essential (primary) hypertension: Secondary | ICD-10-CM | POA: Diagnosis not present

## 2021-09-29 DIAGNOSIS — Z8679 Personal history of other diseases of the circulatory system: Secondary | ICD-10-CM

## 2021-09-29 NOTE — Progress Notes (Signed)
? ?ID:  Lawrence Bennett, DOB 1956-02-03, MRN [id number] ? ?PCP:  Lawrence Norlander, DO  ?Cardiologist:  Lawrence Kras, DO, [facility] (established care 04/13/2021) ?Former Cardiology Providers: Dr. Lyman Bennett  ? ?Date: 09/29/21 ?Last Office Visit: 04/13/2021 ? ?Chief Complaint  ?Patient presents with  ? Follow-up  ?  6 month  ?S/p AAA repair  ?Hx of stroke.   ? ? ?HPI  ?Lawrence Bennett is a 66 y.o. male whose past medical history and cardiovascular risk factors include: History of PVCs, abdominal aortic aneurysm 5.7 cm status post EVAR (08/21/2021), history of stroke, former smoker, hypertension, hyperlipidemia, aortic atherosclerosis, obesity due to excess calories ? ?He is referred to the office at the request of Lawrence Norlander, DO for evaluation of history of stroke, sequelae of lacunar infarct. ? ?Patient is accompanied by his wife Lawrence Bennett at today's encounter.  Patient establish care with the practice back in October 2023 as he is noted to have bilateral CVAs on imaging study and concern for possible cardioembolic etiology.  At the last visit we discussed either extended Holter monitor or loop recorder implantation to monitor for atrial fibrillation.  However the work-up was pushed back as he underwent a abdominal duplex which noted a sizable abdominal aortic aneurysm.Marland Kitchen  He establish care with vascular surgery and underwent EVAR in February 2023. ? ?With regards to dyspnea on exertion patient states that his symptoms are improving but still present.  Office blood pressure not well controlled; however, patient states that his home blood pressures usually range between 120-130 mmHg.  Echocardiogram notes preserved LVEF without any significant valvular heart disease and nuclear stress test was reported to be intermediate risk as noted below.  These test results were reviewed independently with the patient and his wife during today's encounter.   ? ?Given the patient's difficulty in hearing his wife provided  collateral history as part of today's encounter.   ? ?FUNCTIONAL STATUS: ?No structured exercise program or daily routine but patient keeps himself active around the house. ? ?ALLERGIES: ?No Known Allergies ? ?MEDICATION LIST PRIOR TO VISIT: ?Current Meds  ?Medication Sig  ? amLODipine (NORVASC) 10 MG tablet Take 1 tablet (10 mg total) by mouth daily.  ? atorvastatin (LIPITOR) 80 MG tablet TAKE 1 TABLET (80 MG TOTAL) BY MOUTH DAILY.  ? clopidogrel (PLAVIX) 75 MG tablet Take 1 tablet by mouth once daily.  ? divalproex (DEPAKOTE ER) 500 MG 24 hr tablet TAKE 1 TABLET BY MOUTH AT BEDTIME.  ? HYDROcodone-acetaminophen (NORCO) 5-325 MG tablet Take 1 tablet by mouth every 6 (six) hours as needed for moderate pain.  ? LORazepam (ATIVAN) 0.5 MG tablet TAKE 1 TABLET BY MOUTH 3 TIMES DAILY.  ? losartan (COZAAR) 50 MG tablet Take 1 tablet (50 mg total) by mouth daily.  ? PARoxetine (PAXIL) 20 MG tablet TAKE 1 TABLET BY MOUTH AT BEDTIME.  ? QUEtiapine (SEROQUEL) 50 MG tablet TAKE 1 TABLET BY MOUTH AT BEDTIME.  ? tamsulosin (FLOMAX) 0.4 MG CAPS capsule TAKE 1 CAPSULE BY MOUTH IN THE MORNING AND AT BEDTIME  ? traZODone (DESYREL) 50 MG tablet TAKE 1 TABLET BY MOUTH AT BEDTIME.  ? venlafaxine XR (EFFEXOR-XR) 75 MG 24 hr capsule Take 1 capsule (75 mg total) by mouth daily with breakfast.  ? vitamin C (ASCORBIC ACID) 500 MG tablet Take 500 mg by mouth daily.  ? zinc gluconate 50 MG tablet Take 50 mg by mouth daily.  ?  ? ?PAST MEDICAL HISTORY: ?Past Medical History:  ?Diagnosis  Date  ? AAA (abdominal aortic aneurysm)   ? Anxiety   ? Atherosclerosis of aorta ([place])   ? Atrial contractions, premature   ? Depression   ? Dyslipidemia   ? Dyspnea   ? Elevated glucose 11/11/2015  ? Hyperlipidemia   ? Mild hypertension   ? Stroke [facility])   ? Urinary hesitancy   ? ? ?PAST SURGICAL HISTORY: ?Past Surgical History:  ?Procedure Laterality Date  ? ABDOMINAL AORTIC ENDOVASCULAR STENT GRAFT Bilateral 08/21/2021  ? Procedure: ENDOVASCULAR AORTIC STENT  GRAFT REPAIR;  Surgeon: Lawrence John, MD;  Location: [facility] OR;  Service: Vascular;  Laterality: Bilateral;  ? COLON RESECTION  2012  ? ULTRASOUND GUIDANCE FOR VASCULAR ACCESS Bilateral 08/21/2021  ? Procedure: ULTRASOUND GUIDANCE FOR VASCULAR ACCESS;  Surgeon: Lawrence John, MD;  Location: [place];  Service: Vascular;  Laterality: Bilateral;  ? ? ?FAMILY HISTORY: ?The patient family history includes Bipolar disorder in his father; COPD in his mother; Depression in his sister; Hypertension in his sister; Stroke in his father. ? ?SOCIAL HISTORY:  ?The patient  reports that he quit smoking about 8 years ago. His smoking use included cigarettes. He has a 3.80 pack-year smoking history. He has never used smokeless tobacco. He reports that he does not drink alcohol and does not use drugs. ? ?REVIEW OF SYSTEMS: ?Review of Systems  ?Constitutional: Negative for chills and fever.  ?HENT:  Positive for hearing loss (hard of hearing). Negative for hoarse voice and nosebleeds.   ?Eyes:  Negative for discharge, double vision and pain.  ?Cardiovascular:  Negative for chest pain, claudication, dyspnea on exertion, leg swelling, near-syncope, orthopnea, palpitations, paroxysmal nocturnal dyspnea and syncope.  ?Respiratory:  Negative for hemoptysis and shortness of breath.   ?Musculoskeletal:  Negative for muscle cramps and myalgias.  ?Gastrointestinal:  Negative for abdominal pain, constipation, diarrhea, hematemesis, hematochezia, melena, nausea and vomiting.  ?Neurological:  Negative for dizziness and light-headedness.  ?     At times difficulty with balance.   ? ?PHYSICAL EXAM: ? ?  09/29/2021  ?  1:25 PM 09/29/2021  ?  1:17 PM 09/22/2021  ?  9:00 AM  ?Vitals with BMI  ?Height  '5\' 10"'$  '5\' 10"'$   ?Weight  255 lbs 253 lbs  ?BMI  36.59 36.3  ?Systolic 350 093 818  ?Diastolic 92 95 97  ?Bennett 98 97 81  ? ? ?CONSTITUTIONAL: Well-developed and well-nourished. No acute distress.  ?SKIN: Skin is warm and dry. No rash noted. No cyanosis. No  pallor. No jaundice ?HEAD: Normocephalic and atraumatic.  ?EYES: No scleral icterus ?MOUTH/THROAT: Moist oral membranes.  ?NECK: No JVD present. No thyromegaly noted. No carotid bruits  ?LYMPHATIC: No visible cervical adenopathy.  ?CHEST Normal respiratory effort. No intercostal retractions  ?LUNGS: Clear to auscultation bilaterally.  No stridor. No wheezes. No rales.  ?CARDIOVASCULAR: Regular rate and rhythm, positive S1-S2, no murmurs rubs or gallops appreciated ?ABDOMINAL: No apparent ascites.  ?EXTREMITIES: No peripheral edema  ?HEMATOLOGIC: No significant bruising ?NEUROLOGIC: Oriented to person, place, and time. Nonfocal. Normal muscle tone.  ?PSYCHIATRIC: Normal mood and affect. Normal behavior. Cooperative ? ?MRI Brain W/O Contrast: ?03/17/2021 ?3 mm acute/early subacute lacunar infarct within the left basal ganglia. ?  ?Chronic small-vessel infarcts within the left corona radiata, left basal ganglia and right thalamus. The small-vessel infarcts within the right thalamus are from the brain MRI of 05/13/2014. ?  ?Background mild chronic small vessel ischemic disease within the cerebral white matter, progressed. ?  ?Moderate generalized cerebral atrophy, with  comparatively mild cerebellar atrophy, also progressed. ?  ?Paranasal sinus disease, as described. ?  ?Trace fluid within the bilateral mastoid air cells. ? ?CARDIAC DATABASE: ?EKG: ?09/29/2021: NSR, 90 bpm, normal axis, nonspecific ST-T changes.  ? ?Echocardiogram: ?04/21/2021:  ?Normal LV systolic function with visual EF 55-60%. Left ventricle cavity is normal in size. Normal wall thickness with basal septal hypertrophy.  ?Normal global wall motion. Normal diastolic filling pattern, normal LAP.  ?Trace tricuspid regurgitation. No evidence of pulmonary hypertension.  ?No prior study for comparison. ?  ?Stress Testing: ?Exercise nuclear stress test 04/26/2021:   ?Abnormal ECG stress. The patient exercised for 5 minutes and 41 seconds of a Bruce protocol,  achieving approximately 4.64 METs.  Reduced exercise tolerance. Hypertensive BP response. Resting EKG demonstrated normal sinus rhythm. No ST-T wave abnormalities. Peak EKG revealed 2 mm down sloping ST depress

## 2021-10-03 ENCOUNTER — Ambulatory Visit: Payer: Medicare HMO | Admitting: Neurology

## 2021-10-03 ENCOUNTER — Other Ambulatory Visit (HOSPITAL_COMMUNITY): Payer: Self-pay | Admitting: Psychiatry

## 2021-10-03 ENCOUNTER — Encounter: Payer: Self-pay | Admitting: Neurology

## 2021-10-03 ENCOUNTER — Other Ambulatory Visit (HOSPITAL_COMMUNITY): Payer: Self-pay

## 2021-10-03 VITALS — BP 145/82 | HR 97 | Ht 70.0 in | Wt 252.4 lb

## 2021-10-03 DIAGNOSIS — R2681 Unsteadiness on feet: Secondary | ICD-10-CM | POA: Diagnosis not present

## 2021-10-03 DIAGNOSIS — I6381 Other cerebral infarction due to occlusion or stenosis of small artery: Secondary | ICD-10-CM

## 2021-10-03 DIAGNOSIS — M545 Low back pain, unspecified: Secondary | ICD-10-CM

## 2021-10-03 MED ORDER — CLOPIDOGREL BISULFATE 75 MG PO TABS
75.0000 mg | ORAL_TABLET | Freq: Every day | ORAL | 11 refills | Status: DC
  Filled 2021-10-03 – 2021-10-23 (×2): qty 30, 30d supply, fill #0
  Filled 2021-11-18: qty 30, 30d supply, fill #1
  Filled 2021-12-21: qty 30, 30d supply, fill #2
  Filled 2022-01-12: qty 30, 30d supply, fill #3
  Filled 2022-02-21: qty 30, 30d supply, fill #4
  Filled 2022-03-12: qty 30, 30d supply, fill #5
  Filled 2022-04-03 – 2022-04-11 (×2): qty 30, 30d supply, fill #6
  Filled 2022-05-16: qty 30, 30d supply, fill #7
  Filled 2022-06-11: qty 30, 30d supply, fill #8
  Filled 2022-07-21 – 2022-07-23 (×2): qty 30, 30d supply, fill #9
  Filled 2022-08-21: qty 30, 30d supply, fill #10
  Filled 2022-09-13: qty 30, 30d supply, fill #11

## 2021-10-03 MED ORDER — LORAZEPAM 0.5 MG PO TABS
ORAL_TABLET | Freq: Three times a day (TID) | ORAL | 2 refills | Status: DC
  Filled 2021-10-03 (×3): qty 90, 30d supply, fill #0
  Filled 2021-10-03: qty 30, 30d supply, fill #0
  Filled 2021-10-04: qty 90, 30d supply, fill #0
  Filled 2021-11-03: qty 90, 30d supply, fill #1
  Filled 2021-12-07: qty 90, 30d supply, fill #2

## 2021-10-03 NOTE — Telephone Encounter (Signed)
Call for appt

## 2021-10-03 NOTE — Progress Notes (Signed)
? ?NEUROLOGY FOLLOW UP OFFICE NOTE ? ?Lawrence Bennett ?[id number] ?09-14-1955 ? ?HISTORY OF PRESENT ILLNESS: ?I had the pleasure of seeing [name] in follow-up in the neurology clinic on 10/03/2021.  The patient was last seen 6 months ago for stroke found on brain MRI done 03/2021 for balance changes. He is again accompanied by his wife who helps supplement the history today.  Records and images were personally reviewed where available.  MRI brain without contrast done 03/17/21 which showed a tiny 43m focus of restricted diffusion in the left lentiform nucleus consistent with an acute/early subacute lacunar infarct. There were chronic lacunar infarcts in the right thalamus, multiple small vessel infarcts in the left corona radiata and left basal ganglia. There was moderate chronic microvascular disease and diffuse atrophy. MRA neck no flow-limiting stenosis. The MRA head showed bilateral distal vertebral artery atherosclerosis with mild to moderate left > right V4 segment vertebral stenosis and similar stenosis of the left PCA superior P3 division. Echocardiogram done 04/2021 showed ER 55-60%, normal. He underwent EVAR of abdominal aortic aneurysm in 08/2021. He saw Cardiology recently and is considering proceeding with recommendation for holter monitor to assess for atrial fibrillation as part of stroke workup. He did not do the balance therapy, balance is still off, his wife would like him to proceed. He does have back pain. He denies any headaches, dizziness, speech changes, focal numbness/tingling/weakness, no falls.  ? ? ?History on Initial Assessment 03/21/2021: This is a pleasant 66year old right-handed man with a history of hypertension, hyperlipidemia, prior lacunar stroke with no residual deficits, hearing loss, anxiety, depression, presenting for evaluation of stroke. He was seen by his PCP on 03/10/21 for hypertension with BPs in the 180s/115. He had been having worsening balance for several weeks and  family was concerned about stroke. He denied any focal weakness, sensory changes. I personally reviewed MRI brain without contrast done 03/17/21 which showed a tiny 364mfocus of restricted diffusion in the left lentiform nucleus consistent with an acute/early subacute lacunar infarct. There were chronic lacunar infarcts in the right thalamus, multiple small vessel infarcts in the left corona radiata and left basal ganglia. There was moderate chronic microvascular disease and diffuse atrophy. He has been taking a daily aspirin and statin, amlodipine dose increased. BP today 116/76. ? ?He and his wife report that the balance changes just came on quick, he did not use to stumble all over the place or walk slow. 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 He feels balance changes started around 2 months ago. He denies any focal numbness/tingling/weakness. No speech or swallowing difficulties. He has rare headaches, no dizziness, diplopia, neck pain. He has back pain and urinary urgency. He gets out of breath walking short distances, his wife reports he does not exercise. He has been referred to Cardiology as well. ? ?He had an MRI brain in 2015 because while he was at his doctor's office, he "felt someone is missing something," then his MRI showed chronic lacunar infarcts (no acute changes) and he was told he had a stroke in the past which he was unaware of. He reports his memory is "not that great," but he denies missing medications. He has been taking his aspirin daily. He denies missing bill payments or getting lost driving, but he would not remember where he went the day prior. He reports a history of mental breakdown in 2015 where he had 6  ECT treatments. Mood currently is good. He is sleeping well. There is a history of stroke in his father and paternal aunt.  ? ? ?Laboratory Data: ?Lab Results  ?Component Value Date  ? HGBA1C 5.9 (H)  03/10/2021  ? ?Lab Results  ?Component Value Date  ? CHOL 156 10/07/2020  ? HDL 45 10/07/2020  ? [place] 92 10/07/2020  ? TRIG 103 10/07/2020  ? CHOLHDL 3.5 10/07/2020  ? ?Lab Results  ?Component Value Date  ? TSH 4.420 03/10/2021  ? ? ?Lab Results  ?Component Value Date  ? BJSEGBTD17 446 03/10/2021  ? ? ? ?PAST MEDICAL HISTORY: ?Past Medical History:  ?Diagnosis Date  ? AAA (abdominal aortic aneurysm)   ? Anxiety   ? Atherosclerosis of aorta ([place])   ? Atrial contractions, premature   ? Depression   ? Dyslipidemia   ? Dyspnea   ? Elevated glucose 11/11/2015  ? Hyperlipidemia   ? Mild hypertension   ? Stroke [facility])   ? Urinary hesitancy   ? ? ?MEDICATIONS: ?Current Outpatient Medications on File Prior to Visit  ?Medication Sig Dispense Refill  ? amLODipine (NORVASC) 10 MG tablet Take 1 tablet (10 mg total) by mouth daily. 90 tablet 3  ? atorvastatin (LIPITOR) 80 MG tablet TAKE 1 TABLET (80 MG TOTAL) BY MOUTH DAILY. 90 tablet 0  ? clopidogrel (PLAVIX) 75 MG tablet Take 1 tablet by mouth once daily. 30 tablet 11  ? divalproex (DEPAKOTE ER) 500 MG 24 hr tablet TAKE 1 TABLET BY MOUTH AT BEDTIME. 90 tablet 2  ? HYDROcodone-acetaminophen (NORCO) 5-325 MG tablet Take 1 tablet by mouth every 6 (six) hours as needed for moderate pain. 8 tablet 0  ? losartan (COZAAR) 50 MG tablet Take 1 tablet (50 mg total) by mouth daily. 90 tablet 3  ? PARoxetine (PAXIL) 20 MG tablet TAKE 1 TABLET BY MOUTH AT BEDTIME. 90 tablet 2  ? QUEtiapine (SEROQUEL) 50 MG tablet TAKE 1 TABLET BY MOUTH AT BEDTIME. 90 tablet 2  ? tamsulosin (FLOMAX) 0.4 MG CAPS capsule TAKE 1 CAPSULE BY MOUTH IN THE MORNING AND AT BEDTIME 180 capsule 1  ? traZODone (DESYREL) 50 MG tablet TAKE 1 TABLET BY MOUTH AT BEDTIME. 90 tablet 2  ? venlafaxine XR (EFFEXOR-XR) 75 MG 24 hr capsule Take 1 capsule (75 mg total) by mouth daily with breakfast. 90 capsule 2  ? vitamin C (ASCORBIC ACID) 500 MG tablet Take 500 mg by mouth daily.    ? zinc gluconate 50 MG tablet Take 50 mg by  mouth daily.    ? ?No current facility-administered medications on file prior to visit.  ? ? ?ALLERGIES: ?No Known Allergies ? ?FAMILY HISTORY: ?Family History  ?Problem Relation Age of Onset  ? COPD Mother   ? Stroke Father   ? Bipolar disorder Father   ? Hypertension Sister   ? Depression Sister   ? Colon cancer Neg Hx   ? Esophageal cancer Neg Hx   ? Inflammatory bowel disease Neg Hx   ? Liver disease Neg Hx   ? Rectal cancer Neg Hx   ? Pancreatic cancer Neg Hx   ? Stomach cancer Neg Hx   ? ? ?SOCIAL HISTORY: ?Social History  ? ?Socioeconomic History  ? Marital status: Married  ?  Spouse name: Not on file  ? Number of children: 3  ? Years of education: Not on file  ? Highest education level: Not on file  ?Occupational History  ? Occupation: retired  ?Tobacco Use  ?  Smoking status: Former  ?  Packs/day: 0.20  ?  Years: 19.00  ?  Pack years: 3.80  ?  Types: Cigarettes  ?  Quit date: 2015  ?  Years since quitting: 8.2  ? Smokeless tobacco: Never  ?Vaping Use  ? Vaping Use: Never used  ?Substance and Sexual Activity  ? Alcohol use: No  ?  Alcohol/week: 0.0 standard drinks  ? Drug use: No  ? Sexual activity: Not Currently  ?Other Topics Concern  ? Not on file  ?Social History Narrative  ? Right handed  ? Lives with wife  ? ?Social Determinants of Health  ? ?Financial Resource Strain: Low Risk   ? Difficulty of Paying Living Expenses: Not hard at all  ?Food Insecurity: No Food Insecurity  ? Worried About Charity fundraiser in the Last Year: Never true  ? Ran Out of Food in the Last Year: Never true  ?Transportation Needs: No Transportation Needs  ? Lack of Transportation (Medical): No  ? Lack of Transportation (Non-Medical): No  ?Physical Activity: Inactive  ? Days of Exercise per Week: 0 days  ? Minutes of Exercise per Session: 0 min  ?Stress: No Stress Concern Present  ? Feeling of Stress : Only a little  ?Social Connections: Socially Integrated  ? Frequency of Communication with Friends and Family: More than three  times a week  ? Frequency of Social Gatherings with Friends and Family: More than three times a week  ? Attends Religious Services: 1 to 4 times per year  ? Active Member of Clubs or Organizations: Yes  ? Kathy Breach

## 2021-10-03 NOTE — Patient Instructions (Signed)
Looking great! ? ?Continue Plavix ? ?2. Start monitoring BP at home and bring to your PCP for review, continue control of cholesterol and sugar levels ? ?3. Proceed with heart monitor and sleep study ? ?4. Please discuss local physical therapy for balance changes ? ?5. Follow-up in 8 months, call for any changes ?

## 2021-10-04 ENCOUNTER — Other Ambulatory Visit (HOSPITAL_COMMUNITY): Payer: Self-pay

## 2021-10-12 ENCOUNTER — Ambulatory Visit (INDEPENDENT_AMBULATORY_CARE_PROVIDER_SITE_OTHER): Payer: Medicare HMO | Admitting: Family Medicine

## 2021-10-12 ENCOUNTER — Encounter: Payer: Self-pay | Admitting: Family Medicine

## 2021-10-12 VITALS — BP 131/77 | HR 94 | Temp 97.7°F | Ht 70.0 in | Wt 253.4 lb

## 2021-10-12 DIAGNOSIS — Z125 Encounter for screening for malignant neoplasm of prostate: Secondary | ICD-10-CM | POA: Diagnosis not present

## 2021-10-12 DIAGNOSIS — Z95828 Presence of other vascular implants and grafts: Secondary | ICD-10-CM

## 2021-10-12 DIAGNOSIS — I6930 Unspecified sequelae of cerebral infarction: Secondary | ICD-10-CM | POA: Diagnosis not present

## 2021-10-12 DIAGNOSIS — R2689 Other abnormalities of gait and mobility: Secondary | ICD-10-CM | POA: Diagnosis not present

## 2021-10-12 DIAGNOSIS — I10 Essential (primary) hypertension: Secondary | ICD-10-CM

## 2021-10-12 DIAGNOSIS — Z23 Encounter for immunization: Secondary | ICD-10-CM

## 2021-10-12 NOTE — Patient Instructions (Signed)
Come in for fasting labs. ?

## 2021-10-12 NOTE — Progress Notes (Signed)
? ?Subjective: ?CC: Chronic follow-up for hypertension, aortic aneurysm ?PCP: [name], DO ?Lawrence Bennett is a 66 y.o. male presenting to clinic today for: ? ?1.  Hypertension with associated aortic aneurysm status post stent placement ?Patient reports that he has ultrasound scheduled for evaluation of his stent.  He is compliant with the Plavix and admits that he does bruise easily now that he is on this medicine.  He also has prolonged bleeding but nothing significant.  He admits that he is doing relatively well after this surgery and is tolerating exercise for the most part without difficulty.  He does sometimes have some issues with balance and he would like to get back on physical therapy to help with this.  His neurologist also recommended this, who is following him for the history of lacunar infarct.  No chest pain, shortness of breath, edema or dizziness reported. ? ? ?ROS: Per HPI ? ?No Known Allergies ?Past Medical History:  ?Diagnosis Date  ? AAA (abdominal aortic aneurysm) ([place])   ? Anxiety   ? Atherosclerosis of aorta ([place])   ? Atrial contractions, premature   ? Depression   ? Dyslipidemia   ? Dyspnea   ? Elevated glucose 11/11/2015  ? Hyperlipidemia   ? Mild hypertension   ? Stroke [facility])   ? Urinary hesitancy   ? ? ?Current Outpatient Medications:  ?  amLODipine (NORVASC) 10 MG tablet, Take 1 tablet (10 mg total) by mouth daily., Disp: 90 tablet, Rfl: 3 ?  aspirin EC 81 MG tablet, Take 81 mg by mouth daily. Swallow whole., Disp: , Rfl:  ?  atorvastatin (LIPITOR) 80 MG tablet, TAKE 1 TABLET (80 MG TOTAL) BY MOUTH DAILY., Disp: 90 tablet, Rfl: 0 ?  clopidogrel (PLAVIX) 75 MG tablet, Take 1 tablet by mouth once daily., Disp: 30 tablet, Rfl: 11 ?  divalproex (DEPAKOTE ER) 500 MG 24 hr tablet, TAKE 1 TABLET BY MOUTH AT BEDTIME., Disp: 90 tablet, Rfl: 2 ?  LORazepam (ATIVAN) 0.5 MG tablet, TAKE 1 TABLET BY MOUTH 3 TIMES DAILY., Disp: 90 tablet, Rfl: 2 ?  losartan (COZAAR) 50 MG tablet,  Take 1 tablet (50 mg total) by mouth daily., Disp: 90 tablet, Rfl: 3 ?  PARoxetine (PAXIL) 20 MG tablet, TAKE 1 TABLET BY MOUTH AT BEDTIME., Disp: 90 tablet, Rfl: 2 ?  QUEtiapine (SEROQUEL) 50 MG tablet, TAKE 1 TABLET BY MOUTH AT BEDTIME., Disp: 90 tablet, Rfl: 2 ?  tamsulosin (FLOMAX) 0.4 MG CAPS capsule, TAKE 1 CAPSULE BY MOUTH IN THE MORNING AND AT BEDTIME (Patient taking differently: 0.4 mg daily.), Disp: 180 capsule, Rfl: 1 ?  traZODone (DESYREL) 50 MG tablet, TAKE 1 TABLET BY MOUTH AT BEDTIME., Disp: 90 tablet, Rfl: 2 ?  venlafaxine XR (EFFEXOR-XR) 75 MG 24 hr capsule, Take 1 capsule (75 mg total) by mouth daily with breakfast., Disp: 90 capsule, Rfl: 2 ?  vitamin C (ASCORBIC ACID) 500 MG tablet, Take 500 mg by mouth daily., Disp: , Rfl:  ?  zinc gluconate 50 MG tablet, Take 50 mg by mouth daily., Disp: , Rfl:  ?Social History  ? ?Socioeconomic History  ? Marital status: Married  ?  Spouse name: Not on file  ? Number of children: 3  ? Years of education: Not on file  ? Highest education level: Not on file  ?Occupational History  ? Occupation: retired  ?Tobacco Use  ? Smoking status: Former  ?  Packs/day: 0.20  ?  Years: 19.00  ?  Pack years:  3.80  ?  Types: Cigarettes  ?  Quit date: 2015  ?  Years since quitting: 8.2  ? Smokeless tobacco: Never  ?Vaping Use  ? Vaping Use: Never used  ?Substance and Sexual Activity  ? Alcohol use: No  ?  Alcohol/week: 0.0 standard drinks  ? Drug use: No  ? Sexual activity: Not Currently  ?Other Topics Concern  ? Not on file  ?Social History Narrative  ? Right handed  ? Lives with wife  ? ?Social Determinants of Health  ? ?Financial Resource Strain: Low Risk   ? Difficulty of Paying Living Expenses: Not hard at all  ?Food Insecurity: No Food Insecurity  ? Worried About Charity fundraiser in the Last Year: Never true  ? Ran Out of Food in the Last Year: Never true  ?Transportation Needs: No Transportation Needs  ? Lack of Transportation (Medical): No  ? Lack of Transportation  (Non-Medical): No  ?Physical Activity: Inactive  ? Days of Exercise per Week: 0 days  ? Minutes of Exercise per Session: 0 min  ?Stress: No Stress Concern Present  ? Feeling of Stress : Only a little  ?Social Connections: Socially Integrated  ? Frequency of Communication with Friends and Family: More than three times a week  ? Frequency of Social Gatherings with Friends and Family: More than three times a week  ? Attends Religious Services: 1 to 4 times per year  ? Active Member of Clubs or Organizations: Yes  ? Attends Archivist Meetings: 1 to 4 times per year  ? Marital Status: Married  ?Intimate Partner Violence: Not At Risk  ? Fear of Current or Ex-Partner: No  ? Emotionally Abused: No  ? Physically Abused: No  ? Sexually Abused: No  ? ?Family History  ?Problem Relation Age of Onset  ? COPD Mother   ? Stroke Father   ? Bipolar disorder Father   ? Hypertension Sister   ? Depression Sister   ? Colon cancer Neg Hx   ? Esophageal cancer Neg Hx   ? Inflammatory bowel disease Neg Hx   ? Liver disease Neg Hx   ? Rectal cancer Neg Hx   ? Pancreatic cancer Neg Hx   ? Stomach cancer Neg Hx   ? ? ?Objective: ?Office vital signs reviewed. ?BP 131/77   Pulse 94   Temp 97.7 ?F (36.5 ?C)   Ht $R'5\' 10"'WC$  (1.778 m)   Wt 253 lb 6.4 oz (114.9 kg)   SpO2 95%   BMI 36.36 kg/m?  ? ?Physical Examination:  ?General: Awake, alert, well-appearing male, No acute distress ?HEENT: Sclera white.  Moist mucous membranes ?Cardio: regular rate and rhythm, S1S2 heard, no murmurs appreciated ?Pulm: clear to auscultation bilaterally, no wheezes, rhonchi or rales; normal work of breathing on room air ?Extremities: warm, well perfused, No edema, cyanosis or clubbing; +2 pulses bilaterally ?MSK: Ambulating with use of cane.  Gait is somewhat unsteady ?Neuro: Hard of hearing and has hearing device in place.  Alert and oriented.  Very pleasant and interactive ? ?Assessment/ Plan: ?66 y.o. male  ? ?Essential hypertension - Plan: Lipid  panel, CMP14+EGFR ? ?Sequela of lacunar infarction - Plan: Ambulatory referral to Physical Therapy ? ?Balance problems - Plan: Ambulatory referral to Physical Therapy ? ?S/P insertion of endovascular thoracic aortic stent graft - Plan: Lipid panel, CMP14+EGFR ? ?Morbid obesity ([place]) - Plan: Lipid panel, CMP14+EGFR, TSH ? ?Screening for malignant neoplasm of prostate - Plan: PSA ? ?Blood pressure well controlled.  No changes.  He will come in for fasting labs at a future date. ? ?Continues to have balance issues and was noted to have a lacunar infarction on recent imaging.  Continue to follow-up with neurology.  Referral to physical therapy has been placed for strengthening and balance training. ? ?He seems to be doing relatively well after his aortic surgery.  No evidence of fluid overload.  He is tolerating this exercise without difficulty except for the balance issues identified as above ? ?We will collect PSA along with fasting labs ? ?Shingles vaccination administered today ? ?No orders of the defined types were placed in this encounter. ? ?No orders of the defined types were placed in this encounter. ? ? ? ?[name], DO ?[place] ?[phone] ? ?

## 2021-10-13 ENCOUNTER — Ambulatory Visit (INDEPENDENT_AMBULATORY_CARE_PROVIDER_SITE_OTHER)
Admission: RE | Admit: 2021-10-13 | Discharge: 2021-10-13 | Disposition: A | Discharge status: Home / Self Care | Payer: Medicare HMO | Source: Ambulatory Visit | Attending: Vascular Surgery | Admitting: Vascular Surgery

## 2021-10-13 ENCOUNTER — Ambulatory Visit: Payer: Medicare HMO | Admitting: Family Medicine

## 2021-10-13 ENCOUNTER — Ambulatory Visit (HOSPITAL_COMMUNITY)
Admission: RE | Admit: 2021-10-13 | Discharge: 2021-10-13 | Disposition: A | Discharge status: Home / Self Care | Payer: Medicare HMO | Source: Ambulatory Visit | Attending: Vascular Surgery | Admitting: Vascular Surgery

## 2021-10-13 DIAGNOSIS — Z8679 Personal history of other diseases of the circulatory system: Secondary | ICD-10-CM | POA: Diagnosis not present

## 2021-10-13 DIAGNOSIS — Z9889 Other specified postprocedural states: Secondary | ICD-10-CM | POA: Insufficient documentation

## 2021-10-19 ENCOUNTER — Ambulatory Visit: Payer: Medicare HMO | Attending: Family Medicine

## 2021-10-19 DIAGNOSIS — R2689 Other abnormalities of gait and mobility: Secondary | ICD-10-CM | POA: Diagnosis not present

## 2021-10-19 DIAGNOSIS — I6930 Unspecified sequelae of cerebral infarction: Secondary | ICD-10-CM | POA: Insufficient documentation

## 2021-10-19 NOTE — Therapy (Signed)
[facility] ?[address] ?[place], [place], [address] ?Phone: [phone]   Fax:  [phone] ? ?Physical Therapy Evaluation ? ?Patient Details  ?Name: Lawrence Bennett ?MRN: [id number] ?Date of Birth: [birth year] ?Referring Provider (PT): Lajuana Ripple, DO ? ? ?Encounter Date: 10/19/2021 ? ? PT End of Session - 10/19/21 1345   ? ? Visit Number 1   ? Number of Visits 12   ? Date for PT Re-Evaluation 01/05/22   ? PT Start Time 1345   ? PT Stop Time 1428   ? PT Time Calculation (min) 43 min   ? Activity Tolerance Patient tolerated treatment well   ? Behavior During Therapy [facility] for tasks assessed/performed   ? ?  ?  ? ?  ? ? ?Past Medical History:  ?Diagnosis Date  ? AAA (abdominal aortic aneurysm) ([place])   ? Anxiety   ? Atherosclerosis of aorta ([place])   ? Atrial contractions, premature   ? Depression   ? Dyslipidemia   ? Dyspnea   ? Elevated glucose 11/11/2015  ? Hyperlipidemia   ? Mild hypertension   ? Stroke [facility])   ? Urinary hesitancy   ? ? ?Past Surgical History:  ?Procedure Laterality Date  ? ABDOMINAL AORTIC ENDOVASCULAR STENT GRAFT Bilateral 08/21/2021  ? Procedure: ENDOVASCULAR AORTIC STENT GRAFT REPAIR;  Surgeon: Broadus John, MD;  Location: [facility] OR;  Service: Vascular;  Laterality: Bilateral;  ? COLON RESECTION  2012  ? ULTRASOUND GUIDANCE FOR VASCULAR ACCESS Bilateral 08/21/2021  ? Procedure: ULTRASOUND GUIDANCE FOR VASCULAR ACCESS;  Surgeon: Broadus John, MD;  Location: [facility];  Service: Vascular;  Laterality: Bilateral;  ? ? ?There were no vitals filed for this visit. ? ? ? Subjective Assessment - 10/19/21 1347   ? ? Subjective Patient reports that he had had a slight stroke about 6 months ago .However, he was told that his stroke may not have a caused his balance problem. He noticed that going up and down stairs have gotten really difficult and he now has to walk up sideways. He has not noticed any dizziness. He has been having back pain which limits his ability to  stand and walk.   ? Pertinent History hearing impairment, low back pain   ? Limitations Standing;Walking   ? How long can you walk comfortably? to his mailbox   ? Patient Stated Goals improved balance, navigate stairs easier   ? Currently in Pain? No/denies   ? ?  ?  ? ?  ? ? ? ? ? [facility] PT Assessment - 10/19/21 0001   ? ?  ? Assessment  ? Medical Diagnosis Sequela of lacunar infarction   ? Referring Provider (PT) Lajuana Ripple, DO   ? Onset Date/Surgical Date --   about 6 months ago  ? Next MD Visit 04/17/22   ? Prior Therapy No   ?  ? Precautions  ? Precautions Fall   ?  ? Restrictions  ? Weight Bearing Restrictions No   ?  ? Balance Screen  ? Has the patient fallen in the past 6 months Yes   ? How many times? --   6-7 months ago  ? Has the patient had a decrease in activity level because of a fear of falling?  No   ? Is the patient reluctant to leave their home because of a fear of falling?  No   ?  ? Home Environment  ? Living Environment Private residence   ? Type of Home House   ?  Home Access Stairs to enter;Ramped entrance   ? Entrance Stairs-Number of Steps 0-5   ? Entrance Stairs-Rails Can reach both   ? Home Layout One level   ? Home Equipment None;Cane - single point   ?  ? Prior Function  ? Level of Independence Independent   ? Vocation Retired   ? Leisure go shopping and travelling,   ?  ? Cognition  ? Overall Cognitive Status Within Functional Limits for tasks assessed   ? Memory Appears intact   ? Awareness Appears intact   ? Problem Solving Appears intact   ?  ? Sensation  ? Additional Comments Patient reports no numbness or tingling   ?  ? ROM / Strength  ? AROM / PROM / Strength Strength   ?  ? Strength  ? Strength Assessment Site Hip;Knee;Ankle   ? Right/Left Hip Right;Left   ? Right Hip Flexion 4/5   ? Left Hip Flexion 4/5   ? Right/Left Knee Right;Left   ? Right Knee Flexion 5/5   ? Right Knee Extension 5/5   ? Left Knee Flexion 4+/5   ? Left Knee Extension 5/5   ? Right/Left Ankle Right;Left   ?  Right Ankle Dorsiflexion 4+/5   ? Left Ankle Dorsiflexion 4/5   ?  ? Transfers  ? Transfers Sit to Stand;Stand to Sit   ? Sit to Stand 6: Modified independent (Device/Increase time);Without upper extremity assist   ? Five time sit to stand comments  19 seconds   ? Stand to Sit 6: Modified independent (Device/Increase time);Without upper extremity assist   ?  ? Ambulation/Gait  ? Ambulation/Gait Yes   ? Ambulation/Gait Assistance 6: Modified independent (Device/Increase time)   ? Assistive device None   ? Gait Pattern Step-through pattern;Decreased stride length;Wide base of support   ? Ambulation Surface Level;Indoor   ? Gait velocity decreased   ?  ? Balance  ? Balance Assessed Yes   ?  ? Static Standing Balance  ? Static Standing Balance -  Activities  Romberg - Eyes Opened;Romberg - Eyes Closed;Tandam Stance - Left Leg;Romberg - Eyes Opened, Foam;Romberg - Eyes Closed , Foam;Tandam Stance - Right Leg   ? Static Standing - Comment/# of Minutes Rhomberg: 30 seconds (EO on and off foam and EC), Tandem: unable to perform; 12 seconds on foam with EC (rhomberg)   ?  ? Standardized Balance Assessment  ? Standardized Balance Assessment Five Times Sit to Stand   ? Five times sit to stand comments  19 seconds   ? ?  ?  ? ?  ? ? ? ? ? ? ? ? ? ? ? ? ? ?Objective measurements completed on examination: See above findings.  ? ? ? ? ? ? ? ? ? ? ? ? ? ? ? ? ? ? ? PT Long Term Goals - 10/19/21 1545   ? ?  ? PT LONG TERM GOAL #1  ? Title Patient will be independent with his HEP.   ? Time 6   ? Period Weeks   ? Status New   ? Target Date 11/30/21   ?  ? PT LONG TERM GOAL #2  ? Title Patient will be able to safely navigate at least 4 steps with a reciprocal pattern for improved function getting into and out of his house.   ? Time 6   ? Period Weeks   ? Status New   ? Target Date 11/30/21   ?  ? PT  LONG TERM GOAL #3  ? Title Patient will improve his five time sit to stand time to 12 seconds or less for improved lower extremity power  and reduced fall risk.   ? Time 6   ? Period Weeks   ? Status New   ? Target Date 11/30/21   ? ?  ?  ? ?  ? ? ? ? ? ? ? ? ? Plan - 10/19/21 1531   ? ? Clinical Impression Statement Patient is a 66 year old male presenting to physical therapy with increased instability following his CVA approximately 6 months ago. He exhibited mild deficits in BLE strength and power as evidenced by his strength testing and five time sit to stand time. He exhibited reduced static balance when on unstable surfaces with his eyes closed. Recommend that he continue with skilled physical therapy to address his remaining impairments to maximize his safety and functional mobiltiy.   ? Personal Factors and Comorbidities Time since onset of injury/illness/exacerbation;Comorbidity 3+   ? Comorbidities HTN, AAA, history of stroke, low back pain   ? Examination-Activity Limitations Locomotion Level;Transfers;Stairs   ? Examination-Participation Restrictions Community Activity;Other   ? Stability/Clinical Decision Making Evolving/Moderate complexity   ? Clinical Decision Making Moderate   ? Rehab Potential Good   ? PT Frequency 2x / week   ? PT Duration 6 weeks   ? PT Treatment/Interventions ADLs/Self Care Home Management;Neuromuscular re-education;Balance training;Therapeutic exercise;Therapeutic activities;Functional mobility training;Stair training;Gait training;Patient/family education   ? PT Next Visit Plan nustep, lower extremity strength and balance interventions   ? Consulted and Agree with Plan of Care Patient   ? ?  ?  ? ?  ? ? ?Patient will benefit from skilled therapeutic intervention in order to improve the following deficits and impairments:  Abnormal gait, Difficulty walking, Decreased endurance, Decreased activity tolerance, Decreased balance, Decreased strength, Decreased mobility ? ?Visit Diagnosis: ?Other abnormalities of gait and mobility ? ? ? ? ?Problem List ?Patient Active Problem List  ? Diagnosis Date Noted  ? Abdominal  aortic aneurysm (AAA) greater than 5.5 cm in diameter in male [facility]) 08/21/2021  ? Bilateral sensorineural hearing loss 04/03/2021  ? Loose stools 12/13/2019  ? Change in bowel habits 12/13/2019  ? Colon ca

## 2021-10-23 ENCOUNTER — Other Ambulatory Visit (INDEPENDENT_AMBULATORY_CARE_PROVIDER_SITE_OTHER): Payer: Medicare HMO

## 2021-10-23 DIAGNOSIS — Z125 Encounter for screening for malignant neoplasm of prostate: Secondary | ICD-10-CM

## 2021-10-23 DIAGNOSIS — Z95828 Presence of other vascular implants and grafts: Secondary | ICD-10-CM

## 2021-10-23 DIAGNOSIS — I10 Essential (primary) hypertension: Secondary | ICD-10-CM

## 2021-10-24 ENCOUNTER — Ambulatory Visit: Payer: Medicare HMO

## 2021-10-24 ENCOUNTER — Other Ambulatory Visit (HOSPITAL_COMMUNITY): Payer: Self-pay

## 2021-10-24 DIAGNOSIS — R2689 Other abnormalities of gait and mobility: Secondary | ICD-10-CM

## 2021-10-24 DIAGNOSIS — I6930 Unspecified sequelae of cerebral infarction: Secondary | ICD-10-CM | POA: Diagnosis not present

## 2021-10-24 LAB — LIPID PANEL
Chol/HDL Ratio: 3.7 ratio (ref 0.0–5.0)
Cholesterol, Total: 158 mg/dL (ref 100–199)
HDL: 43 mg/dL (ref >39)
LDL Chol Calc (NIH): 93 mg/dL (ref 0–99)
Triglycerides: 125 mg/dL (ref 0–149)
VLDL Cholesterol Cal: 22 mg/dL (ref 5–40)

## 2021-10-24 LAB — CMP14+EGFR
ALT: 24 IU/L (ref 0–44)
AST: 15 IU/L (ref 0–40)
Albumin/Globulin Ratio: 1.7 (ref 1.2–2.2)
Albumin: 4 g/dL (ref 3.8–4.8)
Alkaline Phosphatase: 109 IU/L (ref 44–121)
BUN/Creatinine Ratio: 13 (ref 10–24)
BUN: 14 mg/dL (ref 8–27)
Bilirubin Total: 0.3 mg/dL (ref 0.0–1.2)
CO2: 22 mmol/L (ref 20–29)
Calcium: 9.5 mg/dL (ref 8.6–10.2)
Chloride: 103 mmol/L (ref 96–106)
Creatinine, Ser: 1.09 mg/dL (ref 0.76–1.27)
Globulin, Total: 2.3 g/dL (ref 1.5–4.5)
Glucose: 99 mg/dL (ref 70–99)
Potassium: 4.7 mmol/L (ref 3.5–5.2)
Sodium: 139 mmol/L (ref 134–144)
Total Protein: 6.3 g/dL (ref 6.0–8.5)
eGFR: 74 mL/min/{1.73_m2} (ref >59)

## 2021-10-24 LAB — TSH: TSH: 3.04 u[IU]/mL (ref 0.450–4.500)

## 2021-10-24 LAB — PSA: Prostate Specific Ag, Serum: 2.3 ng/mL (ref 0.0–4.0)

## 2021-10-24 NOTE — Therapy (Signed)
[place] ?[facility] ?[address] ?[place], [place], [address] ?Phone: [phone]   Fax:  [phone] ? ?Physical Therapy Treatment ? ?Patient Details  ?Name: Lawrence Bennett ?MRN: [id number] ?Date of Birth: 1956-05-10 ?Referring Provider (PT): Lajuana Ripple, DO ? ? ?Encounter Date: 10/24/2021 ? ? PT End of Session - 10/24/21 1428   ? ? Visit Number 2   ? Number of Visits 12   ? Date for PT Re-Evaluation 01/05/22   ? PT Start Time 1430   ? PT Stop Time 1515   ? PT Time Calculation (min) 45 min   ? Activity Tolerance Patient tolerated treatment well   ? Behavior During Therapy [facility] for tasks assessed/performed   ? ?  ?  ? ?  ? ? ?Past Medical History:  ?Diagnosis Date  ? AAA (abdominal aortic aneurysm) ([place])   ? Anxiety   ? Atherosclerosis of aorta ([place])   ? Atrial contractions, premature   ? Depression   ? Dyslipidemia   ? Dyspnea   ? Elevated glucose 11/11/2015  ? Hyperlipidemia   ? Mild hypertension   ? Stroke [facility])   ? Urinary hesitancy   ? ? ?Past Surgical History:  ?Procedure Laterality Date  ? ABDOMINAL AORTIC ENDOVASCULAR STENT GRAFT Bilateral 08/21/2021  ? Procedure: ENDOVASCULAR AORTIC STENT GRAFT REPAIR;  Surgeon: Broadus John, MD;  [facility];  Service: Vascular;  Laterality: Bilateral;  ? COLON RESECTION  2012  ? ULTRASOUND GUIDANCE FOR VASCULAR ACCESS Bilateral 08/21/2021  ? Procedure: ULTRASOUND GUIDANCE FOR VASCULAR ACCESS;  Surgeon: Broadus John, MD;  Location: [place];  Service: Vascular;  Laterality: Bilateral;  ? ? ?There were no vitals filed for this visit. ? ? Subjective Assessment - 10/24/21 1427   ? ? Subjective Patient reports that he feels alright today.   ? Pertinent History hearing impairment, low back pain   ? Limitations Standing;Walking   ? How long can you walk comfortably? to his mailbox   ? Patient Stated Goals improved balance, navigate stairs easier   ? Currently in Pain? No/denies   ? ?  ?  ? ?  ? ? ? ? ? ? ? ? ? ? ? ? ? ? ? ? ? ? ? ? [place]  Adult PT Treatment/Exercise - 10/24/21 0001   ? ?  ? Exercises  ? Exercises Knee/Hip   ?  ? Knee/Hip Exercises: Aerobic  ? Nustep L3-5 x 10 minutes   ?  ? Knee/Hip Exercises: Machines for Strengthening  ? Cybex Knee Extension 40# x 2 minutes   ? Cybex Knee Flexion 60# x 2 minutes   ?  ? Knee/Hip Exercises: Standing  ? Forward Step Up Both;20 reps;Hand Hold: 2;Step Height: 8"   ?  ? Knee/Hip Exercises: Seated  ? Sit to Sand without UE support;10 reps   ? ?  ?  ? ?  ? ? ? ? ? ? Balance Exercises - 10/24/21 0001   ? ?  ? Balance Exercises: Standing  ? Retro Gait Upper extremity support   2 minutes  ? Sidestepping Upper extremity support   2 minutes  ? Marching Foam/compliant surface;Upper extremity assist 1;Static   2x 2 minutes  ? ?  ?  ? ?  ? ? ? ? ? ? ? ? ? ? PT Long Term Goals - 10/19/21 1545   ? ?  ? PT LONG TERM GOAL #1  ? Title Patient will be independent with his HEP.   ? Time  6   ? Period Weeks   ? Status New   ? Target Date 11/30/21   ?  ? PT LONG TERM GOAL #2  ? Title Patient will be able to safely navigate at least 4 steps with a reciprocal pattern for improved function getting into and out of his house.   ? Time 6   ? Period Weeks   ? Status New   ? Target Date 11/30/21   ?  ? PT LONG TERM GOAL #3  ? Title Patient will improve his five time sit to stand time to 12 seconds or less for improved lower extremity power and reduced fall risk.   ? Time 6   ? Period Weeks   ? Status New   ? Target Date 11/30/21   ? ?  ?  ? ?  ? ? ? ? ? ? ? ? Plan - 10/24/21 1428   ? ? Clinical Impression Statement Patient was introduced to multiple new interventions for improved lower extremity and stability. He required minimal cueing with today's interventions for proper biomechanics and pacing throughout to facilitate proper muscular engagement while preventing a significant increase in lower extremity fatigue. He required upper extremity support with today's standing interventions for improved stability. He was provided an HEP  which he was able to properly and safely demonstrate. He reported feeling like he was a little better upon the conclusion of treatment. He continues to require skilled physical therapy to address his remaining impairments to maximize his safety and functional mobility.   ? Personal Factors and Comorbidities Time since onset of injury/illness/exacerbation;Comorbidity 3+   ? Comorbidities HTN, AAA, history of stroke, low back pain   ? Examination-Activity Limitations Locomotion Level;Transfers;Stairs   ? Examination-Participation Restrictions Community Activity;Other   ? Stability/Clinical Decision Making Evolving/Moderate complexity   ? Rehab Potential Good   ? PT Frequency 2x / week   ? PT Duration 6 weeks   ? PT Treatment/Interventions ADLs/Self Care Home Management;Neuromuscular re-education;Balance training;Therapeutic exercise;Therapeutic activities;Functional mobility training;Stair training;Gait training;Patient/family education   ? PT Next Visit Plan nustep, lower extremity strength and balance interventions   ? Consulted and Agree with Plan of Care Patient   ? ?  ?  ? ?  ? ? ?Patient will benefit from skilled therapeutic intervention in order to improve the following deficits and impairments:  Abnormal gait, Difficulty walking, Decreased endurance, Decreased activity tolerance, Decreased balance, Decreased strength, Decreased mobility ? ?Visit Diagnosis: ?Other abnormalities of gait and mobility ? ? ? ? ?Problem List ?Patient Active Problem List  ? Diagnosis Date Noted  ? Abdominal aortic aneurysm (AAA) greater than 5.5 cm in diameter in male [facility]) 08/21/2021  ? Bilateral sensorineural hearing loss 04/03/2021  ? Loose stools 12/13/2019  ? Change in bowel habits 12/13/2019  ? Colon cancer screening 12/13/2019  ? Altered bowel function 10/30/2019  ? Onychomycosis of toenail 08/26/2018  ? Pre-diabetes 07/25/2017  ? History of stroke 03/23/2016  ? HLD (hyperlipidemia) 12/20/2015  ? BPH (benign prostatic  hyperplasia) 11/11/2015  ? Healthcare maintenance 11/11/2015  ? Major depressive disorder, recurrent episode, moderate ([place])   ? Diverticulosis of colon without hemorrhage 05/02/2014  ? Essential hypertension 04/15/2014  ? Anxiety 04/15/2014  ? Hearing loss 04/15/2014  ? Dizziness 04/15/2014  ? ? ?Darlin Coco, PT ?10/24/2021, 5:05 PM ? ?[place] ?[facility] ?[address] ?[place], [place], [address] ?Phone: [phone]   Fax:  [phone] ? ?Name: Lawrence Bennett ?MRN: [id number] ?Date of Birth: April 24, 1956 ? ? ? ?

## 2021-10-26 ENCOUNTER — Ambulatory Visit: Payer: Medicare HMO | Admitting: Physical Therapy

## 2021-10-26 ENCOUNTER — Encounter: Payer: Self-pay | Admitting: Physical Therapy

## 2021-10-26 DIAGNOSIS — R2689 Other abnormalities of gait and mobility: Secondary | ICD-10-CM

## 2021-10-26 DIAGNOSIS — I6930 Unspecified sequelae of cerebral infarction: Secondary | ICD-10-CM | POA: Diagnosis not present

## 2021-10-26 NOTE — Therapy (Signed)
[place] ?[facility] ?[place] ?[place], [place], [address] ?Phone: [phone]   Fax:  [phone] ? ?Physical Therapy Treatment ? ?Patient Details  ?Name: Lawrence Bennett ?MRN: [id number] ?Date of Birth: 1956-03-16 ?Referring Provider (PT): Lajuana Ripple, DO ? ? ?Encounter Date: 10/26/2021 ? ? PT End of Session - 10/26/21 1427   ? ? Visit Number 3   ? Number of Visits 12   ? Date for PT Re-Evaluation 01/05/22   ? PT Start Time 1430   ? PT Stop Time 0109   ? PT Time Calculation (min) 46 min   ? Activity Tolerance Patient tolerated treatment well   ? Behavior During Therapy [facility] for tasks assessed/performed   ? ?  ?  ? ?  ? ? ?Past Medical History:  ?Diagnosis Date  ? AAA (abdominal aortic aneurysm) ([place])   ? Anxiety   ? Atherosclerosis of aorta ([place])   ? Atrial contractions, premature   ? Depression   ? Dyslipidemia   ? Dyspnea   ? Elevated glucose 11/11/2015  ? Hyperlipidemia   ? Mild hypertension   ? Stroke [facility])   ? Urinary hesitancy   ? ? ?Past Surgical History:  ?Procedure Laterality Date  ? ABDOMINAL AORTIC ENDOVASCULAR STENT GRAFT Bilateral 08/21/2021  ? Procedure: ENDOVASCULAR AORTIC STENT GRAFT REPAIR;  Surgeon: Broadus John, MD;  Location: [facility] OR;  Service: Vascular;  Laterality: Bilateral;  ? COLON RESECTION  2012  ? ULTRASOUND GUIDANCE FOR VASCULAR ACCESS Bilateral 08/21/2021  ? Procedure: ULTRASOUND GUIDANCE FOR VASCULAR ACCESS;  Surgeon: Broadus John, MD;  Location: [place];  Service: Vascular;  Laterality: Bilateral;  ? ? ?There were no vitals filed for this visit. ? ? Subjective Assessment - 10/26/21 1426   ? ? Subjective Still challenged by stairs but is only in the back of the house which he uses only seldom.   ? Pertinent History hearing impairment, low back pain   ? Limitations Standing;Walking   ? How long can you walk comfortably? to his mailbox   ? Patient Stated Goals improved balance, navigate stairs easier   ? Currently in Pain? No/denies   ? ?  ?  ? ?   ? ? ? ? ? [facility] PT Assessment - 10/26/21 0001   ? ?  ? Assessment  ? Medical Diagnosis [place] of lacunar infarction   ? Referring Provider (PT) Lajuana Ripple, DO   ? Next MD Visit 04/17/22   ? Prior Therapy No   ?  ? Precautions  ? Precautions Fall   ?  ? Restrictions  ? Weight Bearing Restrictions No   ? ?  ?  ? ?  ? ? ? ? ? ? ? ? ? ? ? ? ? ? ? ? [place] Adult PT Treatment/Exercise - 10/26/21 0001   ? ?  ? Knee/Hip Exercises: Aerobic  ? Nustep L2 x10 min   ?  ? Knee/Hip Exercises: Machines for Strengthening  ? Cybex Knee Extension 30# x2 min   ? Cybex Knee Flexion 50# x2 min   ?  ? Knee/Hip Exercises: Standing  ? Heel Raises Both;20 reps   ? Heel Raises Limitations B toe raise x20 reps   ? Hip Abduction Stengthening;Both;15 reps;Knee straight;Limitations   ? Abduction Limitations 3#   ? Forward Step Up Both;20 reps;Hand Hold: 2;Step Height: 8"   ?  ? Knee/Hip Exercises: Seated  ? Sit to Sand without UE support;20 reps   ? ?  ?  ? ?  ? ? ? ? ? ?  Balance Exercises - 10/26/21 0001   ? ?  ? Balance Exercises: Standing  ? Standing Eyes Opened Narrow base of support (BOS);Wide (BOA);Foam/compliant surface;Time   ? Standing Eyes Opened Time x2 min each   ? Tandem Stance Eyes open;Foam/compliant surface;Intermittent upper extremity support;Time;Cognitive challenge   ? Tandem Stance Limitations 2 min; with headturns   ? Balance Beam DLS on horizontal beam x2 min   ? ?  ?  ? ?  ? ? ? ? ? ? ? ? ? ? PT Long Term Goals - 10/19/21 1545   ? ?  ? PT LONG TERM GOAL #1  ? Title Patient will be independent with his HEP.   ? Time 6   ? Period Weeks   ? Status New   ? Target Date 11/30/21   ?  ? PT LONG TERM GOAL #2  ? Title Patient will be able to safely navigate at least 4 steps with a reciprocal pattern for improved function getting into and out of his house.   ? Time 6   ? Period Weeks   ? Status New   ? Target Date 11/30/21   ?  ? PT LONG TERM GOAL #3  ? Title Patient will improve his five time sit to stand time to 12 seconds or less  for improved lower extremity power and reduced fall risk.   ? Time 6   ? Period Weeks   ? Status New   ? Target Date 11/30/21   ? ?  ?  ? ?  ? ? ? ? ? ? ? ? Plan - 10/26/21 1544   ? ? Clinical Impression Statement Patient presented in clinic with reports of no pain and only limited with stairs and uneven surfaces such as the yard. Patient progressed through LE strengthening with minimal limitation. Appropriate ankle strategy noted with uneven surfaces on airexes and with head turns. Patient also instructed in [place] or locals gyms in which to maintain PT gains.   ? Personal Factors and Comorbidities Time since onset of injury/illness/exacerbation;Comorbidity 3+   ? Comorbidities HTN, AAA, history of stroke, low back pain   ? Examination-Activity Limitations Locomotion Level;Transfers;Stairs   ? Examination-Participation Restrictions Community Activity;Other   ? Stability/Clinical Decision Making Evolving/Moderate complexity   ? Rehab Potential Good   ? PT Frequency 2x / week   ? PT Duration 6 weeks   ? PT Treatment/Interventions ADLs/Self Care Home Management;Neuromuscular re-education;Balance training;Therapeutic exercise;Therapeutic activities;Functional mobility training;Stair training;Gait training;Patient/family education   ? PT Next Visit Plan nustep, lower extremity strength and balance interventions   ? Consulted and Agree with Plan of Care Patient   ? ?  ?  ? ?  ? ? ?Patient will benefit from skilled therapeutic intervention in order to improve the following deficits and impairments:  Abnormal gait, Difficulty walking, Decreased endurance, Decreased activity tolerance, Decreased balance, Decreased strength, Decreased mobility ? ?Visit Diagnosis: ?Other abnormalities of gait and mobility ? ? ? ? ?Problem List ?Patient Active Problem List  ? Diagnosis Date Noted  ? Abdominal aortic aneurysm (AAA) greater than 5.5 cm in diameter in male [facility]) 08/21/2021  ? Bilateral sensorineural hearing loss  04/03/2021  ? Loose stools 12/13/2019  ? Change in bowel habits 12/13/2019  ? Colon cancer screening 12/13/2019  ? Altered bowel function 10/30/2019  ? Onychomycosis of toenail 08/26/2018  ? Pre-diabetes 07/25/2017  ? History of stroke 03/23/2016  ? HLD (hyperlipidemia) 12/20/2015  ? BPH (benign prostatic hyperplasia) 11/11/2015  ? Healthcare  maintenance 11/11/2015  ? Major depressive disorder, recurrent episode, moderate ([employer])   ? Diverticulosis of colon without hemorrhage 05/02/2014  ? Essential hypertension 04/15/2014  ? Anxiety 04/15/2014  ? Hearing loss 04/15/2014  ? Dizziness 04/15/2014  ? ? ?Standley Brooking, PTA ?10/26/2021, 3:53 PM ? ?[place] ?[facility] ?[place] [place] ?[place], [place], [address] ?Phone: [phone]   Fax:  [phone] ? ?Name: Lawrence Bennett ?MRN: [id number] ?Date of Birth: Nov 26, 1955 ? ? ? ?

## 2021-11-02 ENCOUNTER — Ambulatory Visit: Payer: Medicare HMO | Attending: Family Medicine

## 2021-11-02 DIAGNOSIS — R2689 Other abnormalities of gait and mobility: Secondary | ICD-10-CM | POA: Diagnosis not present

## 2021-11-02 NOTE — Therapy (Signed)
[place] ?[facility] ?[address] ?[place], [place], [address] ?Phone: [phone]   Fax:  [phone] ? ?Physical Therapy Treatment ? ?Patient Details  ?Name: Lawrence Bennett ?MRN: [id number] ?Date of Birth: 09/05/1955 ?Referring Provider (PT): Lajuana Ripple, DO ? ? ?Encounter Date: 11/02/2021 ? ? PT End of Session - 11/02/21 1444   ? ? Visit Number 4   ? Number of Visits 12   ? Date for PT Re-Evaluation 01/05/22   ? PT Start Time 1430   ? PT Stop Time 8101   ? PT Time Calculation (min) 48 min   ? Activity Tolerance Patient tolerated treatment well   ? Behavior During Therapy [facility] for tasks assessed/performed   ? ?  ?  ? ?  ? ? ?Past Medical History:  ?Diagnosis Date  ? AAA (abdominal aortic aneurysm) ([place])   ? Anxiety   ? Atherosclerosis of aorta ([place])   ? Atrial contractions, premature   ? Depression   ? Dyslipidemia   ? Dyspnea   ? Elevated glucose 11/11/2015  ? Hyperlipidemia   ? Mild hypertension   ? Stroke [facility])   ? Urinary hesitancy   ? ? ?Past Surgical History:  ?Procedure Laterality Date  ? ABDOMINAL AORTIC ENDOVASCULAR STENT GRAFT Bilateral 08/21/2021  ? Procedure: ENDOVASCULAR AORTIC STENT GRAFT REPAIR;  Surgeon: Broadus John, MD;  Location: [facility] OR;  Service: Vascular;  Laterality: Bilateral;  ? COLON RESECTION  2012  ? ULTRASOUND GUIDANCE FOR VASCULAR ACCESS Bilateral 08/21/2021  ? Procedure: ULTRASOUND GUIDANCE FOR VASCULAR ACCESS;  Surgeon: Broadus John, MD;  Location: [place];  Service: Vascular;  Laterality: Bilateral;  ? ? ?There were no vitals filed for this visit. ? ? Subjective Assessment - 11/02/21 1444   ? ? Subjective Patient reported that they feel alright today. However, the stiars still feel the same.   ? Pertinent History hearing impairment, low back pain   ? Limitations Standing;Walking   ? How long can you walk comfortably? to his mailbox   ? Patient Stated Goals improved balance, navigate stairs easier   ? Currently in Pain? No/denies   ? ?  ?  ? ?   ? ? ? ? ? ? ? ? ? ? ? ? ? ? ? ? ? ? ? ? [place] Adult PT Treatment/Exercise - 11/02/21 0001   ? ?  ? Knee/Hip Exercises: Aerobic  ? Nustep L4 x 17 minutes   ?  ? Knee/Hip Exercises: Machines for Strengthening  ? Cybex Knee Extension 40# x 2 minutes   ? Cybex Knee Flexion 60# x 2 minutes   ?  ? Knee/Hip Exercises: Standing  ? Lateral Step Up Both;20 reps;Hand Hold: 2;Step Height: 6"   ? Rocker Board 5 minutes   ? ?  ?  ? ?  ? ? ? ? ? ? Balance Exercises - 11/02/21 0001   ? ?  ? Balance Exercises: Standing  ? Step Ups Forward;UE support 2   onto BOSU (ball up) 20 reps each  ? Other Standing Exercises BOSU lateral rocking   2 minutes  ? ?  ?  ? ?  ? ? ? ? ? PT Education - 11/02/21 1551   ? ? Education Details healing, benefits of continued exercise outside of therapy   ? Person(s) Educated Patient   ? Methods Explanation   ? Comprehension Verbalized understanding   ? ?  ?  ? ?  ? ? ? ? ? ? PT Long Term Goals -  10/19/21 1545   ? ?  ? PT LONG TERM GOAL #1  ? Title Patient will be independent with his HEP.   ? Time 6   ? Period Weeks   ? Status New   ? Target Date 11/30/21   ?  ? PT LONG TERM GOAL #2  ? Title Patient will be able to safely navigate at least 4 steps with a reciprocal pattern for improved function getting into and out of his house.   ? Time 6   ? Period Weeks   ? Status New   ? Target Date 11/30/21   ?  ? PT LONG TERM GOAL #3  ? Title Patient will improve his five time sit to stand time to 12 seconds or less for improved lower extremity power and reduced fall risk.   ? Time 6   ? Period Weeks   ? Status New   ? Target Date 11/30/21   ? ?  ?  ? ?  ? ? ? ? ? ? ? ? Plan - 11/02/21 1444   ? ? Clinical Impression Statement Patient was progressed with multiple familiar interventions for improved lower extremity strength and stability. He required minimal cueing with BOSU lateral rocking for improved weight distrubution to prevent a posterior weight shift. He required occasional brief rest breaks throughout  treatment, but he was able to maintain proper biomechanics on following interventions. He reported feeling a little tired upon the conclusion of treatment. He continues to require skilled physical therapy to address his remaining impairments to return to his prior level of function.   ? Personal Factors and Comorbidities Time since onset of injury/illness/exacerbation;Comorbidity 3+   ? Comorbidities HTN, AAA, history of stroke, low back pain   ? Examination-Activity Limitations Locomotion Level;Transfers;Stairs   ? Examination-Participation Restrictions Community Activity;Other   ? Stability/Clinical Decision Making Evolving/Moderate complexity   ? Rehab Potential Good   ? PT Frequency 2x / week   ? PT Duration 6 weeks   ? PT Treatment/Interventions ADLs/Self Care Home Management;Neuromuscular re-education;Balance training;Therapeutic exercise;Therapeutic activities;Functional mobility training;Stair training;Gait training;Patient/family education   ? PT Next Visit Plan nustep, lower extremity strength and balance interventions   ? Consulted and Agree with Plan of Care Patient   ? ?  ?  ? ?  ? ? ?Patient will benefit from skilled therapeutic intervention in order to improve the following deficits and impairments:  Abnormal gait, Difficulty walking, Decreased endurance, Decreased activity tolerance, Decreased balance, Decreased strength, Decreased mobility ? ?Visit Diagnosis: ?Other abnormalities of gait and mobility ? ? ? ? ?Problem List ?Patient Active Problem List  ? Diagnosis Date Noted  ? Abdominal aortic aneurysm (AAA) greater than 5.5 cm in diameter in male [facility]) 08/21/2021  ? Bilateral sensorineural hearing loss 04/03/2021  ? Loose stools 12/13/2019  ? Change in bowel habits 12/13/2019  ? Colon cancer screening 12/13/2019  ? Altered bowel function 10/30/2019  ? Onychomycosis of toenail 08/26/2018  ? Pre-diabetes 07/25/2017  ? History of stroke 03/23/2016  ? HLD (hyperlipidemia) 12/20/2015  ? BPH (benign  prostatic hyperplasia) 11/11/2015  ? Healthcare maintenance 11/11/2015  ? Major depressive disorder, recurrent episode, moderate ([place])   ? Diverticulosis of colon without hemorrhage 05/02/2014  ? Essential hypertension 04/15/2014  ? Anxiety 04/15/2014  ? Hearing loss 04/15/2014  ? Dizziness 04/15/2014  ? ? ?Darlin Coco, PT ?11/02/2021, 3:55 PM ? ?[place] ?[facility] ?[address] ?[place], [place], [address] ?Phone: [phone]   Fax:  [phone] ? ?  Name: LONZO SAULTER ?MRN: [id number] ?Date of Birth: Aug 18, 1955 ? ? ? ?

## 2021-11-03 ENCOUNTER — Other Ambulatory Visit (HOSPITAL_COMMUNITY): Payer: Self-pay

## 2021-11-07 ENCOUNTER — Ambulatory Visit: Payer: Medicare HMO | Admitting: *Deleted

## 2021-11-07 DIAGNOSIS — R2689 Other abnormalities of gait and mobility: Secondary | ICD-10-CM

## 2021-11-07 NOTE — Therapy (Signed)
[place] ?[facility] ?[address] ?[place], [place], [address] ?Phone: [phone]   Fax:  [phone] ? ?Physical Therapy Treatment ? ?Patient Details  ?Name: Lawrence Bennett ?MRN: [id number] ?Date of Birth: 1956/03/15 ?Referring Provider (PT): Lajuana Ripple, DO ? ? ?Encounter Date: 11/07/2021 ? ? PT End of Session - 11/07/21 1436   ? ? Visit Number 5   ? Number of Visits 12   ? Date for PT Re-Evaluation 01/05/22   ? PT Start Time 1430   ? PT Stop Time 1520   ? PT Time Calculation (min) 50 min   ? ?  ?  ? ?  ? ? ?Past Medical History:  ?Diagnosis Date  ? AAA (abdominal aortic aneurysm) ([place])   ? Anxiety   ? Atherosclerosis of aorta ([place])   ? Atrial contractions, premature   ? Depression   ? Dyslipidemia   ? Dyspnea   ? Elevated glucose 11/11/2015  ? Hyperlipidemia   ? Mild hypertension   ? Stroke [facility])   ? Urinary hesitancy   ? ? ?Past Surgical History:  ?Procedure Laterality Date  ? ABDOMINAL AORTIC ENDOVASCULAR STENT GRAFT Bilateral 08/21/2021  ? Procedure: ENDOVASCULAR AORTIC STENT GRAFT REPAIR;  Surgeon: Broadus John, MD;  Location: [facility] OR;  Service: Vascular;  Laterality: Bilateral;  ? COLON RESECTION  2012  ? ULTRASOUND GUIDANCE FOR VASCULAR ACCESS Bilateral 08/21/2021  ? Procedure: ULTRASOUND GUIDANCE FOR VASCULAR ACCESS;  Surgeon: Broadus John, MD;  Location: [facility];  Service: Vascular;  Laterality: Bilateral;  ? ? ?There were no vitals filed for this visit. ? ? Subjective Assessment - 11/07/21 1434   ? ? Subjective Patient reported that they feel alright today. Did okay after last Rx   ? Pertinent History hearing impairment, low back pain   ? Limitations Standing;Walking   ? Patient Stated Goals improved balance, navigate stairs easier   ? Currently in Pain? No/denies   ? ?  ?  ? ?  ? ? ? ? ? ? ? ? ? ? ? ? ? ? ? ? ? ? ? ? [place] Adult PT Treatment/Exercise - 11/07/21 0001   ? ?  ? Exercises  ? Exercises Knee/Hip   ?  ? Knee/Hip Exercises: Aerobic  ? Nustep L4 x 17 minutes    ?  ? Knee/Hip Exercises: Machines for Strengthening  ? Cybex Knee Extension 30# 3xfatigue minutes   ? Cybex Knee Flexion 50#  3 x fatigue   ?  ? Knee/Hip Exercises: Standing  ? Heel Raises Both;20 reps;2 sets;15 reps   ? Heel Raises Limitations B toe raise2 x15-20 reps   ? Hip Abduction Stengthening;Both;15 reps;Knee straight;Limitations;2 sets   ? Rocker Board 5 minutes   ? ?  ?  ? ?  ? ? ? ? ? ? Balance Exercises - 11/07/21 0001   ? ?  ? Balance Exercises: Standing  ? Standing Eyes Opened Narrow base of support (BOS);Foam/compliant surface;Time;Cognitive challenge   ? Tandem Stance Eyes open;Foam/compliant surface;Intermittent upper extremity support;Time;Cognitive challenge   ? Rockerboard Anterior/posterior   4 mins  ? Balance Beam side stepping x 5 mins  UE assist PRN   ? ?  ?  ? ?  ? ? ? ? ? ? ? ? ? ? PT Long Term Goals - 10/19/21 1545   ? ?  ? PT LONG TERM GOAL #1  ? Title Patient will be independent with his HEP.   ? Time 6   ? Period Weeks   ?  Status New   ? Target Date 11/30/21   ?  ? PT LONG TERM GOAL #2  ? Title Patient will be able to safely navigate at least 4 steps with a reciprocal pattern for improved function getting into and out of his house.   ? Time 6   ? Period Weeks   ? Status New   ? Target Date 11/30/21   ?  ? PT LONG TERM GOAL #3  ? Title Patient will improve his five time sit to stand time to 12 seconds or less for improved lower extremity power and reduced fall risk.   ? Time 6   ? Period Weeks   ? Status New   ? Target Date 11/30/21   ? ?  ?  ? ?  ? ? ? ? ? ? ? ? Plan - 11/07/21 1525   ? ? Clinical Impression Statement Pt arrived today doing fairly well.Rx focused on LE strengthening as well as balance and proprioception exs. Pt did well with static NB balance, but was challenged with static tandem stance as well as sise stepping on balance beam.   ? Personal Factors and Comorbidities Time since onset of injury/illness/exacerbation;Comorbidity 3+   ? Comorbidities HTN, AAA, history of  stroke, low back pain   ? Examination-Activity Limitations Locomotion Level;Transfers;Stairs   ? Examination-Participation Restrictions Community Activity;Other   ? Stability/Clinical Decision Making Evolving/Moderate complexity   ? Rehab Potential Good   ? PT Frequency 2x / week   ? PT Duration 6 weeks   ? PT Treatment/Interventions ADLs/Self Care Home Management;Neuromuscular re-education;Balance training;Therapeutic exercise;Therapeutic activities;Functional mobility training;Stair training;Gait training;Patient/family education   ? PT Next Visit Plan nustep, lower extremity strength and balance interventions   ? Consulted and Agree with Plan of Care Patient   ? ?  ?  ? ?  ? ? ?Patient will benefit from skilled therapeutic intervention in order to improve the following deficits and impairments:  Abnormal gait, Difficulty walking, Decreased endurance, Decreased activity tolerance, Decreased balance, Decreased strength, Decreased mobility ? ?Visit Diagnosis: ?Other abnormalities of gait and mobility ? ? ? ? ?Problem List ?Patient Active Problem List  ? Diagnosis Date Noted  ? Abdominal aortic aneurysm (AAA) greater than 5.5 cm in diameter in male [facility]) 08/21/2021  ? Bilateral sensorineural hearing loss 04/03/2021  ? Loose stools 12/13/2019  ? Change in bowel habits 12/13/2019  ? Colon cancer screening 12/13/2019  ? Altered bowel function 10/30/2019  ? Onychomycosis of toenail 08/26/2018  ? Pre-diabetes 07/25/2017  ? History of stroke 03/23/2016  ? HLD (hyperlipidemia) 12/20/2015  ? BPH (benign prostatic hyperplasia) 11/11/2015  ? Healthcare maintenance 11/11/2015  ? Major depressive disorder, recurrent episode, moderate ([place])   ? Diverticulosis of colon without hemorrhage 05/02/2014  ? Essential hypertension 04/15/2014  ? Anxiety 04/15/2014  ? Hearing loss 04/15/2014  ? Dizziness 04/15/2014  ? ? ?Zuleyma Scharf,CHRIS, PTA ?11/07/2021, 3:34 PM ? ?[place] ?[facility] ?[place] ?[place], [place], [address] ?Phone: [phone]   Fax:  [phone] ? ?Name: Lawrence Bennett ?MRN: [id number] ?Date of Birth: 03/20/1956 ? ? ? ?

## 2021-11-09 ENCOUNTER — Ambulatory Visit: Payer: Medicare HMO | Admitting: *Deleted

## 2021-11-09 DIAGNOSIS — R2689 Other abnormalities of gait and mobility: Secondary | ICD-10-CM | POA: Diagnosis not present

## 2021-11-09 NOTE — Therapy (Signed)
[place] ?[facility] ?[place] ?[place], [place], [address] ?Phone: [phone]   Fax:  [phone] ? ?Physical Therapy Treatment ? ?Patient Details  ?Name: Lawrence Bennett ?MRN: [id number] ?Date of Birth: [birth year] ?Referring Provider (PT): Lajuana Ripple, DO ? ? ?Encounter Date: 11/09/2021 ? ? PT End of Session - 11/09/21 1719   ? ? Visit Number 6   ? Number of Visits 12   ? Date for PT Re-Evaluation 01/05/22   ? PT Start Time 1430   ? PT Stop Time 5462   ? PT Time Calculation (min) 49 min   ? ?  ?  ? ?  ? ? ?Past Medical History:  ?Diagnosis Date  ? AAA (abdominal aortic aneurysm) ([place])   ? Anxiety   ? Atherosclerosis of aorta ([place])   ? Atrial contractions, premature   ? Depression   ? Dyslipidemia   ? Dyspnea   ? Elevated glucose 11/11/2015  ? Hyperlipidemia   ? Mild hypertension   ? Stroke [facility])   ? Urinary hesitancy   ? ? ?Past Surgical History:  ?Procedure Laterality Date  ? ABDOMINAL AORTIC ENDOVASCULAR STENT GRAFT Bilateral 08/21/2021  ? Procedure: ENDOVASCULAR AORTIC STENT GRAFT REPAIR;  Surgeon: Broadus John, MD;  Location: [facility] OR;  Service: Vascular;  Laterality: Bilateral;  ? COLON RESECTION  2012  ? ULTRASOUND GUIDANCE FOR VASCULAR ACCESS Bilateral 08/21/2021  ? Procedure: ULTRASOUND GUIDANCE FOR VASCULAR ACCESS;  Surgeon: Broadus John, MD;  Location: [facility];  Service: Vascular;  Laterality: Bilateral;  ? ? ?There were no vitals filed for this visit. ? ? Subjective Assessment - 11/09/21 1427   ? ? Subjective Did good after last Rx   ? Pertinent History hearing impairment, low back pain   ? Limitations Standing;Walking   ? How long can you walk comfortably? to his mailbox   ? Patient Stated Goals improved balance, navigate stairs easier   ? Currently in Pain? No/denies   ? ?  ?  ? ?  ? ? ? ? ? ? ? ? ? ? ? ? ? ? ? ? ? ? ? ? [place] Adult PT Treatment/Exercise - 11/09/21 0001   ? ?  ? Exercises  ? Exercises Knee/Hip   ?  ? Knee/Hip Exercises: Aerobic  ? Nustep L4 x 17  minutes   ?  ? Knee/Hip Exercises: Machines for Strengthening  ? Cybex Knee Extension 30# 3xfatigue   ? Cybex Knee Flexion 50#  3 x fatigue   ?  ? Knee/Hip Exercises: Standing  ? Heel Raises Both;20 reps;2 sets;15 reps   ? Heel Raises Limitations B toe raise2 x15-20 reps   ? Hip Abduction Stengthening;Both;15 reps;Knee straight;Limitations;2 sets   ? ?  ?  ? ?  ? ? ? ? ? ? Balance Exercises - 11/09/21 0001   ? ?  ? Balance Exercises: Standing  ? Standing Eyes Opened Narrow base of support (BOS);Foam/compliant surface;Time;Cognitive challenge   ? Tandem Stance Eyes open;Foam/compliant surface;Intermittent upper extremity support;Time;Cognitive challenge   ? Rockerboard Anterior/posterior;Intermittent UE support   30mns  ? Balance Beam side stepping x 5 mins  UE assist PRN   ? ?  ?  ? ?  ? ? ? ? ? ? ? ? ? ? PT Long Term Goals - 10/19/21 1545   ? ?  ? PT LONG TERM GOAL #1  ? Title Patient will be independent with his HEP.   ? Time 6   ? Period Weeks   ?  Status New   ? Target Date 11/30/21   ?  ? PT LONG TERM GOAL #2  ? Title Patient will be able to safely navigate at least 4 steps with a reciprocal pattern for improved function getting into and out of his house.   ? Time 6   ? Period Weeks   ? Status New   ? Target Date 11/30/21   ?  ? PT LONG TERM GOAL #3  ? Title Patient will improve his five time sit to stand time to 12 seconds or less for improved lower extremity power and reduced fall risk.   ? Time 6   ? Period Weeks   ? Status New   ? Target Date 11/30/21   ? ?  ?  ? ?  ? ? ? ? ? ? ? ? Plan - 11/09/21 1511   ? ? Clinical Impression Statement Pt arrived today doing fairly well and was able to continue with LE strengthening as well as balance and prprioception exs. Much better with static tandem stance today.   ? Personal Factors and Comorbidities Time since onset of injury/illness/exacerbation;Comorbidity 3+   ? Comorbidities HTN, AAA, history of stroke, low back pain   ? Examination-Activity Limitations  Locomotion Level;Transfers;Stairs   ? Stability/Clinical Decision Making Evolving/Moderate complexity   ? Rehab Potential Good   ? PT Frequency 2x / week   ? PT Duration 6 weeks   ? PT Treatment/Interventions ADLs/Self Care Home Management;Neuromuscular re-education;Balance training;Therapeutic exercise;Therapeutic activities;Functional mobility training;Stair training;Gait training;Patient/family education   ? PT Next Visit Plan nustep, lower extremity strength and balance interventions   ? ?  ?  ? ?  ? ? ?Patient will benefit from skilled therapeutic intervention in order to improve the following deficits and impairments:    ? ?Visit Diagnosis: ?Other abnormalities of gait and mobility ? ? ? ? ?Problem List ?Patient Active Problem List  ? Diagnosis Date Noted  ? Abdominal aortic aneurysm (AAA) greater than 5.5 cm in diameter in male [facility]) 08/21/2021  ? Bilateral sensorineural hearing loss 04/03/2021  ? Loose stools 12/13/2019  ? Change in bowel habits 12/13/2019  ? Colon cancer screening 12/13/2019  ? Altered bowel function 10/30/2019  ? Onychomycosis of toenail 08/26/2018  ? Pre-diabetes 07/25/2017  ? History of stroke 03/23/2016  ? HLD (hyperlipidemia) 12/20/2015  ? BPH (benign prostatic hyperplasia) 11/11/2015  ? Healthcare maintenance 11/11/2015  ? Major depressive disorder, recurrent episode, moderate ([place])   ? Diverticulosis of colon without hemorrhage 05/02/2014  ? Essential hypertension 04/15/2014  ? Anxiety 04/15/2014  ? Hearing loss 04/15/2014  ? Dizziness 04/15/2014  ? ? ?Charmaine Placido,CHRIS, PTA ?11/09/2021, 5:21 PM ? ?[place] ?[facility] ?[place] ?[place], [place], [address] ?Phone: [phone]   Fax:  [phone] ? ?Name: LIGE LAKEMAN ?MRN: [id number] ?Date of Birth: 07-31-1955 ? ? ? ?

## 2021-11-10 ENCOUNTER — Other Ambulatory Visit: Payer: Self-pay | Admitting: Family Medicine

## 2021-11-10 DIAGNOSIS — E782 Mixed hyperlipidemia: Secondary | ICD-10-CM

## 2021-11-13 ENCOUNTER — Other Ambulatory Visit (HOSPITAL_COMMUNITY): Payer: Self-pay

## 2021-11-13 MED ORDER — ATORVASTATIN CALCIUM 80 MG PO TABS
ORAL_TABLET | Freq: Every day | ORAL | 1 refills | Status: DC
  Filled 2021-11-13: qty 90, 90d supply, fill #0
  Filled 2022-02-07: qty 90, 90d supply, fill #1

## 2021-11-14 ENCOUNTER — Ambulatory Visit: Payer: Medicare HMO | Admitting: *Deleted

## 2021-11-14 DIAGNOSIS — R2689 Other abnormalities of gait and mobility: Secondary | ICD-10-CM | POA: Diagnosis not present

## 2021-11-14 NOTE — Therapy (Signed)
[place] ?[facility] ?[place] ?[place], [place], [address] ?Phone: [phone]   Fax:  [phone] ? ?Physical Therapy Treatment ? ?Patient Details  ?Name: Lawrence Bennett ?MRN: [id number] ?Date of Birth: 1955-08-01 ?Referring Provider (PT): Lajuana Ripple, DO ? ? ?Encounter Date: 11/14/2021 ? ? PT End of Session - 11/14/21 1438   ? ? Visit Number 7   ? Number of Visits 12   ? Date for PT Re-Evaluation 01/05/22   ? PT Start Time 1430   ? PT Stop Time 3295   ? PT Time Calculation (min) 47 min   ? ?  ?  ? ?  ? ? ?Past Medical History:  ?Diagnosis Date  ? AAA (abdominal aortic aneurysm) ([place])   ? Anxiety   ? Atherosclerosis of aorta ([place])   ? Atrial contractions, premature   ? Depression   ? Dyslipidemia   ? Dyspnea   ? Elevated glucose 11/11/2015  ? Hyperlipidemia   ? Mild hypertension   ? Stroke [facility])   ? Urinary hesitancy   ? ? ?Past Surgical History:  ?Procedure Laterality Date  ? ABDOMINAL AORTIC ENDOVASCULAR STENT GRAFT Bilateral 08/21/2021  ? Procedure: ENDOVASCULAR AORTIC STENT GRAFT REPAIR;  Surgeon: Broadus John, MD;  Location: [facility] OR;  Service: Vascular;  Laterality: Bilateral;  ? COLON RESECTION  2012  ? ULTRASOUND GUIDANCE FOR VASCULAR ACCESS Bilateral 08/21/2021  ? Procedure: ULTRASOUND GUIDANCE FOR VASCULAR ACCESS;  Surgeon: Broadus John, MD;  Location: [facility];  Service: Vascular;  Laterality: Bilateral;  ? ? ?There were no vitals filed for this visit. ? ? Subjective Assessment - 11/14/21 1438   ? ? Subjective Did good after last Rx. No new coplaints   ? Pertinent History hearing impairment, low back pain   ? Limitations Standing;Walking   ? Patient Stated Goals improved balance, navigate stairs easier   ? Currently in Pain? No/denies   ? ?  ?  ? ?  ? ? ? ? ? ? ? ? ? ? ? ? ? ? ? ? ? ? ? ? [place] Adult PT Treatment/Exercise - 11/14/21 0001   ? ?  ? Exercises  ? Exercises Knee/Hip   ?  ? Knee/Hip Exercises: Aerobic  ? Nustep L4 x 17 minutes   ?  ? Knee/Hip Exercises:  Machines for Strengthening  ? Cybex Knee Extension 30# 3xfatigue   ? Cybex Knee Flexion 50#  3 x fatigue   ?  ? Knee/Hip Exercises: Standing  ? Heel Raises Both;20 reps;2 sets;15 reps   ? Heel Raises Limitations B toe raise2 x15-20 reps   ? Hip Abduction Stengthening;Both;15 reps;Knee straight;Limitations;2 sets   ? ?  ?  ? ?  ? ? ? ? ? ? Balance Exercises - 11/14/21 0001   ? ?  ? Balance Exercises: Standing  ? Standing Eyes Opened Narrow base of support (BOS);Foam/compliant surface;Time;Cognitive challenge   ? Tandem Stance Eyes open;Foam/compliant surface;Intermittent upper extremity support;Time;Cognitive challenge   ? Rockerboard Anterior/posterior;Intermittent UE support   11mns  ? Balance Beam side stepping x 5 mins  UE assist PRN   ? Other Standing Exercises standing on balance pad toe touch x10 each LE to 8in step   ? ?  ?  ? ?  ? ? ? ? ? ? ? ? ? ? PT Long Term Goals - 10/19/21 1545   ? ?  ? PT LONG TERM GOAL #1  ? Title Patient will be independent with his HEP.   ?  Time 6   ? Period Weeks   ? Status New   ? Target Date 11/30/21   ?  ? PT LONG TERM GOAL #2  ? Title Patient will be able to safely navigate at least 4 steps with a reciprocal pattern for improved function getting into and out of his house.   ? Time 6   ? Period Weeks   ? Status New   ? Target Date 11/30/21   ?  ? PT LONG TERM GOAL #3  ? Title Patient will improve his five time sit to stand time to 12 seconds or less for improved lower extremity power and reduced fall risk.   ? Time 6   ? Period Weeks   ? Status New   ? Target Date 11/30/21   ? ?  ?  ? ?  ? ? ? ? ? ? ? ? Plan - 11/14/21 1511   ? ? Clinical Impression Statement Pt arrived today doing fairly well and reports going to the beach over the weekend and was able to walk on the beach with no falls. He reports noticing that his balance is doing better.Able to progress balance act's today.   ? Personal Factors and Comorbidities Time since onset of injury/illness/exacerbation;Comorbidity 3+    ? Comorbidities HTN, AAA, history of stroke, low back pain   ? Examination-Activity Limitations Locomotion Level;Transfers;Stairs   ? Stability/Clinical Decision Making Evolving/Moderate complexity   ? Rehab Potential Good   ? PT Frequency 2x / week   ? PT Duration 6 weeks   ? PT Treatment/Interventions ADLs/Self Care Home Management;Neuromuscular re-education;Balance training;Therapeutic exercise;Therapeutic activities;Functional mobility training;Stair training;Gait training;Patient/family education   ? PT Next Visit Plan nustep, lower extremity strength and balance interventions   ? Consulted and Agree with Plan of Care Patient   ? ?  ?  ? ?  ? ? ?Patient will benefit from skilled therapeutic intervention in order to improve the following deficits and impairments:  Abnormal gait, Difficulty walking, Decreased endurance, Decreased activity tolerance, Decreased balance, Decreased strength, Decreased mobility ? ?Visit Diagnosis: ?Other abnormalities of gait and mobility ? ? ? ? ?Problem List ?Patient Active Problem List  ? Diagnosis Date Noted  ? Abdominal aortic aneurysm (AAA) greater than 5.5 cm in diameter in male [facility]) 08/21/2021  ? Bilateral sensorineural hearing loss 04/03/2021  ? Loose stools 12/13/2019  ? Change in bowel habits 12/13/2019  ? Colon cancer screening 12/13/2019  ? Altered bowel function 10/30/2019  ? Onychomycosis of toenail 08/26/2018  ? Pre-diabetes 07/25/2017  ? History of stroke 03/23/2016  ? HLD (hyperlipidemia) 12/20/2015  ? BPH (benign prostatic hyperplasia) 11/11/2015  ? Healthcare maintenance 11/11/2015  ? Major depressive disorder, recurrent episode, moderate ([place])   ? Diverticulosis of colon without hemorrhage 05/02/2014  ? Essential hypertension 04/15/2014  ? Anxiety 04/15/2014  ? Hearing loss 04/15/2014  ? Dizziness 04/15/2014  ? ? ?Edon Hoadley,CHRIS, PTA ?11/14/2021, 3:28 PM ? ?[place] ?[facility] ?[address] ?[place], [place], [address] ?Phone:  [phone]   Fax:  [phone] ? ?Name: Lawrence Bennett ?MRN: [id number] ?Date of Birth: 10-06-1955 ? ? ? ?

## 2021-11-16 ENCOUNTER — Ambulatory Visit: Payer: Medicare HMO

## 2021-11-16 DIAGNOSIS — R2689 Other abnormalities of gait and mobility: Secondary | ICD-10-CM

## 2021-11-16 NOTE — Therapy (Signed)
[facility] [place], [place], [address] Phone: [phone]   Fax:  [phone]  Physical Therapy Treatment  Patient Details  Name: Lawrence Bennett MRN: [id number] Date of Birth: 10/04/1955 Referring Provider (PT): [place], [place]   Encounter Date: 11/16/2021   PT End of Session - 11/16/21 1446     Visit Number 8    Number of Visits 12    Date for PT Re-Evaluation 01/05/22    PT Start Time 1430    PT Stop Time 4818    PT Time Calculation (min) 48 min    Activity Tolerance Patient tolerated treatment well    Behavior During Therapy [facility] for tasks assessed/performed             Past Medical History:  Diagnosis Date   AAA (abdominal aortic aneurysm) ([place])    Anxiety    Atherosclerosis of aorta (HCC)    Atrial contractions, premature    Depression    Dyslipidemia    Dyspnea    Elevated glucose 11/11/2015   Hyperlipidemia    Mild hypertension    Stroke [facility])    Urinary hesitancy     Past Surgical History:  Procedure Laterality Date   ABDOMINAL AORTIC ENDOVASCULAR STENT GRAFT Bilateral 08/21/2021   Procedure: ENDOVASCULAR AORTIC STENT GRAFT REPAIR;  Surgeon: Broadus John, MD;  Location: [place];  Service: Vascular;  Laterality: Bilateral;   COLON RESECTION  2012   ULTRASOUND GUIDANCE FOR VASCULAR ACCESS Bilateral 08/21/2021   Procedure: ULTRASOUND GUIDANCE FOR VASCULAR ACCESS;  Surgeon: Broadus John, MD;  Location: [place];  Service: Vascular;  Laterality: Bilateral;    There were no vitals filed for this visit.   Subjective Assessment - 11/16/21 1445     Subjective Patient reports that he feels good today. He feels that his balance is improving.    Pertinent History hearing impairment, low back pain    Limitations Standing;Walking    Patient Stated Goals improved balance, navigate stairs easier    Currently in Pain? No/denies                               [facility] Adult PT  Treatment/Exercise - 11/16/21 0001       Knee/Hip Exercises: Aerobic   Nustep L5 x 15 minutes      Knee/Hip Exercises: Machines for Strengthening   Cybex Knee Extension 40# x 2.5 minutes    Cybex Knee Flexion 60# x 2.5 minutes      Knee/Hip Exercises: Standing   Step Down Both;20 reps;Hand Hold: 2;Step Height: 6"   with focus on eccentric quadriceps control     Knee/Hip Exercises: Supine   Bridges Both;20 reps;Other (comment)   with march   Straight Leg Raises Right;Left;2 sets;10 reps                 Balance Exercises - 11/16/21 0001       Balance Exercises: Standing   Rockerboard Anterior/posterior;Lateral;EO;Intermittent UE support   2 minutes each; increased difficulty with lateral   Tandem Gait Forward;Retro;Intermittent upper extremity support;Foam/compliant surface                     PT Long Term Goals - 10/19/21 1545       PT LONG TERM GOAL #1   Title Patient will be independent with his HEP.    Time 6    Period Weeks  Status New    Target Date 11/30/21      PT LONG TERM GOAL #2   Title Patient will be able to safely navigate at least 4 steps with a reciprocal pattern for improved function getting into and out of his house.    Time 6    Period Weeks    Status New    Target Date 11/30/21      PT LONG TERM GOAL #3   Title Patient will improve his five time sit to stand time to 12 seconds or less for improved lower extremity power and reduced fall risk.    Time 6    Period Weeks    Status New    Target Date 11/30/21                   Plan - 11/16/21 1446     Clinical Impression Statement Patient was progressed with multiple new and familiar interventions for improved lower extremity strength and stability. He required minimal cueing with bridges with a march for improved lumbopelvic stabilty to prevent lumbar rotation. He was educated on his progress with physical therapy to this point as evidenced by the increasing difficulty  of the interventions he is able to perform. He reported feeling a little tired and sore upon the conclusion of treatment. He continues to require skilled physical therapy to address his remaining impairments to maximize his safety and mobility.    Personal Factors and Comorbidities Time since onset of injury/illness/exacerbation;Comorbidity 3+    Comorbidities HTN, AAA, history of stroke, low back pain    Examination-Activity Limitations Locomotion Level;Transfers;Stairs    Stability/Clinical Decision Making Evolving/Moderate complexity    Rehab Potential Good    PT Frequency 2x / week    PT Duration 6 weeks    PT Treatment/Interventions ADLs/Self Care Home Management;Neuromuscular re-education;Balance training;Therapeutic exercise;Therapeutic activities;Functional mobility training;Stair training;Gait training;Patient/family education    PT Next Visit Plan nustep, lower extremity strength and balance interventions    Consulted and Agree with Plan of Care Patient             Patient will benefit from skilled therapeutic intervention in order to improve the following deficits and impairments:  Abnormal gait, Difficulty walking, Decreased endurance, Decreased activity tolerance, Decreased balance, Decreased strength, Decreased mobility  Visit Diagnosis: Other abnormalities of gait and mobility     Problem List Patient Active Problem List   Diagnosis Date Noted   Abdominal aortic aneurysm (AAA) greater than 5.5 cm in diameter in male ([place]) 08/21/2021   Bilateral sensorineural hearing loss 04/03/2021   Loose stools 12/13/2019   Change in bowel habits 12/13/2019   Colon cancer screening 12/13/2019   Altered bowel function 10/30/2019   Onychomycosis of toenail 08/26/2018   Pre-diabetes 07/25/2017   History of stroke 03/23/2016   HLD (hyperlipidemia) 12/20/2015   BPH (benign prostatic hyperplasia) 11/11/2015   Healthcare maintenance 11/11/2015   Major depressive disorder, recurrent  episode, moderate ([place])    Diverticulosis of colon without hemorrhage 05/02/2014   Essential hypertension 04/15/2014   Anxiety 04/15/2014   Hearing loss 04/15/2014   Dizziness 04/15/2014    Darlin Coco, PT 11/16/2021, 3:41 PM  [facility] [address] [place], [place], [address] Phone: [phone]   Fax:  [phone]  Name: Lawrence Bennett MRN: [id number] Date of Birth: 11-25-1955

## 2021-11-18 ENCOUNTER — Other Ambulatory Visit (HOSPITAL_COMMUNITY): Payer: Self-pay

## 2021-11-21 ENCOUNTER — Ambulatory Visit: Payer: Medicare HMO

## 2021-11-21 DIAGNOSIS — R2689 Other abnormalities of gait and mobility: Secondary | ICD-10-CM

## 2021-11-21 NOTE — Therapy (Signed)
[facility] [place], [place], [address] Phone: [phone]   Fax:  [phone]  Physical Therapy Treatment  Patient Details  Name: Lawrence Bennett MRN: [id number] Date of Birth: 16-Nov-1955 Referring Provider (PT): [place], [place]   Encounter Date: 11/21/2021   PT End of Session - 11/21/21 1348     Visit Number 9    Number of Visits 12    Date for PT Re-Evaluation 01/05/22    PT Start Time 5462    PT Stop Time 7035    PT Time Calculation (min) 52 min    Activity Tolerance Patient tolerated treatment well    Behavior During Therapy [facility] for tasks assessed/performed             Past Medical History:  Diagnosis Date   AAA (abdominal aortic aneurysm) ([place])    Anxiety    Atherosclerosis of aorta (HCC)    Atrial contractions, premature    Depression    Dyslipidemia    Dyspnea    Elevated glucose 11/11/2015   Hyperlipidemia    Mild hypertension    Stroke [facility])    Urinary hesitancy     Past Surgical History:  Procedure Laterality Date   ABDOMINAL AORTIC ENDOVASCULAR STENT GRAFT Bilateral 08/21/2021   Procedure: ENDOVASCULAR AORTIC STENT GRAFT REPAIR;  Surgeon: Broadus John, MD;  Location: [place];  Service: Vascular;  Laterality: Bilateral;   COLON RESECTION  2012   ULTRASOUND GUIDANCE FOR VASCULAR ACCESS Bilateral 08/21/2021   Procedure: ULTRASOUND GUIDANCE FOR VASCULAR ACCESS;  Surgeon: Broadus John, MD;  Location: [place];  Service: Vascular;  Laterality: Bilateral;    There were no vitals filed for this visit.   Subjective Assessment - 11/21/21 1348     Subjective Patient reports that he feels alright today and has not had any problems since his last appointment.    Pertinent History hearing impairment, low back pain    Limitations Standing;Walking    Patient Stated Goals improved balance, navigate stairs easier    Currently in Pain? No/denies                [facility] PT Assessment - 11/21/21 0001        Standardized Balance Assessment   Five times sit to stand comments  23 seconds   assessed at the conclusion of treatment                          [facility] Adult PT Treatment/Exercise - 11/21/21 0001       Knee/Hip Exercises: Aerobic   Recumbent Bike L3 x 15 minutes      Knee/Hip Exercises: Machines for Strengthening   Cybex Knee Extension 40# x 2.5 minutes    Cybex Knee Flexion 60#; 2 x 2.5 minutes    Cybex Leg Press 3 plates; seat 7; 30 reps                 Balance Exercises - 11/21/21 0001       Balance Exercises: Standing   Tandem Stance Eyes open;Foam/compliant surface;Intermittent upper extremity support;4 reps;30 secs   semi-tandem   Rockerboard Anterior/posterior;Intermittent UE support   2 minutes                    PT Long Term Goals - 11/21/21 1427       PT LONG TERM GOAL #1   Title Patient will be independent with his HEP.  Time 6    Period Weeks    Status On-going    Target Date 11/30/21      PT LONG TERM GOAL #2   Title Patient will be able to safely navigate at least 4 steps with a reciprocal pattern for improved function getting into and out of his house.    Baseline required railing with decending stairs    Time 6    Period Weeks    Status Partially Met    Target Date 11/30/21      PT LONG TERM GOAL #3   Title Patient will improve his five time sit to stand time to 12 seconds or less for improved lower extremity power and reduced fall risk.    Time 6    Period Weeks    Status New    Target Date 11/30/21                   Plan - 11/21/21 1422     Clinical Impression Statement Patient is making good progress with skilled physical therapy as evidenced by his subjective reports and progress toward his goals. His progress toward his five time sit to stand goal was partially limited due to lower extremity fatigue as this was assessed at the conclusion of treatment. He was progressed with lower  extremity strengthening interventions with moderate difficutly. He required minimal cueing with the leg press for improved eccentric quadriceps control. He exhibited a slight increase in difficulty with today's balance interventions due to increased lower extremity fatigue from prior interventions. He reported feeling tired upon the conclusion of treatment. Recommend that he continue with skilled physical therapy to address his remaining impairments to return to his prior level of function.    Personal Factors and Comorbidities Time since onset of injury/illness/exacerbation;Comorbidity 3+    Comorbidities HTN, AAA, history of stroke, low back pain    Examination-Activity Limitations Locomotion Level;Transfers;Stairs    Stability/Clinical Decision Making Evolving/Moderate complexity    Rehab Potential Good    PT Frequency 2x / week    PT Duration 6 weeks    PT Treatment/Interventions ADLs/Self Care Home Management;Neuromuscular re-education;Balance training;Therapeutic exercise;Therapeutic activities;Functional mobility training;Stair training;Gait training;Patient/family education    PT Next Visit Plan nustep, lower extremity strength and balance interventions    Consulted and Agree with Plan of Care Patient             Patient will benefit from skilled therapeutic intervention in order to improve the following deficits and impairments:  Abnormal gait, Difficulty walking, Decreased endurance, Decreased activity tolerance, Decreased balance, Decreased strength, Decreased mobility  Visit Diagnosis: Other abnormalities of gait and mobility     Problem List Patient Active Problem List   Diagnosis Date Noted   Abdominal aortic aneurysm (AAA) greater than 5.5 cm in diameter in male ([place]) 08/21/2021   Bilateral sensorineural hearing loss 04/03/2021   Loose stools 12/13/2019   Change in bowel habits 12/13/2019   Colon cancer screening 12/13/2019   Altered bowel function 10/30/2019    Onychomycosis of toenail 08/26/2018   Pre-diabetes 07/25/2017   History of stroke 03/23/2016   HLD (hyperlipidemia) 12/20/2015   BPH (benign prostatic hyperplasia) 11/11/2015   Healthcare maintenance 11/11/2015   Major depressive disorder, recurrent episode, moderate ([place])    Diverticulosis of colon without hemorrhage 05/02/2014   Essential hypertension 04/15/2014   Anxiety 04/15/2014   Hearing loss 04/15/2014   Dizziness 04/15/2014    Darlin Coco, PT 11/21/2021, 3:39 PM  [facility] [place], [place], [address] Phone: [phone]   Fax:  [phone]  Name: Lawrence Bennett MRN: [id number] Date of Birth: April 29, 1956

## 2021-11-23 ENCOUNTER — Encounter: Payer: Medicare HMO | Admitting: *Deleted

## 2021-11-28 ENCOUNTER — Ambulatory Visit: Payer: Medicare HMO | Admitting: Physical Therapy

## 2021-11-28 ENCOUNTER — Encounter: Payer: Self-pay | Admitting: Physical Therapy

## 2021-11-28 DIAGNOSIS — R2689 Other abnormalities of gait and mobility: Secondary | ICD-10-CM | POA: Diagnosis not present

## 2021-11-28 NOTE — Therapy (Signed)
[facility] [place], [place], [address] Phone: [phone]   Fax:  [phone]  Physical Therapy Treatment  Patient Details  Name: Lawrence Bennett MRN: [id number] Date of Birth: April 21, 1956 Referring Provider (PT): [place], [place]   Encounter Date: 11/28/2021   PT End of Session - 11/28/21 1355     Visit Number 10    Number of Visits 12    Date for PT Re-Evaluation 01/05/22    PT Start Time 5809    PT Stop Time 1430    PT Time Calculation (min) 41 min    Activity Tolerance Patient tolerated treatment well    Behavior During Therapy [facility] for tasks assessed/performed             Past Medical History:  Diagnosis Date   AAA (abdominal aortic aneurysm) ([place])    Anxiety    Atherosclerosis of aorta (HCC)    Atrial contractions, premature    Depression    Dyslipidemia    Dyspnea    Elevated glucose 11/11/2015   Hyperlipidemia    Mild hypertension    Stroke [facility])    Urinary hesitancy     Past Surgical History:  Procedure Laterality Date   ABDOMINAL AORTIC ENDOVASCULAR STENT GRAFT Bilateral 08/21/2021   Procedure: ENDOVASCULAR AORTIC STENT GRAFT REPAIR;  Surgeon: Broadus John, MD;  Location: [place];  Service: Vascular;  Laterality: Bilateral;   COLON RESECTION  2012   ULTRASOUND GUIDANCE FOR VASCULAR ACCESS Bilateral 08/21/2021   Procedure: ULTRASOUND GUIDANCE FOR VASCULAR ACCESS;  Surgeon: Broadus John, MD;  Location: [place];  Service: Vascular;  Laterality: Bilateral;    There were no vitals filed for this visit.   Subjective Assessment - 11/28/21 1355     Subjective No new complaints or falls since last visit. Feels that he can walk in the yard better.    Pertinent History hearing impairment, low back pain    Limitations Standing;Walking    How long can you walk comfortably? to his mailbox    Patient Stated Goals improved balance, navigate stairs easier    Currently in Pain? No/denies                 [facility] PT Assessment - 11/28/21 0001       Assessment   Medical Diagnosis [place] of lacunar infarction    Referring Provider (PT) Lajuana Ripple, DO    Next MD Visit 04/17/22    Prior Therapy No      Precautions   Precautions Fall      Restrictions   Weight Bearing Restrictions No                           [facility] Adult PT Treatment/Exercise - 11/28/21 0001       Ambulation/Gait   Stairs Yes    Stairs Assistance 6: Modified independent (Device/Increase time)    Stair Management Technique One rail Right;Alternating pattern;Forwards    Number of Stairs 4    Height of Stairs 6.5    Door Management 7: Independent      Knee/Hip Exercises: Aerobic   Recumbent Bike L3 x17 min      Knee/Hip Exercises: Machines for Strengthening   Cybex Knee Extension 40# 3x10 reps    Cybex Knee Flexion 60# 3x10 reps    Cybex Leg Press 2 pl, seat 8 x30 reps      Knee/Hip Exercises: Standing   Heel Raises Both;20 reps  Heel Raises Limitations B toe raise x20 reps    Hip Abduction Stengthening;Both;2 sets;10 reps;Knee straight                 Balance Exercises - 11/28/21 0001       Balance Exercises: Standing   Tandem Stance Eyes open;Foam/compliant surface;Intermittent upper extremity support;Time    Tandem Stance Limitations x2 min    Sidestepping Foam/compliant support;Upper extremity support;4 reps    Other Standing Exercises toe taps to 8" step x2 min                     PT Long Term Goals - 11/28/21 1419       PT LONG TERM GOAL #1   Title Patient will be independent with his HEP.    Time 6    Period Weeks    Status On-going    Target Date 11/30/21      PT LONG TERM GOAL #2   Title Patient will be able to safely navigate at least 4 steps with a reciprocal pattern for improved function getting into and out of his house.    Baseline required railing with decending stairs    Time 6    Period Weeks    Status Achieved    Target Date  11/30/21      PT LONG TERM GOAL #3   Title Patient will improve his five time sit to stand time to 12 seconds or less for improved lower extremity power and reduced fall risk.    Time 6    Period Weeks    Status Achieved    Target Date 11/30/21                   Plan - 11/28/21 1431     Clinical Impression Statement Patient presented in clinic with reports of no new falls and seeing some improvement. Patient did report that he had a lot of steps [place] go [place] at the beach and long walks such as to [place] of the pier. Patient able to tolerate strengthening session well but was fatigued throughout session. Patient able to achieve two LTGs today while in clinic. Patient able to tolerate balance activities fairly well although showing increased ankle strategy.    Personal Factors and Comorbidities Time since onset of injury/illness/exacerbation;Comorbidity 3+    Comorbidities HTN, AAA, history of stroke, low back pain    Examination-Activity Limitations Locomotion Level;Transfers;Stairs    Examination-Participation Restrictions Community Activity;Other    Stability/Clinical Decision Making Evolving/Moderate complexity    Rehab Potential Good    PT Frequency 2x / week    PT Duration 6 weeks    PT Treatment/Interventions ADLs/Self Care Home Management;Neuromuscular re-education;Balance training;Therapeutic exercise;Therapeutic activities;Functional mobility training;Stair training;Gait training;Patient/family education    PT Next Visit Plan nustep, lower extremity strength and balance interventions    Consulted and Agree with Plan of Care Patient             Patient will benefit from skilled therapeutic intervention in order to improve the following deficits and impairments:  Abnormal gait, Difficulty walking, Decreased endurance, Decreased activity tolerance, Decreased balance, Decreased strength, Decreased mobility  Visit Diagnosis: Other abnormalities of gait and  mobility     Problem List Patient Active Problem List   Diagnosis Date Noted   Abdominal aortic aneurysm (AAA) greater than 5.5 cm in diameter in male ([place]) 08/21/2021   Bilateral sensorineural hearing loss 04/03/2021   Loose stools 12/13/2019   Change in  bowel habits 12/13/2019   Colon cancer screening 12/13/2019   Altered bowel function 10/30/2019   Onychomycosis of toenail 08/26/2018   Pre-diabetes 07/25/2017   History of stroke 03/23/2016   HLD (hyperlipidemia) 12/20/2015   BPH (benign prostatic hyperplasia) 11/11/2015   Healthcare maintenance 11/11/2015   Major depressive disorder, recurrent episode, moderate (HCC)    Diverticulosis of colon without hemorrhage 05/02/2014   Essential hypertension 04/15/2014   Anxiety 04/15/2014   Hearing loss 04/15/2014   Dizziness 04/15/2014    Standley Brooking, PTA 11/28/2021, 2:43 PM  [facility] [address] [place], [place], [address] Phone: [phone]   Fax:  [phone]  Name: Lawrence Bennett MRN: [id number] Date of Birth: Sep 28, 1955

## 2021-12-01 ENCOUNTER — Other Ambulatory Visit (HOSPITAL_COMMUNITY): Payer: Self-pay | Admitting: Psychiatry

## 2021-12-04 ENCOUNTER — Other Ambulatory Visit (HOSPITAL_COMMUNITY): Payer: Self-pay

## 2021-12-04 MED ORDER — QUETIAPINE FUMARATE 50 MG PO TABS
ORAL_TABLET | Freq: Every day | ORAL | 2 refills | Status: DC
  Filled 2021-12-04: qty 90, 90d supply, fill #0
  Filled 2022-02-28: qty 90, 90d supply, fill #1

## 2021-12-04 NOTE — Telephone Encounter (Signed)
Call for appt

## 2021-12-05 ENCOUNTER — Other Ambulatory Visit (HOSPITAL_COMMUNITY): Payer: Self-pay

## 2021-12-07 ENCOUNTER — Other Ambulatory Visit (HOSPITAL_COMMUNITY): Payer: Self-pay

## 2021-12-07 ENCOUNTER — Ambulatory Visit: Payer: Medicare HMO | Attending: Family Medicine

## 2021-12-07 DIAGNOSIS — R2689 Other abnormalities of gait and mobility: Secondary | ICD-10-CM | POA: Diagnosis not present

## 2021-12-07 NOTE — Therapy (Addendum)
[place]rence [place], [place], [address] Phone: [phone]   Fax:  [phone]  Physical Therapy Treatment  Patient Details  Name: Lawrence Bennett MRN: [id number] Date of Birth: 1956-02-15 Referring Provider (PT): Harrison, Lawrence Bennett   Encounter Date: 12/07/2021   PT End of Session - 12/07/21 1434     Visit Number 11    Number of Visits 12    Date for PT Re-Evaluation 01/05/22    PT Start Time 1430    PT Stop Time 9798    PT Time Calculation (min) 45 min    Activity Tolerance Patient tolerated treatment well    Behavior During Therapy [facility] for tasks assessed/performed             Past Medical History:  Diagnosis Date   AAA (abdominal aortic aneurysm) ([place])    Anxiety    Atherosclerosis of aorta (HCC)    Atrial contractions, premature    Depression    Dyslipidemia    Dyspnea    Elevated glucose 11/11/2015   Hyperlipidemia    Mild hypertension    Stroke [facility])    Urinary hesitancy     Past Surgical History:  Procedure Laterality Date   ABDOMINAL AORTIC ENDOVASCULAR STENT GRAFT Bilateral 08/21/2021   Procedure: ENDOVASCULAR AORTIC STENT GRAFT REPAIR;  Surgeon: Broadus John, MD;  Location: [place];  Service: Vascular;  Laterality: Bilateral;   COLON RESECTION  2012   ULTRASOUND GUIDANCE FOR VASCULAR ACCESS Bilateral 08/21/2021   Procedure: ULTRASOUND GUIDANCE FOR VASCULAR ACCESS;  Surgeon: Broadus John, MD;  Location: [facility], [place];  Service: Vascular;  Laterality: Bilateral;    There were no vitals filed for this visit.   Subjective Assessment - 12/07/21 1433     Subjective Pt arrives for today's treatment denying any pain.  Pt states that he is ready to be put on hold today and start visiting the local gym.    Pertinent History hearing impairment, low back pain    Limitations Standing;Walking    How long can you walk comfortably? to his mailbox    Patient Stated Goals improved balance, navigate stairs easier     Currently in Pain? No/denies                               [facility] Adult PT Treatment/Exercise - 12/07/21 0001       Knee/Hip Exercises: Aerobic   Recumbent Bike Lvl 4 x 20 mins      Knee/Hip Exercises: Machines for Strengthening   Cybex Knee Extension 50# x 20 reps    Cybex Knee Flexion 70# x 20 reps    Cybex Leg Press 3 plates; seat 8 x 25 reps      Knee/Hip Exercises: Standing   Heel Raises Both;20 reps    Heel Raises Limitations B toe raise x20 reps    Hip Flexion Both;20 reps;Knee straight    Hip Flexion Limitations green tband    Hip Abduction Both;20 reps;Knee straight    Abduction Limitations green tband    Hip Extension Both;20 reps;Knee straight    Extension Limitations green tband                 Balance Exercises - 12/07/21 0001       Balance Exercises: Standing   Tandem Stance Eyes open;3 reps   1 min each way   SLS Eyes open;2 reps   10" each rep  PT Long Term Goals - 12/07/21 1436       PT LONG TERM GOAL #1   Title Patient will be independent with his HEP.    Time 6    Period Weeks    Status Achieved    Target Date 11/30/21      PT LONG TERM GOAL #2   Title Patient will be able to safely navigate at least 4 steps with a reciprocal pattern for improved function getting into and out of his house.    Baseline required railing with decending stairs    Time 6    Period Weeks    Status Achieved    Target Date 11/30/21      PT LONG TERM GOAL #3   Title Patient will improve his five time sit to stand time to 12 seconds Bennett less for improved lower extremity power and reduced fall risk.    Time 6    Period Weeks    Status Achieved    Target Date 11/30/21                   Plan - 12/07/21 1435     Clinical Impression Statement Pt arrives for today's treatment session denying any pain.  Pt able to tolerate increased weight with all cybex exercises during today's treatment session  without issue Bennett complaint.  Pt instructed in standing resisted hip exercises with min cues required for proper technique.  Pt has met all of his goals at this time and is ready to attempt the gym on his own.  Pt given printed HEP and green tband for home use.  Pt encouraged to call the facility with any issues Bennett concerns.    Personal Factors and Comorbidities Time since onset of injury/illness/exacerbation;Comorbidity 3+    Comorbidities HTN, AAA, history of stroke, low back pain    Examination-Activity Limitations Locomotion Level;Transfers;Stairs    Examination-Participation Restrictions Community Activity;Other    Stability/Clinical Decision Making Evolving/Moderate complexity    Rehab Potential Good    PT Frequency 2x / week    PT Duration 6 weeks    PT Treatment/Interventions ADLs/Self Care Home Management;Neuromuscular re-education;Balance training;Therapeutic exercise;Therapeutic activities;Functional mobility training;Stair training;Gait training;Patient/family education    PT Next Visit Plan nustep, lower extremity strength and balance interventions    Consulted and Agree with Plan of Care Patient             Patient will benefit from skilled therapeutic intervention in order to improve the following deficits and impairments:  Abnormal gait, Difficulty walking, Decreased endurance, Decreased activity tolerance, Decreased balance, Decreased strength, Decreased mobility  Visit Diagnosis: Other abnormalities of gait and mobility     Problem List Patient Active Problem List   Diagnosis Date Noted   Abdominal aortic aneurysm (AAA) greater than 5.5 cm in diameter in male ([place] Bennett) 08/21/2021   Bilateral sensorineural hearing loss 04/03/2021   Loose stools 12/13/2019   Change in bowel habits 12/13/2019   Colon cancer screening 12/13/2019   Altered bowel function 10/30/2019   Onychomycosis of toenail 08/26/2018   Pre-diabetes 07/25/2017   History of stroke 03/23/2016   HLD  (hyperlipidemia) 12/20/2015   BPH (benign prostatic hyperplasia) 11/11/2015   Healthcare maintenance 11/11/2015   Major depressive disorder, recurrent episode, moderate ([place])    Diverticulosis of colon without hemorrhage 05/02/2014   Essential hypertension 04/15/2014   Anxiety 04/15/2014   Hearing loss 04/15/2014   Dizziness 04/15/2014   Rationale for Evaluation and Treatment Rehabilitation  Kathrynn Ducking,  PTA 12/07/2021, 3:30 PM  [facility] [place], [place], [address] Phone: [phone]   Fax:  [phone]  Name: THIEN BERKA MRN: [id number] Date of Birth: 21-May-1956  PHYSICAL THERAPY DISCHARGE SUMMARY  Visits from Start of Care: 11  Current functional level related to goals / functional outcomes: Patient was able to meet all of his goals for skilled physical therapy.    Remaining deficits: None    Education / Equipment: HEP   Patient agrees to discharge. Patient goals were met. Patient is being discharged due to meeting the stated rehab goals.  Jacqulynn Cadet, PT, DPT

## 2021-12-21 ENCOUNTER — Other Ambulatory Visit (HOSPITAL_COMMUNITY): Payer: Self-pay | Admitting: Psychiatry

## 2021-12-22 ENCOUNTER — Other Ambulatory Visit (HOSPITAL_COMMUNITY): Payer: Self-pay

## 2021-12-25 ENCOUNTER — Other Ambulatory Visit (HOSPITAL_COMMUNITY): Payer: Self-pay

## 2021-12-25 MED ORDER — DIVALPROEX SODIUM ER 500 MG PO TB24
ORAL_TABLET | Freq: Every day | ORAL | 2 refills | Status: DC
  Filled 2021-12-25: qty 90, 90d supply, fill #0
  Filled 2022-03-12: qty 90, 90d supply, fill #1

## 2021-12-29 ENCOUNTER — Ambulatory Visit: Payer: Medicare HMO | Admitting: Cardiology

## 2021-12-29 ENCOUNTER — Encounter: Payer: Self-pay | Admitting: Cardiology

## 2021-12-29 VITALS — BP 131/72 | HR 96 | Temp 98.3°F | Resp 16 | Ht 70.0 in | Wt 258.8 lb

## 2021-12-29 DIAGNOSIS — Z8679 Personal history of other diseases of the circulatory system: Secondary | ICD-10-CM | POA: Diagnosis not present

## 2021-12-29 DIAGNOSIS — Z8673 Personal history of transient ischemic attack (TIA), and cerebral infarction without residual deficits: Secondary | ICD-10-CM | POA: Diagnosis not present

## 2021-12-29 DIAGNOSIS — E782 Mixed hyperlipidemia: Secondary | ICD-10-CM

## 2021-12-29 DIAGNOSIS — R0609 Other forms of dyspnea: Secondary | ICD-10-CM | POA: Diagnosis not present

## 2021-12-29 DIAGNOSIS — I700 Atherosclerosis of aorta: Secondary | ICD-10-CM

## 2021-12-29 DIAGNOSIS — I714 Abdominal aortic aneurysm, without rupture, unspecified: Secondary | ICD-10-CM | POA: Diagnosis not present

## 2021-12-29 DIAGNOSIS — Z9889 Other specified postprocedural states: Secondary | ICD-10-CM | POA: Diagnosis not present

## 2021-12-29 DIAGNOSIS — I10 Essential (primary) hypertension: Secondary | ICD-10-CM

## 2021-12-29 NOTE — Progress Notes (Signed)
ID:  Lawrence Bennett, DOB October 05, 1955, MRN [id number]  PCP:  [name], DO  Cardiologist:  Rex Kras, DO, [facility] (established care 04/13/2021) Former Cardiology Providers: Dr. Lyman Bishop   Date: 12/29/21 Last Office Visit: 09/29/2021  Chief Complaint  Patient presents with   Follow-up    56-month   HPI  Lawrence NOLEis a 66y.o. male whose past medical history and cardiovascular risk factors include: History of PVCs, abdominal aortic aneurysm 5.7 cm status post EVAR (08/21/2021), history of stroke, former smoker, hypertension, hyperlipidemia, aortic atherosclerosis, obesity due to excess calories  Patient is accompanied by his wife [name] today's office visit.  He had establish care back in October 2023 after noting bilateral CVAs on imaging study and the concern for possible cardioembolic etiology.  During her prior office visits we have discussed undergoing Holter monitor versus loop recorder to monitor for A-fib given his history.  At the last office visit patient states that he wanted to consider it was not willing to proceed forward.  He now presents for 363-monthollow-up visit.  Patient states that he wants to proceed with a loop recorder and does not have any additional questions with regards to risks, benefits, and alternatives would like to hold off until the summer is over.  He denies anginal discomfort or heart failure symptoms.  He still has dyspnea on exertion overall improving but still present.  His office blood pressures are within acceptable range.  Prior echocardiogram notes preserved LVEF without any significant valvular heart disease and nuclear stress test was overall intermediate given the hypertensive response to exercise and attenuation artifact present.  At the last office visit he was recommended to undergo pulmonary evaluation for noncardiac causes of dyspnea but this is still pending.  At the last office visit we also discussed being evaluated for  sleep apnea but patient has not proceeded forward.  FUNCTIONAL STATUS: No structured exercise program or daily routine but patient keeps himself active around the house.  ALLERGIES: No Known Allergies  MEDICATION LIST PRIOR TO VISIT: Current Meds  Medication Sig   amLODipine (NORVASC) 10 MG tablet Take 1 tablet (10 mg total) by mouth daily.   aspirin EC 81 MG tablet Take 81 mg by mouth daily. Swallow whole.   atorvastatin (LIPITOR) 80 MG tablet TAKE 1 TABLET (80 MG TOTAL) BY MOUTH DAILY.   clopidogrel (PLAVIX) 75 MG tablet Take 1 tablet by mouth once daily.   divalproex (DEPAKOTE ER) 500 MG 24 hr tablet TAKE 1 TABLET BY MOUTH AT BEDTIME.   LORazepam (ATIVAN) 0.5 MG tablet TAKE 1 TABLET BY MOUTH 3 TIMES DAILY.   losartan (COZAAR) 50 MG tablet Take 1 tablet (50 mg total) by mouth daily.   PARoxetine (PAXIL) 20 MG tablet TAKE 1 TABLET BY MOUTH AT BEDTIME.   QUEtiapine (SEROQUEL) 50 MG tablet TAKE 1 TABLET BY MOUTH AT BEDTIME.   tamsulosin (FLOMAX) 0.4 MG CAPS capsule TAKE 1 CAPSULE BY MOUTH IN THE MORNING AND AT BEDTIME (Patient taking differently: 0.4 mg daily.)   traZODone (DESYREL) 50 MG tablet TAKE 1 TABLET BY MOUTH AT BEDTIME.   venlafaxine XR (EFFEXOR-XR) 75 MG 24 hr capsule Take 1 capsule (75 mg total) by mouth daily with breakfast.   vitamin C (ASCORBIC ACID) 500 MG tablet Take 500 mg by mouth daily.   zinc gluconate 50 MG tablet Take 50 mg by mouth daily.     PAST MEDICAL HISTORY: Past Medical History:  Diagnosis Date  AAA (abdominal aortic aneurysm) (HCC)    Anxiety    Atherosclerosis of aorta (HCC)    Atrial contractions, premature    Depression    Dyslipidemia    Dyspnea    Elevated glucose 11/11/2015   Hyperlipidemia    Mild hypertension    Stroke [facility])    Urinary hesitancy     PAST SURGICAL HISTORY: Past Surgical History:  Procedure Laterality Date   ABDOMINAL AORTIC ENDOVASCULAR STENT GRAFT Bilateral 08/21/2021   Procedure: ENDOVASCULAR AORTIC STENT GRAFT  REPAIR;  Surgeon: Broadus John, MD;  Location: [place];  Service: Vascular;  Laterality: Bilateral;   COLON RESECTION  2012   ULTRASOUND GUIDANCE FOR VASCULAR ACCESS Bilateral 08/21/2021   Procedure: ULTRASOUND GUIDANCE FOR VASCULAR ACCESS;  Surgeon: Broadus John, MD;  Location[facility] OR;  Service: Vascular;  Laterality: Bilateral;    FAMILY HISTORY: The patient family history includes Bipolar disorder in his father; COPD in his mother; Depression in his sister; Hypertension in his sister; Stroke in his father.  SOCIAL HISTORY:  The patient  reports that he quit smoking about 8 years ago. His smoking use included cigarettes. He has a 3.80 pack-year smoking history. He has never used smokeless tobacco. He reports that he does not drink alcohol and does not use drugs.  REVIEW OF SYSTEMS: Review of Systems  Constitutional: Negative for chills and fever.  HENT:  Positive for hearing loss (hard of hearing). Negative for hoarse voice and nosebleeds.   Eyes:  Negative for discharge, double vision and pain.  Cardiovascular:  Negative for chest pain, claudication, dyspnea on exertion, leg swelling, near-syncope, orthopnea, palpitations, paroxysmal nocturnal dyspnea and syncope.  Respiratory:  Positive for snoring. Negative for hemoptysis and shortness of breath.   Musculoskeletal:  Negative for muscle cramps and myalgias.  Gastrointestinal:  Negative for abdominal pain, constipation, diarrhea, hematemesis, hematochezia, melena, nausea and vomiting.  Neurological:  Negative for dizziness and light-headedness.       At times difficulty with balance.     PHYSICAL EXAM:    12/29/2021    2:22 PM 10/12/2021   12:49 PM 10/03/2021    3:21 PM  Vitals with BMI  Height '5\' 10"'$  '5\' 10"'$  '5\' 10"'$   Weight 258 lbs 13 oz 253 lbs 6 oz 252 lbs 6 oz  BMI 37.13 69.62 95.28  Systolic 413 244 010  Diastolic 72 77 82  Pulse 96 94 97    CONSTITUTIONAL: Well-developed and well-nourished. No acute distress.  SKIN:  Skin is warm and dry. No rash noted. No cyanosis. No pallor. No jaundice HEAD: Normocephalic and atraumatic.  EYES: No scleral icterus MOUTH/THROAT: Moist oral membranes.  NECK: No JVD present. No thyromegaly noted. No carotid bruits  CHEST Normal respiratory effort. No intercostal retractions  LUNGS: Clear to auscultation bilaterally.  No stridor. No wheezes. No rales.  CARDIOVASCULAR: Regular rate and rhythm, positive S1-S2, no murmurs rubs or gallops appreciated ABDOMINAL: No apparent ascites.  EXTREMITIES: No peripheral edema  HEMATOLOGIC: No significant bruising NEUROLOGIC: Oriented to person, place, and time. Nonfocal. Normal muscle tone.  PSYCHIATRIC: Normal mood and affect. Normal behavior. Cooperative  MRI Brain W/O Contrast: 03/17/2021 3 mm acute/early subacute lacunar infarct within the left basal ganglia.   Chronic small-vessel infarcts within the left corona radiata, left basal ganglia and right thalamus. The small-vessel infarcts within the right thalamus are from the brain MRI of 05/13/2014.   Background mild chronic small vessel ischemic disease within the cerebral white matter, progressed.   Moderate generalized  cerebral atrophy, with comparatively mild cerebellar atrophy, also progressed.   Paranasal sinus disease, as described.   Trace fluid within the bilateral mastoid air cells.  CARDIAC DATABASE: EKG: 09/29/2021: NSR, 90 bpm, normal axis, nonspecific ST-T changes.   Echocardiogram: 04/21/2021:  Normal LV systolic function with visual EF 55-60%. Left ventricle cavity is normal in size. Normal wall thickness with basal septal hypertrophy.  Normal global wall motion. Normal diastolic filling pattern, normal LAP.  Trace tricuspid regurgitation. No evidence of pulmonary hypertension.  No prior study for comparison.   Stress Testing: Exercise nuclear stress test 04/26/2021:   Abnormal ECG stress. The patient exercised for 5 minutes and 41 seconds of a Bruce  protocol, achieving approximately 4.64 METs.  Reduced exercise tolerance. Hypertensive BP response. Resting EKG demonstrated normal sinus rhythm. No ST-T wave abnormalities. Peak EKG revealed 2 mm down sloping ST depression in the inferior leads persisted for 3 min into recovery.  Mild diaphragmatic attenuation noted in the inferior wall. Mild small sized ischemia in this region cannot be excluded.  Gated SPECT imaging of the left ventricle was normal. All segments of left ventricle demonstrated normal wall motion and thickening. No stress lung uptake. TID is normal. Stress LV EF is normal 63%.  Intermediate risk study due to abnormal EKG response, however hypertension may induce such change. No prior exam available for comparison.  Heart Catheterization: None  Carotid duplex: 03/11/2014: Trace heterogenous atherosclerotic plaque in bilateral carotid bifurcation without evidence of ICA stenosis.  Vertebral arteries are patent and antegrade flow.  Ultrasound aorta: 03/11/2014: 3.5 x 3.7 cm distal abdominal aortic aneurysm shows mild progression since 2012.  Follow-up study in 2 years.  Abdominal Aortic Duplex 04/21/2021:  Severe dilatation of the abdominal aorta is noted in the distal aorta.  Moderate dilatation of the abdominal aorta is noted in the mid aorta. An abdominal aortic aneurysm measuring 4.65 x 4.7 x 5.42 cm is seen.  Mild plaque noted in the mid and distal aorta.  Iliacs were not visualized due to body habitus.  Compared to CTA abdomen 05/13/2014:  infrarenal abdominal aortic aneurysm measures 3.5 by 3.7 cm in diameter.  This study suggests significant progression of the AAA. COnsider CTA to further evaluate or referral to vascular surgery.  VAS US Renal artery bilateral  08/22/2021 Right: No evidence of right renal artery stenosis. Normal right Resistive Index. Normal size right kidney. RRV flow present.  Left:  No evidence of left renal artery stenosis. Normal left Resistive  Index. Normal size of left kidney. LRV flow present.   LABORATORY DATA:    Latest Ref Rng & Units 08/22/2021    4:40 AM 08/21/2021   10:15 AM 08/18/2021    3:18 PM  CBC  WBC 4.0 - 10.5 K/uL 12.0  7.2  7.1   Hemoglobin 13.0 - 17.0 g/dL 13.1  13.4  14.4   Hematocrit 39.0 - 52.0 % 39.2  40.7  44.2   Platelets 150 - 400 K/uL 234  223  279        Latest Ref Rng & Units 10/23/2021   10:03 AM 08/22/2021    4:40 AM 08/21/2021   12:00 PM  CMP  Glucose 70 - 99 mg/dL 99  140  147   BUN 8 - 27 mg/dL '14  13  10   '$ Creatinine 0.76 - 1.27 mg/dL 1.09  0.97  1.03   Sodium 134 - 144 mmol/L 139  137  136   Potassium 3.5 - 5.2 mmol/L 4.7  4.7  4.1   Chloride 96 - 106 mmol/L 103  104  104   CO2 20 - 29 mmol/L '22  26  23   '$ Calcium 8.6 - 10.2 mg/dL 9.5  8.5  8.9   Total Protein 6.0 - 8.5 g/dL 6.3     Total Bilirubin 0.0 - 1.2 mg/dL 0.3     Alkaline Phos 44 - 121 IU/L 109     AST 0 - 40 IU/L 15     ALT 0 - 44 IU/L 24       Lipid Panel     Component Value Date/Time   CHOL 158 10/23/2021 1003   TRIG 125 10/23/2021 1003   HDL 43 10/23/2021 1003   CHOLHDL 3.7 10/23/2021 1003   LDLCALC 93 10/23/2021 1003   LABVLDL 22 10/23/2021 1003    No components found for: "NTPROBNP" No results for input(s): "PROBNP" in the last 8760 hours. Recent Labs    03/10/21 0944 10/23/21 1003  TSH 4.420 3.040    BMP Recent Labs    08/21/21 1015 08/21/21 1200 08/22/21 0440 10/23/21 1003  NA  --  136 137 139  K  --  4.1 4.7 4.7  CL  --  104 104 103  CO2  --  '23 26 22  '$ GLUCOSE  --  147* 140* 99  BUN  --  '10 13 14  '$ CREATININE 0.97 1.03 0.97 1.09  CALCIUM  --  8.9 8.5* 9.5  GFRNONAA >60 >60 >60  --     HEMOGLOBIN A1C Lab Results  Component Value Date   HGBA1C 5.9 (H) 03/10/2021    IMPRESSION:    ICD-10-CM   1. Dyspnea on exertion  R06.09     2. Abdominal aortic aneurysm 5.7 cm status post EVAR (08/21/2021)  I71.40     3. Status post endovascular aneurysm repair (EVAR)  Z98.890    Z86.79      4. Benign hypertension  I10     5. Hx of stroke  Z86.73     6. Atherosclerosis of aorta (HCC)  I70.0     7. Mixed hyperlipidemia  E78.2     8. Class 2 severe obesity due to excess calories with serious comorbidity and body mass index (BMI) of 37.0 to 37.9 in adult [facility])  E66.01    Z68.37         RECOMMENDATIONS: AARSH FRISTOE is a 66 y.o. male whose past medical history and cardiac risk factors include: History of PVCs, abdominal aortic aneurysm 5.7 cm status post EVAR (08/21/2021), history of stroke, former smoker, hypertension, hyperlipidemia, aortic atherosclerosis, obesity due to excess calories.  Dyspnea on exertion Chronic. Blood pressures are well controlled. Has undergone a cardiac evaluation including an echo and stress test results reviewed as part of medical decision making and noted above for further reference. Patient is encouraged to establish care with pulmonary medicine to rule out noncardiac causes of shortness of breath and also consider sleep study given his wife's concerns for excessive snoring and possible apnea at night.  Abdominal aortic aneurysm 5.7 cm status post EVAR (08/21/2021) Follows with vascular surgery. Status post EVAR. Currently on Plavix and statin therapy. He is a non-smoker.   Denies claudication  Benign hypertension Controlled.  No additional medication is warranted  Hx of stroke Given his prior history of bilateral CVAs and referral to cardiology for further evaluation. I discussed undergoing Holter versus loop recorder.   Patient and wife are still interested in undergoing loop recorder but  he would like to wait until the summer is over. He has no additional questions with regards to the scope of the procedure/risks, benefits, and alternatives. He will call us back if he would like to proceed with either Holter or loop implantation.  Atherosclerosis of aorta (HCC) Continue antiplatelet therapy and statins  FINAL MEDICATION LIST  END OF ENCOUNTER: No orders of the defined types were placed in this encounter.   There are no discontinued medications.    Current Outpatient Medications:    amLODipine (NORVASC) 10 MG tablet, Take 1 tablet (10 mg total) by mouth daily., Disp: 90 tablet, Rfl: 3   aspirin EC 81 MG tablet, Take 81 mg by mouth daily. Swallow whole., Disp: , Rfl:    atorvastatin (LIPITOR) 80 MG tablet, TAKE 1 TABLET (80 MG TOTAL) BY MOUTH DAILY., Disp: 90 tablet, Rfl: 1   clopidogrel (PLAVIX) 75 MG tablet, Take 1 tablet by mouth once daily., Disp: 30 tablet, Rfl: 11   divalproex (DEPAKOTE ER) 500 MG 24 hr tablet, TAKE 1 TABLET BY MOUTH AT BEDTIME., Disp: 90 tablet, Rfl: 2   LORazepam (ATIVAN) 0.5 MG tablet, TAKE 1 TABLET BY MOUTH 3 TIMES DAILY., Disp: 90 tablet, Rfl: 2   losartan (COZAAR) 50 MG tablet, Take 1 tablet (50 mg total) by mouth daily., Disp: 90 tablet, Rfl: 3   PARoxetine (PAXIL) 20 MG tablet, TAKE 1 TABLET BY MOUTH AT BEDTIME., Disp: 90 tablet, Rfl: 2   QUEtiapine (SEROQUEL) 50 MG tablet, TAKE 1 TABLET BY MOUTH AT BEDTIME., Disp: 90 tablet, Rfl: 2   tamsulosin (FLOMAX) 0.4 MG CAPS capsule, TAKE 1 CAPSULE BY MOUTH IN THE MORNING AND AT BEDTIME (Patient taking differently: 0.4 mg daily.), Disp: 180 capsule, Rfl: 1   traZODone (DESYREL) 50 MG tablet, TAKE 1 TABLET BY MOUTH AT BEDTIME., Disp: 90 tablet, Rfl: 2   venlafaxine XR (EFFEXOR-XR) 75 MG 24 hr capsule, Take 1 capsule (75 mg total) by mouth daily with breakfast., Disp: 90 capsule, Rfl: 2   vitamin C (ASCORBIC ACID) 500 MG tablet, Take 500 mg by mouth daily., Disp: , Rfl:    zinc gluconate 50 MG tablet, Take 50 mg by mouth daily., Disp: , Rfl:   No orders of the defined types were placed in this encounter.   There are no Patient Instructions on file for this visit.   --Continue cardiac medications as reconciled in final medication list. --Return in about 1 year (around 12/30/2022) for Annual follow up. . Or sooner if needed. --Continue  follow-up with your primary care physician regarding the management of your other chronic comorbid conditions.  Patient's questions and concerns were addressed to his satisfaction. He voices understanding of the instructions provided during this encounter.   This note was created using a voice recognition software as a result there may be grammatical errors inadvertently enclosed that do not reflect the nature of this encounter. Every attempt is made to correct such errors.  Rex Kras, [place], [facility]  Pager: [phone] Office: [phone]

## 2022-01-08 ENCOUNTER — Other Ambulatory Visit (HOSPITAL_COMMUNITY): Payer: Self-pay

## 2022-01-08 ENCOUNTER — Other Ambulatory Visit (HOSPITAL_COMMUNITY): Payer: Self-pay | Admitting: Psychiatry

## 2022-01-08 MED ORDER — LORAZEPAM 0.5 MG PO TABS
ORAL_TABLET | Freq: Three times a day (TID) | ORAL | 2 refills | Status: DC
Start: 2022-01-08 — End: 2022-04-10
  Filled 2022-01-08: qty 90, 30d supply, fill #0
  Filled 2022-02-07: qty 90, 30d supply, fill #1
  Filled 2022-03-12: qty 90, 30d supply, fill #2

## 2022-01-08 MED ORDER — PAROXETINE HCL 20 MG PO TABS
ORAL_TABLET | Freq: Every day | ORAL | 2 refills | Status: DC
  Filled 2022-01-08: qty 90, 90d supply, fill #0
  Filled 2022-04-03: qty 90, 90d supply, fill #1

## 2022-01-08 NOTE — Telephone Encounter (Signed)
Call for appt

## 2022-01-12 ENCOUNTER — Other Ambulatory Visit (HOSPITAL_COMMUNITY): Payer: Self-pay | Admitting: Psychiatry

## 2022-01-12 ENCOUNTER — Other Ambulatory Visit (HOSPITAL_COMMUNITY): Payer: Self-pay

## 2022-01-13 ENCOUNTER — Other Ambulatory Visit (HOSPITAL_COMMUNITY): Payer: Self-pay

## 2022-01-15 ENCOUNTER — Other Ambulatory Visit (HOSPITAL_COMMUNITY): Payer: Self-pay

## 2022-01-15 MED ORDER — TRAZODONE HCL 50 MG PO TABS
ORAL_TABLET | Freq: Every day | ORAL | 2 refills | Status: DC
Start: 2022-01-15 — End: 2022-04-12
  Filled 2022-01-15: qty 90, 90d supply, fill #0
  Filled 2022-04-03: qty 90, 90d supply, fill #1

## 2022-01-15 NOTE — Telephone Encounter (Signed)
He absolutely needs appt, not seen  in almost a year

## 2022-01-31 ENCOUNTER — Other Ambulatory Visit (HOSPITAL_COMMUNITY): Payer: Self-pay | Admitting: Psychiatry

## 2022-01-31 ENCOUNTER — Other Ambulatory Visit: Payer: Self-pay | Admitting: Family Medicine

## 2022-01-31 DIAGNOSIS — N401 Enlarged prostate with lower urinary tract symptoms: Secondary | ICD-10-CM

## 2022-02-01 ENCOUNTER — Other Ambulatory Visit (HOSPITAL_COMMUNITY): Payer: Self-pay

## 2022-02-01 MED ORDER — VENLAFAXINE HCL ER 75 MG PO CP24
75.0000 mg | ORAL_CAPSULE | Freq: Every day | ORAL | 2 refills | Status: DC
Start: 2022-02-01 — End: 2022-04-12
  Filled 2022-02-01: qty 90, 90d supply, fill #0

## 2022-02-01 MED ORDER — TAMSULOSIN HCL 0.4 MG PO CAPS
ORAL_CAPSULE | ORAL | 0 refills | Status: DC
  Filled 2022-02-01: qty 180, 90d supply, fill #0

## 2022-02-01 NOTE — Telephone Encounter (Signed)
Call for appt, no more r/fs without appt

## 2022-02-07 ENCOUNTER — Other Ambulatory Visit (HOSPITAL_COMMUNITY): Payer: Self-pay

## 2022-02-15 DIAGNOSIS — Z0100 Encounter for examination of eyes and vision without abnormal findings: Secondary | ICD-10-CM | POA: Diagnosis not present

## 2022-02-21 ENCOUNTER — Other Ambulatory Visit (HOSPITAL_COMMUNITY): Payer: Self-pay

## 2022-02-27 ENCOUNTER — Encounter (INDEPENDENT_AMBULATORY_CARE_PROVIDER_SITE_OTHER): Payer: Medicare HMO | Admitting: Ophthalmology

## 2022-02-27 DIAGNOSIS — H43813 Vitreous degeneration, bilateral: Secondary | ICD-10-CM

## 2022-02-27 DIAGNOSIS — H35033 Hypertensive retinopathy, bilateral: Secondary | ICD-10-CM | POA: Diagnosis not present

## 2022-02-27 DIAGNOSIS — I10 Essential (primary) hypertension: Secondary | ICD-10-CM | POA: Diagnosis not present

## 2022-02-27 DIAGNOSIS — H348122 Central retinal vein occlusion, left eye, stable: Secondary | ICD-10-CM

## 2022-03-01 ENCOUNTER — Other Ambulatory Visit (HOSPITAL_COMMUNITY): Payer: Self-pay

## 2022-03-06 ENCOUNTER — Other Ambulatory Visit (HOSPITAL_COMMUNITY): Payer: Self-pay

## 2022-03-12 ENCOUNTER — Other Ambulatory Visit (HOSPITAL_COMMUNITY): Payer: Self-pay

## 2022-03-16 ENCOUNTER — Other Ambulatory Visit (HOSPITAL_COMMUNITY): Payer: Self-pay

## 2022-04-03 ENCOUNTER — Other Ambulatory Visit (HOSPITAL_COMMUNITY): Payer: Self-pay | Admitting: Psychiatry

## 2022-04-03 ENCOUNTER — Other Ambulatory Visit: Payer: Self-pay | Admitting: Family Medicine

## 2022-04-03 ENCOUNTER — Other Ambulatory Visit (HOSPITAL_COMMUNITY): Payer: Self-pay

## 2022-04-03 DIAGNOSIS — I10 Essential (primary) hypertension: Secondary | ICD-10-CM

## 2022-04-03 MED ORDER — AMLODIPINE BESYLATE 10 MG PO TABS
10.0000 mg | ORAL_TABLET | Freq: Every day | ORAL | 0 refills | Status: DC
  Filled 2022-04-03: qty 90, 90d supply, fill #0

## 2022-04-03 NOTE — Telephone Encounter (Signed)
No refills until appointment scheduled

## 2022-04-04 ENCOUNTER — Other Ambulatory Visit (HOSPITAL_COMMUNITY): Payer: Self-pay

## 2022-04-04 ENCOUNTER — Ambulatory Visit (INDEPENDENT_AMBULATORY_CARE_PROVIDER_SITE_OTHER): Payer: Medicare HMO

## 2022-04-04 DIAGNOSIS — Z0000 Encounter for general adult medical examination without abnormal findings: Secondary | ICD-10-CM | POA: Diagnosis not present

## 2022-04-04 NOTE — Progress Notes (Signed)
MEDICARE ANNUAL WELLNESS VISIT  04/04/2022  Telephone Visit Disclaimer This Medicare AWV was conducted by telephone due to national recommendations for restrictions regarding the COVID-19 Pandemic (e.g. social distancing).  I verified, using two identifiers, that I am speaking with Lawrence Bennett or their authorized healthcare agent. I discussed the limitations, risks, security, and privacy concerns of performing an evaluation and management service by telephone and the potential availability of an in-person appointment in the future. The patient expressed understanding and agreed to proceed.  Location of Patient: Home Location of Provider (nurse):  [facility]  Subjective:    Lawrence Bennett is a 66 y.o. male patient of Lawrence Norlander, DO who had a Medicare Annual Wellness Visit today via telephone. [name] is Retired and lives with their spouse. He has three children. He reports that he is socially active and does interact with friends/family regularly. He is minimally physically active and enjoys woodworking and working on cars.  Patient Care Team: Lawrence Norlander, DO as PCP - General (Family Medicine) Cloria Spring, MD as Consulting Physician ([place]) Cameron Sprang, MD as Consulting Physician (Neurology) Lavonia Dana, MD as Referring Physician (Otolaryngology)     04/04/2022    1:17 PM 10/03/2021    3:29 PM 08/18/2021    3:01 PM 04/03/2021    1:26 PM 03/21/2021    2:02 PM 04/01/2020    2:00 PM 01/24/2020    8:44 PM  Advanced Directives  Does Patient Have a Medical Advance Directive? No No No No No No No  Would patient like information on creating a medical advance directive? No - Patient declined   No - Patient declined  No - Patient declined No - Patient declined    Hospital Utilization Over the Past 12 Months: # of hospitalizations or ER visits: 1 # of surgeries: 1  Review of Systems    Patient reports that his overall health is better compared to  last year.  History obtained from chart review and the patient  Patient Reported Readings (BP, Pulse, CBG, Weight, etc) none  Pain Assessment Pain : No/denies pain     Current Medications & Allergies (verified) Allergies as of 04/04/2022   No Known Allergies      Medication List        Accurate as of April 04, 2022  1:30 PM. If you have any questions, ask your nurse or doctor.          amLODipine 10 MG tablet Commonly known as: NORVASC Take 1 tablet (10 mg total) by mouth daily.   ascorbic acid 500 MG tablet Commonly known as: VITAMIN C Take 500 mg by mouth daily.   aspirin EC 81 MG tablet Take 81 mg by mouth daily. Swallow whole.   atorvastatin 80 MG tablet Commonly known as: LIPITOR TAKE 1 TABLET (80 MG TOTAL) BY MOUTH DAILY.   clopidogrel 75 MG tablet Commonly known as: PLAVIX Take 1 tablet by mouth once daily.   divalproex 500 MG 24 hr tablet Commonly known as: DEPAKOTE ER TAKE 1 TABLET BY MOUTH AT BEDTIME.   LORazepam 0.5 MG tablet Commonly known as: ATIVAN TAKE 1 TABLET BY MOUTH 3 TIMES DAILY.   losartan 50 MG tablet Commonly known as: COZAAR Take 1 tablet (50 mg total) by mouth daily.   PARoxetine 20 MG tablet Commonly known as: PAXIL TAKE 1 TABLET BY MOUTH AT BEDTIME.   QUEtiapine 50 MG tablet Commonly known as: SEROQUEL TAKE 1 TABLET BY MOUTH  AT BEDTIME.   tamsulosin 0.4 MG Caps capsule Commonly known as: FLOMAX TAKE 1 CAPSULE BY MOUTH IN THE MORNING AND AT BEDTIME   traZODone 50 MG tablet Commonly known as: DESYREL TAKE 1 TABLET BY MOUTH AT BEDTIME.   venlafaxine XR 75 MG 24 hr capsule Commonly known as: EFFEXOR-XR Take 1 capsule by mouth daily with breakfast.   zinc gluconate 50 MG tablet Take 50 mg by mouth daily.        History (reviewed): Past Medical History:  Diagnosis Date   AAA (abdominal aortic aneurysm) (HCC)    Anxiety    Atherosclerosis of aorta (HCC)    Atrial contractions, premature    Depression     Dyslipidemia    Dyspnea    Elevated glucose 11/11/2015   Hyperlipidemia    Mild hypertension    Stroke [facility])    Urinary hesitancy    Past Surgical History:  Procedure Laterality Date   ABDOMINAL AORTIC ENDOVASCULAR STENT GRAFT Bilateral 08/21/2021   Procedure: ENDOVASCULAR AORTIC STENT GRAFT REPAIR;  Surgeon: Broadus John, MD;  Location: [facility] OR;  Service: Vascular;  Laterality: Bilateral;   COLON RESECTION  2012   ULTRASOUND GUIDANCE FOR VASCULAR ACCESS Bilateral 08/21/2021   Procedure: ULTRASOUND GUIDANCE FOR VASCULAR ACCESS;  Surgeon: Broadus John, MD;  Location: [facility] OR;  Service: Vascular;  Laterality: Bilateral;   Family History  Problem Relation Age of Onset   COPD Mother    Stroke Father    Bipolar disorder Father    Hypertension Sister    Depression Sister    Colon cancer Neg Hx    Esophageal cancer Neg Hx    Inflammatory bowel disease Neg Hx    Liver disease Neg Hx    Rectal cancer Neg Hx    Pancreatic cancer Neg Hx    Stomach cancer Neg Hx    Social History   Socioeconomic History   Marital status: Married    Spouse name: Not on file   Number of children: 3   Years of education: Not on file   Highest education level: Not on file  Occupational History   Occupation: retired  Tobacco Use   Smoking status: Former    Packs/day: 0.20    Years: 19.00    Total pack years: 3.80    Types: Cigarettes    Quit date: 2015    Years since quitting: 8.7   Smokeless tobacco: Never  Vaping Use   Vaping Use: Never used  Substance and Sexual Activity   Alcohol use: No    Alcohol/week: 0.0 standard drinks of alcohol   Drug use: No   Sexual activity: Not Currently  Other Topics Concern   Not on file  Social History Narrative   Right handed   Lives with wife   Social Determinants of Health   Financial Resource Strain: Low Risk  (04/03/2021)   Overall Financial Resource Strain (CARDIA)    Difficulty of Paying Living Expenses: Not hard at all  Food Insecurity: No  Food Insecurity (04/03/2021)   Hunger Vital Sign    Worried About Estate manager/land agent of Food in the Last Year: Never true    Ran Out of Food in the Last Year: Never true  Transportation Needs: No Transportation Needs (04/03/2021)   PRAPARE - Hydrologist (Medical): No    Lack of Transportation (Non-Medical): No  Physical Activity: Inactive (04/03/2021)   Exercise Vital Sign    Days of Exercise per Week: 0  days    Minutes of Exercise per Session: 0 min  Stress: No Stress Concern Present (04/03/2021)   [place]    Feeling of Stress : Only a little  Social Connections: Socially Integrated (04/03/2021)   Social Connection and Isolation Panel [NHANES]    Frequency of Communication with Friends and Family: More than three times a week    Frequency of Social Gatherings with Friends and Family: More than three times a week    Attends Religious Services: 1 to 4 times per year    Active Member of [employer] or Organizations: Yes    Attends Archivist Meetings: 1 to 4 times per year    Marital Status: Married    Activities of Daily Living    04/04/2022    1:19 PM 08/18/2021    3:05 PM  In your present state of health, do you have any difficulty performing the following activities:  Hearing? 1   Vision? 0   Difficulty concentrating or making decisions? 0   Walking or climbing stairs? 1   Dressing or bathing? 0   Doing errands, shopping? 0 0  Preparing Food and eating ? N   Using the Toilet? N   In the past six months, have you accidently leaked urine? N   Do you have problems with loss of bowel control? N   Managing your Medications? N   Managing your Finances? N   Housekeeping or managing your Housekeeping? N     Patient Education/ Literacy How often do you need to have someone help you when you read instructions, pamphlets, or other written materials from your doctor or pharmacy?: 1 -  Never What is the last grade level you completed in school?: 12th grade  Exercise Current Exercise Habits: Home exercise routine, Type of exercise: walking, Time (Minutes): 30, Frequency (Times/Week): 7, Weekly Exercise (Minutes/Week): 210, Intensity: Mild, Exercise limited by: None identified  Diet Patient reports consuming 3 meals a day and 0 snack(s) a day Patient reports that his primary diet is: Regular Patient reports that he does have regular access to food.   Depression Screen    04/04/2022    1:30 PM 10/12/2021    1:04 PM 04/11/2021    2:33 PM 04/03/2021    1:24 PM 02/13/2021    1:43 PM 12/16/2020    2:43 PM 10/04/2020    1:52 PM  PHQ 2/9 Scores  PHQ - 2 Score 0 0 0 0  0 0  PHQ- 9 Score      0      Information is confidential and restricted. Go to Review Flowsheets to unlock data.     Fall Risk    04/04/2022    1:29 PM 10/12/2021    1:04 PM 10/03/2021    3:29 PM 04/11/2021    2:33 PM 04/03/2021    1:21 PM  [place] in the past year? 0 0 '1 1 1  '$ Number falls in past yr:   '1 1 1  '$ Injury with Fall?   0 1 1  Risk for fall due to :    History of fall(s) History of fall(s);Orthopedic patient;Medication side effect  Follow up Falls evaluation completed   Education provided Education provided;Falls prevention discussed     Objective:  Lawrence Bennett seemed alert and oriented and he participated appropriately during our telephone visit.  Blood Pressure Weight BMI  BP Readings from Last 3 Encounters:  12/29/21 131/72  10/12/21 131/77  10/03/21 (!) 145/82   Wt Readings from Last 3 Encounters:  12/29/21 258 lb 12.8 oz (117.4 kg)  10/12/21 253 lb 6.4 oz (114.9 kg)  10/03/21 252 lb 6.4 oz (114.5 kg)   BMI Readings from Last 1 Encounters:  12/29/21 37.13 kg/m    *Unable to obtain current vital signs, weight, and BMI due to telephone visit type  Hearing/Vision  Lavante did not seem to have difficulty with hearing/understanding during the telephone  conversation Reports that he has had a formal eye exam by an eye care professional within the past year Reports that he has had a formal hearing evaluation within the past year *Unable to fully assess hearing and vision during telephone visit type  Cognitive Function:    04/04/2022    1:25 PM 04/03/2021    1:29 PM 04/01/2020    2:04 PM  6CIT Screen  What Year? 0 points 0 points 0 points  What month? 0 points 0 points 0 points  What time? 0 points 0 points 0 points  Count back from 20 0 points 0 points 0 points  Months in reverse 0 points 2 points 0 points  Repeat phrase 0 points 0 points 0 points  Total Score 0 points 2 points 0 points   (Normal:0-7, Significant for Dysfunction: >8)  Normal Cognitive Function Screening: Yes   Immunization & Health Maintenance Record Immunization History  Administered Date(s) Administered   Fluad Quad(high Dose 65+) 06/10/2020, 04/11/2021   Influenza Inj Mdck Quad Pf 03/24/2018   Influenza,inj,Quad PF,6+ Mos 03/23/2016, 03/26/2017, 03/26/2019   PFIZER Comirnaty(Gray Top)Covid-19 Tri-Sucrose Vaccine 11/12/2019, 12/03/2019, 06/20/2020   Pneumococcal Conjugate-13 05/26/2018   Pneumococcal Polysaccharide-23 10/07/2019   Tdap 04/23/2018   Zoster Recombinat (Shingrix) 04/11/2021, 10/12/2021    Health Maintenance  Topic Date Due   COVID-19 Vaccine (4 - Pfizer series) 08/15/2020   COLONOSCOPY (Pts 45-29yr Insurance coverage will need to be confirmed)  04/11/2022 (Originally 04/04/2020)   INFLUENZA VACCINE  09/30/2022 (Originally 01/30/2022)   TETANUS/TDAP  04/23/2028   Pneumonia Vaccine 67 Years old  Completed   Hepatitis C Screening  Completed   Zoster Vaccines- Shingrix  Completed   HPV VACCINES  Aged Out       Assessment  This is a routine wellness examination for [place]  Health Maintenance: Due or Overdue Health Maintenance Due  Topic Date Due   COVID-19 Vaccine (4 - Pfizer series) 08/15/2020    MDonah Driverdoes not  need a referral for Community Assistance: Care Management:   no Social Work:    no Prescription Assistance:  no Nutrition/Diabetes Education:  no   Plan:  Personalized Goals  Goals Addressed             This Visit's Progress    Patient Stated       04/04/2022 AWV Goal: Fall Prevention  Over the next year, patient will decrease their risk for falls by: Using assistive devices, such as a cane or walker, as needed Identifying fall risks within their home and correcting them by: Removing throw rugs Adding handrails to stairs or ramps Removing clutter and keeping a clear pathway throughout the home Increasing light, especially at night Adding shower handles/bars Raising toilet seat Identifying potential personal risk factors for falls: Medication side effects Incontinence/urgency Vestibular dysfunction Hearing loss Musculoskeletal disorders Neurological disorders Orthostatic hypotension         Personalized Health Maintenance & Screening Recommendations  Influenza vaccine Colorectal cancer screening  Lung Cancer Screening Recommended: no (Low Dose CT Chest recommended if Age 14-80 years, 30 pack-year currently smoking OR have quit w/in past 15 years) Hepatitis C Screening recommended: no HIV Screening recommended: no  Advanced Directives: Written information was not prepared per patient's request.  Referrals & Orders No orders of the defined types were placed in this encounter.   Follow-up Plan Follow-up with Lawrence Norlander, DO as planned Schedule colonoscopy   I have personally reviewed and noted the following in the patient's chart:   Medical and social history Use of alcohol, tobacco or illicit drugs  Current medications and supplements Functional ability and status Nutritional status Physical activity Advanced directives List of other physicians Hospitalizations, surgeries, and ER visits in previous 12 months Vitals Screenings to include  cognitive, depression, and falls Referrals and appointments  In addition, I have reviewed and discussed with Lawrence Bennett certain preventive protocols, quality metrics, and best practice recommendations. A written personalized care plan for preventive services as well as general preventive health recommendations is available and can be mailed to the patient at his request.      Felicity Coyer LPN   03/09/1190   Patient declined after visit summary

## 2022-04-10 ENCOUNTER — Other Ambulatory Visit (HOSPITAL_COMMUNITY): Payer: Self-pay

## 2022-04-10 ENCOUNTER — Other Ambulatory Visit (HOSPITAL_COMMUNITY): Payer: Self-pay | Admitting: Psychiatry

## 2022-04-10 MED ORDER — LORAZEPAM 0.5 MG PO TABS
ORAL_TABLET | Freq: Three times a day (TID) | ORAL | 0 refills | Status: DC
  Filled 2022-04-10: qty 90, 30d supply, fill #0

## 2022-04-11 ENCOUNTER — Other Ambulatory Visit (HOSPITAL_COMMUNITY): Payer: Self-pay

## 2022-04-12 ENCOUNTER — Other Ambulatory Visit (HOSPITAL_COMMUNITY): Payer: Self-pay

## 2022-04-12 ENCOUNTER — Ambulatory Visit (HOSPITAL_COMMUNITY): Payer: Medicare HMO | Admitting: Psychiatry

## 2022-04-12 ENCOUNTER — Telehealth (HOSPITAL_COMMUNITY): Payer: Medicare HMO | Admitting: Psychiatry

## 2022-04-12 ENCOUNTER — Encounter (HOSPITAL_COMMUNITY): Payer: Self-pay | Admitting: Psychiatry

## 2022-04-12 DIAGNOSIS — F3160 Bipolar disorder, current episode mixed, unspecified: Secondary | ICD-10-CM

## 2022-04-12 MED ORDER — VENLAFAXINE HCL ER 75 MG PO CP24
75.0000 mg | ORAL_CAPSULE | Freq: Every day | ORAL | 2 refills | Status: DC
  Filled 2022-04-12 – 2022-05-01 (×2): qty 90, 90d supply, fill #0
  Filled 2022-07-21 – 2022-07-23 (×2): qty 90, 90d supply, fill #1
  Filled 2022-10-29: qty 90, 90d supply, fill #2

## 2022-04-12 MED ORDER — DIVALPROEX SODIUM ER 500 MG PO TB24
500.0000 mg | ORAL_TABLET | Freq: Every day | ORAL | 2 refills | Status: DC
  Filled 2022-04-12 – 2022-06-11 (×3): qty 90, 90d supply, fill #0
  Filled 2022-09-06: qty 90, 90d supply, fill #1

## 2022-04-12 MED ORDER — QUETIAPINE FUMARATE 50 MG PO TABS
50.0000 mg | ORAL_TABLET | Freq: Every day | ORAL | 2 refills | Status: DC
  Filled 2022-04-12: qty 90, fill #0
  Filled 2022-05-01 – 2022-05-16 (×2): qty 90, 90d supply, fill #0
  Filled 2022-08-24: qty 90, 90d supply, fill #1

## 2022-04-12 MED ORDER — LORAZEPAM 0.5 MG PO TABS
0.5000 mg | ORAL_TABLET | Freq: Three times a day (TID) | ORAL | 2 refills | Status: DC
Start: 2022-04-12 — End: 2022-07-05
  Filled 2022-04-12: qty 90, 30d supply, fill #0
  Filled 2022-05-16: qty 90, 30d supply, fill #1
  Filled 2022-06-11: qty 90, 30d supply, fill #2

## 2022-04-12 MED ORDER — PAROXETINE HCL 20 MG PO TABS
20.0000 mg | ORAL_TABLET | Freq: Every day | ORAL | 2 refills | Status: DC
  Filled 2022-04-12: qty 90, fill #0
  Filled 2022-05-16 – 2022-07-03 (×2): qty 90, 90d supply, fill #0
  Filled 2022-09-27: qty 90, 90d supply, fill #1

## 2022-04-12 MED ORDER — TRAZODONE HCL 50 MG PO TABS
50.0000 mg | ORAL_TABLET | Freq: Every day | ORAL | 2 refills | Status: DC
  Filled 2022-04-12: qty 90, fill #0
  Filled 2022-05-16 – 2022-07-03 (×3): qty 90, 90d supply, fill #0
  Filled 2022-09-27: qty 90, 90d supply, fill #1

## 2022-04-12 NOTE — Progress Notes (Signed)
Virtual Visit via Telephone Note  I connected with Lawrence Bennett on 04/12/22 at  9:40 AM EDT by telephone and verified that I am speaking with the correct person using two identifiers.  Location: Patient: home Provider: office   I discussed the limitations, risks, security and privacy concerns of performing an evaluation and management service by telephone and the availability of in person appointments. I also discussed with the patient that there may be a patient responsible charge related to this service. The patient expressed understanding and agreed to proceed.     I discussed the assessment and treatment plan with the patient. The patient was provided an opportunity to ask questions and all were answered. The patient agreed with the plan and demonstrated an understanding of the instructions.   The patient was advised to call back or seek an in-person evaluation if the symptoms worsen or if the condition fails to improve as anticipated.  I provided 15 minutes of non-face-to-face time during this encounter.   Levonne Spiller, MD  [facility] MD/PA/NP OP Progress Note  04/12/2022 10:10 AM Lawrence Bennett  MRN:  [id number]  Chief Complaint:  Chief Complaint  Patient presents with   Anxiety   Depression   Follow-up   HPI: This patient is a 66 year old married white male who lives with his wife and 3 children in [place]. 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  The patient returns after long absence.  He was last seen about 14 months ago.  He is following up regarding bipolar disorder.  He states he has had a somewhat eventful year.  Unfortunately he had to have a stent put in last February for an aortic aneurysm.  He off also suffered a small lacunar stroke.  He had a lot of balance issues and had to do physical therapy.  His gait is much better by his report now.  He is back to doing most things he enjoys doing.  He states that his mood  has been good he denies significant depression or anxiety.  He denies auditory visual hallucinations paranoia or thoughts of self-harm or suicide.  Thoughts are well organized Visit Diagnosis:    ICD-10-CM   1. Bipolar I disorder, most recent episode mixed ([place])  F31.60       Past Psychiatric History: Hospitalization in the geropsychiatry unit in 2015  Past Medical History:  Past Medical History:  Diagnosis Date   AAA (abdominal aortic aneurysm) ([place])    Anxiety    Atherosclerosis of aorta (HCC)    Atrial contractions, premature    Depression    Dyslipidemia    Dyspnea    Elevated glucose 11/11/2015   Hyperlipidemia    Mild hypertension    Stroke [facility])    Urinary hesitancy     Past Surgical History:  Procedure Laterality Date   ABDOMINAL AORTIC ENDOVASCULAR STENT GRAFT Bilateral 08/21/2021   Procedure: ENDOVASCULAR AORTIC STENT GRAFT REPAIR;  Surgeon: Broadus John, MD;  Location: [place];  Service: Vascular;  Laterality: Bilateral;   COLON RESECTION  2012   ULTRASOUND GUIDANCE FOR VASCULAR ACCESS Bilateral 08/21/2021   Procedure: ULTRASOUND GUIDANCE FOR VASCULAR ACCESS;  Surgeon: Broadus John, MD;  Location: [facility] OR;  Service: Vascular;  Laterality: Bilateral;    Family Psychiatric History: See below  Family History:  Family History  Problem Relation Age of Onset   COPD Mother    Stroke Father    Bipolar  disorder Father    Hypertension Sister    Depression Sister    Colon cancer Neg Hx    Esophageal cancer Neg Hx    Inflammatory bowel disease Neg Hx    Liver disease Neg Hx    Rectal cancer Neg Hx    Pancreatic cancer Neg Hx    Stomach cancer Neg Hx     Social History:  Social History   Socioeconomic History   Marital status: Married    Spouse name: Not on file   Number of children: 3   Years of education: Not on file   Highest education level: Not on file  Occupational History   Occupation: retired  Tobacco Use   Smoking status: Former    Packs/day:  0.20    Years: 19.00    Total pack years: 3.80    Types: Cigarettes    Quit date: 2015    Years since quitting: 8.7   Smokeless tobacco: Never  Vaping Use   Vaping Use: Never used  Substance and Sexual Activity   Alcohol use: No    Alcohol/week: 0.0 standard drinks of alcohol   Drug use: No   Sexual activity: Not Currently  Other Topics Concern   Not on file  Social History Narrative   Right handed   Lives with wife   Social Determinants of Health   Financial Resource Strain: Low Risk  (04/03/2021)   Overall Financial Resource Strain (CARDIA)    Difficulty of Paying Living Expenses: Not hard at all  Food Insecurity: No Food Insecurity (04/03/2021)   Hunger Vital Sign    Worried About Running Out of Food in the Last Year: Never true    Ran Out of Food in the Last Year: Never true  Transportation Needs: No Transportation Needs (04/03/2021)   PRAPARE - Hydrologist (Medical): No    Lack of Transportation (Non-Medical): No  Physical Activity: Inactive (04/03/2021)   Exercise Vital Sign    Days of Exercise per Week: 0 days    Minutes of Exercise per Session: 0 min  Stress: No Stress Concern Present (04/03/2021)   [place]    Feeling of Stress : Only a little  Social Connections: Socially Integrated (04/03/2021)   Social Connection and Isolation Panel [NHANES]    Frequency of Communication with Friends and Family: More than three times a week    Frequency of Social Gatherings with Friends and Family: More than three times a week    Attends Religious Services: 1 to 4 times per year    Active Member of Clubs or Organizations: Yes    Attends Archivist Meetings: 1 to 4 times per year    Marital Status: Married    Allergies: No Known Allergies  Metabolic Disorder Labs: Lab Results  Component Value Date   HGBA1C 5.9 (H) 03/10/2021   No results found for: "PROLACTIN" Lab  Results  Component Value Date   CHOL 158 10/23/2021   TRIG 125 10/23/2021   HDL 43 10/23/2021   CHOLHDL 3.7 10/23/2021   [place] 93 10/23/2021   [place] 92 10/07/2020   Lab Results  Component Value Date   TSH 3.040 10/23/2021   TSH 4.420 03/10/2021    Therapeutic Level Labs: No results found for: "LITHIUM" Lab Results  Component Value Date   VALPROATE 41.8 (L) 11/29/2016   VALPROATE 44 (L) 11/11/2015   No results found for: "CBMZ"  Current Medications:  Current Outpatient Medications  Medication Sig Dispense Refill   amLODipine (NORVASC) 10 MG tablet Take 1 tablet (10 mg total) by mouth daily. 90 tablet 0   aspirin EC 81 MG tablet Take 81 mg by mouth daily. Swallow whole.     atorvastatin (LIPITOR) 80 MG tablet TAKE 1 TABLET (80 MG TOTAL) BY MOUTH DAILY. 90 tablet 1   clopidogrel (PLAVIX) 75 MG tablet Take 1 tablet by mouth once daily. 30 tablet 11   divalproex (DEPAKOTE ER) 500 MG 24 hr tablet TAKE 1 TABLET BY MOUTH AT BEDTIME. 90 tablet 2   LORazepam (ATIVAN) 0.5 MG tablet TAKE 1 TABLET BY MOUTH 3 TIMES DAILY. 90 tablet 2   losartan (COZAAR) 50 MG tablet Take 1 tablet (50 mg total) by mouth daily. 90 tablet 3   PARoxetine (PAXIL) 20 MG tablet TAKE 1 TABLET BY MOUTH AT BEDTIME. 90 tablet 2   QUEtiapine (SEROQUEL) 50 MG tablet TAKE 1 TABLET BY MOUTH AT BEDTIME. 90 tablet 2   tamsulosin (FLOMAX) 0.4 MG CAPS capsule TAKE 1 CAPSULE BY MOUTH IN THE MORNING AND AT BEDTIME 180 capsule 0   traZODone (DESYREL) 50 MG tablet TAKE 1 TABLET BY MOUTH AT BEDTIME. 90 tablet 2   venlafaxine XR (EFFEXOR-XR) 75 MG 24 hr capsule Take 1 capsule by mouth daily with breakfast. 90 capsule 2   vitamin C (ASCORBIC ACID) 500 MG tablet Take 500 mg by mouth daily.     zinc gluconate 50 MG tablet Take 50 mg by mouth daily.     No current facility-administered medications for this visit.     Musculoskeletal: Strength & Muscle Tone: na Gait & Station: na Patient leans: N/A  Psychiatric Specialty  Exam: Review of Systems  All other systems reviewed and are negative.   There were no vitals taken for this visit.There is no height or weight on file to calculate BMI.  General Appearance: NA  Eye Contact:  NA  Speech:  Clear and Coherent  Volume:  Normal  Mood:  Euthymic  Affect:  NA  Thought Process:  Goal Directed  Orientation:  Full (Time, Place, and Person)  Thought Content: WDL   Suicidal Thoughts:  No  Homicidal Thoughts:  No  Memory:  Immediate;   Good Recent;   Good Remote;   Fair  Judgement:  Good  Insight:  Good  Psychomotor Activity:  Decreased  Concentration:  Concentration: Good and Attention Span: Good  Recall:  Good  Fund of Knowledge: Good  Language: Good  Akathisia:  No  Handed:  Right  AIMS (if indicated): not done  Assets:  Communication Skills Desire for Improvement Resilience Social Support Talents/Skills  ADL's:  Intact  Cognition: WNL  Sleep:  Good   Screenings: GAD-7    Flowsheet Row Office Visit from 10/12/2021 in [place] Visit from 04/11/2021 in [place] Office Visit from 12/16/2020 in [place] Office Visit from 08/26/2018 in [place] Visit from 05/26/2018 in [facility]  Total GAD-7 Score 0 0 0 0 3      PHQ2-9    Flowsheet Row Video Visit from 04/12/2022 in [place] from 04/04/2022 in [place] Office Visit from 10/12/2021 in [place] Office Visit from 04/11/2021 in [place] from 04/03/2021 in [facility]  PHQ-2 Total Score 0 0 0 0 0  Flowsheet Row Pre-Admission Testing 60 from 08/18/2021 in [facility] PREADMISSION TESTING Video Visit from 02/13/2021 in [place] No Risk No Risk        Assessment and Plan: This patient is a 66 year old male with a history of severe depression requiring ECT in the past.  However despite his recent health issues he is doing well in terms of mental status.  He will continue lorazepam 0.5 mg 3 times daily for anxiety, Paxil 20 mg nightly for depression, Seroquel 50 mg at night for mood stabilization, trazodone 50 mg at bedtime for sleep, Effexor XR 75 mg daily for depression and Depakote ER 500 mg at bedtime for mood stabilization.  His recent labs have all been normal.  He will return to see me in 6 months  Collaboration of Care: Collaboration of Care: Primary Care Provider AEB notes are shared with PCP on the epic system  Patient/Guardian was advised Release of Information must be obtained prior to any record release in order to collaborate their care with an outside provider. Patient/Guardian was advised if they have not already done so to contact the registration department to sign all necessary forms in order for [place] to release information regarding their care.   Consent: Patient/Guardian gives verbal consent for treatment and assignment of benefits for services provided during this visit. Patient/Guardian expressed understanding and agreed to proceed.    Levonne Spiller, MD 04/12/2022, 10:10 AM

## 2022-04-17 ENCOUNTER — Ambulatory Visit (INDEPENDENT_AMBULATORY_CARE_PROVIDER_SITE_OTHER): Payer: Medicare HMO | Admitting: Family Medicine

## 2022-04-17 ENCOUNTER — Other Ambulatory Visit (HOSPITAL_COMMUNITY): Payer: Self-pay

## 2022-04-17 ENCOUNTER — Encounter: Payer: Self-pay | Admitting: Family Medicine

## 2022-04-17 VITALS — BP 129/72 | HR 83 | Temp 97.9°F | Ht 70.0 in | Wt 255.4 lb

## 2022-04-17 DIAGNOSIS — Z23 Encounter for immunization: Secondary | ICD-10-CM | POA: Diagnosis not present

## 2022-04-17 DIAGNOSIS — H903 Sensorineural hearing loss, bilateral: Secondary | ICD-10-CM

## 2022-04-17 DIAGNOSIS — E782 Mixed hyperlipidemia: Secondary | ICD-10-CM

## 2022-04-17 DIAGNOSIS — R350 Frequency of micturition: Secondary | ICD-10-CM | POA: Diagnosis not present

## 2022-04-17 DIAGNOSIS — R7303 Prediabetes: Secondary | ICD-10-CM

## 2022-04-17 DIAGNOSIS — Z0001 Encounter for general adult medical examination with abnormal findings: Secondary | ICD-10-CM

## 2022-04-17 DIAGNOSIS — L853 Xerosis cutis: Secondary | ICD-10-CM | POA: Diagnosis not present

## 2022-04-17 DIAGNOSIS — Z0000 Encounter for general adult medical examination without abnormal findings: Secondary | ICD-10-CM

## 2022-04-17 DIAGNOSIS — Z8673 Personal history of transient ischemic attack (TIA), and cerebral infarction without residual deficits: Secondary | ICD-10-CM

## 2022-04-17 DIAGNOSIS — B350 Tinea barbae and tinea capitis: Secondary | ICD-10-CM

## 2022-04-17 MED ORDER — KETOCONAZOLE 2 % EX CREA
1.0000 | TOPICAL_CREAM | Freq: Every day | CUTANEOUS | 0 refills | Status: DC
  Filled 2022-04-17: qty 60, 28d supply, fill #0

## 2022-04-17 NOTE — Progress Notes (Signed)
Lawrence Bennett is a 66 y.o. male presents to office today for annual physical exam examination.    Concerns today include: 1.  Rash Patient reports that he has been having some rash along the forehead and chin.  He also has some itchiness along his back.  He did not appreciate a rash on his back.  He feels like maybe this started after he started using old spice body wash.  He does not really moisturize with anything.  He intact scratch so much on his forehead that it caused him to bleed.  He is treated with Plavix and notes easy bleeding and bruising since being on this for his stent.  2.  Frequent urination Patient reports urinary frequency.  This is been refractory to use of Flomax.  He does report feeling like he empties his bladder but could go almost every hour if allowed.  He drinks a lot of fluid.  He is not on any fluid pills.  No dysuria or hematuria reported.  Occupation: Retired.  Substance use: None Diet: Low-fat, low-carb and high-fiber, Exercise: His daughter pushes him to exercise but he admits that he does take a little encouragement.  He has a treadmill and recumbent bike at home Last colonoscopy: due Refills needed today: None Eye exam: <1 year with Dr Marin Comment, got new glasses. Immunizations needed: Flu vaccine Immunization History  Administered Date(s) Administered   Fluad Quad(high Dose 65+) 06/10/2020, 04/11/2021   Influenza Inj Mdck Quad Pf 03/24/2018   Influenza,inj,Quad PF,6+ Mos 03/23/2016, 03/26/2017, 03/26/2019   PFIZER Comirnaty(Gray Top)Covid-19 Tri-Sucrose Vaccine 11/12/2019, 12/03/2019, 06/20/2020   Pneumococcal Conjugate-13 05/26/2018   Pneumococcal Polysaccharide-23 10/07/2019   Tdap 04/23/2018   Zoster Recombinat (Shingrix) 04/11/2021, 10/12/2021     Past Medical History:  Diagnosis Date   AAA (abdominal aortic aneurysm) (HCC)    Anxiety    Atherosclerosis of aorta (HCC)    Atrial contractions, premature    Depression    Dyslipidemia    Dyspnea     Elevated glucose 11/11/2015   Hyperlipidemia    Mild hypertension    Stroke ([place])    Urinary hesitancy    Social History   Socioeconomic History   Marital status: Married    Spouse name: Not on file   Number of children: 3   Years of education: Not on file   Highest education level: Not on file  Occupational History   Occupation: retired  Tobacco Use   Smoking status: Former    Packs/day: 0.20    Years: 19.00    Total pack years: 3.80    Types: Cigarettes    Quit date: 2015    Years since quitting: 8.7   Smokeless tobacco: Never  Vaping Use   Vaping Use: Never used  Substance and Sexual Activity   Alcohol use: No    Alcohol/week: 0.0 standard drinks of alcohol   Drug use: No   Sexual activity: Not Currently  Other Topics Concern   Not on file  Social History Narrative   Right handed   Lives with wife   Social Determinants of Health   Financial Resource Strain: Low Risk  (04/03/2021)   Overall Financial Resource Strain (CARDIA)    Difficulty of Paying Living Expenses: Not hard at all  Food Insecurity: No Food Insecurity (04/03/2021)   Hunger Vital Sign    Worried About Running Out of Food in the Last Year: Never true    Ran Out of Food in the Last Year: Never true  Transportation Needs: No Transportation Needs (04/03/2021)   PRAPARE - Hydrologist (Medical): No    Lack of Transportation (Non-Medical): No  Physical Activity: Inactive (04/03/2021)   Exercise Vital Sign    Days of Exercise per Week: 0 days    Minutes of Exercise per Session: 0 min  Stress: No Stress Concern Present (04/03/2021)   [name]    Feeling of Stress : Only a little  Social Connections: Socially Integrated (04/03/2021)   Social Connection and Isolation Panel [NHANES]    Frequency of Communication with Friends and Family: More than three times a week    Frequency of Social Gatherings with  Friends and Family: More than three times a week    Attends Religious Services: 1 to 4 times per year    Active Member of Clubs or Organizations: Yes    Attends Archivist Meetings: 1 to 4 times per year    Marital Status: Married  Human resources officer Violence: Not At Risk (04/03/2021)   Humiliation, Afraid, Rape, and Kick questionnaire    Fear of Current or Ex-Partner: No    Emotionally Abused: No    Physically Abused: No    Sexually Abused: No   Past Surgical History:  Procedure Laterality Date   ABDOMINAL AORTIC ENDOVASCULAR STENT GRAFT Bilateral 08/21/2021   Procedure: ENDOVASCULAR AORTIC STENT GRAFT REPAIR;  Surgeon: Broadus John, MD;  Location: [place];  Service: Vascular;  Laterality: Bilateral;   COLON RESECTION  2012   ULTRASOUND GUIDANCE FOR VASCULAR ACCESS Bilateral 08/21/2021   Procedure: ULTRASOUND GUIDANCE FOR VASCULAR ACCESS;  Surgeon: Broadus John, MD;  Location: [facility] OR;  Service: Vascular;  Laterality: Bilateral;   Family History  Problem Relation Age of Onset   COPD Mother    Stroke Father    Bipolar disorder Father    Hypertension Sister    Depression Sister    Colon cancer Neg Hx    Esophageal cancer Neg Hx    Inflammatory bowel disease Neg Hx    Liver disease Neg Hx    Rectal cancer Neg Hx    Pancreatic cancer Neg Hx    Stomach cancer Neg Hx     Current Outpatient Medications:    amLODipine (NORVASC) 10 MG tablet, Take 1 tablet (10 mg total) by mouth daily., Disp: 90 tablet, Rfl: 0   aspirin EC 81 MG tablet, Take 81 mg by mouth daily. Swallow whole., Disp: , Rfl:    atorvastatin (LIPITOR) 80 MG tablet, TAKE 1 TABLET (80 MG TOTAL) BY MOUTH DAILY., Disp: 90 tablet, Rfl: 1   clopidogrel (PLAVIX) 75 MG tablet, Take 1 tablet by mouth once daily., Disp: 30 tablet, Rfl: 11   divalproex (DEPAKOTE ER) 500 MG 24 hr tablet, Take 1 tablet (500 mg total) by mouth at bedtime., Disp: 90 tablet, Rfl: 2   LORazepam (ATIVAN) 0.5 MG tablet, Take 1 tablet (0.5 mg  total) by mouth 3 (three) times daily., Disp: 90 tablet, Rfl: 2   losartan (COZAAR) 50 MG tablet, Take 1 tablet (50 mg total) by mouth daily., Disp: 90 tablet, Rfl: 3   PARoxetine (PAXIL) 20 MG tablet, Take 1 tablet (20 mg total) by mouth at bedtime., Disp: 90 tablet, Rfl: 2   QUEtiapine (SEROQUEL) 50 MG tablet, Take 1 tablet (50 mg total) by mouth at bedtime., Disp: 90 tablet, Rfl: 2   tamsulosin (FLOMAX) 0.4 MG CAPS capsule, TAKE 1 CAPSULE BY MOUTH  IN THE MORNING AND AT BEDTIME, Disp: 180 capsule, Rfl: 0   traZODone (DESYREL) 50 MG tablet, Take 1 tablet (50 mg total) by mouth at bedtime., Disp: 90 tablet, Rfl: 2   venlafaxine XR (EFFEXOR-XR) 75 MG 24 hr capsule, Take 1 capsule (75 mg total) by mouth daily with breakfast., Disp: 90 capsule, Rfl: 2   vitamin C (ASCORBIC ACID) 500 MG tablet, Take 500 mg by mouth daily., Disp: , Rfl:    zinc gluconate 50 MG tablet, Take 50 mg by mouth daily., Disp: , Rfl:   No Known Allergies   ROS: Review of Systems Pertinent items noted in HPI and remainder of comprehensive ROS otherwise negative.    Physical exam BP 129/72   Pulse 83   Temp 97.9 F (36.6 C)   Ht '5\' 10"'$  (1.778 m)   Wt 255 lb 6.4 oz (115.8 kg)   SpO2 94%   BMI 36.65 kg/m  General appearance: alert, cooperative, appears stated age, no distress, and morbidly obese Head: Normocephalic, without obvious abnormality, atraumatic Eyes: negative findings: lids and lashes normal, conjunctivae and sclerae normal, corneas clear, and pupils equal, round, reactive to light and accomodation Ears:  Right external auditory canal with abrasion that was actively bleeding on exam.  Left TM obscured by dried cerumen Nose: Nares normal. Septum midline. Mucosa normal. No drainage or sinus tenderness. Throat: lips, mucosa, and tongue normal; teeth and gums normal Neck: no adenopathy, no carotid bruit, supple, symmetrical, trachea midline, and thyroid not enlarged, symmetric, no tenderness/mass/nodules Back:  symmetric, no curvature. ROM normal. No CVA tenderness. Lungs: clear to auscultation bilaterally Chest wall: no tenderness Heart: regular rate and rhythm, S1, S2 normal, no murmur, click, rub or gallop Abdomen:  Obese, protuberant, soft and nontender Extremities:  Trace ankle edema Pulses: 2+ and symmetric Skin:  Abrasion noted along the forehead.  He has scaly, greasy appearing rash within the beard and eyebrow region Lymph nodes: Cervical, supraclavicular, and axillary nodes normal. Neurologic: hard of hearing.  Otherwise cranial nerves II through XII grossly intact Psych: Mood stable, speech normal, affect appropriate.  Very pleasant and interactive  Flowsheet Row Clinical Support from 04/04/2022 in [place]  PHQ-2 Total Score 0      Assessment/ Plan: [name] here for annual physical exam.   Annual physical exam  Bilateral sensorineural hearing loss  Pre-diabetes - Plan: Bayer DCA Hb A1c Waived, CANCELED: Bayer DCA Hb A1c Waived  Mixed hyperlipidemia - Plan: Lipid Panel, CANCELED: Lipid Panel  History of stroke - Plan: Lipid Panel, CANCELED: Lipid Panel  Tinea barbae - Plan: ketoconazole (NIZORAL) 2 % cream  Dry skin  Urinary frequency - Plan: Ambulatory referral to Urology  Need for immunization against influenza - Plan: Flu Vaccine QUAD High Dose(Fluad)  ROI for colonoscopy results completed today.  Flu vaccination administered  Continues to struggle with hearing loss but has an over-the-counter product that he uses to assist.  Future orders for lipid, A1c ordered.  Not yet due for PSA, CBC etc.  Suspect he has tinea [place]bay along his face.  Ketoconazole cream to affected areas daily for the next 2 to 4 weeks.  If no resolution will consider oral antifungals.  Disc asked the importance of using nonscented skin care products.  Handout was provided.  Moisturization reinforced  Referral to urology for urinary frequency.  Last PSA level  was normal and symptoms are refractory to increased doses of Flomax.  Counseled on healthy lifestyle choices, including diet (rich  in fruits, vegetables and lean meats and low in salt and simple carbohydrates) and exercise (at least 30 minutes of moderate physical activity daily).    Franklin Clapsaddle M. Lajuana Ripple, DO

## 2022-04-17 NOTE — Patient Instructions (Signed)
Basic Skin Care Your skin plays an important role in keeping the entire body healthy.  Below are some tips on how to try and maximize skin health from the outside in.  Bathe in mildly warm water every 1 to 3 days, followed by light drying and an application of a thick moisturizer cream or ointment, preferably one that comes in a tub. Fragrance free moisturizing bars or body washes are preferred such as Purpose, Cetaphil, Dove sensitive skin, Aveeno, [place] Baby or Vanicream products. Use a fragrance free cream or ointment, not a lotion, such as plain petroleum jelly or Vaseline ointment, Aquaphor, Vanicream, Eucerin cream or a generic version, CeraVe Cream, Cetaphil Restoraderm, Aveeno Eczema Therapy and [place] Baby Calming, among others. Children with very dry skin often need to put on these creams two, three or four times a day.  As much as possible, use these creams enough to keep the skin from looking dry. Consider using fragrance free/dye free detergent, such as Arm and Hammer for sensitive skin, Tide Free or All Free.   If I am prescribing a medication to go on the skin, the medicine goes on first to the areas that need it, followed by a thick cream as above to the entire body.  Sun is a major cause of damage to the skin. I recommend sun protection for all of my patients. I prefer physical barriers such as hats with wide brims that cover the ears, long sleeve clothing with SPF protection including rash guards for swimming. These can be found seasonally at outdoor clothing companies, [employer] and [employer] and online at www.coolibar.com, www.uvskinz.com and www.sunprecautions.com. Avoid peak sun between the hours of 10am to 3pm to minimize sun exposure.  I recommend sunscreen for all of my patients older than 6 months of age when in the sun, preferably with broad spectrum coverage and SPF 30 or higher.  For children, I recommend sunscreens that only contain titanium dioxide and/or zinc oxide  in the active ingredients. These do not burn the eyes and appear to be safer than chemical sunscreens. These sunscreens include zinc oxide paste found in the diaper section, Vanicream Broad Spectrum 50+, Aveeno Natural Mineral Protection, Neutrogena Pure and Free Baby, Johnson and Johnson Baby Daily face and body lotion, [place] Baby products, among others. There is no such thing as waterproof sunscreen. All sunscreens should be reapplied after 60-80 minutes of wear.  Spray on sunscreens often use chemical sunscreens which do protect against the sun. However, these can be difficult to apply correctly, especially if wind is present, and can be more likely to irritate the skin.  Long term effects of chemical sunscreens are also not fully known.     

## 2022-04-18 ENCOUNTER — Other Ambulatory Visit: Payer: Medicare HMO

## 2022-04-18 DIAGNOSIS — Z8673 Personal history of transient ischemic attack (TIA), and cerebral infarction without residual deficits: Secondary | ICD-10-CM | POA: Diagnosis not present

## 2022-04-18 DIAGNOSIS — R7303 Prediabetes: Secondary | ICD-10-CM

## 2022-04-18 DIAGNOSIS — E782 Mixed hyperlipidemia: Secondary | ICD-10-CM | POA: Diagnosis not present

## 2022-04-18 LAB — BAYER DCA HB A1C WAIVED: HB A1C (BAYER DCA - WAIVED): 6.4 % — ABNORMAL HIGH (ref 4.8–5.6)

## 2022-04-19 LAB — LIPID PANEL
Chol/HDL Ratio: 3.2 ratio (ref 0.0–5.0)
Cholesterol, Total: 129 mg/dL (ref 100–199)
HDL: 40 mg/dL (ref >39)
LDL Chol Calc (NIH): 73 mg/dL (ref 0–99)
Triglycerides: 81 mg/dL (ref 0–149)
VLDL Cholesterol Cal: 16 mg/dL (ref 5–40)

## 2022-04-20 ENCOUNTER — Telehealth: Payer: Self-pay | Admitting: Family Medicine

## 2022-04-20 NOTE — Telephone Encounter (Signed)
Lmtcb.

## 2022-04-20 NOTE — Telephone Encounter (Signed)
Patient would like to talk to someone about A1C levels. Please call back

## 2022-04-20 NOTE — Telephone Encounter (Signed)
Pt aware a1c was a 6.4

## 2022-04-27 ENCOUNTER — Telehealth: Payer: Self-pay | Admitting: Family Medicine

## 2022-04-28 ENCOUNTER — Other Ambulatory Visit (HOSPITAL_COMMUNITY): Payer: Self-pay

## 2022-05-01 ENCOUNTER — Other Ambulatory Visit: Payer: Self-pay | Admitting: Family Medicine

## 2022-05-01 ENCOUNTER — Other Ambulatory Visit (HOSPITAL_COMMUNITY): Payer: Self-pay

## 2022-05-01 ENCOUNTER — Other Ambulatory Visit: Payer: Self-pay | Admitting: Student

## 2022-05-01 ENCOUNTER — Ambulatory Visit: Payer: Medicare HMO | Admitting: Family Medicine

## 2022-05-01 DIAGNOSIS — E782 Mixed hyperlipidemia: Secondary | ICD-10-CM

## 2022-05-01 MED ORDER — ATORVASTATIN CALCIUM 80 MG PO TABS
ORAL_TABLET | Freq: Every day | ORAL | 0 refills | Status: DC
  Filled 2022-05-01: qty 90, 90d supply, fill #0

## 2022-05-01 MED ORDER — LOSARTAN POTASSIUM 50 MG PO TABS
50.0000 mg | ORAL_TABLET | Freq: Every day | ORAL | 3 refills | Status: DC
  Filled 2022-05-01 – 2022-05-16 (×2): qty 90, 90d supply, fill #0
  Filled 2022-06-11 – 2022-08-06 (×2): qty 90, 90d supply, fill #1
  Filled 2022-10-29: qty 90, 90d supply, fill #2
  Filled 2023-02-04: qty 90, 90d supply, fill #3

## 2022-05-02 ENCOUNTER — Other Ambulatory Visit (HOSPITAL_COMMUNITY): Payer: Self-pay

## 2022-05-15 ENCOUNTER — Ambulatory Visit (INDEPENDENT_AMBULATORY_CARE_PROVIDER_SITE_OTHER): Payer: Medicare HMO | Admitting: Family Medicine

## 2022-05-15 ENCOUNTER — Encounter: Payer: Self-pay | Admitting: Family Medicine

## 2022-05-15 VITALS — BP 130/75 | HR 87 | Temp 97.9°F | Ht 70.0 in | Wt 252.6 lb

## 2022-05-15 DIAGNOSIS — B350 Tinea barbae and tinea capitis: Secondary | ICD-10-CM | POA: Diagnosis not present

## 2022-05-15 NOTE — Progress Notes (Signed)
Subjective: CC: Follow-up tinea barbae PCP: [name], DO HEN:IDPOEU Lawrence Bennett is a 66 y.o. male presenting to clinic today for:  1. Tinea  Started on topical ketoconazole 4 weeks ago.  He has completed treatment and still has quite a bit left over.  He reports resolution of the rash.   ROS: Per HPI  No Known Allergies Past Medical History:  Diagnosis Date   AAA (abdominal aortic aneurysm) (HCC)    Anxiety    Atherosclerosis of aorta (HCC)    Atrial contractions, premature    Depression    Dyslipidemia    Dyspnea    Elevated glucose 11/11/2015   Hyperlipidemia    Mild hypertension    Stroke [facility])    Urinary hesitancy     Current Outpatient Medications:    amLODipine (NORVASC) 10 MG tablet, Take 1 tablet (10 mg total) by mouth daily., Disp: 90 tablet, Rfl: 0   aspirin EC 81 MG tablet, Take 81 mg by mouth daily. Swallow whole., Disp: , Rfl:    atorvastatin (LIPITOR) 80 MG tablet, TAKE 1 TABLET (80 MG TOTAL) BY MOUTH DAILY., Disp: 90 tablet, Rfl: 0   clopidogrel (PLAVIX) 75 MG tablet, Take 1 tablet by mouth once daily., Disp: 30 tablet, Rfl: 11   divalproex (DEPAKOTE ER) 500 MG 24 hr tablet, Take 1 tablet (500 mg total) by mouth at bedtime., Disp: 90 tablet, Rfl: 2   ketoconazole (NIZORAL) 2 % cream, Apply 1 application topically daily for 28 days., Disp: 60 g, Rfl: 0   LORazepam (ATIVAN) 0.5 MG tablet, Take 1 tablet (0.5 mg total) by mouth 3 (three) times daily., Disp: 90 tablet, Rfl: 2   losartan (COZAAR) 50 MG tablet, Take 1 tablet (50 mg total) by mouth daily., Disp: 90 tablet, Rfl: 3   PARoxetine (PAXIL) 20 MG tablet, Take 1 tablet (20 mg total) by mouth at bedtime., Disp: 90 tablet, Rfl: 2   QUEtiapine (SEROQUEL) 50 MG tablet, Take 1 tablet (50 mg total) by mouth at bedtime., Disp: 90 tablet, Rfl: 2   tamsulosin (FLOMAX) 0.4 MG CAPS capsule, TAKE 1 CAPSULE BY MOUTH IN THE MORNING AND AT BEDTIME, Disp: 180 capsule, Rfl: 0   traZODone (DESYREL) 50 MG tablet,  Take 1 tablet (50 mg total) by mouth at bedtime., Disp: 90 tablet, Rfl: 2   venlafaxine XR (EFFEXOR-XR) 75 MG 24 hr capsule, Take 1 capsule (75 mg total) by mouth daily with breakfast., Disp: 90 capsule, Rfl: 2   vitamin C (ASCORBIC ACID) 500 MG tablet, Take 500 mg by mouth daily., Disp: , Rfl:    zinc gluconate 50 MG tablet, Take 50 mg by mouth daily., Disp: , Rfl:  Social History   Socioeconomic History   Marital status: Married    Spouse name: Not on file   Number of children: 3   Years of education: Not on file   Highest education level: Not on file  Occupational History   Occupation: retired  Tobacco Use   Smoking status: Former    Packs/day: 0.20    Years: 19.00    Total pack years: 3.80    Types: Cigarettes    Quit date: 2015    Years since quitting: 8.8   Smokeless tobacco: Never  Vaping Use   Vaping Use: Never used  Substance and Sexual Activity   Alcohol use: No    Alcohol/week: 0.0 standard drinks of alcohol   Drug use: No   Sexual activity: Not Currently  Other Topics Concern  Not on file  Social History Narrative   Right handed   Lives with wife   Social Determinants of Health   Financial Resource Strain: Low Risk  (04/03/2021)   Overall Financial Resource Strain (CARDIA)    Difficulty of Paying Living Expenses: Not hard at all  Food Insecurity: No Food Insecurity (04/03/2021)   Hunger Vital Sign    Worried About Running Out of Food in the Last Year: Never true    Ran Out of Food in the Last Year: Never true  Transportation Needs: No Transportation Needs (04/03/2021)   PRAPARE - Hydrologist (Medical): No    Lack of Transportation (Non-Medical): No  Physical Activity: Inactive (04/03/2021)   Exercise Vital Sign    Days of Exercise per Week: 0 days    Minutes of Exercise per Session: 0 min  Stress: No Stress Concern Present (04/03/2021)   [place]    Feeling  of Stress : Only a little  Social Connections: Socially Integrated (04/03/2021)   Social Connection and Isolation Panel [NHANES]    Frequency of Communication with Friends and Family: More than three times a week    Frequency of Social Gatherings with Friends and Family: More than three times a week    Attends Religious Services: 1 to 4 times per year    Active Member of [employer] or Organizations: Yes    Attends Archivist Meetings: 1 to 4 times per year    Marital Status: Married  Human resources officer Violence: Not At Risk (04/03/2021)   Humiliation, Afraid, Rape, and Kick questionnaire    Fear of Current or Ex-Partner: No    Emotionally Abused: No    Physically Abused: No    Sexually Abused: No   Family History  Problem Relation Age of Onset   COPD Mother    Stroke Father    Bipolar disorder Father    Hypertension Sister    Depression Sister    Colon cancer Neg Hx    Esophageal cancer Neg Hx    Inflammatory bowel disease Neg Hx    Liver disease Neg Hx    Rectal cancer Neg Hx    Pancreatic cancer Neg Hx    Stomach cancer Neg Hx     Objective: Office vital signs reviewed. BP 130/75   Pulse 87   Temp 97.9 F (36.6 C)   Ht '5\' 10"'$  (1.778 m)   Wt 252 lb 9.6 oz (114.6 kg)   SpO2 92%   BMI 36.24 kg/m   Physical Examination:  General: Awake, alert, well nourished, No acute distress Skin: Erythema resolved.  Has a very small dry patch of scaly skin at the corner of the right mouth near the mustache.  Assessment/ Plan: 66 y.o. male   Tinea barbae Resolved with topical.  No need for orals.  Discussed that this could recur and if so could reuse the cream that he has leftover for up to 4 weeks.  If refractory he will return to me for oral treatment  No orders of the defined types were placed in this encounter.  No orders of the defined types were placed in this encounter.    [name], DO [place] [phone]

## 2022-05-16 ENCOUNTER — Other Ambulatory Visit (HOSPITAL_COMMUNITY): Payer: Self-pay

## 2022-06-11 ENCOUNTER — Other Ambulatory Visit (HOSPITAL_COMMUNITY): Payer: Self-pay

## 2022-06-11 ENCOUNTER — Other Ambulatory Visit: Payer: Self-pay | Admitting: Family Medicine

## 2022-06-11 DIAGNOSIS — E782 Mixed hyperlipidemia: Secondary | ICD-10-CM

## 2022-06-11 MED ORDER — ATORVASTATIN CALCIUM 80 MG PO TABS
80.0000 mg | ORAL_TABLET | Freq: Every day | ORAL | 0 refills | Status: DC
  Filled 2022-06-11: qty 90, fill #0
  Filled 2022-08-06: qty 90, 90d supply, fill #0

## 2022-06-12 ENCOUNTER — Ambulatory Visit: Payer: Medicare HMO | Admitting: Neurology

## 2022-06-12 ENCOUNTER — Encounter: Payer: Self-pay | Admitting: Neurology

## 2022-06-12 VITALS — BP 122/81 | HR 92 | Ht 70.0 in | Wt 256.2 lb

## 2022-06-12 DIAGNOSIS — R2681 Unsteadiness on feet: Secondary | ICD-10-CM | POA: Diagnosis not present

## 2022-06-12 DIAGNOSIS — Z8673 Personal history of transient ischemic attack (TIA), and cerebral infarction without residual deficits: Secondary | ICD-10-CM

## 2022-06-12 NOTE — Progress Notes (Unsigned)
NEUROLOGY FOLLOW UP OFFICE NOTE  DRACEN REIGLE [id number] 1956/06/20  HISTORY OF PRESENT ILLNESS: I had the pleasure of seeing Lawrence Bennett in follow-up in the neurology clinic on 06/12/2022.  The patient was last seen 8 months ago for stroke. He is again accompanied by his wife who helps supplement the history today.  Records and images were personally reviewed where available.  He presented in 03/2021 after he had a brain MRI showing a very small stroke in the left lentiform nucleus.There were chronic lacunar infarcts in the right thalamus, multiple small vessel infarcts in the left corona radiata and left basal ganglia. There was moderate chronic microvascular disease and diffuse atrophy. MRI had been ordered by his PCP for balance issues, we had discussed this small stroke would not cause balance issues. He underwent Balance therapy and notes that it did help, but balance is still off a little bit. He loses his balance sometimes and has to hold on but no significant falls. MRA showed intracranial atherosclerosis. He is on aspirin and Plavix for secondary stroke prevention. He has seen Cardiology and decided to hold off on loop recorder for now. He denies any headaches, dizziness, vision changes, speech changes, focal numbness/tingling/weakness. His back pain has resolved.    History on Initial Assessment 03/21/2021: This is a pleasant 66 year old right-handed man with a history of hypertension, hyperlipidemia, prior lacunar stroke with no residual deficits, hearing loss, anxiety, depression, presenting for evaluation of stroke. He was seen by his PCP on 03/10/21 for hypertension with BPs in the 180s/115. He had been having worsening balance for several weeks and family was concerned about stroke. He denied any focal weakness, sensory changes. I personally reviewed MRI brain without contrast done 03/17/21 which showed a tiny 68m focus of restricted diffusion in the left lentiform nucleus consistent  with an acute/early subacute lacunar infarct. There were chronic lacunar infarcts in the right thalamus, multiple small vessel infarcts in the left corona radiata and left basal ganglia. There was moderate chronic microvascular disease and diffuse atrophy. He has been taking a daily aspirin and statin, amlodipine dose increased. BP today 116/76.  He and his wife report that the balance changes just came on quick, he did not use to stumble all over the place or walk slow. He buys and sells products on [employer] and fell down stairs when carrying an airconditioner 3 months ago. He feels balance changes started around 2 months ago. He denies any focal numbness/tingling/weakness. No speech or swallowing difficulties. He has rare headaches, no dizziness, diplopia, neck pain. He has back pain and urinary urgency. He gets out of breath walking short distances, his wife reports he does not exercise. He has been referred to Cardiology as well.  He had an MRI brain in 2015 because while he was at his doctor's office, he "felt someone is missing something," then his MRI showed chronic lacunar infarcts (no acute changes) and he was told he had a stroke in the past which he was unaware of. He reports his memory is "not that great," but he denies missing medications. He has been taking his aspirin daily. He denies missing bill payments or getting lost driving, but he would not remember where he went the day prior. He reports a history of mental breakdown in 2015 where he had 6 ECT treatments. Mood currently is good. He is sleeping well. There is a history of stroke in his father and paternal aunt.    Laboratory Data: Lab  Results  Component Value Date   HGBA1C 5.9 (H) 03/10/2021   Lab Results  Component Value Date   CHOL 156 10/07/2020   HDL 45 10/07/2020   LDLCALC 92 10/07/2020   TRIG 103 10/07/2020   CHOLHDL 3.5 10/07/2020   Lab Results  Component Value Date   TSH 4.420 03/10/2021    Lab  Results  Component Value Date   VITAMINB12 446 03/10/2021    PAST MEDICAL HISTORY: Past Medical History:  Diagnosis Date   AAA (abdominal aortic aneurysm) (HCC)    Anxiety    Atherosclerosis of aorta (HCC)    Atrial contractions, premature    Depression    Dyslipidemia    Dyspnea    Elevated glucose 11/11/2015   Hyperlipidemia    Mild hypertension    Stroke [facility])    Urinary hesitancy     MEDICATIONS: Current Outpatient Medications on File Prior to Visit  Medication Sig Dispense Refill   amLODipine (NORVASC) 10 MG tablet Take 1 tablet (10 mg total) by mouth daily. 90 tablet 0   aspirin EC 81 MG tablet Take 81 mg by mouth daily. Swallow whole.     atorvastatin (LIPITOR) 80 MG tablet TAKE 1 TABLET (80 MG TOTAL) BY MOUTH DAILY. 90 tablet 0   clopidogrel (PLAVIX) 75 MG tablet Take 1 tablet by mouth once daily. 30 tablet 11   divalproex (DEPAKOTE ER) 500 MG 24 hr tablet Take 1 tablet (500 mg total) by mouth at bedtime. 90 tablet 2   LORazepam (ATIVAN) 0.5 MG tablet Take 1 tablet (0.5 mg total) by mouth 3 (three) times daily. 90 tablet 2   losartan (COZAAR) 50 MG tablet Take 1 tablet (50 mg total) by mouth daily. 90 tablet 3   PARoxetine (PAXIL) 20 MG tablet Take 1 tablet (20 mg total) by mouth at bedtime. 90 tablet 2   QUEtiapine (SEROQUEL) 50 MG tablet Take 1 tablet (50 mg total) by mouth at bedtime. 90 tablet 2   tamsulosin (FLOMAX) 0.4 MG CAPS capsule TAKE 1 CAPSULE BY MOUTH IN THE MORNING AND AT BEDTIME (Patient taking differently: daily.) 180 capsule 0   traZODone (DESYREL) 50 MG tablet Take 1 tablet (50 mg total) by mouth at bedtime. 90 tablet 2   venlafaxine XR (EFFEXOR-XR) 75 MG 24 hr capsule Take 1 capsule (75 mg total) by mouth daily with breakfast. 90 capsule 2   vitamin C (ASCORBIC ACID) 500 MG tablet Take 500 mg by mouth daily.     zinc gluconate 50 MG tablet Take 50 mg by mouth daily.     No current facility-administered medications on file prior to visit.     ALLERGIES: No Known Allergies  FAMILY HISTORY: Family History  Problem Relation Age of Onset   COPD Mother    Stroke Father    Bipolar disorder Father    Hypertension Sister    Depression Sister    Colon cancer Neg Hx    Esophageal cancer Neg Hx    Inflammatory bowel disease Neg Hx    Liver disease Neg Hx    Rectal cancer Neg Hx    Pancreatic cancer Neg Hx    Stomach cancer Neg Hx     SOCIAL HISTORY: Social History   Socioeconomic History   Marital status: Married    Spouse name: Not on file   Number of children: 3   Years of education: Not on file   Highest education level: Not on file  Occupational History   Occupation: retired  Tobacco Use   Smoking status: Former    Packs/day: 0.20    Years: 19.00    Total pack years: 3.80    Types: Cigarettes    Quit date: 2015    Years since quitting: 8.9   Smokeless tobacco: Never  Vaping Use   Vaping Use: Never used  Substance and Sexual Activity   Alcohol use: No    Alcohol/week: 0.0 standard drinks of alcohol   Drug use: No   Sexual activity: Not Currently  Other Topics Concern   Not on file  Social History Narrative   Right handed   Lives with wife   Social Determinants of Health   Financial Resource Strain: Low Risk  (04/03/2021)   Overall Financial Resource Strain (CARDIA)    Difficulty of Paying Living Expenses: Not hard at all  Food Insecurity: No Food Insecurity (04/03/2021)   Hunger Vital Sign    Worried About Running Out of Food in the Last Year: Never true    Ran Out of Food in the Last Year: Never true  Transportation Needs: No Transportation Needs (04/03/2021)   PRAPARE - Hydrologist (Medical): No    Lack of Transportation (Non-Medical): No  Physical Activity: Inactive (04/03/2021)   Exercise Vital Sign    Days of Exercise per Week: 0 days    Minutes of Exercise per Session: 0 min  Stress: No Stress Concern Present (04/03/2021)   [place]    Feeling of Stress : Only a little  Social Connections: Socially Integrated (04/03/2021)   Social Connection and Isolation Panel [NHANES]    Frequency of Communication with Friends and Family: More than three times a week    Frequency of Social Gatherings with Friends and Family: More than three times a week    Attends Religious Services: 1 to 4 times per year    Active Member of [employer] or Organizations: Yes    Attends Archivist Meetings: 1 to 4 times per year    Marital Status: Married  Human resources officer Violence: Not At Risk (04/03/2021)   Humiliation, Afraid, Rape, and Kick questionnaire    Fear of Current or Ex-Partner: No    Emotionally Abused: No    Physically Abused: No    Sexually Abused: No     PHYSICAL EXAM: Vitals:   06/12/22 1420  BP: 122/81  Pulse: 92  SpO2: 95%   General: No acute distress Head:  Normocephalic/atraumatic Skin/Extremities: No rash, no edema Neurological Exam: alert and awake. No aphasia or dysarthria. Fund of knowledge is appropriate. Attention and concentration are normal.   Cranial nerves: Pupils equal, round. Extraocular movements intact with no nystagmus. Visual fields full.  No facial asymmetry.  Motor: Bulk and tone normal, muscle strength 5/5 throughout with no pronator drift. Reflexes +1 throughout.  Finger to nose testing intact.  Gait slow and cautious, slightly wide-based, no ataxia with good arm swing. Negative Romberg test.   IMPRESSION: This is a pleasant 66 yo RH man with a history of hypertension, hyperlipidemia, prior lacunar stroke with no residual deficits, hearing loss, anxiety, depression, who had a punctate stroke in the left lentiform nucleus on MRI done 03/2021. MRI brain was ordered for balance changes, we had discussed the tiny stroke would not cause symptoms. We again discussed that balance issues are likely multifactorial from small vessel disease, back/knee pain (which has  improved with PT). He was encouraged to continue home exercise  program. Continue aspirin, Plavix, control of vascular risk factors for secondary stroke prevention. Continue follow-up with Cardiology. We discussed his neurological exam today is non-focal, he does not have any residual deficits from the very small stroke. Follow-up as needed, he knows to call for any changes.   Thank you for allowing me to participate in his care.  Please do not hesitate to call for any questions or concerns.    Ellouise Newer, M.D.   CC: Dr. Lajuana Ripple

## 2022-06-12 NOTE — Patient Instructions (Signed)
Good to see you doing well. Continue all your medications, continue control of blood pressure, cholesterol, sugar levels to help prevent another stroke. Continue working on doing home physical therapy exercises regularly. Follow-up as needed, call for any changes.

## 2022-06-13 ENCOUNTER — Other Ambulatory Visit (HOSPITAL_COMMUNITY): Payer: Self-pay

## 2022-07-03 ENCOUNTER — Other Ambulatory Visit: Payer: Self-pay

## 2022-07-03 ENCOUNTER — Other Ambulatory Visit (HOSPITAL_COMMUNITY): Payer: Self-pay

## 2022-07-03 ENCOUNTER — Other Ambulatory Visit: Payer: Self-pay | Admitting: Family Medicine

## 2022-07-03 DIAGNOSIS — I10 Essential (primary) hypertension: Secondary | ICD-10-CM

## 2022-07-03 MED ORDER — AMLODIPINE BESYLATE 10 MG PO TABS
10.0000 mg | ORAL_TABLET | Freq: Every day | ORAL | 0 refills | Status: DC
  Filled 2022-07-03: qty 90, 90d supply, fill #0

## 2022-07-05 ENCOUNTER — Other Ambulatory Visit (HOSPITAL_COMMUNITY): Payer: Self-pay | Admitting: Psychiatry

## 2022-07-05 ENCOUNTER — Other Ambulatory Visit (HOSPITAL_COMMUNITY): Payer: Self-pay

## 2022-07-08 MED ORDER — LORAZEPAM 0.5 MG PO TABS
0.5000 mg | ORAL_TABLET | Freq: Three times a day (TID) | ORAL | 2 refills | Status: DC
  Filled 2022-07-08 – 2022-07-11 (×2): qty 90, 30d supply, fill #0
  Filled 2022-08-06: qty 90, 30d supply, fill #1
  Filled 2022-09-06: qty 90, 30d supply, fill #2

## 2022-07-09 ENCOUNTER — Other Ambulatory Visit (HOSPITAL_COMMUNITY): Payer: Self-pay

## 2022-07-11 ENCOUNTER — Other Ambulatory Visit (HOSPITAL_COMMUNITY): Payer: Self-pay

## 2022-07-11 ENCOUNTER — Other Ambulatory Visit: Payer: Self-pay

## 2022-07-23 ENCOUNTER — Other Ambulatory Visit (HOSPITAL_COMMUNITY): Payer: Self-pay

## 2022-07-30 ENCOUNTER — Other Ambulatory Visit: Payer: Self-pay | Admitting: *Deleted

## 2022-07-30 DIAGNOSIS — Z9889 Other specified postprocedural states: Secondary | ICD-10-CM

## 2022-07-31 ENCOUNTER — Ambulatory Visit (INDEPENDENT_AMBULATORY_CARE_PROVIDER_SITE_OTHER): Payer: Medicare HMO | Admitting: Nurse Practitioner

## 2022-07-31 ENCOUNTER — Encounter: Payer: Self-pay | Admitting: Nurse Practitioner

## 2022-07-31 ENCOUNTER — Other Ambulatory Visit (HOSPITAL_COMMUNITY): Payer: Self-pay

## 2022-07-31 VITALS — BP 142/84 | HR 89 | Temp 97.9°F | Resp 20 | Ht 70.0 in | Wt 258.0 lb

## 2022-07-31 DIAGNOSIS — L03032 Cellulitis of left toe: Secondary | ICD-10-CM | POA: Diagnosis not present

## 2022-07-31 MED ORDER — SULFAMETHOXAZOLE-TRIMETHOPRIM 800-160 MG PO TABS
1.0000 | ORAL_TABLET | Freq: Two times a day (BID) | ORAL | 0 refills | Status: DC
  Filled 2022-07-31: qty 20, 10d supply, fill #0

## 2022-07-31 NOTE — Patient Instructions (Signed)
Paronychia Paronychia is an infection of the skin. It happens near a fingernail or toenail. It may cause pain and swelling around the nail. In some cases, a fluid-filled bump (abscess) can form near or under the nail. Often, this condition is not serious, and it clears up with treatment. What are the causes? This condition may be caused by a germ. The germ may be bacteria or a fungus. These germs can enter the body through an opening in the skin, such as a cut or a hangnail. Other causes include: Repeated injuries to your fingernails or toenails. Irritation of the base and sides of the nail (cuticle). What increases the risk? This condition is more likely to develop in people who: Get their hands wet often, such as a dishwasher. Bite their fingernails or the base and sides of their nails. Have other skin problems. Have hangnails or hurt fingertips. Come into contact with chemicals like detergents. Have diabetes. What are the signs or symptoms? Redness and swelling of the skin near the nail. A tender feeling around the nail. Pus-filled bumps under the skin at the base and sides of the nail. Fluid or pus under the nail. Pain in the area. How is this treated? Treatment depends on the cause of your condition and how bad it is. If your condition is mild, it may clear up on its own in a few days or after soaking in warm water. If needed, treatment may include: Antibiotic medicine. Antifungal medicine. A procedure to drain pus from a fluid-filled bump. Medicine to treat irritation and swelling (corticosteroids). Taking off part of an ingrown toenail. A bandage (dressing) may be placed over the nail area. Follow these instructions at home: Wound care Keep the affected area clean. Soak the fingers or toes in warm water as told by your doctor. You may be told to do this for 20 minutes, 2-3 times a day. Keep the area dry when you are not soaking it. Do not try to drain a fluid-filled bump on  your own. Follow instructions from your doctor about how to take care of the affected area. Make sure you: Wash your hands with soap and water for at least 20 seconds before and after you change your bandage. If you cannot use soap and water, use hand sanitizer. Change your bandage as told by your doctor. If you had a fluid-filled bump and your doctor drained it, check the area every day for signs of infection. Check for: Redness, swelling, or pain. Fluid or blood. Warmth. Pus or a bad smell. Medicines  Take over-the-counter and prescription medicines only as told by your doctor. If you were prescribed an antibiotic medicine, take it as told by your doctor. Do not stop taking it even if you start to feel better. General instructions Avoid contact with anything that irritates your skin or that you are allergic to. Do not pick at the affected area. Keep all follow-up visits. Prevention To prevent this condition from happening again: Wear rubber gloves when putting your hands in water for washing dishes or other tasks. Wear gloves if your hands might touch cleaners or chemicals. Avoid injuring your nails or fingertips. Do not bite your nails or tear hangnails. Do not cut your nails very short. Do not cut the skin at the base and sides of the nail. Use clean nail clippers or scissors when trimming nails. Contact a doctor if: You feel worse. You do not get better. You keep having or you have more fluid, blood, or   pus coming from the affected area. Your affected finger, toe, or joint gets swollen or hard to move. You have a fever or chills. There is redness spreading from the affected area. Summary Paronychia is an infection of the skin. It happens near a fingernail or toenail. This condition may cause pain and swelling around the nail. Soak the fingers or toes in warm water as told by your doctor. Often, this condition is not serious, and it clears up with treatment. This information  is not intended to replace advice given to you by your health care provider. Make sure you discuss any questions you have with your health care provider. Document Revised: 09/19/2020 Document Reviewed: 09/19/2020 Elsevier Patient Education  2023 [employer] Inc[employer]  

## 2022-07-31 NOTE — Progress Notes (Signed)
   Subjective:    Patient ID: Lawrence Bennett, male    DOB: [birth year], 67 y.o.   MRN: [id number]   Chief Complaint: Big toe left foot red and sore   HPI Patient said he had a hang nail on left great toe several days ago. He picked at it and now it is red and sore to the touch. He has been doing warm epsom salt soaks at home.     Review of Systems  Constitutional:  Negative for diaphoresis.  Eyes:  Negative for pain.  Respiratory:  Negative for shortness of breath.   Cardiovascular:  Negative for chest pain, palpitations and leg swelling.  Gastrointestinal:  Negative for abdominal pain.  Endocrine: Negative for polydipsia.  Skin:  Negative for rash.  Neurological:  Negative for dizziness, weakness and headaches.  Hematological:  Does not bruise/bleed easily.  All other systems reviewed and are negative.      Objective:   Physical Exam Constitutional:      Appearance: Normal appearance. He is obese.  Cardiovascular:     Rate and Rhythm: Normal rate and regular rhythm.     Heart sounds: Normal heart sounds.  Pulmonary:     Effort: Pulmonary effort is normal.     Breath sounds: Normal breath sounds.  Skin:    General: Skin is warm.     Comments: Erythematous left great nail bed with yellowish excudate  Neurological:     General: No focal deficit present.     Mental Status: He is alert and oriented to person, place, and time.  Psychiatric:        Mood and Affect: Mood normal.        Behavior: Behavior normal.      BP (!) 142/84   Pulse 89   Temp 97.9 F (36.6 C) (Temporal)   Resp 20   Ht '5\' 10"'$  (1.778 m)   Wt 258 lb (117 kg)   SpO2 94%   BMI 37.02 kg/m       Assessment & Plan:  Lawrence Bennett in today with chief complaint of Big toe left foot red and sore   1. Paronychia of great toe of left foot Continue epsom salt soaks Do not pick at area  Meds ordered this encounter  Medications   sulfamethoxazole-trimethoprim (BACTRIM DS) 800-160 MG tablet     Sig: Take 1 tablet by mouth 2 (two) times daily.    Dispense:  20 tablet    Refill:  0    Order Specific Question:   Supervising Provider    Answer:   Caryl Pina A [[id number]]       The above assessment and management plan was discussed with the patient. The patient verbalized understanding of and has agreed to the management plan. Patient is aware to call the clinic if symptoms persist or worsen. Patient is aware when to return to the clinic for a follow-up visit. Patient educated on when it is appropriate to go to the emergency department.   Mary-Margaret Hassell Done, FNP

## 2022-08-06 ENCOUNTER — Other Ambulatory Visit: Payer: Self-pay | Admitting: Family Medicine

## 2022-08-06 ENCOUNTER — Other Ambulatory Visit (HOSPITAL_COMMUNITY): Payer: Self-pay

## 2022-08-06 DIAGNOSIS — N401 Enlarged prostate with lower urinary tract symptoms: Secondary | ICD-10-CM

## 2022-08-06 MED ORDER — TAMSULOSIN HCL 0.4 MG PO CAPS
0.4000 mg | ORAL_CAPSULE | Freq: Two times a day (BID) | ORAL | 0 refills | Status: DC
  Filled 2022-08-06: qty 180, 90d supply, fill #0

## 2022-08-07 ENCOUNTER — Other Ambulatory Visit (HOSPITAL_COMMUNITY): Payer: Self-pay

## 2022-08-10 ENCOUNTER — Encounter: Payer: Self-pay | Admitting: Vascular Surgery

## 2022-08-10 ENCOUNTER — Ambulatory Visit (HOSPITAL_COMMUNITY)
Admission: RE | Admit: 2022-08-10 | Discharge: 2022-08-10 | Disposition: A | Discharge status: Home / Self Care | Payer: Medicare HMO | Source: Ambulatory Visit | Attending: Vascular Surgery | Admitting: Vascular Surgery

## 2022-08-10 ENCOUNTER — Ambulatory Visit: Payer: Medicare HMO | Admitting: Vascular Surgery

## 2022-08-10 ENCOUNTER — Ambulatory Visit (INDEPENDENT_AMBULATORY_CARE_PROVIDER_SITE_OTHER)
Admission: RE | Admit: 2022-08-10 | Discharge: 2022-08-10 | Disposition: A | Discharge status: Home / Self Care | Payer: Medicare HMO | Source: Ambulatory Visit | Attending: Vascular Surgery | Admitting: Vascular Surgery

## 2022-08-10 VITALS — BP 128/82 | HR 83 | Temp 97.3°F | Resp 16 | Ht 70.0 in | Wt 259.0 lb

## 2022-08-10 DIAGNOSIS — I739 Peripheral vascular disease, unspecified: Secondary | ICD-10-CM | POA: Diagnosis not present

## 2022-08-10 DIAGNOSIS — Z8679 Personal history of other diseases of the circulatory system: Secondary | ICD-10-CM

## 2022-08-10 DIAGNOSIS — Z9889 Other specified postprocedural states: Secondary | ICD-10-CM | POA: Diagnosis not present

## 2022-08-10 DIAGNOSIS — I714 Abdominal aortic aneurysm, without rupture, unspecified: Secondary | ICD-10-CM

## 2022-08-10 LAB — VAS US ABI WITH/WO TBI
Left ABI: 1.08
Right ABI: 1.1

## 2022-08-10 NOTE — Progress Notes (Signed)
Office Note     CC:  AAA Requesting Provider:  [name], DO  HPI: Lawrence Bennett is a 67 y.o. ([birth year]) male presenting in follow-up s/p 08/21/21 EVAR for 5.7cm abdominal aortic aneurysm.  On exam today, Lawrence Bennett was doing well. He has had no issues since last seen Lawrence Bennett back, abdominal, chest pain. No symptoms of claudication, no rest pain in the feet, no tissue loss.  The pt is  on a statin for cholesterol management.  The pt is not on a daily aspirin.   Other AC:  plavix The pt is on on medication for hypertension.   The pt is not diabetic.  Tobacco hx:  none  Past Medical History:  Diagnosis Date   AAA (abdominal aortic aneurysm) (HCC)    Anxiety    Atherosclerosis of aorta (HCC)    Atrial contractions, premature    Depression    Dyslipidemia    Dyspnea    Elevated glucose 11/11/2015   Hyperlipidemia    Mild hypertension    Stroke [facility])    Urinary hesitancy     Past Surgical History:  Procedure Laterality Date   ABDOMINAL AORTIC ENDOVASCULAR STENT GRAFT Bilateral 08/21/2021   Procedure: ENDOVASCULAR AORTIC STENT GRAFT REPAIR;  Surgeon: Broadus John, MD;  Location: [facility] OR;  Service: Vascular;  Laterality: Bilateral;   COLON RESECTION  2012   ULTRASOUND GUIDANCE FOR VASCULAR ACCESS Bilateral 08/21/2021   Procedure: ULTRASOUND GUIDANCE FOR VASCULAR ACCESS;  Surgeon: Broadus John, MD;  Location: [facility] OR;  Service: Vascular;  Laterality: Bilateral;    Social History   Socioeconomic History   Marital status: Married    Spouse name: Not on file   Number of children: 3   Years of education: Not on file   Highest education level: Not on file  Occupational History   Occupation: retired  Tobacco Use   Smoking status: Former    Packs/day: 0.20    Years: 19.00    Total pack years: 3.80    Types: Cigarettes    Quit date: 2015    Years since quitting: 9.1   Smokeless tobacco: Never  Vaping Use   Vaping Use: Never used  Substance and Sexual  Activity   Alcohol use: No    Alcohol/week: 0.0 standard drinks of alcohol   Drug use: No   Sexual activity: Not Currently  Other Topics Concern   Not on file  Social History Narrative   Right handed   Lives with wife   Social Determinants of Health   Financial Resource Strain: Low Risk  (04/03/2021)   Overall Financial Resource Strain (CARDIA)    Difficulty of Paying Living Expenses: Not hard at all  Food Insecurity: No Food Insecurity (04/03/2021)   Hunger Vital Sign    Worried About Running Out of Food in the Last Year: Never true    [place] in the Last Year: Never true  Transportation Needs: No Transportation Needs (04/03/2021)   PRAPARE - Hydrologist (Medical): No    Lack of Transportation (Non-Medical): No  Physical Activity: Inactive (04/03/2021)   Exercise Vital Sign    Days of Exercise per Week: 0 days    Minutes of Exercise per Session: 0 min  Stress: No Stress Concern Present (04/03/2021)   [place]    Feeling of Stress : Only a little  Social Connections: Socially Integrated (04/03/2021)   Social Connection and  Isolation Panel [NHANES]    Frequency of Communication with Friends and Family: More than three times a week    Frequency of Social Gatherings with Friends and Family: More than three times a week    Attends Religious Services: 1 to 4 times per year    Active Member of [employer] or Organizations: Yes    Attends Archivist Meetings: 1 to 4 times per year    Marital Status: Married  Human resources officer Violence: Not At Risk (04/03/2021)   Humiliation, Afraid, Rape, and Kick questionnaire    Fear of Current or Ex-Partner: No    Emotionally Abused: No    Physically Abused: No    Sexually Abused: No    Family History  Problem Relation Age of Onset   COPD Mother    Stroke Father    Bipolar disorder Father    Hypertension Sister    Depression Sister     Colon cancer Neg Hx    Esophageal cancer Neg Hx    Inflammatory bowel disease Neg Hx    Liver disease Neg Hx    Rectal cancer Neg Hx    Pancreatic cancer Neg Hx    Stomach cancer Neg Hx     Current Outpatient Medications  Medication Sig Dispense Refill   amLODipine (NORVASC) 10 MG tablet Take 1 tablet (10 mg total) by mouth daily. 90 tablet 0   aspirin EC 81 MG tablet Take 81 mg by mouth daily. Swallow whole.     atorvastatin (LIPITOR) 80 MG tablet Take 1 tablet (80 mg total) by mouth daily. 90 tablet 0   clopidogrel (PLAVIX) 75 MG tablet Take 1 tablet by mouth once daily. 30 tablet 11   divalproex (DEPAKOTE ER) 500 MG 24 hr tablet Take 1 tablet (500 mg total) by mouth at bedtime. 90 tablet 2   LORazepam (ATIVAN) 0.5 MG tablet Take 1 tablet (0.5 mg total) by mouth 3 (three) times daily. 90 tablet 2   losartan (COZAAR) 50 MG tablet Take 1 tablet (50 mg total) by mouth daily. 90 tablet 3   PARoxetine (PAXIL) 20 MG tablet Take 1 tablet (20 mg total) by mouth at bedtime. 90 tablet 2   QUEtiapine (SEROQUEL) 50 MG tablet Take 1 tablet (50 mg total) by mouth at bedtime. 90 tablet 2   tamsulosin (FLOMAX) 0.4 MG CAPS capsule Take 1 capsule (0.4 mg total) by mouth in the morning and at bedtime. 180 capsule 0   traZODone (DESYREL) 50 MG tablet Take 1 tablet (50 mg total) by mouth at bedtime. 90 tablet 2   venlafaxine XR (EFFEXOR-XR) 75 MG 24 hr capsule Take 1 capsule (75 mg total) by mouth daily with breakfast. 90 capsule 2   vitamin C (ASCORBIC ACID) 500 MG tablet Take 500 mg by mouth daily.     zinc gluconate 50 MG tablet Take 50 mg by mouth daily.     sulfamethoxazole-trimethoprim (BACTRIM DS) 800-160 MG tablet Take 1 tablet by mouth 2 (two) times daily. (Patient not taking: Reported on 08/10/2022) 20 tablet 0   No current facility-administered medications for this visit.    No Known Allergies   REVIEW OF SYSTEMS:   [X]$  denotes positive finding, [ ]$  denotes negative finding Cardiac   Comments:  Chest pain or chest pressure:    Shortness of breath upon exertion:    Short of breath when lying flat:    Irregular heart rhythm:        Vascular    Pain in  calf, thigh, or hip brought on by ambulation:    Pain in feet at night that wakes you up from your sleep:     Blood clot in your veins:    Leg swelling:         Pulmonary    Oxygen at home:    Productive cough:     Wheezing:         Neurologic    Sudden weakness in arms or legs:     Sudden numbness in arms or legs:     Sudden onset of difficulty speaking or slurred speech:    Temporary loss of vision in one eye:     Problems with dizziness:         Gastrointestinal    Blood in stool:     Vomited blood:         Genitourinary    Burning when urinating:     Blood in urine:        Psychiatric    Major depression:         Hematologic    Bleeding problems:    Problems with blood clotting too easily:        Skin    Rashes or ulcers:        Constitutional    Fever or chills:      PHYSICAL EXAMINATION:  Vitals:   08/10/22 1059  BP: 128/82  Pulse: 83  Resp: 16  Temp: (!) 97.3 F (36.3 C)  TempSrc: Temporal  SpO2: 91%  Weight: 259 lb (117.5 kg)  Height: 5' 10"$  (1.778 m)    General:  WDWN in NAD; vital signs documented above Gait: Not observed HENT: WNL, normocephalic Pulmonary: normal non-labored breathing , without wheezing Cardiac: regular HR,  Abdomen: soft, NT, no masses Skin: without rashes Vascular Exam/Pulses:  Right Left  Radial 2+ (normal) 2+ (normal)  Ulnar 2+ (normal) 2+ (normal)  Femoral 2+ (normal) 2+ (normal)  Popliteal trace trace  DP 2+ (normal) 2+ (normal)  PT 2+ (normal) 2+ (normal)   Extremities: without ischemic changes, without Gangrene , without cellulitis; without open wounds;  Musculoskeletal: no muscle wasting or atrophy  Neurologic: A&O X 3;  No focal weakness or paresthesias are detected Psychiatric:  The pt has Normal affect.   Non-Invasive Vascular  Imaging:    09/29/21: CT angio abdomen pelvis reviewed demonstrating infrarenal placement of endovascular aortic repair.  Left-sided accessory renal covered as an intraoperatively Disease at the ostia of bilateral renal arteries present preop.  Appropriate filling of the kidneys. Type II endoleak from lumbar, IMA. No significant size change from previous Some compression of the left limb appreciated at the terminal aorta  EVAR duplex ultrasound and straits no endoleak, regression in sac size, widely patent left EVAR limb, right leg unable to be evaluated due to the patient's habitus.   ASSESSMENT/PLAN: WARNE JENNETTE is a 67 y.o. male presenting s/p 08/21/21 EVAR for asymptomatic AAA.  Since last seen, Lawrence Bennett has been doing well.  Imaging was reviewed demonstrating regression in sac size, no endoleak, widely patent left EVAR limb.  The right limb was unable to be evaluated due to the patient's body habitus.  Plan is for Lawrence Bennett to have the right limb evaluated in the next month.  I will call Lawrence Bennett with these results.  Pending results, I plan to see Lawrence Bennett on a yearly basis with repeat EVAR duplex and ABI. Asked that he continue his current medication regimen.  Broadus John, MD [facility] [phone]

## 2022-08-15 ENCOUNTER — Other Ambulatory Visit: Payer: Self-pay

## 2022-08-15 DIAGNOSIS — I714 Abdominal aortic aneurysm, without rupture, unspecified: Secondary | ICD-10-CM

## 2022-08-16 ENCOUNTER — Encounter: Payer: Self-pay | Admitting: Podiatry

## 2022-08-16 ENCOUNTER — Other Ambulatory Visit (HOSPITAL_COMMUNITY): Payer: Self-pay

## 2022-08-16 ENCOUNTER — Ambulatory Visit (INDEPENDENT_AMBULATORY_CARE_PROVIDER_SITE_OTHER): Payer: Medicare HMO | Admitting: Podiatry

## 2022-08-16 DIAGNOSIS — L600 Ingrowing nail: Secondary | ICD-10-CM | POA: Diagnosis not present

## 2022-08-16 MED ORDER — NEOMYCIN-POLYMYXIN-HC 1 % OT SOLN
OTIC | 1 refills | Status: DC
  Filled 2022-08-16: qty 10, 30d supply, fill #0

## 2022-08-16 MED ORDER — AMOXICILLIN-POT CLAVULANATE 875-125 MG PO TABS
1.0000 | ORAL_TABLET | Freq: Two times a day (BID) | ORAL | 0 refills | Status: DC
  Filled 2022-08-16: qty 20, 10d supply, fill #0

## 2022-08-16 NOTE — Progress Notes (Signed)
Subjective:  Patient ID: MATEI COOMER, male    DOB: Nov 21, 1955,  MRN: [id number] HPI Chief Complaint  Patient presents with   Toe Pain    Hallux left - lateral border, red, swollen, tender, PCP evaluated-Rx'd Bactrim for 2 weeks, maybe a little better, but not completely well   New Patient (Initial Visit)    67 y.o. male presents with the above complaint.   ROS: Denies fever chills nausea vomit muscle aches pains calf pain back pain chest pain shortness of breath.  Past Medical History:  Diagnosis Date   AAA (abdominal aortic aneurysm) (HCC)    Anxiety    Atherosclerosis of aorta (HCC)    Atrial contractions, premature    Depression    Dyslipidemia    Dyspnea    Elevated glucose 11/11/2015   Hyperlipidemia    Mild hypertension    Stroke [facility])    Urinary hesitancy    Past Surgical History:  Procedure Laterality Date   ABDOMINAL AORTIC ENDOVASCULAR STENT GRAFT Bilateral 08/21/2021   Procedure: ENDOVASCULAR AORTIC STENT GRAFT REPAIR;  Surgeon: Broadus John, MD;  Location: [place];  Service: Vascular;  Laterality: Bilateral;   COLON RESECTION  2012   ULTRASOUND GUIDANCE FOR VASCULAR ACCESS Bilateral 08/21/2021   Procedure: ULTRASOUND GUIDANCE FOR VASCULAR ACCESS;  Surgeon: Broadus John, MD;  Location: [facility] OR;  Service: Vascular;  Laterality: Bilateral;    Current Outpatient Medications:    amoxicillin-clavulanate (AUGMENTIN) 875-125 MG tablet, Take 1 tablet by mouth 2 (two) times daily., Disp: 20 tablet, Rfl: 0   NEOMYCIN-POLYMYXIN-HYDROCORTISONE (CORTISPORIN) 1 % SOLN OTIC solution, Apply 1-2 drops to toe 2 times a day after soaking, Disp: 10 mL, Rfl: 1   amLODipine (NORVASC) 10 MG tablet, Take 1 tablet (10 mg total) by mouth daily., Disp: 90 tablet, Rfl: 0   aspirin EC 81 MG tablet, Take 81 mg by mouth daily. Swallow whole., Disp: , Rfl:    atorvastatin (LIPITOR) 80 MG tablet, Take 1 tablet (80 mg total) by mouth daily., Disp: 90 tablet, Rfl: 0   clopidogrel (PLAVIX)  75 MG tablet, Take 1 tablet by mouth once daily., Disp: 30 tablet, Rfl: 11   divalproex (DEPAKOTE ER) 500 MG 24 hr tablet, Take 1 tablet (500 mg total) by mouth at bedtime., Disp: 90 tablet, Rfl: 2   LORazepam (ATIVAN) 0.5 MG tablet, Take 1 tablet (0.5 mg total) by mouth 3 (three) times daily., Disp: 90 tablet, Rfl: 2   losartan (COZAAR) 50 MG tablet, Take 1 tablet (50 mg total) by mouth daily., Disp: 90 tablet, Rfl: 3   PARoxetine (PAXIL) 20 MG tablet, Take 1 tablet (20 mg total) by mouth at bedtime., Disp: 90 tablet, Rfl: 2   QUEtiapine (SEROQUEL) 50 MG tablet, Take 1 tablet (50 mg total) by mouth at bedtime., Disp: 90 tablet, Rfl: 2   tamsulosin (FLOMAX) 0.4 MG CAPS capsule, Take 1 capsule (0.4 mg total) by mouth in the morning and at bedtime., Disp: 180 capsule, Rfl: 0   traZODone (DESYREL) 50 MG tablet, Take 1 tablet (50 mg total) by mouth at bedtime., Disp: 90 tablet, Rfl: 2   venlafaxine XR (EFFEXOR-XR) 75 MG 24 hr capsule, Take 1 capsule (75 mg total) by mouth daily with breakfast., Disp: 90 capsule, Rfl: 2   vitamin C (ASCORBIC ACID) 500 MG tablet, Take 500 mg by mouth daily., Disp: , Rfl:    zinc gluconate 50 MG tablet, Take 50 mg by mouth daily., Disp: , Rfl:   No Known  Allergies Review of Systems Objective:  There were no vitals filed for this visit.  General: Well developed, nourished, in no acute distress, alert and oriented x3   Dermatological: Skin is warm, dry and supple bilateral. Nails x 10 are well maintained; remaining integument appears unremarkable at this time. There are no open sores, no preulcerative lesions, no rash or signs of infection present.  Sharp incurvated nail margin tibial-fibular border the hallux left.  Erythema extending to the level of the interphalangeal joint and just proximal to that.  Vascular: Dorsalis Pedis artery and Posterior Tibial artery pedal pulses are 2/4 bilateral with immedate capillary fill time. Pedal hair growth present. No varicosities  and no lower extremity edema present bilateral.   Neruologic: Grossly intact via light touch bilateral. Vibratory intact via tuning fork bilateral. Protective threshold with Semmes Wienstein monofilament intact to all pedal sites bilateral. Patellar and Achilles deep tendon reflexes 2+ bilateral. No Babinski or clonus noted bilateral.   Musculoskeletal: No gross boney pedal deformities bilateral. No pain, crepitus, or limitation noted with foot and ankle range of motion bilateral. Muscular strength 5/5 in all groups tested bilateral.  Gait: Unassisted, Nonantalgic.    Radiographs:  None taken  Assessment & Plan:   Assessment: Ingrown toenail cellulitis tibial and fibular border hallux left  Plan: Performed a chemical matricectomy today after local anesthetic was administered he tolerated procedure well without complications.  Was given both oral written with instructions for care and soaking of the toe as well as a prescription for Cortisporin Otic to be applied twice daily after soaking.  He was also provided a prescription for Augmentin oral I will follow-up with him in 2 weeks.     Gedalya Jim T. [place], [place]

## 2022-08-16 NOTE — Patient Instructions (Signed)

## 2022-08-25 ENCOUNTER — Other Ambulatory Visit (HOSPITAL_COMMUNITY): Payer: Self-pay

## 2022-08-27 ENCOUNTER — Other Ambulatory Visit (HOSPITAL_COMMUNITY): Payer: Self-pay

## 2022-08-28 ENCOUNTER — Encounter: Payer: Self-pay | Admitting: Podiatry

## 2022-08-28 ENCOUNTER — Ambulatory Visit: Payer: Medicare HMO | Admitting: Podiatry

## 2022-08-28 DIAGNOSIS — L600 Ingrowing nail: Secondary | ICD-10-CM

## 2022-08-28 DIAGNOSIS — Z9889 Other specified postprocedural states: Secondary | ICD-10-CM

## 2022-08-28 NOTE — Progress Notes (Signed)
He presents today for nail check states that seems to be doing fine does not bother me at all lot better than it was prior to the procedure.  Continues to soak Epsom salts warm water.  Objective: Vital signs are stable alert oriented x 3.  Pulses are palpable.  There is no erythema edema cellulitis drainage or odor.  Appears to be healing very nicely.  Assessment: Well-healing surgical toe.  Plan: Follow-up with [place] on an as-needed basis.

## 2022-09-06 ENCOUNTER — Other Ambulatory Visit (HOSPITAL_COMMUNITY): Payer: Self-pay

## 2022-09-13 ENCOUNTER — Other Ambulatory Visit: Payer: Self-pay | Admitting: Family Medicine

## 2022-09-13 DIAGNOSIS — I10 Essential (primary) hypertension: Secondary | ICD-10-CM

## 2022-09-14 ENCOUNTER — Other Ambulatory Visit: Payer: Self-pay | Admitting: Vascular Surgery

## 2022-09-14 ENCOUNTER — Other Ambulatory Visit (HOSPITAL_COMMUNITY): Payer: Self-pay

## 2022-09-14 ENCOUNTER — Ambulatory Visit (HOSPITAL_COMMUNITY)
Admission: RE | Admit: 2022-09-14 | Discharge: 2022-09-14 | Disposition: A | Discharge status: Home / Self Care | Payer: Medicare HMO | Source: Ambulatory Visit | Attending: Vascular Surgery | Admitting: Vascular Surgery

## 2022-09-14 ENCOUNTER — Ambulatory Visit: Payer: Medicare HMO | Admitting: Vascular Surgery

## 2022-09-14 DIAGNOSIS — I714 Abdominal aortic aneurysm, without rupture, unspecified: Secondary | ICD-10-CM

## 2022-09-14 MED ORDER — AMLODIPINE BESYLATE 10 MG PO TABS
10.0000 mg | ORAL_TABLET | Freq: Every day | ORAL | 0 refills | Status: DC
  Filled 2022-09-14 – 2022-09-27 (×2): qty 90, 90d supply, fill #0

## 2022-09-21 ENCOUNTER — Ambulatory Visit (INDEPENDENT_AMBULATORY_CARE_PROVIDER_SITE_OTHER): Payer: Medicare HMO | Admitting: Vascular Surgery

## 2022-09-21 DIAGNOSIS — Z9889 Other specified postprocedural states: Secondary | ICD-10-CM

## 2022-09-21 DIAGNOSIS — Z8679 Personal history of other diseases of the circulatory system: Secondary | ICD-10-CM

## 2022-09-21 NOTE — Progress Notes (Signed)
Office Note     CC:  AAA Requesting Provider:  [name], DO  HPI: Lawrence Bennett is a 67 y.o. (1955-10-08) male whom I called  in follow-up s/p 08/21/21 EVAR for 5.7cm abdominal aortic aneurysm.  During our phone call today, [name] was doing well.  He has had no issues since he was seen last in clinic.  He denies abdominal pain, back pain, chest pain.  He denies symptoms of claudication, no rest pain in the feet, no tissue loss. No concern for renal insufficiency.   The pt is  on a statin for cholesterol management.  The pt is not on a daily aspirin.   Other AC:  plavix The pt is on on medication for hypertension.   The pt is not diabetic.  Tobacco hx:  none  Past Medical History:  Diagnosis Date   AAA (abdominal aortic aneurysm) (HCC)    Anxiety    Atherosclerosis of aorta (HCC)    Atrial contractions, premature    Depression    Dyslipidemia    Dyspnea    Elevated glucose 11/11/2015   Hyperlipidemia    Mild hypertension    Stroke [facility])    Urinary hesitancy     Past Surgical History:  Procedure Laterality Date   ABDOMINAL AORTIC ENDOVASCULAR STENT GRAFT Bilateral 08/21/2021   Procedure: ENDOVASCULAR AORTIC STENT GRAFT REPAIR;  Surgeon: Broadus John, MD;  Location: [facility] OR;  Service: Vascular;  Laterality: Bilateral;   COLON RESECTION  2012   ULTRASOUND GUIDANCE FOR VASCULAR ACCESS Bilateral 08/21/2021   Procedure: ULTRASOUND GUIDANCE FOR VASCULAR ACCESS;  Surgeon: Broadus John, MD;  Location: [facility] OR;  Service: Vascular;  Laterality: Bilateral;    Social History   Socioeconomic History   Marital status: Married    Spouse name: Not on file   Number of children: 3   Years of education: Not on file   Highest education level: Not on file  Occupational History   Occupation: retired  Tobacco Use   Smoking status: Former    Packs/day: 0.20    Years: 19.00    Additional pack years: 0.00    Total pack years: 3.80    Types: Cigarettes    Quit date:  2015    Years since quitting: 9.2   Smokeless tobacco: Never  Vaping Use   Vaping Use: Never used  Substance and Sexual Activity   Alcohol use: No    Alcohol/week: 0.0 standard drinks of alcohol   Drug use: No   Sexual activity: Not Currently  Other Topics Concern   Not on file  Social History Narrative   Right handed   Lives with wife   Social Determinants of Health   Financial Resource Strain: Low Risk  (04/03/2021)   Overall Financial Resource Strain (CARDIA)    Difficulty of Paying Living Expenses: Not hard at all  Food Insecurity: No Food Insecurity (04/03/2021)   Hunger Vital Sign    Worried About Running Out of Food in the Last Year: Never true    [place] in the Last Year: Never true  Transportation Needs: No Transportation Needs (04/03/2021)   PRAPARE - Hydrologist (Medical): No    Lack of Transportation (Non-Medical): No  Physical Activity: Inactive (04/03/2021)   Exercise Vital Sign    Days of Exercise per Week: 0 days    Minutes of Exercise per Session: 0 min  Stress: No Stress Concern Present (04/03/2021)   [place]  [employer] - Occupational Stress Questionnaire    Feeling of Stress : Only a little  Social Connections: Socially Integrated (04/03/2021)   Social Connection and Isolation Panel [NHANES]    Frequency of Communication with Friends and Family: More than three times a week    Frequency of Social Gatherings with Friends and Family: More than three times a week    Attends Religious Services: 1 to 4 times per year    Active Member of [employer] or Organizations: Yes    Attends Archivist Meetings: 1 to 4 times per year    Marital Status: Married  Human resources officer Violence: Not At Risk (04/03/2021)   Humiliation, Afraid, Rape, and Kick questionnaire    Fear of Current or Ex-Partner: No    Emotionally Abused: No    Physically Abused: No    Sexually Abused: No    Family History  Problem  Relation Age of Onset   COPD Mother    Stroke Father    Bipolar disorder Father    Hypertension Sister    Depression Sister    Colon cancer Neg Hx    Esophageal cancer Neg Hx    Inflammatory bowel disease Neg Hx    Liver disease Neg Hx    Rectal cancer Neg Hx    Pancreatic cancer Neg Hx    Stomach cancer Neg Hx     Current Outpatient Medications  Medication Sig Dispense Refill   amLODipine (NORVASC) 10 MG tablet Take 1 tablet (10 mg total) by mouth daily. (NEEDS TO BE SEEN BEFORE NEXT REFILL) 90 tablet 0   amoxicillin-clavulanate (AUGMENTIN) 875-125 MG tablet Take 1 tablet by mouth 2 (two) times daily. 20 tablet 0   aspirin EC 81 MG tablet Take 81 mg by mouth daily. Swallow whole.     atorvastatin (LIPITOR) 80 MG tablet Take 1 tablet (80 mg total) by mouth daily. 90 tablet 0   clopidogrel (PLAVIX) 75 MG tablet Take 1 tablet by mouth once daily. 30 tablet 11   divalproex (DEPAKOTE ER) 500 MG 24 hr tablet Take 1 tablet (500 mg total) by mouth at bedtime. 90 tablet 2   LORazepam (ATIVAN) 0.5 MG tablet Take 1 tablet (0.5 mg total) by mouth 3 (three) times daily. 90 tablet 2   losartan (COZAAR) 50 MG tablet Take 1 tablet (50 mg total) by mouth daily. 90 tablet 3   NEOMYCIN-POLYMYXIN-HYDROCORTISONE (CORTISPORIN) 1 % SOLN OTIC solution Apply 1-2 drops to toe 2 times a day after soaking 10 mL 1   PARoxetine (PAXIL) 20 MG tablet Take 1 tablet (20 mg total) by mouth at bedtime. 90 tablet 2   QUEtiapine (SEROQUEL) 50 MG tablet Take 1 tablet (50 mg total) by mouth at bedtime. 90 tablet 2   tamsulosin (FLOMAX) 0.4 MG CAPS capsule Take 1 capsule (0.4 mg total) by mouth in the morning and at bedtime. 180 capsule 0   traZODone (DESYREL) 50 MG tablet Take 1 tablet (50 mg total) by mouth at bedtime. 90 tablet 2   venlafaxine XR (EFFEXOR-XR) 75 MG 24 hr capsule Take 1 capsule (75 mg total) by mouth daily with breakfast. 90 capsule 2   vitamin C (ASCORBIC ACID) 500 MG tablet Take 500 mg by mouth daily.      zinc gluconate 50 MG tablet Take 50 mg by mouth daily.     No current facility-administered medications for this visit.    No Known Allergies   REVIEW OF SYSTEMS:   [X]  denotes  positive finding, [ ]  denotes negative finding Cardiac  Comments:  Chest pain or chest pressure:    Shortness of breath upon exertion:    Short of breath when lying flat:    Irregular heart rhythm:        Vascular    Pain in calf, thigh, or hip brought on by ambulation:    Pain in feet at night that wakes you up from your sleep:     Blood clot in your veins:    Leg swelling:         Pulmonary    Oxygen at home:    Productive cough:     Wheezing:         Neurologic    Sudden weakness in arms or legs:     Sudden numbness in arms or legs:     Sudden onset of difficulty speaking or slurred speech:    Temporary loss of vision in one eye:     Problems with dizziness:         Gastrointestinal    Blood in stool:     Vomited blood:         Genitourinary    Burning when urinating:     Blood in urine:        Psychiatric    Major depression:         Hematologic    Bleeding problems:    Problems with blood clotting too easily:        Skin    Rashes or ulcers:        Constitutional    Fever or chills:      PHYSICAL EXAMINATION:  There were no vitals filed for this visit. Physical exam deferred due to phone call.    Non-Invasive Vascular Imaging:    09/29/21: CT angio abdomen pelvis reviewed demonstrating infrarenal placement of endovascular aortic repair.  Left-sided accessory renal covered as an intraoperatively Disease at the ostia of bilateral renal arteries present preop.  Appropriate filling of the kidneys. Type II endoleak from lumbar, IMA. No significant size change from previous Some compression of the left limb appreciated at the terminal aorta  EVAR duplex ultrasound and straits no endoleak, regression in sac size, widely patent left EVAR limb,   Widely patent right EVAR  limb.  ASSESSMENT/PLAN: Lawrence Bennett is a 67 y.o. male presenting s/p 08/21/21 EVAR for asymptomatic AAA.  Since last seen, Kjell has been doing well.  Imaging at his last visit was reviewed demonstrating regression in sac size, no endoleak, widely patent left EVAR limb.    In the interim, the right limb was evaluated, and found to be widely patent.  My plan is for Gilliam to follow-up in 6 months with repeat EVAR duplex, ABI. I asked that he continue his current medication regimen.  Should imaging demonstrates similar findings, will extend to yearly follow-up.   Broadus John, MD [facility] [phone]

## 2022-09-26 ENCOUNTER — Other Ambulatory Visit: Payer: Self-pay

## 2022-09-26 DIAGNOSIS — I7143 Infrarenal abdominal aortic aneurysm, without rupture: Secondary | ICD-10-CM

## 2022-09-26 DIAGNOSIS — Z8679 Personal history of other diseases of the circulatory system: Secondary | ICD-10-CM

## 2022-09-27 ENCOUNTER — Other Ambulatory Visit (HOSPITAL_COMMUNITY): Payer: Self-pay

## 2022-10-10 ENCOUNTER — Other Ambulatory Visit (HOSPITAL_COMMUNITY): Payer: Self-pay | Admitting: Psychiatry

## 2022-10-11 ENCOUNTER — Other Ambulatory Visit (HOSPITAL_COMMUNITY): Payer: Self-pay

## 2022-10-11 MED ORDER — LORAZEPAM 0.5 MG PO TABS
0.5000 mg | ORAL_TABLET | Freq: Three times a day (TID) | ORAL | 2 refills | Status: DC
  Filled 2022-10-11: qty 90, 30d supply, fill #0

## 2022-10-11 NOTE — Telephone Encounter (Signed)
Call for appt

## 2022-10-15 ENCOUNTER — Telehealth (HOSPITAL_COMMUNITY): Payer: Medicare HMO | Admitting: Psychiatry

## 2022-10-18 ENCOUNTER — Other Ambulatory Visit: Payer: Self-pay | Admitting: Neurology

## 2022-10-18 MED ORDER — CLOPIDOGREL BISULFATE 75 MG PO TABS
75.0000 mg | ORAL_TABLET | Freq: Every day | ORAL | 11 refills | Status: DC
  Filled 2022-10-18: qty 30, 30d supply, fill #0
  Filled 2022-11-22: qty 30, 30d supply, fill #1
  Filled 2022-12-18: qty 30, 30d supply, fill #2
  Filled 2023-01-14 – 2023-01-16 (×2): qty 30, 30d supply, fill #3
  Filled 2023-02-13: qty 30, 30d supply, fill #4
  Filled 2023-03-13: qty 30, 30d supply, fill #5
  Filled 2023-04-09: qty 30, 30d supply, fill #6
  Filled 2023-05-09: qty 30, 30d supply, fill #7
  Filled 2023-06-06: qty 30, 30d supply, fill #8
  Filled 2023-07-06: qty 30, 30d supply, fill #9
  Filled 2023-08-05: qty 30, 30d supply, fill #10

## 2022-10-19 ENCOUNTER — Other Ambulatory Visit (HOSPITAL_COMMUNITY): Payer: Self-pay

## 2022-10-29 ENCOUNTER — Other Ambulatory Visit: Payer: Self-pay | Admitting: Family Medicine

## 2022-10-29 ENCOUNTER — Other Ambulatory Visit (HOSPITAL_COMMUNITY): Payer: Self-pay

## 2022-10-29 DIAGNOSIS — N401 Enlarged prostate with lower urinary tract symptoms: Secondary | ICD-10-CM

## 2022-10-30 ENCOUNTER — Other Ambulatory Visit (HOSPITAL_COMMUNITY): Payer: Self-pay

## 2022-10-31 ENCOUNTER — Ambulatory Visit (HOSPITAL_COMMUNITY): Payer: Medicare HMO | Admitting: Psychiatry

## 2022-10-31 ENCOUNTER — Encounter (HOSPITAL_COMMUNITY): Payer: Self-pay | Admitting: Psychiatry

## 2022-10-31 ENCOUNTER — Other Ambulatory Visit (HOSPITAL_COMMUNITY): Payer: Self-pay

## 2022-10-31 VITALS — BP 133/76 | HR 100 | Ht 70.0 in | Wt 265.0 lb

## 2022-10-31 DIAGNOSIS — F3160 Bipolar disorder, current episode mixed, unspecified: Secondary | ICD-10-CM | POA: Diagnosis not present

## 2022-10-31 MED ORDER — QUETIAPINE FUMARATE 50 MG PO TABS
50.0000 mg | ORAL_TABLET | Freq: Every day | ORAL | 2 refills | Status: DC
  Filled 2022-10-31 – 2022-11-22 (×2): qty 90, 90d supply, fill #0
  Filled 2023-02-16: qty 90, 90d supply, fill #1

## 2022-10-31 MED ORDER — PAROXETINE HCL 20 MG PO TABS
20.0000 mg | ORAL_TABLET | Freq: Every day | ORAL | 2 refills | Status: DC
  Filled 2022-10-31 – 2022-12-19 (×2): qty 90, 90d supply, fill #0
  Filled 2023-03-23: qty 90, 90d supply, fill #1

## 2022-10-31 MED ORDER — VENLAFAXINE HCL ER 75 MG PO CP24
75.0000 mg | ORAL_CAPSULE | Freq: Every day | ORAL | 2 refills | Status: DC
  Filled 2022-10-31 – 2023-01-25 (×2): qty 90, 90d supply, fill #0
  Filled 2023-04-23: qty 90, 90d supply, fill #1

## 2022-10-31 MED ORDER — DIVALPROEX SODIUM ER 500 MG PO TB24
500.0000 mg | ORAL_TABLET | Freq: Every day | ORAL | 2 refills | Status: DC
  Filled 2022-10-31 – 2022-12-10 (×2): qty 90, 90d supply, fill #0
  Filled 2023-03-04: qty 90, 90d supply, fill #1

## 2022-10-31 MED ORDER — TRAZODONE HCL 50 MG PO TABS
50.0000 mg | ORAL_TABLET | Freq: Every day | ORAL | 2 refills | Status: DC
  Filled 2022-10-31 – 2022-12-19 (×2): qty 90, 90d supply, fill #0
  Filled 2023-03-23: qty 90, 90d supply, fill #1

## 2022-10-31 MED ORDER — LORAZEPAM 0.5 MG PO TABS
0.5000 mg | ORAL_TABLET | Freq: Three times a day (TID) | ORAL | 2 refills | Status: DC
  Filled 2022-10-31 – 2022-11-08 (×2): qty 90, 30d supply, fill #0
  Filled 2022-12-10: qty 90, 30d supply, fill #1
  Filled 2023-01-03: qty 90, 30d supply, fill #2

## 2022-10-31 NOTE — Progress Notes (Signed)
BH MD/PA/NP OP Progress Note  10/31/2022 1:22 PM Lawrence Bennett  MRN:  [id number]  Chief Complaint:  Chief Complaint  Patient presents with   Depression   Anxiety   Follow-up   HPI: This patient is a 67 year old married white male who lives with his wife and 3 children in [place]. 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   The patient returns for follow-up after about 6 months regarding his bipolar disorder.  He is doing well by his report.  He had a small lacunar stroke about 2 years ago but has recovered well and has not had any falls.  Sometimes he is a little bit out of balance.  He continues to do all the things that he enjoys.  He admits that his diet is not great and he eats too much sugar but his A1c looks good.  He denies depression anxiety thoughts of self-harm or suicide auditory or visual hallucinations or paranoia. Visit Diagnosis:    ICD-10-CM   1. Bipolar I disorder, most recent episode mixed (HCC)  F31.60 Valproic Acid level      Past Psychiatric History: Hospitalization in the Geri psychiatry unit in 2015  Past Medical History:  Past Medical History:  Diagnosis Date   AAA (abdominal aortic aneurysm) (HCC)    Anxiety    Atherosclerosis of aorta (HCC)    Atrial contractions, premature    Depression    Dyslipidemia    Dyspnea    Elevated glucose 11/11/2015   Hyperlipidemia    Mild hypertension    Stroke [facility])    Urinary hesitancy     Past Surgical History:  Procedure Laterality Date   ABDOMINAL AORTIC ENDOVASCULAR STENT GRAFT Bilateral 08/21/2021   Procedure: ENDOVASCULAR AORTIC STENT GRAFT REPAIR;  Surgeon: Victorino Sparrow, MD;  Location: [facility] OR;  Service: Vascular;  Laterality: Bilateral;   COLON RESECTION  2012   ULTRASOUND GUIDANCE FOR VASCULAR ACCESS Bilateral 08/21/2021   Procedure: ULTRASOUND GUIDANCE FOR VASCULAR ACCESS;  Surgeon: Victorino Sparrow, MD;  Location: [facility] OR;  Service: Vascular;   Laterality: Bilateral;    Family Psychiatric History: See below  Family History:  Family History  Problem Relation Age of Onset   COPD Mother    Stroke Father    Bipolar disorder Father    Hypertension Sister    Depression Sister    Colon cancer Neg Hx    Esophageal cancer Neg Hx    Inflammatory bowel disease Neg Hx    Liver disease Neg Hx    Rectal cancer Neg Hx    Pancreatic cancer Neg Hx    Stomach cancer Neg Hx     Social History:  Social History   Socioeconomic History   Marital status: Married    Spouse name: Not on file   Number of children: 3   Years of education: Not on file   Highest education level: Not on file  Occupational History   Occupation: retired  Tobacco Use   Smoking status: Former    Packs/day: 0.20    Years: 19.00    Additional pack years: 0.00    Total pack years: 3.80    Types: Cigarettes    Quit date: 2015    Years since quitting: 9.3   Smokeless tobacco: Never  Vaping Use   Vaping Use: Never used  Substance and Sexual Activity   Alcohol use: No    Alcohol/week: 0.0 standard drinks of alcohol  Drug use: No   Sexual activity: Not Currently  Other Topics Concern   Not on file  Social History Narrative   Right handed   Lives with wife   Social Determinants of Health   Financial Resource Strain: Low Risk  (04/03/2021)   Overall Financial Resource Strain (CARDIA)    Difficulty of Paying Living Expenses: Not hard at all  Food Insecurity: No Food Insecurity (04/03/2021)   Hunger Vital Sign    Worried About Running Out of Food in the Last Year: Never true    Ran Out of Food in the Last Year: Never true  Transportation Needs: No Transportation Needs (04/03/2021)   PRAPARE - Administrator, Civil Service (Medical): No    Lack of Transportation (Non-Medical): No  Physical Activity: Inactive (04/03/2021)   Exercise Vital Sign    Days of Exercise per Week: 0 days    Minutes of Exercise per Session: 0 min  Stress: No Stress  Concern Present (04/03/2021)   [employer] of Occupational Health - Occupational Stress Questionnaire    Feeling of Stress : Only a little  Social Connections: Socially Integrated (04/03/2021)   Social Connection and Isolation Panel [NHANES]    Frequency of Communication with Friends and Family: More than three times a week    Frequency of Social Gatherings with Friends and Family: More than three times a week    Attends Religious Services: 1 to 4 times per year    Active Member of Clubs or Organizations: Yes    Attends Banker Meetings: 1 to 4 times per year    Marital Status: Married    Allergies: No Known Allergies  Metabolic Disorder Labs: Lab Results  Component Value Date   HGBA1C 6.4 (H) 04/18/2022   No results found for: "PROLACTIN" Lab Results  Component Value Date   CHOL 129 04/18/2022   TRIG 81 04/18/2022   HDL 40 04/18/2022   CHOLHDL 3.2 04/18/2022   LDLCALC 73 04/18/2022   LDLCALC 93 10/23/2021   Lab Results  Component Value Date   TSH 3.040 10/23/2021   TSH 4.420 03/10/2021    Therapeutic Level Labs: No results found for: "LITHIUM" Lab Results  Component Value Date   VALPROATE 41.8 (L) 11/29/2016   VALPROATE 44 (L) 11/11/2015   No results found for: "CBMZ"  Current Medications: Current Outpatient Medications  Medication Sig Dispense Refill   amLODipine (NORVASC) 10 MG tablet Take 1 tablet (10 mg total) by mouth daily. (NEEDS TO BE SEEN BEFORE NEXT REFILL) 90 tablet 0   aspirin EC 81 MG tablet Take 81 mg by mouth daily. Swallow whole.     atorvastatin (LIPITOR) 80 MG tablet Take 1 tablet (80 mg total) by mouth daily. 90 tablet 0   clopidogrel (PLAVIX) 75 MG tablet Take 1 tablet by mouth once daily. 30 tablet 11   losartan (COZAAR) 50 MG tablet Take 1 tablet (50 mg total) by mouth daily. 90 tablet 3   tamsulosin (FLOMAX) 0.4 MG CAPS capsule Take 1 capsule (0.4 mg total) by mouth in the morning and at bedtime. 180 capsule 0   vitamin C  (ASCORBIC ACID) 500 MG tablet Take 500 mg by mouth daily.     zinc gluconate 50 MG tablet Take 50 mg by mouth daily.     divalproex (DEPAKOTE ER) 500 MG 24 hr tablet Take 1 tablet (500 mg total) by mouth at bedtime. 90 tablet 2   LORazepam (ATIVAN) 0.5 MG tablet Take 1  tablet (0.5 mg) by mouth 3 times daily. 90 tablet 2   PARoxetine (PAXIL) 20 MG tablet Take 1 tablet (20 mg total) by mouth at bedtime. 90 tablet 2   QUEtiapine (SEROQUEL) 50 MG tablet Take 1 tablet (50 mg total) by mouth at bedtime. 90 tablet 2   traZODone (DESYREL) 50 MG tablet Take 1 tablet (50 mg total) by mouth at bedtime. 90 tablet 2   venlafaxine XR (EFFEXOR-XR) 75 MG 24 hr capsule Take 1 capsule (75 mg total) by mouth daily with breakfast. 90 capsule 2   No current facility-administered medications for this visit.     Musculoskeletal: Strength & Muscle Tone: within normal limits Gait & Station: normal Patient leans: N/A  Psychiatric Specialty Exam: Review of Systems  All other systems reviewed and are negative.   Blood pressure 133/76, pulse 100, height 5\' 10"  (1.778 m), weight 265 lb (120.2 kg), SpO2 97 %.Body mass index is 38.02 kg/m.  General Appearance: Casual and Fairly Groomed  Eye Contact:  Good  Speech:  Clear and Coherent  Volume:  Normal  Mood:  Euthymic  Affect:  Congruent  Thought Process:  Goal Directed  Orientation:  Full (Time, Place, and Person)  Thought Content: WDL   Suicidal Thoughts:  No  Homicidal Thoughts:  No  Memory:  Immediate;   Good Recent;   Good Remote;   NA  Judgement:  Good  Insight:  Good  Psychomotor Activity:  Normal  Concentration:  Concentration: Good and Attention Span: Good  Recall:  Good  Fund of Knowledge: Good  Language: Good  Akathisia:  No  Handed:  Right  AIMS (if indicated): not done  Assets:  Communication Skills Desire for Improvement Resilience Social Support Talents/Skills  ADL's:  Intact  Cognition: WNL  Sleep:  Good   Screenings: GAD-7     Flowsheet Row Office Visit from 10/31/2022 in [facility] [place] Office Visit from 07/31/2022 in [facility] Office Visit from 05/15/2022 in [facility] Office Visit from 10/12/2021 in [facility] Office Visit from 04/11/2021 in [facility]  Total GAD-7 Score 0 0 0 0 0      PHQ2-9    Flowsheet Row Office Visit from 10/31/2022 in [facility] [place] Office Visit from 07/31/2022 in [facility] Office Visit from 05/15/2022 in [facility] Video Visit from 04/12/2022 in [facility] [place] Clinical Support from 04/04/2022 in [facility]  PHQ-2 Total Score 0 0 0 0 0  PHQ-9 Total Score 0 0 -- -- --      Flowsheet Row Pre-Admission Testing 60 from 08/18/2021 in [facility] PREADMISSION TESTING Video Visit from 02/13/2021 in [facility] [place]  C-SSRS RISK CATEGORY No Risk No Risk        Assessment and Plan: This patient is a 67 year old male with a history of severe depression requiring ECT in the past.  However he is doing well in terms of his mental status.  He will continue lorazepam 0.5 mg 3 times daily for anxiety, Paxil 20 mg at bedtime for depression, Seroquel 50 mg at bedtime for mood stabilization, trazodone 50 mg at bedtime for sleep Effexor XR 75 mg daily for depression and Depakote ER 500 mg at bedtime for mood stabilization.  We  will check out Depakote level.  He will return to see me in 6 months  Collaboration of Care: Collaboration of Care: Primary Care Provider AEB notes are shared with PCP on the epic system  Patient/Guardian was advised Release of Information must be obtained prior to any  record release in order to collaborate their care with an outside provider. Patient/Guardian was advised if they have not already done so to contact the registration department to sign all necessary forms in order for [place] to release information regarding their care.   Consent: Patient/Guardian gives verbal consent for treatment and assignment of benefits for services provided during this visit. Patient/Guardian expressed understanding and agreed to proceed.    Diannia Ruder, MD 10/31/2022, 1:22 PM

## 2022-11-08 ENCOUNTER — Other Ambulatory Visit (HOSPITAL_COMMUNITY): Payer: Self-pay

## 2022-11-08 ENCOUNTER — Other Ambulatory Visit: Payer: Self-pay

## 2022-11-21 DIAGNOSIS — F3160 Bipolar disorder, current episode mixed, unspecified: Secondary | ICD-10-CM | POA: Diagnosis not present

## 2022-11-22 ENCOUNTER — Other Ambulatory Visit (HOSPITAL_COMMUNITY): Payer: Self-pay

## 2022-11-22 ENCOUNTER — Other Ambulatory Visit: Payer: Self-pay | Admitting: Family Medicine

## 2022-11-22 DIAGNOSIS — E782 Mixed hyperlipidemia: Secondary | ICD-10-CM

## 2022-11-22 LAB — VALPROIC ACID LEVEL: Valproic Acid Lvl: 65.3 mg/L (ref 50.0–100.0)

## 2022-11-23 ENCOUNTER — Other Ambulatory Visit (HOSPITAL_COMMUNITY): Payer: Self-pay

## 2022-11-23 MED ORDER — ATORVASTATIN CALCIUM 80 MG PO TABS
80.0000 mg | ORAL_TABLET | Freq: Every day | ORAL | 0 refills | Status: DC
Start: 2022-11-23 — End: 2023-02-14
  Filled 2022-11-23: qty 90, 90d supply, fill #0

## 2022-12-10 ENCOUNTER — Other Ambulatory Visit (HOSPITAL_COMMUNITY): Payer: Self-pay

## 2022-12-19 ENCOUNTER — Other Ambulatory Visit: Payer: Self-pay | Admitting: Family Medicine

## 2022-12-19 DIAGNOSIS — I10 Essential (primary) hypertension: Secondary | ICD-10-CM

## 2022-12-20 ENCOUNTER — Other Ambulatory Visit: Payer: Self-pay

## 2022-12-20 ENCOUNTER — Other Ambulatory Visit (HOSPITAL_COMMUNITY): Payer: Self-pay

## 2022-12-20 MED ORDER — AMLODIPINE BESYLATE 10 MG PO TABS
10.0000 mg | ORAL_TABLET | Freq: Every day | ORAL | 0 refills | Status: DC
Start: 2022-12-20 — End: 2023-02-23
  Filled 2022-12-20: qty 60, 60d supply, fill #0

## 2022-12-21 ENCOUNTER — Other Ambulatory Visit (HOSPITAL_COMMUNITY): Payer: Self-pay

## 2022-12-21 ENCOUNTER — Other Ambulatory Visit: Payer: Self-pay | Admitting: Family Medicine

## 2022-12-21 DIAGNOSIS — N401 Enlarged prostate with lower urinary tract symptoms: Secondary | ICD-10-CM

## 2022-12-21 MED ORDER — TAMSULOSIN HCL 0.4 MG PO CAPS
0.4000 mg | ORAL_CAPSULE | Freq: Two times a day (BID) | ORAL | 0 refills | Status: DC
Start: 2022-12-21 — End: 2023-05-23
  Filled 2023-02-27: qty 180, 90d supply, fill #0

## 2022-12-25 ENCOUNTER — Other Ambulatory Visit (HOSPITAL_COMMUNITY): Payer: Self-pay

## 2022-12-25 ENCOUNTER — Telehealth (INDEPENDENT_AMBULATORY_CARE_PROVIDER_SITE_OTHER): Payer: Medicare HMO | Admitting: Family

## 2022-12-25 ENCOUNTER — Encounter: Payer: Self-pay | Admitting: Family

## 2022-12-25 DIAGNOSIS — J209 Acute bronchitis, unspecified: Secondary | ICD-10-CM | POA: Diagnosis not present

## 2022-12-25 MED ORDER — PREDNISONE 20 MG PO TABS
40.0000 mg | ORAL_TABLET | Freq: Every day | ORAL | 0 refills | Status: AC
Start: 2022-12-25 — End: 2022-12-31
  Filled 2022-12-25: qty 10, 5d supply, fill #0

## 2022-12-25 MED ORDER — CETIRIZINE HCL 10 MG PO TABS
10.0000 mg | ORAL_TABLET | Freq: Every day | ORAL | 1 refills | Status: DC
Start: 2022-12-25 — End: 2023-05-23
  Filled 2022-12-25: qty 90, 90d supply, fill #0
  Filled 2023-03-19: qty 90, 90d supply, fill #1

## 2022-12-25 MED ORDER — BENZONATATE 200 MG PO CAPS
200.0000 mg | ORAL_CAPSULE | Freq: Three times a day (TID) | ORAL | 1 refills | Status: DC | PRN
Start: 2022-12-25 — End: 2023-05-22
  Filled 2022-12-25: qty 30, 10d supply, fill #0

## 2022-12-25 MED ORDER — FLUTICASONE PROPIONATE 50 MCG/ACT NA SUSP
2.0000 | Freq: Every day | NASAL | 6 refills | Status: DC
Start: 2022-12-25 — End: 2023-05-23
  Filled 2022-12-25: qty 16, 30d supply, fill #0
  Filled 2023-01-18: qty 16, 30d supply, fill #1
  Filled 2023-02-18: qty 16, 30d supply, fill #2
  Filled 2023-03-19 – 2023-03-26 (×3): qty 16, 30d supply, fill #3
  Filled 2023-04-26: qty 16, 30d supply, fill #4

## 2022-12-25 NOTE — Progress Notes (Signed)
Virtual Visit Consent   Lawrence Bennett, you are scheduled for a virtual visit with a [facility] provider today. Just as with appointments in the office, your consent must be obtained to participate. Your consent will be active for this visit and any virtual visit you may have with one of our providers in the next 365 days. If you have a MyChart account, a copy of this consent can be sent to you electronically.  As this is a virtual visit, video technology does not allow for your provider to perform a traditional examination. This may limit your provider's ability to fully assess your condition. If your provider identifies any concerns that need to be evaluated in person or the need to arrange testing (such as labs, EKG, etc.), we will make arrangements to do so. Although advances in technology are sophisticated, we cannot ensure that it will always work on either your end or our end. If the connection with a video visit is poor, the visit may have to be switched to a telephone visit. With either a video or telephone visit, we are not always able to ensure that we have a secure connection.  By engaging in this virtual visit, you consent to the provision of healthcare and authorize for your insurance to be billed (if applicable) for the services provided during this visit. Depending on your insurance coverage, you may receive a charge related to this service.  I need to obtain your verbal consent now. Are you willing to proceed with your visit today? KENYATTE CHATMON has provided verbal consent on 12/25/2022 for a virtual visit (video or telephone). Jannifer Rodney, FNP  Date: 12/25/2022 2:23 PM  Virtual Visit via Video Note   I, Jannifer Rodney, connected with  Lawrence Bennett  ([id number], 1956/01/16) on 12/25/22 at  4:30 PM EDT by a video-enabled telemedicine application and verified that I am speaking with the correct person using two identifiers.  Location: Patient: Virtual Visit Location Patient:  Other: car Provider: Virtual Visit Location Provider: Home Office   I discussed the limitations of evaluation and management by telemedicine and the availability of in person appointments. The patient expressed understanding and agreed to proceed.    History of Present Illness: Lawrence Bennett is a 67 y.o. who identifies as a male who was assigned male at birth, and is being seen today for cough.  HPI: Cough This is a new problem. The current episode started in the past 7 days. The problem has been gradually worsening. The problem occurs every few minutes. The cough is Non-productive. Associated symptoms include ear pain, headaches and wheezing. Pertinent negatives include no chills, ear congestion, fever, nasal congestion, postnasal drip or shortness of breath. He has tried rest for the symptoms. The treatment provided mild relief.    Problems:  Patient Active Problem List   Diagnosis Date Noted   Abdominal aortic aneurysm (AAA) greater than 5.5 cm in diameter in male (HCC) 08/21/2021   Bilateral sensorineural hearing loss 04/03/2021   Loose stools 12/13/2019   Onychomycosis of toenail 08/26/2018   Pre-diabetes 07/25/2017   History of stroke 03/23/2016   HLD (hyperlipidemia) 12/20/2015   BPH (benign prostatic hyperplasia) 11/11/2015   Major depressive disorder, recurrent episode, moderate (HCC)    Diverticulosis of colon without hemorrhage 05/02/2014   Essential hypertension 04/15/2014   Anxiety 04/15/2014    Allergies: No Known Allergies Medications:  Current Outpatient Medications:    benzonatate (TESSALON) 200 MG capsule, Take 1 capsule (200 mg total)  by mouth 3 (three) times daily as needed., Disp: 30 capsule, Rfl: 1   cetirizine (ZYRTEC ALLERGY) 10 MG tablet, Take 1 tablet (10 mg total) by mouth daily., Disp: 90 tablet, Rfl: 1   fluticasone (FLONASE) 50 MCG/ACT nasal spray, Place 2 sprays into both nostrils daily., Disp: 16 g, Rfl: 6   predniSONE (DELTASONE) 20 MG tablet, Take  2 tablets (40 mg total) by mouth daily with breakfast for 5 days., Disp: 10 tablet, Rfl: 0   amLODipine (NORVASC) 10 MG tablet, Take 1 tablet (10 mg) by mouth daily. (NEEDS TO BE SEEN BEFORE NEXT REFILL), Disp: 60 tablet, Rfl: 0   aspirin EC 81 MG tablet, Take 81 mg by mouth daily. Swallow whole., Disp: , Rfl:    atorvastatin (LIPITOR) 80 MG tablet, Take 1 tablet (80 mg total) by mouth daily., Disp: 90 tablet, Rfl: 0   clopidogrel (PLAVIX) 75 MG tablet, Take 1 tablet by mouth once daily., Disp: 30 tablet, Rfl: 11   divalproex (DEPAKOTE ER) 500 MG 24 hr tablet, Take 1 tablet (500 mg) by mouth at bedtime., Disp: 90 tablet, Rfl: 2   ketoconazole (NIZORAL) 2 % cream, Apply 1 application topically daily for 28 days., Disp: 60 g, Rfl: 0   LORazepam (ATIVAN) 0.5 MG tablet, Take 1 tablet (0.5 mg) by mouth 3 times daily., Disp: 90 tablet, Rfl: 2   losartan (COZAAR) 50 MG tablet, Take 1 tablet (50 mg total) by mouth daily., Disp: 90 tablet, Rfl: 3   PARoxetine (PAXIL) 20 MG tablet, Take 1 tablet (20 mg total) by mouth at bedtime., Disp: 90 tablet, Rfl: 2   QUEtiapine (SEROQUEL) 50 MG tablet, Take 1 tablet (50 mg total) by mouth at bedtime., Disp: 90 tablet, Rfl: 2   tamsulosin (FLOMAX) 0.4 MG CAPS capsule, Take 1 capsule (0.4 mg total) by mouth in the morning and at bedtime., Disp: 180 capsule, Rfl: 0   traZODone (DESYREL) 50 MG tablet, Take 1 tablet (50 mg total) by mouth at bedtime., Disp: 90 tablet, Rfl: 2   venlafaxine XR (EFFEXOR-XR) 75 MG 24 hr capsule, Take 1 capsule (75 mg total) by mouth daily with breakfast., Disp: 90 capsule, Rfl: 2   vitamin C (ASCORBIC ACID) 500 MG tablet, Take 500 mg by mouth daily., Disp: , Rfl:    zinc gluconate 50 MG tablet, Take 50 mg by mouth daily., Disp: , Rfl:   Observations/Objective: Patient is well-developed, well-nourished in no acute distress.  Resting comfortably  Head is normocephalic, atraumatic.  No labored breathing.  Speech is clear and coherent with  logical content.  Patient is alert and oriented at baseline.    Assessment and Plan: 1. Acute bronchitis, unspecified organism - cetirizine (ZYRTEC ALLERGY) 10 MG tablet; Take 1 tablet (10 mg total) by mouth daily.  Dispense: 90 tablet; Refill: 1 - fluticasone (FLONASE) 50 MCG/ACT nasal spray; Place 2 sprays into both nostrils daily.  Dispense: 16 g; Refill: 6 - predniSONE (DELTASONE) 20 MG tablet; Take 2 tablets (40 mg total) by mouth daily with breakfast for 5 days.  Dispense: 10 tablet; Refill: 0 - benzonatate (TESSALON) 200 MG capsule; Take 1 capsule (200 mg total) by mouth 3 (three) times daily as needed.  Dispense: 30 capsule; Refill: 1  - Take meds as prescribed - Use a cool mist humidifier  -Use saline nose sprays frequently -Force fluids -For any cough or congestion  Use plain Mucinex- regular strength or max strength is fine -For fever or aces or pains- take tylenol or  ibuprofen. -Throat lozenges if help -New toothbrush in 3 days -Follow up if symptoms worsen or do not improve   Follow Up Instructions: I discussed the assessment and treatment plan with the patient. The patient was provided an opportunity to ask questions and all were answered. The patient agreed with the plan and demonstrated an understanding of the instructions.  A copy of instructions were sent to the patient via MyChart unless otherwise noted below.     The patient was advised to call back or seek an in-person evaluation if the symptoms worsen or if the condition fails to improve as anticipated.  Time:  I spent 13 minutes with the patient via telehealth technology discussing the above problems/concerns.    Jannifer Rodney, FNP

## 2022-12-28 ENCOUNTER — Ambulatory Visit: Payer: Medicare HMO | Admitting: Cardiology

## 2022-12-28 ENCOUNTER — Encounter: Payer: Self-pay | Admitting: Cardiology

## 2022-12-28 VITALS — BP 126/72 | HR 77 | Resp 16 | Ht 70.0 in | Wt 263.4 lb

## 2022-12-28 DIAGNOSIS — I10 Essential (primary) hypertension: Secondary | ICD-10-CM

## 2022-12-28 DIAGNOSIS — Z8679 Personal history of other diseases of the circulatory system: Secondary | ICD-10-CM | POA: Diagnosis not present

## 2022-12-28 DIAGNOSIS — Z8673 Personal history of transient ischemic attack (TIA), and cerebral infarction without residual deficits: Secondary | ICD-10-CM | POA: Diagnosis not present

## 2022-12-28 DIAGNOSIS — R0609 Other forms of dyspnea: Secondary | ICD-10-CM | POA: Diagnosis not present

## 2022-12-28 DIAGNOSIS — I714 Abdominal aortic aneurysm, without rupture, unspecified: Secondary | ICD-10-CM

## 2022-12-28 DIAGNOSIS — Z9889 Other specified postprocedural states: Secondary | ICD-10-CM | POA: Diagnosis not present

## 2022-12-28 NOTE — Progress Notes (Signed)
ID:  Lawrence Bennett, DOB 09-29-1955, MRN [id number]  PCP:  Raliegh Ip, DO  Cardiologist:  Tessa Lerner, DO, [facility] (established care 04/13/2021) Former Cardiology Providers: Dr. Zoila Shutter   Date: 12/28/22 Last Office Visit: 12/22/2021  Chief Complaint  Patient presents with   Dyspnea on exertion   Follow-up    1 year    HPI  Lawrence Bennett is a 67 y.o. male whose past medical history and cardiovascular risk factors include: History of PVCs, abdominal aortic aneurysm 5.7 cm status post EVAR (08/21/2021), history of stroke, former smoker, hypertension, hyperlipidemia, aortic atherosclerosis, obesity due to excess calories  He had establish care back in October 2023 after noting bilateral CVAs on imaging study and the concern for possible cardioembolic etiology.  During her prior office visits we have discussed undergoing Holter monitor versus loop recorder to monitor for A-fib given his history.  At last office visit they were interested in undergoing a loop recorder implant but wanted to wait until the summer is over.  However were lost to follow-up.  Now presents for 1 year follow-up visit.  Denies anginal chest pain or heart failure symptoms.  No recent hospitalizations or urgent care visits for cardiovascular reasons.  Dyspnea is well-controlled without exacerbation.   FUNCTIONAL STATUS: No structured exercise program or daily routine but patient keeps himself active around the house.  ALLERGIES: No Known Allergies  MEDICATION LIST PRIOR TO VISIT: Current Meds  Medication Sig   amLODipine (NORVASC) 10 MG tablet Take 1 tablet (10 mg) by mouth daily. (NEEDS TO BE SEEN BEFORE NEXT REFILL)   aspirin EC 81 MG tablet Take 81 mg by mouth daily. Swallow whole.   atorvastatin (LIPITOR) 80 MG tablet Take 1 tablet (80 mg total) by mouth daily.   benzonatate (TESSALON) 200 MG capsule Take 1 capsule (200 mg total) by mouth 3 (three) times daily as needed.   cetirizine (ZYRTEC  ALLERGY) 10 MG tablet Take 1 tablet (10 mg total) by mouth daily.   clopidogrel (PLAVIX) 75 MG tablet Take 1 tablet by mouth once daily.   divalproex (DEPAKOTE ER) 500 MG 24 hr tablet Take 1 tablet (500 mg) by mouth at bedtime.   fluticasone (FLONASE) 50 MCG/ACT nasal spray Place 2 sprays into both nostrils daily.   ketoconazole (NIZORAL) 2 % cream Apply 1 application topically daily for 28 days.   LORazepam (ATIVAN) 0.5 MG tablet Take 1 tablet (0.5 mg) by mouth 3 times daily.   losartan (COZAAR) 50 MG tablet Take 1 tablet (50 mg total) by mouth daily.   PARoxetine (PAXIL) 20 MG tablet Take 1 tablet (20 mg total) by mouth at bedtime.   predniSONE (DELTASONE) 20 MG tablet Take 2 tablets (40 mg total) by mouth daily with breakfast for 5 days.   QUEtiapine (SEROQUEL) 50 MG tablet Take 1 tablet (50 mg total) by mouth at bedtime.   tamsulosin (FLOMAX) 0.4 MG CAPS capsule Take 1 capsule (0.4 mg total) by mouth in the morning and at bedtime.   traZODone (DESYREL) 50 MG tablet Take 1 tablet (50 mg total) by mouth at bedtime.   venlafaxine XR (EFFEXOR-XR) 75 MG 24 hr capsule Take 1 capsule (75 mg total) by mouth daily with breakfast.   vitamin C (ASCORBIC ACID) 500 MG tablet Take 500 mg by mouth daily.   zinc gluconate 50 MG tablet Take 50 mg by mouth daily.     PAST MEDICAL HISTORY: Past Medical History:  Diagnosis Date   AAA (abdominal  aortic aneurysm) (HCC)    Anxiety    Atherosclerosis of aorta (HCC)    Atrial contractions, premature    Depression    Dyslipidemia    Dyspnea    Elevated glucose 11/11/2015   Hyperlipidemia    Mild hypertension    Stroke [facility])    Urinary hesitancy     PAST SURGICAL HISTORY: Past Surgical History:  Procedure Laterality Date   ABDOMINAL AORTIC ENDOVASCULAR STENT GRAFT Bilateral 08/21/2021   Procedure: ENDOVASCULAR AORTIC STENT GRAFT REPAIR;  Surgeon: Victorino Sparrow, MD;  Location: [facility] OR;  Service: Vascular;  Laterality: Bilateral;   COLON RESECTION   2012   ULTRASOUND GUIDANCE FOR VASCULAR ACCESS Bilateral 08/21/2021   Procedure: ULTRASOUND GUIDANCE FOR VASCULAR ACCESS;  Surgeon: Victorino Sparrow, MD;  Location: [facility] OR;  Service: Vascular;  Laterality: Bilateral;    FAMILY HISTORY: The patient family history includes Bipolar disorder in his father; COPD in his mother; Depression in his sister; Hypertension in his sister; Stroke in his father.  SOCIAL HISTORY:  The patient  reports that he quit smoking about 9 years ago. His smoking use included cigarettes. He has a 3.80 pack-year smoking history. He has never used smokeless tobacco. He reports that he does not drink alcohol and does not use drugs.  REVIEW OF SYSTEMS: Review of Systems  Constitutional: Negative for chills and fever.  HENT:  Positive for hearing loss (hard of hearing). Negative for hoarse voice and nosebleeds.   Eyes:  Negative for discharge, double vision and pain.  Cardiovascular:  Negative for chest pain, claudication, dyspnea on exertion, leg swelling, near-syncope, orthopnea, palpitations, paroxysmal nocturnal dyspnea and syncope.  Respiratory:  Positive for snoring. Negative for hemoptysis and shortness of breath.   Musculoskeletal:  Negative for muscle cramps and myalgias.  Gastrointestinal:  Negative for abdominal pain, constipation, diarrhea, hematemesis, hematochezia, melena, nausea and vomiting.  Neurological:  Negative for dizziness and light-headedness.       At times difficulty with balance.     PHYSICAL EXAM:    12/28/2022    1:57 PM 10/31/2022    1:04 PM 08/10/2022   10:59 AM  Vitals with BMI  Height 5\' 10"   5\' 10"   Weight 263 lbs 6 oz  259 lbs  BMI 37.79  37.16  Systolic 126  128  Diastolic 72  82  Pulse 77  83     Information is confidential and restricted. Go to Review Flowsheets to unlock data.    Physical Exam  Constitutional: No distress.  Age appropriate, hemodynamically stable.   Neck: No JVD present.  Cardiovascular: Normal rate,  regular rhythm, S1 normal, S2 normal, intact distal pulses and normal pulses. Exam reveals no gallop, no S3 and no S4.  No murmur heard. Pulmonary/Chest: Effort normal and breath sounds normal. No stridor. He has no wheezes. He has no rales.  Abdominal: Soft. Bowel sounds are normal. He exhibits no distension. There is no abdominal tenderness.  Musculoskeletal:        General: No edema.     Cervical back: Neck supple.  Neurological: He is alert and oriented to person, place, and time. He has intact cranial nerves (2-12).  Skin: Skin is warm and moist.   MRI Brain W/O Contrast: 03/17/2021 3 mm acute/early subacute lacunar infarct within the left basal ganglia.   Chronic small-vessel infarcts within the left corona radiata, left basal ganglia and right thalamus. The small-vessel infarcts within the right thalamus are from the brain MRI of 05/13/2014.  Background mild chronic small vessel ischemic disease within the cerebral white matter, progressed.   Moderate generalized cerebral atrophy, with comparatively mild cerebellar atrophy, also progressed.   Paranasal sinus disease, as described.   Trace fluid within the bilateral mastoid air cells.  CARDIAC DATABASE: EKG: December 28, 2022: Sinus rhythm, 89 bpm, nonspecific T wave normality.  No significant change compared to 09/29/2021  Echocardiogram: 04/21/2021:  Normal LV systolic function with visual EF 55-60%. Left ventricle cavity is normal in size. Normal wall thickness with basal septal hypertrophy.  Normal global wall motion. Normal diastolic filling pattern, normal LAP.  Trace tricuspid regurgitation. No evidence of pulmonary hypertension.  No prior study for comparison.   Stress Testing: Exercise nuclear stress test 04/26/2021:   Abnormal ECG stress. The patient exercised for 5 minutes and 41 seconds of a Bruce protocol, achieving approximately 4.64 METs.  Reduced exercise tolerance. Hypertensive BP response. Resting EKG  demonstrated normal sinus rhythm. No ST-T wave abnormalities. Peak EKG revealed 2 mm down sloping ST depression in the inferior leads persisted for 3 min into recovery.  Mild diaphragmatic attenuation noted in the inferior wall. Mild small sized ischemia in this region cannot be excluded.  Gated SPECT imaging of the left ventricle was normal. All segments of left ventricle demonstrated normal wall motion and thickening. No stress lung uptake. TID is normal. Stress LV EF is normal 63%.  Intermediate risk study due to abnormal EKG response, however hypertension may induce such change. No prior exam available for comparison.  Heart Catheterization: None  Carotid duplex: 03/11/2014: Trace heterogenous atherosclerotic plaque in bilateral carotid bifurcation without evidence of ICA stenosis.  Vertebral arteries are patent and antegrade flow.  Ultrasound aorta: 03/11/2014: 3.5 x 3.7 cm distal abdominal aortic aneurysm shows mild progression since 2012.  Follow-up study in 2 years.  Abdominal Aortic Duplex 04/21/2021:  Severe dilatation of the abdominal aorta is noted in the distal aorta.  Moderate dilatation of the abdominal aorta is noted in the mid aorta. An abdominal aortic aneurysm measuring 4.65 x 4.7 x 5.42 cm is seen.  Mild plaque noted in the mid and distal aorta.  Iliacs were not visualized due to body habitus.  Compared to CTA abdomen 05/13/2014:  infrarenal abdominal aortic aneurysm measures 3.5 by 3.7 cm in diameter.  This study suggests significant progression of the AAA. COnsider CTA to further evaluate or referral to vascular surgery.  VAS US Renal artery bilateral  08/22/2021 Right: No evidence of right renal artery stenosis. Normal right Resistive Index. Normal size right kidney. RRV flow present.  Left:  No evidence of left renal artery stenosis. Normal left Resistive Index. Normal size of left kidney. LRV flow present.   LABORATORY DATA:    Latest Ref Rng & Units 08/22/2021     4:40 AM 08/21/2021   10:15 AM 08/18/2021    3:18 PM  CBC  WBC 4.0 - 10.5 K/uL 12.0  7.2  7.1   Hemoglobin 13.0 - 17.0 g/dL 86.5  78.4  69.6   Hematocrit 39.0 - 52.0 % 39.2  40.7  44.2   Platelets 150 - 400 K/uL 234  223  279        Latest Ref Rng & Units 10/23/2021   10:03 AM 08/22/2021    4:40 AM 08/21/2021   12:00 PM  CMP  Glucose 70 - 99 mg/dL 99  295  284   BUN 8 - 27 mg/dL 14  13  10    Creatinine 0.76 - 1.27 mg/dL 1.32  0.97  1.03   Sodium 134 - 144 mmol/L 139  137  136   Potassium 3.5 - 5.2 mmol/L 4.7  4.7  4.1   Chloride 96 - 106 mmol/L 103  104  104   CO2 20 - 29 mmol/L 22  26  23    Calcium 8.6 - 10.2 mg/dL 9.5  8.5  8.9   Total Protein 6.0 - 8.5 g/dL 6.3     Total Bilirubin 0.0 - 1.2 mg/dL 0.3     Alkaline Phos 44 - 121 IU/L 109     AST 0 - 40 IU/L 15     ALT 0 - 44 IU/L 24       Lipid Panel     Component Value Date/Time   CHOL 129 04/18/2022 1214   TRIG 81 04/18/2022 1214   HDL 40 04/18/2022 1214   CHOLHDL 3.2 04/18/2022 1214   LDLCALC 73 04/18/2022 1214   LABVLDL 16 04/18/2022 1214    No components found for: "NTPROBNP" No results for input(s): "PROBNP" in the last 8760 hours. No results for input(s): "TSH" in the last 8760 hours.   BMP No results for input(s): "NA", "K", "CL", "CO2", "GLUCOSE", "BUN", "CREATININE", "CALCIUM", "GFRNONAA", "GFRAA" in the last 8760 hours.   HEMOGLOBIN A1C Lab Results  Component Value Date   HGBA1C 6.4 (H) 04/18/2022    IMPRESSION:    ICD-10-CM   1. Dyspnea on exertion  R06.09     2. Abdominal aortic aneurysm (AAA) without rupture, unspecified part (HCC)  I71.40 EKG 12-Lead    3. Status post endovascular aneurysm repair (EVAR)  Z98.890    Z86.79     4. Benign hypertension  I10     5. Hx of stroke  Z86.73         RECOMMENDATIONS: Lawrence Bennett is a 67 y.o. male whose past medical history and cardiac risk factors include: History of PVCs, abdominal aortic aneurysm 5.7 cm status post EVAR (08/21/2021),  history of stroke, former smoker, hypertension, hyperlipidemia, aortic atherosclerosis, obesity due to excess calories.  Dyspnea on exertion Chronic and stable. Medications reconciled. Office and home blood pressures are well-controlled. Prior echo and stress test results reviewed as part of medical decision making today. Further workup will be deferred to primary team  Abdominal aortic aneurysm 5.7 cm status post EVAR (08/21/2021) Status post EVAR. Currently on Plavix and statin therapy. He is a non-smoker.   Denies claudication. Follows up with vascular surgery on a regular basis.  Benign hypertension Office blood pressures are well-controlled Medications reconciled. Reemphasized the importance of low-salt diet.  Hx of stroke Given his prior history of bilateral CVAs and referral to cardiology for further evaluation. I discussed undergoing Holter versus loop recorder.   Last year they were very adamant about undergoing loop recorder implant after the summer was over.  However they never followed through.  Today I offered him either loop recorder implant to monitor for A-fib given his history of stroke or at least a Zio patch for 2 weeks for monitoring.  However, he would like to hold off additional testing at this time as he has remained asymptomatic and no recent strokes Reemphasized importance of secondary prevention.  Atherosclerosis of aorta (HCC) Continue antiplatelet therapy and statins  FINAL MEDICATION LIST END OF ENCOUNTER: No orders of the defined types were placed in this encounter.   There are no discontinued medications.    Current Outpatient Medications:    amLODipine (NORVASC) 10 MG tablet, Take 1 tablet (  10 mg) by mouth daily. (NEEDS TO BE SEEN BEFORE NEXT REFILL), Disp: 60 tablet, Rfl: 0   aspirin EC 81 MG tablet, Take 81 mg by mouth daily. Swallow whole., Disp: , Rfl:    atorvastatin (LIPITOR) 80 MG tablet, Take 1 tablet (80 mg total) by mouth daily., Disp:  90 tablet, Rfl: 0   benzonatate (TESSALON) 200 MG capsule, Take 1 capsule (200 mg total) by mouth 3 (three) times daily as needed., Disp: 30 capsule, Rfl: 1   cetirizine (ZYRTEC ALLERGY) 10 MG tablet, Take 1 tablet (10 mg total) by mouth daily., Disp: 90 tablet, Rfl: 1   clopidogrel (PLAVIX) 75 MG tablet, Take 1 tablet by mouth once daily., Disp: 30 tablet, Rfl: 11   divalproex (DEPAKOTE ER) 500 MG 24 hr tablet, Take 1 tablet (500 mg) by mouth at bedtime., Disp: 90 tablet, Rfl: 2   fluticasone (FLONASE) 50 MCG/ACT nasal spray, Place 2 sprays into both nostrils daily., Disp: 16 g, Rfl: 6   ketoconazole (NIZORAL) 2 % cream, Apply 1 application topically daily for 28 days., Disp: 60 g, Rfl: 0   LORazepam (ATIVAN) 0.5 MG tablet, Take 1 tablet (0.5 mg) by mouth 3 times daily., Disp: 90 tablet, Rfl: 2   losartan (COZAAR) 50 MG tablet, Take 1 tablet (50 mg total) by mouth daily., Disp: 90 tablet, Rfl: 3   PARoxetine (PAXIL) 20 MG tablet, Take 1 tablet (20 mg total) by mouth at bedtime., Disp: 90 tablet, Rfl: 2   predniSONE (DELTASONE) 20 MG tablet, Take 2 tablets (40 mg total) by mouth daily with breakfast for 5 days., Disp: 10 tablet, Rfl: 0   QUEtiapine (SEROQUEL) 50 MG tablet, Take 1 tablet (50 mg total) by mouth at bedtime., Disp: 90 tablet, Rfl: 2   tamsulosin (FLOMAX) 0.4 MG CAPS capsule, Take 1 capsule (0.4 mg total) by mouth in the morning and at bedtime., Disp: 180 capsule, Rfl: 0   traZODone (DESYREL) 50 MG tablet, Take 1 tablet (50 mg total) by mouth at bedtime., Disp: 90 tablet, Rfl: 2   venlafaxine XR (EFFEXOR-XR) 75 MG 24 hr capsule, Take 1 capsule (75 mg total) by mouth daily with breakfast., Disp: 90 capsule, Rfl: 2   vitamin C (ASCORBIC ACID) 500 MG tablet, Take 500 mg by mouth daily., Disp: , Rfl:    zinc gluconate 50 MG tablet, Take 50 mg by mouth daily., Disp: , Rfl:   Orders Placed This Encounter  Procedures   EKG 12-Lead     There are no Patient Instructions on file for this  visit.   --Continue cardiac medications as reconciled in final medication list. --Return in about 1 year (around 12/28/2023) for Annual follow up visit. Or sooner if needed. --Continue follow-up with your primary care physician regarding the management of your other chronic comorbid conditions.  Patient's questions and concerns were addressed to his satisfaction. He voices understanding of the instructions provided during this encounter.   This note was created using a voice recognition software as a result there may be grammatical errors inadvertently enclosed that do not reflect the nature of this encounter. Every attempt is made to correct such errors.  Tessa Lerner, [place], [facility]  Pager:  [phone] Office: [phone]

## 2023-01-04 ENCOUNTER — Other Ambulatory Visit: Payer: Self-pay

## 2023-01-14 ENCOUNTER — Other Ambulatory Visit (HOSPITAL_COMMUNITY): Payer: Self-pay

## 2023-01-16 ENCOUNTER — Ambulatory Visit: Payer: Medicare HMO | Admitting: Podiatry

## 2023-01-16 ENCOUNTER — Other Ambulatory Visit (HOSPITAL_COMMUNITY): Payer: Self-pay

## 2023-01-16 DIAGNOSIS — L03031 Cellulitis of right toe: Secondary | ICD-10-CM

## 2023-01-16 DIAGNOSIS — L600 Ingrowing nail: Secondary | ICD-10-CM | POA: Diagnosis not present

## 2023-01-16 MED ORDER — CEPHALEXIN 500 MG PO CAPS
500.0000 mg | ORAL_CAPSULE | Freq: Three times a day (TID) | ORAL | 0 refills | Status: AC
  Filled 2023-01-16: qty 30, 10d supply, fill #0

## 2023-01-16 NOTE — Patient Instructions (Signed)

## 2023-01-16 NOTE — Progress Notes (Signed)
Subjective:  Patient ID: Lawrence Bennett, male    DOB: [birth year],  MRN: [id number]  Lawrence Bennett presents to clinic today for:  Chief Complaint  Patient presents with   Ingrown Toenail    Pt cma ein for an ingrown nail he has had for a couple weeks more an dis causing him pain with a bit of drainage and lots of swelling and redness   Patient presents with concern of an infected ingrown toenail to the right great toe, lateral nail margin.  Notes that there has been some drainage, pain and redness to the area for the past week or so.  He recently underwent a PNA procedure by Dr. Al Corpus a few weeks ago and states that he will do very well.  PCP is Raliegh Ip, DO.  No Known Allergies  Review of Systems: Negative except as noted in the HPI.  Objective:  There were no vitals filed for this visit.  Lawrence Bennett is a pleasant 67 y.o. male in NAD. AAO x 3.  Vascular Examination: Capillary refill time is 3-5 seconds to toes bilateral. Palpable pedal pulses b/l LE. Digital hair present b/l. No pedal edema b/l. Skin temperature gradient WNL b/l. No varicosities b/l. No cyanosis or clubbing noted b/l.   Dermatological Examination: There is incurvation of the right hallux lateral nail border.  There is pain on palpation of the affected nail border.  There is localized erythema, edema, calor and pain on palpation.  There is escharotic tissue indicating recent drainage.     Latest Ref Rng & Units 04/18/2022   12:05 PM  Hemoglobin A1C  Hemoglobin-A1c 4.8 - 5.6 % 6.4    Assessment/Plan: 1. Ingrown toenail   2. Paronychia of toe of right foot     Meds ordered this encounter  Medications   cephALEXin (KEFLEX) 500 MG capsule    Sig: Take 1 capsule (500 mg total) by mouth 3 (three) times daily for 10 days.    Dispense:  30 capsule    Refill:  0   Discussed patient's condition today.  After obtaining patient consent, the right hallux was anesthetized with a 50:50 mixture  of 1% lidocaine plain and 0.5% bupivacaine plain for a total of 3cc's administered.  Upon confirmation of anesthesia, a freer elevator was utilized to free the right hallux lateral nail border from the nail bed.  The nail border was then avulsed proximal to the eponychium and removed in toto.  The area was inspected for any remaining spicules.  A chemical matrixectomy was performed with phenol and neutralized with alcohol solution.  Antibiotic ointment and a DSD were applied, followed by a Coban dressing.  Patient tolerated the anesthetic and procedure well and will f/u in 2-3 weeks for recheck.  Patient given post-procedure instructions for Epsom salt soaks, antibiotic ointment and daily use of Bandaids until toe starts to dry / form eschar.   Prescription for cephalexin 500 mg 1 tablet p.o. 3 times daily x 10 days sent to his pharmacy.  Since patient is experienced with having this type of procedure and how the area should look afterwards, we will just have him call for a progress report in the next 1 to 2 weeks to confirm he is healing well and the infection has resolved.   Clerance Lav, DPM, FACFAS [facility]     [address]  [place], [place] [address]                Office [phone]  Fax [phone]

## 2023-01-18 ENCOUNTER — Other Ambulatory Visit (HOSPITAL_COMMUNITY): Payer: Self-pay

## 2023-01-22 ENCOUNTER — Other Ambulatory Visit: Payer: Self-pay | Admitting: Family Medicine

## 2023-01-22 ENCOUNTER — Ambulatory Visit: Payer: Medicare HMO | Admitting: Family Medicine

## 2023-01-23 ENCOUNTER — Other Ambulatory Visit (HOSPITAL_COMMUNITY): Payer: Self-pay

## 2023-01-25 ENCOUNTER — Other Ambulatory Visit (HOSPITAL_COMMUNITY): Payer: Self-pay

## 2023-01-30 ENCOUNTER — Ambulatory Visit: Payer: Medicare HMO | Admitting: Podiatry

## 2023-01-30 ENCOUNTER — Encounter: Payer: Self-pay | Admitting: Podiatry

## 2023-01-30 DIAGNOSIS — L03031 Cellulitis of right toe: Secondary | ICD-10-CM

## 2023-01-30 NOTE — Progress Notes (Signed)
Subjective:   Patient ID: Lawrence Bennett, male   DOB: 67 y.o.   MRN: [id number]   HPI Patient presents concerned about redness in the right big toe after having procedure done several weeks ago   ROS      Objective:  Physical Exam  Neurovascular status intact slight redness lateral border right hallux localized low-grade drainage noted     Assessment:  Healing for ingrown toenail mild paronychia possibly present localized     Plan:  Instructed on soaks and wearing shoe gear reappoint to recheck all questions answered

## 2023-02-02 ENCOUNTER — Other Ambulatory Visit (HOSPITAL_COMMUNITY): Payer: Self-pay | Admitting: Psychiatry

## 2023-02-04 ENCOUNTER — Other Ambulatory Visit: Payer: Self-pay

## 2023-02-04 ENCOUNTER — Other Ambulatory Visit (HOSPITAL_COMMUNITY): Payer: Self-pay

## 2023-02-04 MED ORDER — LORAZEPAM 0.5 MG PO TABS
0.5000 mg | ORAL_TABLET | Freq: Three times a day (TID) | ORAL | 2 refills | Status: DC
  Filled 2023-02-04: qty 90, 30d supply, fill #0
  Filled 2023-03-04: qty 90, 30d supply, fill #1
  Filled 2023-04-01 – 2023-04-02 (×2): qty 90, 30d supply, fill #2

## 2023-02-14 ENCOUNTER — Other Ambulatory Visit: Payer: Self-pay | Admitting: Family Medicine

## 2023-02-14 ENCOUNTER — Other Ambulatory Visit (HOSPITAL_COMMUNITY): Payer: Self-pay

## 2023-02-14 DIAGNOSIS — E782 Mixed hyperlipidemia: Secondary | ICD-10-CM

## 2023-02-14 MED ORDER — ATORVASTATIN CALCIUM 80 MG PO TABS
80.0000 mg | ORAL_TABLET | Freq: Every day | ORAL | 1 refills | Status: DC
Start: 2023-02-14 — End: 2023-07-29
  Filled 2023-02-14: qty 90, 90d supply, fill #0
  Filled 2023-05-15: qty 90, 90d supply, fill #1

## 2023-02-18 ENCOUNTER — Other Ambulatory Visit: Payer: Self-pay

## 2023-02-19 ENCOUNTER — Other Ambulatory Visit (HOSPITAL_COMMUNITY): Payer: Self-pay

## 2023-02-23 ENCOUNTER — Other Ambulatory Visit: Payer: Self-pay | Admitting: Family Medicine

## 2023-02-23 DIAGNOSIS — I10 Essential (primary) hypertension: Secondary | ICD-10-CM

## 2023-02-25 ENCOUNTER — Other Ambulatory Visit (HOSPITAL_COMMUNITY): Payer: Self-pay

## 2023-02-25 ENCOUNTER — Encounter (INDEPENDENT_AMBULATORY_CARE_PROVIDER_SITE_OTHER): Payer: Medicare HMO | Admitting: Ophthalmology

## 2023-02-25 DIAGNOSIS — H43813 Vitreous degeneration, bilateral: Secondary | ICD-10-CM

## 2023-02-25 DIAGNOSIS — H2513 Age-related nuclear cataract, bilateral: Secondary | ICD-10-CM

## 2023-02-25 DIAGNOSIS — H35033 Hypertensive retinopathy, bilateral: Secondary | ICD-10-CM

## 2023-02-25 DIAGNOSIS — I10 Essential (primary) hypertension: Secondary | ICD-10-CM | POA: Diagnosis not present

## 2023-02-25 DIAGNOSIS — H35372 Puckering of macula, left eye: Secondary | ICD-10-CM | POA: Diagnosis not present

## 2023-02-25 DIAGNOSIS — H348122 Central retinal vein occlusion, left eye, stable: Secondary | ICD-10-CM | POA: Diagnosis not present

## 2023-02-25 MED ORDER — AMLODIPINE BESYLATE 10 MG PO TABS
10.0000 mg | ORAL_TABLET | Freq: Every day | ORAL | 1 refills | Status: DC
Start: 2023-02-25 — End: 2023-06-24
  Filled 2023-02-25: qty 90, 90d supply, fill #0
  Filled 2023-05-20: qty 90, 90d supply, fill #1

## 2023-02-27 ENCOUNTER — Other Ambulatory Visit (HOSPITAL_COMMUNITY): Payer: Self-pay

## 2023-03-01 ENCOUNTER — Other Ambulatory Visit (HOSPITAL_COMMUNITY): Payer: Self-pay

## 2023-03-05 ENCOUNTER — Other Ambulatory Visit: Payer: Self-pay

## 2023-03-21 NOTE — Progress Notes (Signed)
Office Note     CC:  AAA Requesting Provider:  Raliegh Ip, DO  HPI: Lawrence Bennett is a 67 y.o. (03-25-1956) male whom I called in follow-up s/p 08/21/21 EVAR for 5.7cm abdominal aortic aneurysm.  On exam today, Lawrence Bennett was doing well, accompanied by his daughter.  He has had no issues since he was seen last in clinic.  He denies abdominal pain, back pain, chest pain.  He denies symptoms of claudication, no rest pain in the feet, no tissue loss.   Over the last 6 months, Lawrence Bennett has had more difficulty with ambulation.  He continues to walk without assistance, but this is becoming harder and harder.  As noted previously, he is very hard of hearing, needing a hearing aid which he turns up as needed.  Last Bmp 2021 - no renal insufficiency.    The pt is  on a statin for cholesterol management.  The pt is not on a daily aspirin.   Other AC:  plavix The pt is on on medication for hypertension.   The pt is not diabetic.  Tobacco hx:  none  Past Medical History:  Diagnosis Date   AAA (abdominal aortic aneurysm) (HCC)    Anxiety    Atherosclerosis of aorta (HCC)    Atrial contractions, premature    Depression    Dyslipidemia    Dyspnea    Elevated glucose 11/11/2015   Hyperlipidemia    Mild hypertension    Stroke [facility])    Urinary hesitancy     Past Surgical History:  Procedure Laterality Date   ABDOMINAL AORTIC ENDOVASCULAR STENT GRAFT Bilateral 08/21/2021   Procedure: ENDOVASCULAR AORTIC STENT GRAFT REPAIR;  Surgeon: Victorino Sparrow, MD;  Location: [facility] OR;  Service: Vascular;  Laterality: Bilateral;   COLON RESECTION  2012   ULTRASOUND GUIDANCE FOR VASCULAR ACCESS Bilateral 08/21/2021   Procedure: ULTRASOUND GUIDANCE FOR VASCULAR ACCESS;  Surgeon: Victorino Sparrow, MD;  Location: [facility] OR;  Service: Vascular;  Laterality: Bilateral;    Social History   Socioeconomic History   Marital status: Married    Spouse name: Not on file   Number of children: 3   Years of  education: Not on file   Highest education level: Not on file  Occupational History   Occupation: retired  Tobacco Use   Smoking status: Former    Current packs/day: 0.00    Average packs/day: 0.2 packs/day for 19.0 years (3.8 ttl pk-yrs)    Types: Cigarettes    Start date: 29    Quit date: 2015    Years since quitting: 9.7   Smokeless tobacco: Never  Vaping Use   Vaping status: Never Used  Substance and Sexual Activity   Alcohol use: No    Alcohol/week: 0.0 standard drinks of alcohol   Drug use: No   Sexual activity: Not Currently  Other Topics Concern   Not on file  Social History Narrative   Right handed   Lives with wife   Social Determinants of Health   Financial Resource Strain: Low Risk  (04/03/2021)   Overall Financial Resource Strain (CARDIA)    Difficulty of Paying Living Expenses: Not hard at all  Food Insecurity: No Food Insecurity (04/03/2021)   Hunger Vital Sign    Worried About Running Out of Food in the Last Year: Never true    Ran Out of Food in the Last Year: Never true  Transportation Needs: No Transportation Needs (04/03/2021)   PRAPARE - Transportation  Lack of Transportation (Medical): No    Lack of Transportation (Non-Medical): No  Physical Activity: Inactive (04/03/2021)   Exercise Vital Sign    Days of Exercise per Week: 0 days    Minutes of Exercise per Session: 0 min  Stress: No Stress Concern Present (04/03/2021)   [employer] of Occupational Health - Occupational Stress Questionnaire    Feeling of Stress : Only a little  Social Connections: Socially Integrated (04/03/2021)   Social Connection and Isolation Panel [NHANES]    Frequency of Communication with Friends and Family: More than three times a week    Frequency of Social Gatherings with Friends and Family: More than three times a week    Attends Religious Services: 1 to 4 times per year    Active Member of [employer] or Organizations: Yes    Attends Banker Meetings: 1  to 4 times per year    Marital Status: Married  Catering manager Violence: Not At Risk (04/03/2021)   Humiliation, Afraid, Rape, and Kick questionnaire    Fear of Current or Ex-Partner: No    Emotionally Abused: No    Physically Abused: No    Sexually Abused: No    Family History  Problem Relation Age of Onset   COPD Mother    Stroke Father    Bipolar disorder Father    Hypertension Sister    Depression Sister    Colon cancer Neg Hx    Esophageal cancer Neg Hx    Inflammatory bowel disease Neg Hx    Liver disease Neg Hx    Rectal cancer Neg Hx    Pancreatic cancer Neg Hx    Stomach cancer Neg Hx     Current Outpatient Medications  Medication Sig Dispense Refill   amLODipine (NORVASC) 10 MG tablet Take 1 tablet (10 mg total) by mouth daily. 180 tablet 1   aspirin EC 81 MG tablet Take 81 mg by mouth daily. Swallow whole.     atorvastatin (LIPITOR) 80 MG tablet Take 1 tablet (80 mg total) by mouth daily. 90 tablet 1   benzonatate (TESSALON) 200 MG capsule Take 1 capsule (200 mg total) by mouth 3 (three) times daily as needed. 30 capsule 1   cetirizine (ZYRTEC ALLERGY) 10 MG tablet Take 1 tablet (10 mg total) by mouth daily. 90 tablet 1   clopidogrel (PLAVIX) 75 MG tablet Take 1 tablet by mouth once daily. 30 tablet 11   divalproex (DEPAKOTE ER) 500 MG 24 hr tablet Take 1 tablet (500 mg) by mouth at bedtime. 90 tablet 2   fluticasone (FLONASE) 50 MCG/ACT nasal spray Place 2 sprays into both nostrils daily. 16 g 6   ketoconazole (NIZORAL) 2 % cream Apply 1 application topically daily for 28 days. 60 g 0   LORazepam (ATIVAN) 0.5 MG tablet Take 1 tablet (0.5 mg) by mouth 3 times daily. 90 tablet 2   losartan (COZAAR) 50 MG tablet Take 1 tablet (50 mg total) by mouth daily. 90 tablet 3   PARoxetine (PAXIL) 20 MG tablet Take 1 tablet (20 mg total) by mouth at bedtime. 90 tablet 2   QUEtiapine (SEROQUEL) 50 MG tablet Take 1 tablet (50 mg total) by mouth at bedtime. 90 tablet 2    tamsulosin (FLOMAX) 0.4 MG CAPS capsule Take 1 capsule (0.4 mg total) by mouth in the morning and at bedtime. 180 capsule 0   traZODone (DESYREL) 50 MG tablet Take 1 tablet (50 mg total) by mouth at bedtime. 90 tablet  2   venlafaxine XR (EFFEXOR-XR) 75 MG 24 hr capsule Take 1 capsule (75 mg total) by mouth daily with breakfast. 90 capsule 2   vitamin C (ASCORBIC ACID) 500 MG tablet Take 500 mg by mouth daily.     zinc gluconate 50 MG tablet Take 50 mg by mouth daily.     No current facility-administered medications for this visit.    No Known Allergies   REVIEW OF SYSTEMS:   [X]  denotes positive finding, [ ]  denotes negative finding Cardiac  Comments:  Chest pain or chest pressure:    Shortness of breath upon exertion:    Short of breath when lying flat:    Irregular heart rhythm:        Vascular    Pain in calf, thigh, or hip brought on by ambulation:    Pain in feet at night that wakes you up from your sleep:     Blood clot in your veins:    Leg swelling:         Pulmonary    Oxygen at home:    Productive cough:     Wheezing:         Neurologic    Sudden weakness in arms or legs:     Sudden numbness in arms or legs:     Sudden onset of difficulty speaking or slurred speech:    Temporary loss of vision in one eye:     Problems with dizziness:         Gastrointestinal    Blood in stool:     Vomited blood:         Genitourinary    Burning when urinating:     Blood in urine:        Psychiatric    Major depression:         Hematologic    Bleeding problems:    Problems with blood clotting too easily:        Skin    Rashes or ulcers:        Constitutional    Fever or chills:      PHYSICAL EXAMINATION:  There were no vitals filed for this visit. Physical exam deferred due to phone call.    Non-Invasive Vascular Imaging:    Abdominal Aorta: The largest aortic measurement is 5.0 cm. Patent  endovascular aneurysm repair with no evidence of endoleak. The  largest  aortic diameter has decreased compared to prior exam. Previous diameter  measurement was 5.1 cm obtained on 08/10/2022.   Right: Resting right ankle-brachial index is within normal range. The  right toe-brachial index is normal.   Left: Resting left ankle-brachial index is within normal range. The left  toe-brachial index is normal.     ASSESSMENT/PLAN: Lawrence Bennett is a 67 y.o. male presenting s/p 08/21/21 EVAR for asymptomatic AAA.  Since last seen, Lawrence Bennett has been doing well.  Imaging at his last visit was reviewed demonstrating regression in sac size, no endoleak, widely patent left EVAR limb.    In the interim, the right limb was evaluated, and found to be widely patent.  My plan is for Lawrence Bennett to follow-up in 12 months with repeat EVAR duplex, ABI. I asked that he continue his current medication regimen.     Victorino Sparrow, MD [facility] [phone] Total time of patient care including pre-visit research, consultation, and documentation greater than 20 minutes

## 2023-03-22 ENCOUNTER — Ambulatory Visit (INDEPENDENT_AMBULATORY_CARE_PROVIDER_SITE_OTHER)
Admission: RE | Admit: 2023-03-22 | Discharge: 2023-03-22 | Disposition: A | Discharge status: Home / Self Care | Payer: Medicare HMO | Source: Ambulatory Visit | Attending: Vascular Surgery

## 2023-03-22 ENCOUNTER — Ambulatory Visit: Payer: Medicare HMO | Admitting: Vascular Surgery

## 2023-03-22 ENCOUNTER — Encounter: Payer: Self-pay | Admitting: Vascular Surgery

## 2023-03-22 ENCOUNTER — Other Ambulatory Visit (HOSPITAL_COMMUNITY): Payer: Self-pay

## 2023-03-22 ENCOUNTER — Ambulatory Visit (HOSPITAL_COMMUNITY)
Admission: RE | Admit: 2023-03-22 | Discharge: 2023-03-22 | Disposition: A | Discharge status: Home / Self Care | Payer: Medicare HMO | Source: Ambulatory Visit | Attending: Vascular Surgery | Admitting: Vascular Surgery

## 2023-03-22 VITALS — BP 172/78 | HR 63 | Temp 97.5°F | Resp 18 | Ht 70.0 in | Wt 263.9 lb

## 2023-03-22 DIAGNOSIS — Z9889 Other specified postprocedural states: Secondary | ICD-10-CM

## 2023-03-22 DIAGNOSIS — Z8679 Personal history of other diseases of the circulatory system: Secondary | ICD-10-CM | POA: Diagnosis not present

## 2023-03-22 DIAGNOSIS — I7143 Infrarenal abdominal aortic aneurysm, without rupture: Secondary | ICD-10-CM | POA: Insufficient documentation

## 2023-03-22 LAB — VAS US ABI WITH/WO TBI
Left ABI: 1.15
Right ABI: 1.14

## 2023-03-25 ENCOUNTER — Other Ambulatory Visit (HOSPITAL_COMMUNITY): Payer: Self-pay

## 2023-03-25 ENCOUNTER — Other Ambulatory Visit: Payer: Self-pay

## 2023-04-01 ENCOUNTER — Other Ambulatory Visit (HOSPITAL_BASED_OUTPATIENT_CLINIC_OR_DEPARTMENT_OTHER): Payer: Self-pay

## 2023-04-01 ENCOUNTER — Other Ambulatory Visit (HOSPITAL_COMMUNITY): Payer: Self-pay

## 2023-04-01 ENCOUNTER — Other Ambulatory Visit: Payer: Self-pay

## 2023-04-02 ENCOUNTER — Other Ambulatory Visit: Payer: Self-pay

## 2023-04-03 ENCOUNTER — Other Ambulatory Visit (HOSPITAL_COMMUNITY): Payer: Self-pay

## 2023-04-05 ENCOUNTER — Other Ambulatory Visit: Payer: Self-pay

## 2023-04-05 DIAGNOSIS — I7143 Infrarenal abdominal aortic aneurysm, without rupture: Secondary | ICD-10-CM

## 2023-04-11 ENCOUNTER — Other Ambulatory Visit (HOSPITAL_COMMUNITY): Payer: Self-pay

## 2023-04-23 ENCOUNTER — Other Ambulatory Visit: Payer: Self-pay

## 2023-04-25 ENCOUNTER — Other Ambulatory Visit (HOSPITAL_COMMUNITY): Payer: Self-pay

## 2023-04-29 ENCOUNTER — Other Ambulatory Visit (HOSPITAL_COMMUNITY): Payer: Self-pay | Admitting: Psychiatry

## 2023-04-29 ENCOUNTER — Other Ambulatory Visit (HOSPITAL_COMMUNITY): Payer: Self-pay

## 2023-04-29 ENCOUNTER — Other Ambulatory Visit: Payer: Self-pay | Admitting: Family Medicine

## 2023-04-29 MED ORDER — LOSARTAN POTASSIUM 50 MG PO TABS
50.0000 mg | ORAL_TABLET | Freq: Every day | ORAL | 0 refills | Status: DC
  Filled 2023-04-29: qty 90, 90d supply, fill #0

## 2023-04-29 MED ORDER — LORAZEPAM 0.5 MG PO TABS: 0.5000 mg | ORAL_TABLET | Freq: Three times a day (TID) | ORAL | 2 refills | Status: DC

## 2023-04-30 ENCOUNTER — Other Ambulatory Visit (HOSPITAL_COMMUNITY): Payer: Self-pay

## 2023-05-01 ENCOUNTER — Other Ambulatory Visit (HOSPITAL_COMMUNITY): Payer: Self-pay

## 2023-05-01 ENCOUNTER — Telehealth (HOSPITAL_COMMUNITY): Payer: Medicare HMO | Admitting: Psychiatry

## 2023-05-01 ENCOUNTER — Encounter (HOSPITAL_COMMUNITY): Payer: Self-pay | Admitting: Psychiatry

## 2023-05-01 DIAGNOSIS — F3160 Bipolar disorder, current episode mixed, unspecified: Secondary | ICD-10-CM

## 2023-05-01 MED ORDER — DIVALPROEX SODIUM ER 500 MG PO TB24
500.0000 mg | ORAL_TABLET | Freq: Every day | ORAL | 2 refills | Status: DC
  Filled 2023-05-01 – 2023-06-03 (×2): qty 90, 90d supply, fill #0
  Filled 2023-08-29: qty 90, 90d supply, fill #1

## 2023-05-01 MED ORDER — TRAZODONE HCL 50 MG PO TABS
50.0000 mg | ORAL_TABLET | Freq: Every day | ORAL | 2 refills | Status: DC
  Filled 2023-05-01 – 2023-06-18 (×2): qty 90, 90d supply, fill #0
  Filled 2023-08-29 – 2023-09-02 (×3): qty 90, 90d supply, fill #1

## 2023-05-01 MED ORDER — VENLAFAXINE HCL ER 75 MG PO CP24
75.0000 mg | ORAL_CAPSULE | Freq: Every day | ORAL | 2 refills | Status: DC
  Filled 2023-05-01 – 2023-07-22 (×2): qty 90, 90d supply, fill #0
  Filled 2023-10-22: qty 90, 90d supply, fill #1

## 2023-05-01 MED ORDER — PAROXETINE HCL 20 MG PO TABS
20.0000 mg | ORAL_TABLET | Freq: Every day | ORAL | 2 refills | Status: DC
  Filled 2023-05-01 – 2023-06-18 (×2): qty 90, 90d supply, fill #0
  Filled 2023-09-26: qty 90, 90d supply, fill #1

## 2023-05-01 MED ORDER — QUETIAPINE FUMARATE 50 MG PO TABS
50.0000 mg | ORAL_TABLET | Freq: Every day | ORAL | 2 refills | Status: DC
  Filled 2023-05-01 – 2023-05-17 (×2): qty 90, 90d supply, fill #0
  Filled 2023-08-12: qty 90, 90d supply, fill #1

## 2023-05-01 MED ORDER — LORAZEPAM 0.5 MG PO TABS
0.5000 mg | ORAL_TABLET | Freq: Three times a day (TID) | ORAL | 2 refills | Status: DC
  Filled 2023-05-01: qty 90, 30d supply, fill #0
  Filled 2023-06-07: qty 90, 30d supply, fill #1
  Filled 2023-07-05: qty 90, 30d supply, fill #2

## 2023-05-01 NOTE — Progress Notes (Signed)
Virtual Visit via Telephone Note  I connected with Lawrence Bennett on 05/01/23 at  2:00 PM EDT by telephone and verified that I am speaking with the correct person using two identifiers.  Location: Patient: home Provider: office   I discussed the limitations, risks, security and privacy concerns of performing an evaluation and management service by telephone and the availability of in person appointments. I also discussed with the patient that there may be a patient responsible charge related to this service. The patient expressed understanding and agreed to proceed.       I discussed the assessment and treatment plan with the patient. The patient was provided an opportunity to ask questions and all were answered. The patient agreed with the plan and demonstrated an understanding of the instructions.   The patient was advised to call back or seek an in-person evaluation if the symptoms worsen or if the condition fails to improve as anticipated.  I provided 15 minutes of non-face-to-face time during this encounter.   Diannia Ruder, MD  [facility] MD/PA/NP OP Progress Note  05/01/2023 2:13 PM Lawrence Bennett  MRN:  [id number]  Chief Complaint:  Chief Complaint  Patient presents with   Anxiety   Depression   Follow-up   HPI: This patient is a 67 year old married white male who lives with his wife and 3 children in [place]. 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  He continues to do well by his report.  He is staying very active selling things on Facebook and doing things around his house.  For the most part his health issues are under control and he is closely followed by cardiology and family medicine.  His A1c has gone up a bit but he is trying to watch his food.  In terms of mood he is doing well and seems to be in good spirits.  He denies depression anxiety  thoughts of self-harm suicide auditory and visual hallucinations or paranoia.  He is sleeping well.  His most recent Depakote level was 75 which is in a good range Visit Diagnosis:    ICD-10-CM   1. Bipolar I disorder, most recent episode mixed (HCC)  F31.60       Past Psychiatric History: Hospitalized in the geriatric psychiatry unit in 2015 for severe depression  Past Medical History:  Past Medical History:  Diagnosis Date   AAA (abdominal aortic aneurysm) (HCC)    Anxiety    Atherosclerosis of aorta (HCC)    Atrial contractions, premature    Depression    Dyslipidemia    Dyspnea    Elevated glucose 11/11/2015   Hyperlipidemia    Mild hypertension    Stroke [facility])    Urinary hesitancy     Past Surgical History:  Procedure Laterality Date   ABDOMINAL AORTIC ENDOVASCULAR STENT GRAFT Bilateral 08/21/2021   Procedure: ENDOVASCULAR AORTIC STENT GRAFT REPAIR;  Surgeon: Victorino Sparrow, MD;  Location: [facility] OR;  Service: Vascular;  Laterality: Bilateral;   COLON RESECTION  2012   ULTRASOUND GUIDANCE FOR VASCULAR ACCESS Bilateral 08/21/2021   Procedure: ULTRASOUND GUIDANCE FOR VASCULAR ACCESS;  Surgeon: Victorino Sparrow, MD;  Location: [facility] OR;  Service: Vascular;  Laterality: Bilateral;    Family Psychiatric History: See below  Family History:  Family History  Problem Relation Age of Onset   COPD Mother    Stroke Father  Bipolar disorder Father    Hypertension Sister    Depression Sister    Colon cancer Neg Hx    Esophageal cancer Neg Hx    Inflammatory bowel disease Neg Hx    Liver disease Neg Hx    Rectal cancer Neg Hx    Pancreatic cancer Neg Hx    Stomach cancer Neg Hx     Social History:  Social History   Socioeconomic History   Marital status: Married    Spouse name: Not on file   Number of children: 3   Years of education: Not on file   Highest education level: Not on file  Occupational History   Occupation: retired  Tobacco Use   Smoking status: Former     Current packs/day: 0.00    Average packs/day: 0.2 packs/day for 19.0 years (3.8 ttl pk-yrs)    Types: Cigarettes    Start date: 7    Quit date: 2015    Years since quitting: 9.8   Smokeless tobacco: Never  Vaping Use   Vaping status: Never Used  Substance and Sexual Activity   Alcohol use: No    Alcohol/week: 0.0 standard drinks of alcohol   Drug use: No   Sexual activity: Not Currently  Other Topics Concern   Not on file  Social History Narrative   Right handed   Lives with wife   Social Determinants of Health   Financial Resource Strain: Low Risk  (04/03/2021)   Overall Financial Resource Strain (CARDIA)    Difficulty of Paying Living Expenses: Not hard at all  Food Insecurity: No Food Insecurity (04/03/2021)   Hunger Vital Sign    Worried About Running Out of Food in the Last Year: Never true    Ran Out of Food in the Last Year: Never true  Transportation Needs: No Transportation Needs (04/03/2021)   PRAPARE - Administrator, Civil Service (Medical): No    Lack of Transportation (Non-Medical): No  Physical Activity: Inactive (04/03/2021)   Exercise Vital Sign    Days of Exercise per Week: 0 days    Minutes of Exercise per Session: 0 min  Stress: No Stress Concern Present (04/03/2021)   [employer] of Occupational Health - Occupational Stress Questionnaire    Feeling of Stress : Only a little  Social Connections: Socially Integrated (04/03/2021)   Social Connection and Isolation Panel [NHANES]    Frequency of Communication with Friends and Family: More than three times a week    Frequency of Social Gatherings with Friends and Family: More than three times a week    Attends Religious Services: 1 to 4 times per year    Active Member of Clubs or Organizations: Yes    Attends Banker Meetings: 1 to 4 times per year    Marital Status: Married    Allergies: No Known Allergies  Metabolic Disorder Labs: Lab Results  Component Value Date    HGBA1C 6.4 (H) 04/18/2022   No results found for: "PROLACTIN" Lab Results  Component Value Date   CHOL 129 04/18/2022   TRIG 81 04/18/2022   HDL 40 04/18/2022   CHOLHDL 3.2 04/18/2022   LDLCALC 73 04/18/2022   LDLCALC 93 10/23/2021   Lab Results  Component Value Date   TSH 3.040 10/23/2021   TSH 4.420 03/10/2021    Therapeutic Level Labs: No results found for: "LITHIUM" Lab Results  Component Value Date   VALPROATE 65.3 11/21/2022   VALPROATE 41.8 (L) 11/29/2016  No results found for: "CBMZ"  Current Medications: Current Outpatient Medications  Medication Sig Dispense Refill   amLODipine (NORVASC) 10 MG tablet Take 1 tablet (10 mg total) by mouth daily. 180 tablet 1   aspirin EC 81 MG tablet Take 81 mg by mouth daily. Swallow whole.     atorvastatin (LIPITOR) 80 MG tablet Take 1 tablet (80 mg total) by mouth daily. 90 tablet 1   benzonatate (TESSALON) 200 MG capsule Take 1 capsule (200 mg total) by mouth 3 (three) times daily as needed. 30 capsule 1   cetirizine (ZYRTEC ALLERGY) 10 MG tablet Take 1 tablet (10 mg total) by mouth daily. 90 tablet 1   clopidogrel (PLAVIX) 75 MG tablet Take 1 tablet by mouth once daily. 30 tablet 11   divalproex (DEPAKOTE ER) 500 MG 24 hr tablet Take 1 tablet (500 mg) by mouth at bedtime. 90 tablet 2   fluticasone (FLONASE) 50 MCG/ACT nasal spray Place 2 sprays into both nostrils daily. 16 g 6   ketoconazole (NIZORAL) 2 % cream Apply 1 application topically daily for 28 days. 60 g 0   LORazepam (ATIVAN) 0.5 MG tablet Take 1 tablet (0.5 mg) by mouth 3 times daily. 90 tablet 2   losartan (COZAAR) 50 MG tablet Take 1 tablet (50 mg total) by mouth daily. **NEEDS TO BE SEEN BEFORE NEXT REFILL** 90 tablet 0   PARoxetine (PAXIL) 20 MG tablet Take 1 tablet (20 mg total) by mouth at bedtime. 90 tablet 2   QUEtiapine (SEROQUEL) 50 MG tablet Take 1 tablet (50 mg total) by mouth at bedtime. 90 tablet 2   tamsulosin (FLOMAX) 0.4 MG CAPS capsule Take 1  capsule (0.4 mg total) by mouth in the morning and at bedtime. 180 capsule 0   traZODone (DESYREL) 50 MG tablet Take 1 tablet (50 mg total) by mouth at bedtime. 90 tablet 2   venlafaxine XR (EFFEXOR-XR) 75 MG 24 hr capsule Take 1 capsule (75 mg total) by mouth daily with breakfast. 90 capsule 2   vitamin C (ASCORBIC ACID) 500 MG tablet Take 500 mg by mouth daily.     zinc gluconate 50 MG tablet Take 50 mg by mouth daily.     No current facility-administered medications for this visit.     Musculoskeletal: Strength & Muscle Tone: na Gait & Station: na Patient leans: na  Psychiatric Specialty Exam: Review of Systems  HENT:  Positive for hearing loss.   All other systems reviewed and are negative.   There were no vitals taken for this visit.There is no height or weight on file to calculate BMI.  General Appearance: NA  Eye Contact:  NA  Speech:  Clear and Coherent  Volume:  Normal  Mood:  Euthymic  Affect:  Congruent  Thought Process:  Goal Directed  Orientation:  Full (Time, Place, and Person)  Thought Content: WDL   Suicidal Thoughts:  No  Homicidal Thoughts:  No  Memory:  Immediate;   Good Recent;   Good Remote;   NA  Judgement:  Good  Insight:  Fair  Psychomotor Activity:  Normal  Concentration:  Concentration: Good and Attention Span: Good  Recall:  Good  Fund of Knowledge: Good  Language: Good  Akathisia:  No  Handed:  Right  AIMS (if indicated): not done  Assets:  Communication Skills Desire for Improvement Resilience Social Support Talents/Skills  ADL's:  Intact  Cognition: WNL  Sleep:  Good   Screenings: GAD-7    Garment/textile technologist Visit  from 10/31/2022 in [facility] Visit from 07/31/2022 in [facility] Office Visit from 05/15/2022 in [facility] Visit from 10/12/2021 in [facility] Visit  from 04/11/2021 in [facility]  Total GAD-7 Score 0 0 0 0 0      PHQ2-9    Flowsheet Row Office Visit from 10/31/2022 in [facility] [place] Office Visit from 07/31/2022 in [facility] Office Visit from 05/15/2022 in [facility] Video Visit from 04/12/2022 in [facility] [place] Clinical Support from 04/04/2022 in [facility]  PHQ-2 Total Score 0 0 0 0 0  PHQ-9 Total Score 0 0 -- -- --      Flowsheet Row Pre-Admission Testing 59 from 08/18/2021 in [facility] PREADMISSION TESTING Video Visit from 02/13/2021 in [facility] at [place]  C-SSRS RISK CATEGORY No Risk No Risk        Assessment and Plan: This patient is a 68 year old male with a history of severe depression requiring ECT in the past.  However he is doing well on his current regimen.  He will continue lorazepam 0.5 mg 3 times daily for anxiety, Paxil 20 mg at bedtime for depression, Seroquel 50 mg at bedtime for mood stabilization, trazodone 50 mg at bedtime for sleep, Effexor XR 75 mg daily for depression and Depakote ER 500 mg at bedtime for mood stabilization.  He will return to see me in 6 months  Collaboration of Care: Collaboration of Care: Primary Care Provider AEB notes are shared with PCP on the epic system  Patient/Guardian was advised Release of Information must be obtained prior to any record release in order to collaborate their care with an outside provider. Patient/Guardian was advised if they have not already done so to contact the registration department to sign all necessary forms in order for [place] to release information regarding their care.   Consent: Patient/Guardian gives verbal consent for treatment and assignment of benefits for services provided during this  visit. Patient/Guardian expressed understanding and agreed to proceed.    Diannia Ruder, MD 05/01/2023, 2:13 PM

## 2023-05-02 ENCOUNTER — Other Ambulatory Visit (HOSPITAL_COMMUNITY): Payer: Self-pay

## 2023-05-14 ENCOUNTER — Other Ambulatory Visit (HOSPITAL_COMMUNITY): Payer: Self-pay

## 2023-05-17 ENCOUNTER — Other Ambulatory Visit (HOSPITAL_COMMUNITY): Payer: Self-pay

## 2023-05-22 ENCOUNTER — Ambulatory Visit (INDEPENDENT_AMBULATORY_CARE_PROVIDER_SITE_OTHER): Payer: Medicare HMO

## 2023-05-22 VITALS — BP 151/86 | Ht 70.0 in | Wt 265.0 lb

## 2023-05-22 DIAGNOSIS — Z0000 Encounter for general adult medical examination without abnormal findings: Secondary | ICD-10-CM | POA: Diagnosis not present

## 2023-05-22 NOTE — Patient Instructions (Signed)
[name] , Thank you for taking time to come for your Medicare Wellness Visit. I appreciate your ongoing commitment to your health goals. Please review the following plan we discussed and let me know if I can assist you in the future.   Referrals/Orders/Follow-Ups/Clinician Recommendations:  Next Medicare Annual Wellness Visit: May 22, 2024 at 1:10 pm virtual visit  Get your flu vaccine at your preferred pharmacy or [facility]  This is a list of the screening recommended for you and due dates:  Health Maintenance  Topic Date Due   Flu Shot  01/31/2023   COVID-19 Vaccine (4 - 2023-24 season) 03/03/2023   Cologuard (Stool DNA test)  04/13/2024   Medicare Annual Wellness Visit  05/21/2024   DTaP/Tdap/Td vaccine (2 - Td or Tdap) 04/23/2028   Pneumonia Vaccine  Completed   Hepatitis C Screening  Completed   Zoster (Shingles) Vaccine  Completed   HPV Vaccine  Aged Out   Colon Cancer Screening  Discontinued    Advanced directives: (Declined) Advance directive discussed with you today. Even though you declined this today, please call our office should you change your mind, and we can give you the proper paperwork for you to fill out.  Next Medicare Annual Wellness Visit scheduled for next year: Yes  Preventive Care 28 Years and Older, Male Preventive care refers to lifestyle choices and visits with your health care provider that can promote health and wellness. Preventive care visits are also called wellness exams. What can I expect for my preventive care visit? Counseling During your preventive care visit, your health care provider may ask about your: Medical history, including: Past medical problems. Family medical history. History of falls. Current health, including: Emotional well-being. Home life and relationship well-being. Sexual activity. Memory and ability to understand (cognition). Lifestyle, including: Alcohol, nicotine or tobacco, and drug use. Access to  firearms. Diet, exercise, and sleep habits. Work and work Astronomer. Sunscreen use. Safety issues such as seatbelt and bike helmet use. Physical exam Your health care provider will check your: Height and weight. These may be used to calculate your BMI (body mass index). BMI is a measurement that tells if you are at a healthy weight. Waist circumference. This measures the distance around your waistline. This measurement also tells if you are at a healthy weight and may help predict your risk of certain diseases, such as type 2 diabetes and high blood pressure. Heart rate and blood pressure. Body temperature. Skin for abnormal spots. What immunizations do I need?  Vaccines are usually given at various ages, according to a schedule. Your health care provider will recommend vaccines for you based on your age, medical history, and lifestyle or other factors, such as travel or where you work. What tests do I need? Screening Your health care provider may recommend screening tests for certain conditions. This may include: Lipid and cholesterol levels. Diabetes screening. This is done by checking your blood sugar (glucose) after you have not eaten for a while (fasting). Hepatitis C test. Hepatitis B test. HIV (human immunodeficiency virus) test. STI (sexually transmitted infection) testing, if you are at risk. Lung cancer screening. Colorectal cancer screening. Prostate cancer screening. Abdominal aortic aneurysm (AAA) screening. You may need this if you are a current or former smoker. Talk with your health care provider about your test results, treatment options, and if necessary, the need for more tests. Follow these instructions at home: Eating and drinking  Eat a diet that includes fresh fruits and vegetables, whole grains,  lean protein, and low-fat dairy products. Limit your intake of foods with high amounts of sugar, saturated fats, and salt. Take vitamin and mineral supplements as  recommended by your health care provider. Do not drink alcohol if your health care provider tells you not to drink. If you drink alcohol: Limit how much you have to 0-2 drinks a day. Know how much alcohol is in your drink. In the [place] one drink equals one 12 oz bottle of beer (355 mL), one 5 oz glass of wine (148 mL), or one 1 oz glass of hard liquor (44 mL). Lifestyle Brush your teeth every morning and night with fluoride toothpaste. Floss one time each day. Exercise for at least 30 minutes 5 or more days each week. Do not use any products that contain nicotine or tobacco. These products include cigarettes, chewing tobacco, and vaping devices, such as e-cigarettes. If you need help quitting, ask your health care provider. Do not use drugs. If you are sexually active, practice safe sex. Use a condom or other form of protection to prevent STIs. Take aspirin only as told by your health care provider. Make sure that you understand how much to take and what form to take. Work with your health care provider to find out whether it is safe and beneficial for you to take aspirin daily. Ask your health care provider if you need to take a cholesterol-lowering medicine (statin). Find healthy ways to manage stress, such as: Meditation, yoga, or listening to music. Journaling. Talking to a trusted person. Spending time with friends and family. Safety Always wear your seat belt while driving or riding in a vehicle. Do not drive: If you have been drinking alcohol. Do not ride with someone who has been drinking. When you are tired or distracted. While texting. If you have been using any mind-altering substances or drugs. Wear a helmet and other protective equipment during sports activities. If you have firearms in your house, make sure you follow all gun safety procedures. Minimize exposure to UV radiation to reduce your risk of skin cancer. What's next? Visit your health care provider once a year for  an annual wellness visit. Ask your health care provider how often you should have your eyes and teeth checked. Stay up to date on all vaccines. This information is not intended to replace advice given to you by your health care provider. Make sure you discuss any questions you have with your health care provider. Document Revised: 12/14/2020 Document Reviewed: 12/14/2020 Elsevier Patient Education  2024 [employer]. Understanding Your Risk for Falls Millions of people have serious injuries from falls each year. It is important to understand your risk of falling. Talk with your health care provider about your risk and what you can do to lower it. If you do have a serious fall, make sure to tell your provider. Falling once raises your risk of falling again. How can falls affect me? Serious injuries from falls are common. These include: Broken bones, such as hip fractures. Head injuries, such as traumatic brain injuries (TBI) or concussions. A fear of falling can cause you to avoid activities and stay at home. This can make your muscles weaker and raise your risk for a fall. What can increase my risk? There are a number of risk factors that increase your risk for falling. The more risk factors you have, the higher your risk of falling. Serious injuries from a fall happen most often to people who are older than 68 years old.  Teenagers and young adults ages 23-29 are also at higher risk. Common risk factors include: Weakness in the lower body. Being generally weak or confused due to long-term (chronic) illness. Dizziness or balance problems. Poor vision. Medicines that cause dizziness or drowsiness. These may include: Medicines for your blood pressure, heart, anxiety, insomnia, or swelling (edema). Pain medicines. Muscle relaxants. Other risk factors include: Drinking alcohol. Having had a fall in the past. Having foot pain or wearing improper footwear. Working at a dangerous job. Having  any of the following in your home: Tripping hazards, such as floor clutter or loose rugs. Poor lighting. Pets. Having dementia or memory loss. What actions can I take to lower my risk of falling?     Physical activity Stay physically fit. Do strength and balance exercises. Consider taking a regular class to build strength and balance. Yoga and tai chi are good options. Vision Have your eyes checked every year and your prescription for glasses or contacts updated as needed. Shoes and walking aids Wear non-skid shoes. Wear shoes that have rubber soles and low heels. Do not wear high heels. Do not walk around the house in socks or slippers. Use a cane or walker as told by your provider. Home safety Attach secure railings on both sides of your stairs. Install grab bars for your bathtub, shower, and toilet. Use a non-skid mat in your bathtub or shower. Attach bath mats securely with double-sided, non-slip rug tape. Use good lighting in all rooms. Keep a flashlight near your bed. Make sure there is a clear path from your bed to the bathroom. Use night-lights. Do not use throw rugs. Make sure all carpeting is taped or tacked down securely. Remove all clutter from walkways and stairways, including extension cords. Repair uneven or broken steps and floors. Avoid walking on icy or slippery surfaces. Walk on the grass instead of on icy or slick sidewalks. Use ice melter to get rid of ice on walkways in the winter. Use a cordless phone. Questions to ask your health care provider Can you help me check my risk for a fall? Do any of my medicines make me more likely to fall? Should I take a vitamin D supplement? What exercises can I do to improve my strength and balance? Should I make an appointment to have my vision checked? Do I need a bone density test to check for weak bones (osteoporosis)? Would it help to use a cane or a walker? Where to find more information Centers for Disease Control  and Prevention, [employer]: TonerPromos.no Community-Based Fall Prevention Programs: TonerPromos.no [employer] on Aging: BaseRingTones.pl Contact a health care provider if: You fall at home. You are afraid of falling at home. You feel weak, drowsy, or dizzy. This information is not intended to replace advice given to you by your health care provider. Make sure you discuss any questions you have with your health care provider. Document Revised: 02/19/2022 Document Reviewed: 02/19/2022 Elsevier Patient Education  2024 [employer].

## 2023-05-22 NOTE — Progress Notes (Signed)
 Because this visit was a virtual/telehealth visit,  certain criteria was not obtained, such a blood pressure, CBG if applicable, and timed get up and go. Any medications not marked as "taking" were not mentioned during the medication reconciliation part of the visit. Any vitals not documented were not able to be obtained due to this being a telehealth visit or patient was unable to self-report a recent blood pressure reading due to a lack of equipment at home via telehealth. Vitals that have been documented are verbally provided by the patient.   Subjective:   Lawrence Bennett is a 67 y.o. male who presents for Medicare Annual/Subsequent preventive examination.  Visit Complete: Virtual I connected with  [name] on 05/22/23 by a audio enabled telemedicine application and verified that I am speaking with the correct person using two identifiers.  Patient Location: Home  Provider Location: Home Office  I discussed the limitations of evaluation and management by telemedicine. The patient expressed understanding and agreed to proceed.  Vital Signs: Because this visit was a virtual/telehealth visit, some criteria may be missing or patient reported. Any vitals not documented were not able to be obtained and vitals that have been documented are patient reported.  Patient Medicare AWV questionnaire was completed by the patient on na; I have confirmed that all information answered by patient is correct and no changes since this date.  Cardiac Risk Factors include: advanced age (>22men, >65 women);sedentary lifestyle;obesity (BMI >30kg/m2);dyslipidemia;hypertension;male gender;Other (see comment), Risk factor comments: history of stroke     Objective:    Today's Vitals   05/22/23 1508  BP: (!) 151/86  Weight: 265 lb (120.2 kg)  Height: 5\' 10"  (1.778 m)   Body mass index is 38.02 kg/m.     05/22/2023    3:18 PM 06/12/2022    2:31 PM 04/04/2022    1:17 PM 10/03/2021    3:29 PM  08/18/2021    3:01 PM 04/03/2021    1:26 PM 03/21/2021    2:02 PM  Advanced Directives  Does Patient Have a Medical Advance Directive? No No No No No No No  Would patient like information on creating a medical advance directive? No - Patient declined  No - Patient declined   No - Patient declined     Current Medications (verified) Outpatient Encounter Medications as of 05/22/2023  Medication Sig   amLODipine (NORVASC) 10 MG tablet Take 1 tablet (10 mg total) by mouth daily.   aspirin EC 81 MG tablet Take 81 mg by mouth daily. Swallow whole.   atorvastatin (LIPITOR) 80 MG tablet Take 1 tablet (80 mg total) by mouth daily.   clopidogrel (PLAVIX) 75 MG tablet Take 1 tablet by mouth once daily.   divalproex (DEPAKOTE ER) 500 MG 24 hr tablet Take 1 tablet (500 mg) by mouth at bedtime.   ketoconazole (NIZORAL) 2 % cream Apply 1 application topically daily for 28 days.   LORazepam (ATIVAN) 0.5 MG tablet Take 1 tablet (0.5 mg) by mouth 3 times daily.   losartan (COZAAR) 50 MG tablet Take 1 tablet (50 mg total) by mouth daily. **NEEDS TO BE SEEN BEFORE NEXT REFILL**   PARoxetine (PAXIL) 20 MG tablet Take 1 tablet (20 mg total) by mouth at bedtime.   QUEtiapine (SEROQUEL) 50 MG tablet Take 1 tablet (50 mg total) by mouth at bedtime.   tamsulosin (FLOMAX) 0.4 MG CAPS capsule Take 1 capsule (0.4 mg total) by mouth in the morning and at bedtime.   traZODone (DESYREL)  50 MG tablet Take 1 tablet (50 mg total) by mouth at bedtime.   venlafaxine XR (EFFEXOR-XR) 75 MG 24 hr capsule Take 1 capsule (75 mg total) by mouth daily with breakfast.   vitamin C (ASCORBIC ACID) 500 MG tablet Take 500 mg by mouth daily.   zinc gluconate 50 MG tablet Take 50 mg by mouth daily.   cetirizine (ZYRTEC ALLERGY) 10 MG tablet Take 1 tablet (10 mg total) by mouth daily.   fluticasone (FLONASE) 50 MCG/ACT nasal spray Place 2 sprays into both nostrils daily.   [DISCONTINUED] benzonatate (TESSALON) 200 MG capsule Take 1 capsule  (200 mg total) by mouth 3 (three) times daily as needed.   No facility-administered encounter medications on file as of 05/22/2023.    Allergies (verified) Patient has no known allergies.   History: Past Medical History:  Diagnosis Date   AAA (abdominal aortic aneurysm) (HCC)    Anxiety    Atherosclerosis of aorta (HCC)    Atrial contractions, premature    Depression    Dyslipidemia    Dyspnea    Elevated glucose 11/11/2015   Hyperlipidemia    Mild hypertension    Stroke [facility])    Urinary hesitancy    Past Surgical History:  Procedure Laterality Date   ABDOMINAL AORTIC ENDOVASCULAR STENT GRAFT Bilateral 08/21/2021   Procedure: ENDOVASCULAR AORTIC STENT GRAFT REPAIR;  Surgeon: Victorino Sparrow, MD;  Location: [facility] OR;  Service: Vascular;  Laterality: Bilateral;   COLON RESECTION  2012   ULTRASOUND GUIDANCE FOR VASCULAR ACCESS Bilateral 08/21/2021   Procedure: ULTRASOUND GUIDANCE FOR VASCULAR ACCESS;  Surgeon: Victorino Sparrow, MD;  Location: [facility] OR;  Service: Vascular;  Laterality: Bilateral;   Family History  Problem Relation Age of Onset   COPD Mother    Stroke Father    Bipolar disorder Father    Hypertension Sister    Depression Sister    Colon cancer Neg Hx    Esophageal cancer Neg Hx    Inflammatory bowel disease Neg Hx    Liver disease Neg Hx    Rectal cancer Neg Hx    Pancreatic cancer Neg Hx    Stomach cancer Neg Hx    Social History   Socioeconomic History   Marital status: Married    Spouse name: Not on file   Number of children: 3   Years of education: Not on file   Highest education level: Not on file  Occupational History   Occupation: retired  Tobacco Use   Smoking status: Former    Current packs/day: 0.00    Average packs/day: 0.2 packs/day for 19.0 years (3.8 ttl pk-yrs)    Types: Cigarettes    Start date: 44    Quit date: 2015    Years since quitting: 9.8   Smokeless tobacco: Never  Vaping Use   Vaping status: Never Used  Substance and  Sexual Activity   Alcohol use: No    Alcohol/week: 0.0 standard drinks of alcohol   Drug use: No   Sexual activity: Not Currently  Other Topics Concern   Not on file  Social History Narrative   Right handed   Lives with wife   Social Determinants of Health   Financial Resource Strain: Low Risk  (05/22/2023)   Overall Financial Resource Strain (CARDIA)    Difficulty of Paying Living Expenses: Not hard at all  Food Insecurity: No Food Insecurity (05/22/2023)   Hunger Vital Sign    Worried About Running Out of Food in the  Last Year: Never true    Ran Out of Food in the Last Year: Never true  Transportation Needs: No Transportation Needs (05/22/2023)   PRAPARE - Administrator, Civil Service (Medical): No    Lack of Transportation (Non-Medical): No  Physical Activity: Insufficiently Active (05/22/2023)   Exercise Vital Sign    Days of Exercise per Week: 7 days    Minutes of Exercise per Session: 20 min  Stress: No Stress Concern Present (05/22/2023)   [employer] of Occupational Health - Occupational Stress Questionnaire    Feeling of Stress : Not at all  Social Connections: Moderately Integrated (05/22/2023)   Social Connection and Isolation Panel [NHANES]    Frequency of Communication with Friends and Family: More than three times a week    Frequency of Social Gatherings with Friends and Family: More than three times a week    Attends Religious Services: More than 4 times per year    Active Member of [employer] or Organizations: No    Attends Engineer, structural: Never    Marital Status: Married    Tobacco Counseling Counseling given: Yes   Clinical Intake:  Pre-visit preparation completed: Yes  Pain : No/denies pain     BMI - recorded: 38.02 Nutritional Status: BMI > 30  Obese Nutritional Risks: None Diabetes: No  How often do you need to have someone help you when you read instructions, pamphlets, or other written materials from your  doctor or pharmacy?: 1 - Never  Interpreter Needed?: No  Information entered by ::  W, CMA   Activities of Daily Living    05/22/2023    3:12 PM  In your present state of health, do you have any difficulty performing the following activities:  Hearing? 1  Comment bilateral hearing loss. wears a sound amplifier to help  Vision? 0  Comment sees [facility] [employer] and Dr. Ashley Royalty  Difficulty concentrating or making decisions? 0  Walking or climbing stairs? 0  Dressing or bathing? 0  Doing errands, shopping? 0  Preparing Food and eating ? N  Using the Toilet? N  In the past six months, have you accidently leaked urine? N  Do you have problems with loss of bowel control? N  Managing your Medications? N  Managing your Finances? N  Housekeeping or managing your Housekeeping? N    Patient Care Team: Raliegh Ip, DO as PCP - General (Family Medicine) Myrlene Broker, MD as Consulting Physician [facility]) Van Clines, MD as Consulting Physician (Neurology) Nigel Sloop, MD as Referring Physician (Otolaryngology)  Indicate any recent Medical Services you may have received from other than [facility] providers in the past year (date may be approximate).     Assessment:   This is a routine wellness examination for Cincere.  Hearing/Vision screen Hearing Screening - Comments:: Patient c/o hearing difficulty bilaterally. Declines referral to audiology Vision Screening - Comments:: Wears rx glasses - up to date with routine eye exams  Sees Dr. Odis Hollingshead and Dr. Ashley Royalty   Goals Addressed             This Visit's Progress    Patient Stated       Lose weight       Depression Screen    05/22/2023    3:20 PM 10/31/2022    1:02 PM 07/31/2022    2:06 PM 05/15/2022    3:00 PM 04/12/2022   10:00 AM 04/04/2022    1:30 PM 10/12/2021  1:04 PM  PHQ 2/9 Scores  PHQ - 2 Score 0  0 0  0 0  PHQ- 9 Score 0  0         Information is confidential and restricted. Go to  Review Flowsheets to unlock data.    Fall Risk    05/22/2023    3:19 PM 07/31/2022    2:06 PM 06/12/2022    2:30 PM 05/15/2022    3:00 PM 04/04/2022    1:29 PM  Fall Risk   Falls in the past year? 1 0 0 0 0  Number falls in past yr: 0  0    Injury with Fall? 0  0    Risk for fall due to : History of fall(s)  Impaired balance/gait    Follow up Education provided;Falls prevention discussed  Falls evaluation completed  Falls evaluation completed    MEDICARE RISK AT HOME: Medicare Risk at Home Any stairs in or around the home?: No If so, are there any without handrails?: No Home free of loose throw rugs in walkways, pet beds, electrical cords, etc?: Yes Adequate lighting in your home to reduce risk of falls?: Yes Life alert?: No Use of a cane, walker or w/c?: No Grab bars in the bathroom?: Yes (patient has a "handicapped bathroom") Shower chair or bench in shower?: Yes Elevated toilet seat or a handicapped toilet?: Yes  TIMED UP AND GO:  Was the test performed?  No    Cognitive Function:        05/22/2023    3:20 PM 04/04/2022    1:25 PM 04/03/2021    1:29 PM 04/01/2020    2:04 PM  6CIT Screen  What Year? 0 points 0 points 0 points 0 points  What month? 0 points 0 points 0 points 0 points  What time? 0 points 0 points 0 points 0 points  Count back from 20 0 points 0 points 0 points 0 points  Months in reverse 0 points 0 points 2 points 0 points  Repeat phrase 0 points 0 points 0 points 0 points  Total Score 0 points 0 points 2 points 0 points    Immunizations Immunization History  Administered Date(s) Administered   Fluad Quad(high Dose 65+) 06/10/2020, 04/11/2021, 04/17/2022   Influenza Inj Mdck Quad Pf 03/24/2018   Influenza,inj,Quad PF,6+ Mos 03/23/2016, 03/26/2017, 03/26/2019   PFIZER Comirnaty(Gray Top)Covid-19 Tri-Sucrose Vaccine 11/12/2019, 12/03/2019, 06/20/2020   Pneumococcal Conjugate-13 05/26/2018   Pneumococcal Polysaccharide-23 10/07/2019   Tdap  04/23/2018   Zoster Recombinant(Shingrix) 04/11/2021, 10/12/2021    TDAP status: Up to date  Flu Vaccine status: Due, Education has been provided regarding the importance of this vaccine. Advised may receive this vaccine at local pharmacy or Health Dept. Aware to provide a copy of the vaccination record if obtained from local pharmacy or Health Dept. Verbalized acceptance and understanding.  Pneumococcal vaccine status: Up to date  Covid-19 vaccine status: Declined, Education has been provided regarding the importance of this vaccine but patient still declined. Advised may receive this vaccine at local pharmacy or Health Dept.or vaccine clinic. Aware to provide a copy of the vaccination record if obtained from local pharmacy or Health Dept. Verbalized acceptance and understanding.  Qualifies for Shingles Vaccine? No   Zostavax completed No   Shingrix Completed?: Yes  Screening Tests Health Maintenance  Topic Date Due   Colonoscopy  04/04/2020   INFLUENZA VACCINE  01/31/2023   COVID-19 Vaccine (4 - 2023-24 season) 03/03/2023   Medicare Annual Wellness (  AWV)  04/05/2023   DTaP/Tdap/Td (2 - Td or Tdap) 04/23/2028   Pneumonia Vaccine 73+ Years old  Completed   Hepatitis C Screening  Completed   Zoster Vaccines- Shingrix  Completed   HPV VACCINES  Aged Out    Health Maintenance  Health Maintenance Due  Topic Date Due   Colonoscopy  04/04/2020   INFLUENZA VACCINE  01/31/2023   COVID-19 Vaccine (4 - 2023-24 season) 03/03/2023   Medicare Annual Wellness (AWV)  04/05/2023    Colorectal cancer screening: Type of screening: Cologuard. Completed 04/13/2021. Repeat every 3 years  Lung Cancer Screening: (Low Dose CT Chest recommended if Age 43-80 years, 20 pack-year currently smoking OR have quit w/in 15years.) does not qualify.   Lung Cancer Screening Referral: na  Additional Screening:  Hepatitis C Screening: does not qualify; Completed 11/11/2015  Vision Screening: Recommended  annual ophthalmology exams for early detection of glaucoma and other disorders of the eye. Is the patient up to date with their annual eye exam?  Yes  Who is the provider or what is the name of the office in which the patient attends annual eye exams?  Dr. Ashley Royalty If pt is not established with a provider, would they like to be referred to a provider to establish care? No .   Dental Screening: Recommended annual dental exams for proper oral hygiene  Diabetic Foot Exam: na  Community Resource Referral / Chronic Care Management: CRR required this visit?  No   CCM required this visit?  No     Plan:     I have personally reviewed and noted the following in the patient's chart:   Medical and social history Use of alcohol, tobacco or illicit drugs  Current medications and supplements including opioid prescriptions. Patient is not currently taking opioid prescriptions. Functional ability and status Nutritional status Physical activity Advanced directives List of other physicians Hospitalizations, surgeries, and ER visits in previous 12 months Vitals Screenings to include cognitive, depression, and falls Referrals and appointments  In addition, I have reviewed and discussed with patient certain preventive protocols, quality metrics, and best practice recommendations. A written personalized care plan for preventive services as well as general preventive health recommendations were provided to patient.     Jordan Hawks Lenyx Boody, CMA   05/22/2023   After Visit Summary: (MyChart) Due to this being a telephonic visit, the after visit summary with patients personalized plan was offered to patient via MyChart   Nurse Notes: see routing comment

## 2023-05-23 ENCOUNTER — Encounter: Payer: Self-pay | Admitting: Nurse Practitioner

## 2023-05-23 ENCOUNTER — Ambulatory Visit: Payer: Medicare HMO | Admitting: Nurse Practitioner

## 2023-05-23 ENCOUNTER — Other Ambulatory Visit: Payer: Self-pay | Admitting: Family Medicine

## 2023-05-23 ENCOUNTER — Other Ambulatory Visit (HOSPITAL_COMMUNITY): Payer: Self-pay

## 2023-05-23 VITALS — BP 138/83 | HR 87 | Temp 97.4°F | Ht 70.0 in | Wt 265.6 lb

## 2023-05-23 DIAGNOSIS — Z23 Encounter for immunization: Secondary | ICD-10-CM | POA: Diagnosis not present

## 2023-05-23 DIAGNOSIS — I10 Essential (primary) hypertension: Secondary | ICD-10-CM

## 2023-05-23 DIAGNOSIS — N401 Enlarged prostate with lower urinary tract symptoms: Secondary | ICD-10-CM

## 2023-05-23 MED ORDER — TAMSULOSIN HCL 0.4 MG PO CAPS
0.4000 mg | ORAL_CAPSULE | Freq: Two times a day (BID) | ORAL | 0 refills | Status: DC
  Filled 2023-07-02: qty 180, 90d supply, fill #0

## 2023-05-23 NOTE — Progress Notes (Signed)
Established Patient Office Visit  Subjective   Patient ID: Lawrence Bennett, male    DOB: [birth year]  Age: 67 y.o. MRN: [id number]  Chief Complaint  Patient presents with   Hypertension    Blood pressure elevated at home for a while 160/100 this morning    HPI Lawrence Bennett 67 year old male present today May 23, 2023 for an acute visit concern for elevated BP He has a diagnosis of hypertension currently being managed with amlodipine 10 mg daily which he is not report taking as prescribed.  He had a well check yesterday over the phone and no report of blood pressure or 151/86.  Blood pressure at the office today 138/83.  patient has been checking his blood pressure at home with a small cuff which could be the reason why his BP has been elevated.  he was advised to get up larger cough and give him a BP log to check his BP at home and to come back in 1 week for nurse visit to make sure that his hypertensive medication needs to be adjusted.  He denies chest pain, syncope, headache change in vision or blurry vision Patient Active Problem List   Diagnosis Date Noted   Abdominal aortic aneurysm (AAA) greater than 5.5 cm in diameter in male (HCC) 08/21/2021   Bilateral sensorineural hearing loss 04/03/2021   Loose stools 12/13/2019   Onychomycosis of toenail 08/26/2018   Pre-diabetes 07/25/2017   History of stroke 03/23/2016   HLD (hyperlipidemia) 12/20/2015   BPH (benign prostatic hyperplasia) 11/11/2015   Major depressive disorder, recurrent episode, moderate (HCC)    Diverticulosis of colon without hemorrhage 05/02/2014   Essential hypertension 04/15/2014   Anxiety 04/15/2014   Past Medical History:  Diagnosis Date   AAA (abdominal aortic aneurysm) (HCC)    Anxiety    Atherosclerosis of aorta (HCC)    Atrial contractions, premature    Depression    Dyslipidemia    Dyspnea    Elevated glucose 11/11/2015   Hyperlipidemia    Mild hypertension    Stroke [facility])    Urinary  hesitancy    Past Surgical History:  Procedure Laterality Date   ABDOMINAL AORTIC ENDOVASCULAR STENT GRAFT Bilateral 08/21/2021   Procedure: ENDOVASCULAR AORTIC STENT GRAFT REPAIR;  Surgeon: Victorino Sparrow, MD;  Location: [facility] OR;  Service: Vascular;  Laterality: Bilateral;   COLON RESECTION  2012   ULTRASOUND GUIDANCE FOR VASCULAR ACCESS Bilateral 08/21/2021   Procedure: ULTRASOUND GUIDANCE FOR VASCULAR ACCESS;  Surgeon: Victorino Sparrow, MD;  Location: [facility];  Service: Vascular;  Laterality: Bilateral;   Social History   Tobacco Use   Smoking status: Former    Current packs/day: 0.00    Average packs/day: 0.2 packs/day for 19.0 years (3.8 ttl pk-yrs)    Types: Cigarettes    Start date: 78    Quit date: 2015    Years since quitting: 9.8   Smokeless tobacco: Never  Vaping Use   Vaping status: Never Used  Substance Use Topics   Alcohol use: No    Alcohol/week: 0.0 standard drinks of alcohol   Drug use: No   Social History   Socioeconomic History   Marital status: Married    Spouse name: Not on file   Number of children: 3   Years of education: Not on file   Highest education level: Not on file  Occupational History   Occupation: retired  Tobacco Use   Smoking status: Former    Current packs/day: 0.00  Average packs/day: 0.2 packs/day for 19.0 years (3.8 ttl pk-yrs)    Types: Cigarettes    Start date: 46    Quit date: 2015    Years since quitting: 9.8   Smokeless tobacco: Never  Vaping Use   Vaping status: Never Used  Substance and Sexual Activity   Alcohol use: No    Alcohol/week: 0.0 standard drinks of alcohol   Drug use: No   Sexual activity: Not Currently  Other Topics Concern   Not on file  Social History Narrative   Right handed   Lives with wife   Social Determinants of Health   Financial Resource Strain: Low Risk  (05/22/2023)   Overall Financial Resource Strain (CARDIA)    Difficulty of Paying Living Expenses: Not hard at all  Food  Insecurity: No Food Insecurity (05/22/2023)   Hunger Vital Sign    Worried About Running Out of Food in the Last Year: Never true    Ran Out of Food in the Last Year: Never true  Transportation Needs: No Transportation Needs (05/22/2023)   PRAPARE - Administrator, Civil Service (Medical): No    Lack of Transportation (Non-Medical): No  Physical Activity: Insufficiently Active (05/22/2023)   Exercise Vital Sign    Days of Exercise per Week: 7 days    Minutes of Exercise per Session: 20 min  Stress: No Stress Concern Present (05/22/2023)   [employer] of Occupational Health - Occupational Stress Questionnaire    Feeling of Stress : Not at all  Social Connections: Moderately Integrated (05/22/2023)   Social Connection and Isolation Panel [NHANES]    Frequency of Communication with Friends and Family: More than three times a week    Frequency of Social Gatherings with Friends and Family: More than three times a week    Attends Religious Services: More than 4 times per year    Active Member of [employer] or Organizations: No    Attends Banker Meetings: Never    Marital Status: Married  Catering manager Violence: Not At Risk (05/22/2023)   Humiliation, Afraid, Rape, and Kick questionnaire    Fear of Current or Ex-Partner: No    Emotionally Abused: No    Physically Abused: No    Sexually Abused: No   Family Status  Relation Name Status   Mother  Deceased   Father  Deceased       stroke   Sister  Alive   Sister  Alive   Sister  Alive   Neg Hx  (Not Specified)  No partnership data on file   Family History  Problem Relation Age of Onset   COPD Mother    Stroke Father    Bipolar disorder Father    Hypertension Sister    Depression Sister    Colon cancer Neg Hx    Esophageal cancer Neg Hx    Inflammatory bowel disease Neg Hx    Liver disease Neg Hx    Rectal cancer Neg Hx    Pancreatic cancer Neg Hx    Stomach cancer Neg Hx    No Known  Allergies    Review of Systems  Constitutional:  Negative for chills and fever.  Eyes:  Negative for blurred vision and double vision.  Cardiovascular:  Negative for chest pain and leg swelling.  Gastrointestinal:  Negative for nausea and vomiting.  Musculoskeletal:  Negative for falls and myalgias.  Neurological:  Negative for dizziness, weakness and headaches.   Negative unless indicated in HPI  Objective:     BP 138/83   Pulse 87   Temp (!) 97.4 F (36.3 C) (Temporal)   Ht 5\' 10"  (1.778 m)   Wt 265 lb 9.6 oz (120.5 kg)   SpO2 94%   BMI 38.11 kg/m  BP Readings from Last 3 Encounters:  05/23/23 138/83  05/22/23 (!) 151/86  03/22/23 (!) 172/78   Wt Readings from Last 3 Encounters:  05/23/23 265 lb 9.6 oz (120.5 kg)  05/22/23 265 lb (120.2 kg)  03/22/23 263 lb 14.4 oz (119.7 kg)      Physical Exam Vitals and nursing note reviewed.  Constitutional:      Appearance: He is obese.  HENT:     Head: Normocephalic and atraumatic.  Eyes:     Extraocular Movements: Extraocular movements intact.     Conjunctiva/sclera: Conjunctivae normal.     Pupils: Pupils are equal, round, and reactive to light.  Cardiovascular:     Rate and Rhythm: Normal rate and regular rhythm.  Pulmonary:     Effort: Pulmonary effort is normal.     Breath sounds: Normal breath sounds.  Musculoskeletal:        General: Normal range of motion.     Right lower leg: No edema.     Left lower leg: No edema.  Skin:    General: Skin is warm and dry.  Neurological:     Mental Status: He is alert and oriented to person, place, and time. Mental status is at baseline.  Psychiatric:        Mood and Affect: Mood normal.        Behavior: Behavior normal.        Thought Content: Thought content normal.        Judgment: Judgment normal.      No results found for any visits on 05/23/23.  Last CBC Lab Results  Component Value Date   WBC 12.0 (H) 08/22/2021   HGB 13.1 08/22/2021   HCT 39.2  08/22/2021   MCV 90.3 08/22/2021   MCH 30.2 08/22/2021   RDW 14.1 08/22/2021   PLT 234 08/22/2021   Last metabolic panel Lab Results  Component Value Date   GLUCOSE 99 10/23/2021   NA 139 10/23/2021   K 4.7 10/23/2021   CL 103 10/23/2021   CO2 22 10/23/2021   BUN 14 10/23/2021   CREATININE 1.09 10/23/2021   EGFR 74 10/23/2021   CALCIUM 9.5 10/23/2021   PROT 6.3 10/23/2021   ALBUMIN 4.0 10/23/2021   LABGLOB 2.3 10/23/2021   AGRATIO 1.7 10/23/2021   BILITOT 0.3 10/23/2021   ALKPHOS 109 10/23/2021   AST 15 10/23/2021   ALT 24 10/23/2021   ANIONGAP 7 08/22/2021   Last lipids Lab Results  Component Value Date   CHOL 129 04/18/2022   HDL 40 04/18/2022   LDLCALC 73 04/18/2022   TRIG 81 04/18/2022   CHOLHDL 3.2 04/18/2022   Last hemoglobin A1c Lab Results  Component Value Date   HGBA1C 6.4 (H) 04/18/2022   Last thyroid functions Lab Results  Component Value Date   TSH 3.040 10/23/2021        Assessment & Plan:  Essential hypertension  Encounter for immunization -     Flu Vaccine Trivalent High Dose (Fluad)   Mirza 67 year old Caucasian male, no acute distress BP at the office 138/83, BP log provided to the client, he is instructed to get a bigger cuff size to check his BP twice a day and to bring the log  back to his appointment in a week with the nurse for blood pressure checkup and determine the need for possible medication adjustment Continue taking all prescribed medication Encourage DASH diet encourage healthy lifestyle choices, including diet (rich in fruits, vegetables, and lean proteins, and low in salt and simple carbohydrates) and exercise (at least 30 minutes of moderate physical activity daily).     The above assessment and management plan was discussed with the patient. The patient verbalized understanding of and has agreed to the management plan. Patient is aware to call the clinic if they develop any new symptoms or if symptoms persist or worsen.  Patient is aware when to return to the clinic for a follow-up visit. Patient educated on when it is appropriate to go to the emergency department.  Return for 1-week with nurse visit.   Arrie Aran Santa Lighter, DNP [facility] [address] [place], [place] [address] [phone]

## 2023-06-03 ENCOUNTER — Other Ambulatory Visit (HOSPITAL_COMMUNITY): Payer: Self-pay

## 2023-06-03 ENCOUNTER — Other Ambulatory Visit: Payer: Self-pay

## 2023-06-03 ENCOUNTER — Telehealth: Payer: Self-pay | Admitting: Family Medicine

## 2023-06-03 NOTE — Telephone Encounter (Signed)
Copied from CRM [id number]. Topic: Appointments - Appointment Scheduling >> Jun 03, 2023 10:38 AM Tiffany H wrote: Patient called to cancel 06/04/23 appointment for vaccine and BP follow up. Patient was unable to get BP cuff as directed at 05/23/23 appointment but now has it. Patient will follow up with BP checks at 06/24/23 appointment with Dr. Nadine Counts.

## 2023-06-04 ENCOUNTER — Ambulatory Visit: Payer: Medicare HMO

## 2023-06-07 ENCOUNTER — Other Ambulatory Visit (HOSPITAL_COMMUNITY): Payer: Self-pay

## 2023-06-18 ENCOUNTER — Other Ambulatory Visit: Payer: Self-pay

## 2023-06-21 DIAGNOSIS — H5213 Myopia, bilateral: Secondary | ICD-10-CM | POA: Diagnosis not present

## 2023-06-24 ENCOUNTER — Encounter: Payer: Self-pay | Admitting: Family Medicine

## 2023-06-24 ENCOUNTER — Other Ambulatory Visit (HOSPITAL_COMMUNITY): Payer: Self-pay

## 2023-06-24 ENCOUNTER — Other Ambulatory Visit (HOSPITAL_BASED_OUTPATIENT_CLINIC_OR_DEPARTMENT_OTHER): Payer: Self-pay

## 2023-06-24 ENCOUNTER — Ambulatory Visit (INDEPENDENT_AMBULATORY_CARE_PROVIDER_SITE_OTHER): Payer: Medicare HMO | Admitting: Family Medicine

## 2023-06-24 VITALS — BP 154/88 | HR 75 | Temp 98.2°F | Ht 70.0 in | Wt 265.0 lb

## 2023-06-24 DIAGNOSIS — R7303 Prediabetes: Secondary | ICD-10-CM

## 2023-06-24 DIAGNOSIS — Z0001 Encounter for general adult medical examination with abnormal findings: Secondary | ICD-10-CM | POA: Diagnosis not present

## 2023-06-24 DIAGNOSIS — Z0000 Encounter for general adult medical examination without abnormal findings: Secondary | ICD-10-CM

## 2023-06-24 DIAGNOSIS — Z8679 Personal history of other diseases of the circulatory system: Secondary | ICD-10-CM | POA: Diagnosis not present

## 2023-06-24 DIAGNOSIS — Z9889 Other specified postprocedural states: Secondary | ICD-10-CM | POA: Diagnosis not present

## 2023-06-24 DIAGNOSIS — I10 Essential (primary) hypertension: Secondary | ICD-10-CM

## 2023-06-24 DIAGNOSIS — N3281 Overactive bladder: Secondary | ICD-10-CM

## 2023-06-24 DIAGNOSIS — L989 Disorder of the skin and subcutaneous tissue, unspecified: Secondary | ICD-10-CM | POA: Diagnosis not present

## 2023-06-24 DIAGNOSIS — E782 Mixed hyperlipidemia: Secondary | ICD-10-CM | POA: Diagnosis not present

## 2023-06-24 DIAGNOSIS — R3912 Poor urinary stream: Secondary | ICD-10-CM | POA: Diagnosis not present

## 2023-06-24 DIAGNOSIS — Z8673 Personal history of transient ischemic attack (TIA), and cerebral infarction without residual deficits: Secondary | ICD-10-CM | POA: Diagnosis not present

## 2023-06-24 DIAGNOSIS — N401 Enlarged prostate with lower urinary tract symptoms: Secondary | ICD-10-CM

## 2023-06-24 LAB — BAYER DCA HB A1C WAIVED: HB A1C (BAYER DCA - WAIVED): 6.4 % — ABNORMAL HIGH (ref 4.8–5.6)

## 2023-06-24 MED ORDER — LOSARTAN POTASSIUM 100 MG PO TABS
100.0000 mg | ORAL_TABLET | Freq: Every day | ORAL | 3 refills | Status: DC
  Filled 2023-06-24: qty 90, 90d supply, fill #0

## 2023-06-24 MED ORDER — AMLODIPINE BESYLATE 10 MG PO TABS
10.0000 mg | ORAL_TABLET | Freq: Every day | ORAL | 3 refills | Status: DC
  Filled 2023-06-24: qty 90, 90d supply, fill #0

## 2023-06-24 MED ORDER — OXYBUTYNIN CHLORIDE ER 5 MG PO TB24
5.0000 mg | ORAL_TABLET | Freq: Every day | ORAL | 0 refills | Status: DC
  Filled 2023-06-24: qty 90, 90d supply, fill #0

## 2023-06-24 NOTE — Progress Notes (Signed)
Lawrence Bennett is a 67 y.o. male presents to office today for annual physical exam examination.    Concerns today include: 1.  Hypertension Patient reports that he is compliant with all medications.  He has been measuring blood pressures pretty consistently in the 160s over 90s to 100s at home.  He does note that a few months ago he did have his blood pressure medication manufacturer changed and he wonders if perhaps this might be impacting.  He does drink a soda daily.  He does not add salt to his food but does eat out a couple of times per week.  He is not exercising regularly but attributes this to some balance issues.  He is under the care of neurology for this as he has a history of stroke.  Occupation: does Runner, broadcasting/film/video, Marital status: married, Substance use: none There are no preventive care reminders to display for this patient.  Refills needed today: all  Immunization History  Administered Date(s) Administered   Fluad Quad(high Dose 65+) 06/10/2020, 04/11/2021, 04/17/2022   Fluad Trivalent(High Dose 65+) 05/23/2023   Influenza Inj Mdck Quad Pf 03/24/2018   Influenza,inj,Quad PF,6+ Mos 03/23/2016, 03/26/2017, 03/26/2019   PFIZER Comirnaty(Gray Top)Covid-19 Tri-Sucrose Vaccine 11/12/2019, 12/03/2019, 06/20/2020   Pneumococcal Conjugate-13 05/26/2018   Pneumococcal Polysaccharide-23 10/07/2019   Tdap 04/23/2018   Zoster Recombinant(Shingrix) 04/11/2021, 10/12/2021   Past Medical History:  Diagnosis Date   AAA (abdominal aortic aneurysm) (HCC)    Anxiety    Atherosclerosis of aorta (HCC)    Atrial contractions, premature    Depression    Dyslipidemia    Dyspnea    Elevated glucose 11/11/2015   Hyperlipidemia    Mild hypertension    Stroke (HCC)    Urinary hesitancy    Social History   Socioeconomic History   Marital status: Married    Spouse name: Not on file   Number of children: 3   Years of education: Not on file   Highest education level: Not on  file  Occupational History   Occupation: retired  Tobacco Use   Smoking status: Former    Current packs/day: 0.00    Average packs/day: 0.2 packs/day for 19.0 years (3.8 ttl pk-yrs)    Types: Cigarettes    Start date: 8    Quit date: 2015    Years since quitting: 9.9   Smokeless tobacco: Never  Vaping Use   Vaping status: Never Used  Substance and Sexual Activity   Alcohol use: No    Alcohol/week: 0.0 standard drinks of alcohol   Drug use: No   Sexual activity: Not Currently  Other Topics Concern   Not on file  Social History Narrative   Right handed   Lives with wife   Social Drivers of Health   Financial Resource Strain: Low Risk  (05/22/2023)   Overall Financial Resource Strain (CARDIA)    Difficulty of Paying Living Expenses: Not hard at all  Food Insecurity: No Food Insecurity (05/22/2023)   Hunger Vital Sign    Worried About Radiation protection practitioner of Food in the Last Year: Never true    Ran Out of Food in the Last Year: Never true  Transportation Needs: No Transportation Needs (05/22/2023)   PRAPARE - Administrator, Civil Service (Medical): No    Lack of Transportation (Non-Medical): No  Physical Activity: Insufficiently Active (05/22/2023)   Exercise Vital Sign    Days of Exercise per Week: 7 days    Minutes of Exercise per  Session: 20 min  Stress: No Stress Concern Present (05/22/2023)   [employer] of Occupational Health - Occupational Stress Questionnaire    Feeling of Stress : Not at all  Social Connections: Moderately Integrated (05/22/2023)   Social Connection and Isolation Panel [NHANES]    Frequency of Communication with Friends and Family: More than three times a week    Frequency of Social Gatherings with Friends and Family: More than three times a week    Attends Religious Services: More than 4 times per year    Active Member of [employer] or Organizations: No    Attends Banker Meetings: Never    Marital Status: Married   Catering manager Violence: Not At Risk (05/22/2023)   Humiliation, Afraid, Rape, and Kick questionnaire    Fear of Current or Ex-Partner: No    Emotionally Abused: No    Physically Abused: No    Sexually Abused: No   Past Surgical History:  Procedure Laterality Date   ABDOMINAL AORTIC ENDOVASCULAR STENT GRAFT Bilateral 08/21/2021   Procedure: ENDOVASCULAR AORTIC STENT GRAFT REPAIR;  Surgeon: Victorino Sparrow, MD;  Location: [facility] OR;  Service: Vascular;  Laterality: Bilateral;   COLON RESECTION  2012   ULTRASOUND GUIDANCE FOR VASCULAR ACCESS Bilateral 08/21/2021   Procedure: ULTRASOUND GUIDANCE FOR VASCULAR ACCESS;  Surgeon: Victorino Sparrow, MD;  Location: [facility] OR;  Service: Vascular;  Laterality: Bilateral;   Family History  Problem Relation Age of Onset   COPD Mother    Stroke Father    Bipolar disorder Father    Hypertension Sister    Depression Sister    Colon cancer Neg Hx    Esophageal cancer Neg Hx    Inflammatory bowel disease Neg Hx    Liver disease Neg Hx    Rectal cancer Neg Hx    Pancreatic cancer Neg Hx    Stomach cancer Neg Hx     Current Outpatient Medications:    aspirin EC 81 MG tablet, Take 81 mg by mouth daily. Swallow whole., Disp: , Rfl:    atorvastatin (LIPITOR) 80 MG tablet, Take 1 tablet (80 mg total) by mouth daily., Disp: 90 tablet, Rfl: 1   clopidogrel (PLAVIX) 75 MG tablet, Take 1 tablet by mouth once daily., Disp: 30 tablet, Rfl: 11   divalproex (DEPAKOTE ER) 500 MG 24 hr tablet, Take 1 tablet (500 mg) by mouth at bedtime., Disp: 90 tablet, Rfl: 2   LORazepam (ATIVAN) 0.5 MG tablet, Take 1 tablet (0.5 mg) by mouth 3 times daily., Disp: 90 tablet, Rfl: 2   PARoxetine (PAXIL) 20 MG tablet, Take 1 tablet (20 mg total) by mouth at bedtime., Disp: 90 tablet, Rfl: 2   QUEtiapine (SEROQUEL) 50 MG tablet, Take 1 tablet (50 mg total) by mouth at bedtime., Disp: 90 tablet, Rfl: 2   tamsulosin (FLOMAX) 0.4 MG CAPS capsule, Take 1 capsule (0.4 mg total) by mouth in  the morning and at bedtime., Disp: 180 capsule, Rfl: 0   traZODone (DESYREL) 50 MG tablet, Take 1 tablet (50 mg total) by mouth at bedtime., Disp: 90 tablet, Rfl: 2   venlafaxine XR (EFFEXOR-XR) 75 MG 24 hr capsule, Take 1 capsule (75 mg total) by mouth daily with breakfast., Disp: 90 capsule, Rfl: 2   vitamin C (ASCORBIC ACID) 500 MG tablet, Take 500 mg by mouth daily., Disp: , Rfl:    zinc gluconate 50 MG tablet, Take 50 mg by mouth daily., Disp: , Rfl:    amLODipine (NORVASC) 10 MG  tablet, Take 1 tablet (10 mg total) by mouth daily., Disp: 90 tablet, Rfl: 3   ketoconazole (NIZORAL) 2 % cream, Apply 1 application topically daily for 28 days., Disp: 60 g, Rfl: 0   losartan (COZAAR) 100 MG tablet, Take 1 tablet (100 mg total) by mouth daily. Dose change, Disp: 90 tablet, Rfl: 3   oxybutynin (DITROPAN XL) 5 MG 24 hr tablet, Take 1 tablet (5 mg total) by mouth at bedtime. For overactive bladder, Disp: 90 tablet, Rfl: 0  No Known Allergies   ROS: Review of Systems A comprehensive review of systems was negative except for: Eyes: positive for contacts/glasses Ears, nose, mouth, throat, and face: positive for hearing loss Genitourinary: positive for frequency Integument/breast: positive for skin lesion(s)He has got a lesion on his left leg that was evaluated by Dr. Jorja Loa previously.  He would likely need excision.  He reports tender. Neurological: positive for gait problems    Physical exam BP (!) 154/88   Pulse 75   Temp 98.2 F (36.8 C)   Ht 5\' 10"  (1.778 m)   Wt 265 lb (120.2 kg)   SpO2 93%   BMI 38.02 kg/m  General appearance: alert, cooperative, appears stated age, no distress, and morbidly obese Head: Normocephalic, without obvious abnormality, atraumatic Eyes: negative findings: lids and lashes normal, conjunctivae and sclerae normal, corneas clear, and pupils equal, round, reactive to light and accomodation Ears:  Dried skin in the external auditory canals bilaterally.  Has a  hemostatic bleed noted along the tragus of the left ear Nose: Nares normal. Septum midline. Mucosa normal. No drainage or sinus tenderness. Throat: lips, mucosa, and tongue normal; teeth and gums normal Neck: no adenopathy, supple, symmetrical, trachea midline, and thyroid not enlarged, symmetric, no tenderness/mass/nodules Back:  Mildly increased kyphosis Lungs: clear to auscultation bilaterally Chest wall: no tenderness Heart: regular rate and rhythm, S1, S2 normal, no murmur, click, rub or gallop Abdomen:  Protuberant, obese, nontender.  Liver palpated about 1 fingerbreadth below the costal margin Extremities: extremities normal, atraumatic, no cyanosis or edema Pulses: 2+ and symmetric Skin:  Dried skin.  Several seborrheic keratoses noted along the trunk and back.  He has several hemangiomas noted along the abdomen.  Cystic lesion along the left medial calf that does not appear to have any secondary infection.  Its approximately pea-sized Lymph nodes: Cervical, supraclavicular, and axillary nodes normal. Neurologic: Very hard of hearing and deaf in the left ear.     05/22/2023    3:20 PM 10/31/2022    1:02 PM 07/31/2022    2:06 PM  Depression screen PHQ 2/9  Decreased Interest 0  0  Down, Depressed, Hopeless 0  0  PHQ - 2 Score 0  0  Altered sleeping 0  0  Tired, decreased energy 0  0  Change in appetite 0  0  Feeling bad or failure about yourself  0  0  Trouble concentrating 0  0  Moving slowly or fidgety/restless 0  0  Suicidal thoughts 0  0  PHQ-9 Score 0  0  Difficult doing work/chores Not difficult at all  Not difficult at all     Information is confidential and restricted. Go to Review Flowsheets to unlock data.      10/31/2022    1:02 PM 07/31/2022    2:06 PM 05/15/2022    3:00 PM 10/12/2021    1:04 PM  GAD 7 : Generalized Anxiety Score  Nervous, Anxious, on Edge  0 0 0  Control/stop  worrying  0 0 0  Worry too much - different things  0 0 0  Trouble relaxing  0 0 0   Restless  0 0 0  Easily annoyed or irritable  0 0 0  Afraid - awful might happen  0 0 0  Total GAD 7 Score  0 0 0  Anxiety Difficulty  Not difficult at all Not difficult at all Not difficult at all     Information is confidential and restricted. Go to Review Flowsheets to unlock data.     Assessment/ Plan: [name] here for annual physical exam.   Annual physical exam  Essential hypertension - Plan: CMP14+EGFR, losartan (COZAAR) 100 MG tablet, amLODipine (NORVASC) 10 MG tablet  History of aortic aneurysm repair - Plan: CBC  Benign prostatic hyperplasia with weak urinary stream - Plan: PSA, CBC  Pre-diabetes - Plan: Bayer DCA Hb A1c Waived  Mixed hyperlipidemia - Plan: CMP14+EGFR, Lipid Panel, TSH  History of stroke - Plan: CMP14+EGFR, Lipid Panel, CBC  Overactive bladder - Plan: oxybutynin (DITROPAN XL) 5 MG 24 hr tablet  Skin lesion of left leg - Plan: Ambulatory referral to Dermatology  Labs collected today.  Amlodipine renewed.  I have advanced his losartan to 100 mg daily.  We discussed the risk of recurrent stroke with uncontrolled blood pressure.  We discussed salt reduction, increase physical activity.  He was accompanied today by his family member who will monitor blood pressure at home and report back to me in a few weeks.  Goal is less than 140/90 given CVA.  May need to consider adding diuretic if blood pressure remains uncontrolled  Check CBC, PSA given reports of urinary frequency.  He is on tamsulosin.  Adding low-dose Ditropan.  Caution with blood pressure and age  Continue statin.  Check lipid, A1c  Referral to dermatology for lesion of concern and for full-body physical exam  Counseled on healthy lifestyle choices, including diet (rich in fruits, vegetables and lean meats and low in salt and simple carbohydrates) and exercise (at least 30 minutes of moderate physical activity daily).  Patient to follow up 62m for BP check  Nimsi Males M. Nadine Counts,  DO

## 2023-06-25 LAB — CMP14+EGFR
ALT: 24 [IU]/L (ref 0–44)
AST: 18 [IU]/L (ref 0–40)
Albumin: 4.1 g/dL (ref 3.9–4.9)
Alkaline Phosphatase: 95 [IU]/L (ref 44–121)
BUN/Creatinine Ratio: 11 (ref 10–24)
BUN: 11 mg/dL (ref 8–27)
Bilirubin Total: 0.4 mg/dL (ref 0.0–1.2)
CO2: 23 mmol/L (ref 20–29)
Calcium: 9.3 mg/dL (ref 8.6–10.2)
Chloride: 104 mmol/L (ref 96–106)
Creatinine, Ser: 1 mg/dL (ref 0.76–1.27)
Globulin, Total: 2.1 g/dL (ref 1.5–4.5)
Glucose: 105 mg/dL — ABNORMAL HIGH (ref 70–99)
Potassium: 4.5 mmol/L (ref 3.5–5.2)
Sodium: 140 mmol/L (ref 134–144)
Total Protein: 6.2 g/dL (ref 6.0–8.5)
eGFR: 82 mL/min/{1.73_m2} (ref >59)

## 2023-06-25 LAB — PSA: Prostate Specific Ag, Serum: 2.1 ng/mL (ref 0.0–4.0)

## 2023-06-25 LAB — LIPID PANEL
Chol/HDL Ratio: 3.2 {ratio} (ref 0.0–5.0)
Cholesterol, Total: 149 mg/dL (ref 100–199)
HDL: 47 mg/dL (ref >39)
LDL Chol Calc (NIH): 85 mg/dL (ref 0–99)
Triglycerides: 92 mg/dL (ref 0–149)
VLDL Cholesterol Cal: 17 mg/dL (ref 5–40)

## 2023-06-25 LAB — TSH: TSH: 2.01 u[IU]/mL (ref 0.450–4.500)

## 2023-06-25 LAB — CBC
Hematocrit: 42.6 % (ref 37.5–51.0)
Hemoglobin: 14.5 g/dL (ref 13.0–17.7)
MCH: 30.7 pg (ref 26.6–33.0)
MCHC: 34 g/dL (ref 31.5–35.7)
MCV: 90 fL (ref 79–97)
Platelets: 252 10*3/uL (ref 150–450)
RBC: 4.73 x10E6/uL (ref 4.14–5.80)
RDW: 13.9 % (ref 11.6–15.4)
WBC: 6.1 10*3/uL (ref 3.4–10.8)

## 2023-06-28 ENCOUNTER — Encounter: Payer: Self-pay | Admitting: Family Medicine

## 2023-06-28 NOTE — Telephone Encounter (Signed)
Copied from CRM [id number]. Topic: Clinical - Lab/Test Results >> Jun 28, 2023 11:31 AM Ivette P wrote: Reason for CRM: Patient missed call and would like a call back about his results call back [phone].

## 2023-07-01 ENCOUNTER — Other Ambulatory Visit (HOSPITAL_COMMUNITY): Payer: Self-pay

## 2023-07-01 ENCOUNTER — Telehealth: Payer: Self-pay

## 2023-07-01 NOTE — Telephone Encounter (Signed)
Pharmacy Patient Advocate Encounter   Received notification from CoverMyMeds that prior authorization for oxyBUTYnin Chloride ER 5MG  er tablets is required/requested.   Insurance verification completed.   The patient is insured through [place] .   Per test claim: PA required; PA submitted to above mentioned insurance via CoverMyMeds Key/confirmation #/EOC [id number] Status is pending

## 2023-07-02 ENCOUNTER — Other Ambulatory Visit (HOSPITAL_COMMUNITY): Payer: Self-pay

## 2023-07-02 NOTE — Telephone Encounter (Signed)
Pharmacy Patient Advocate Encounter  Received notification from [facility] that Prior Authorization for oxyBUTYnin Chloride ER 5MG  er tablets  has been APPROVED from 07/02/22 to 07/01/24   PA #/Case ID/Reference #:  [id number]

## 2023-07-05 ENCOUNTER — Other Ambulatory Visit: Payer: Self-pay

## 2023-07-08 ENCOUNTER — Other Ambulatory Visit (HOSPITAL_COMMUNITY): Payer: Self-pay

## 2023-07-08 ENCOUNTER — Encounter: Payer: Self-pay | Admitting: Family Medicine

## 2023-07-08 ENCOUNTER — Other Ambulatory Visit: Payer: Self-pay

## 2023-07-08 ENCOUNTER — Other Ambulatory Visit (HOSPITAL_BASED_OUTPATIENT_CLINIC_OR_DEPARTMENT_OTHER): Payer: Self-pay

## 2023-07-08 MED ORDER — OLMESARTAN-AMLODIPINE-HCTZ 40-10-12.5 MG PO TABS
1.0000 | ORAL_TABLET | Freq: Every day | ORAL | 0 refills | Status: DC
  Filled 2023-07-08: qty 30, 30d supply, fill #0

## 2023-07-15 ENCOUNTER — Telehealth: Payer: Self-pay | Admitting: Family Medicine

## 2023-07-15 DIAGNOSIS — I10 Essential (primary) hypertension: Secondary | ICD-10-CM | POA: Diagnosis not present

## 2023-07-15 DIAGNOSIS — Z9889 Other specified postprocedural states: Secondary | ICD-10-CM | POA: Diagnosis not present

## 2023-07-15 DIAGNOSIS — Z8679 Personal history of other diseases of the circulatory system: Secondary | ICD-10-CM | POA: Diagnosis not present

## 2023-07-15 NOTE — Telephone Encounter (Signed)
 Copied from CRM [id number]. Topic: Referral - Status >> Jul 12, 2023  1:28 PM Antonio H wrote: Reason for CRM: Patient's daughter Max called to see if dermatology referral can be sent to other places because [facility] could not see him until the end of summer and patient is needing sooner appointment. She also wants to let you know she has already reached out to [facility] at their different locations and they don't have anything until the end of summer. She would like to be called in regards to updates and the status of this referral so she can stay on top of it. Her number is [phone]. She has made him an appointment where she works at [employer] just in case.

## 2023-07-15 NOTE — Telephone Encounter (Signed)
 Unfortunately Dermatology Office's are booking several months out and Office's like [facility] are not accepting New Patient's at this time. Since Daughter has made an Appt with [facility] I have rerouted the Referral to their Office.

## 2023-07-22 ENCOUNTER — Other Ambulatory Visit: Payer: Self-pay

## 2023-07-22 ENCOUNTER — Other Ambulatory Visit (HOSPITAL_COMMUNITY): Payer: Self-pay

## 2023-07-23 ENCOUNTER — Other Ambulatory Visit (HOSPITAL_COMMUNITY): Payer: Self-pay

## 2023-07-23 ENCOUNTER — Other Ambulatory Visit (HOSPITAL_COMMUNITY): Payer: Self-pay | Admitting: Psychiatry

## 2023-07-23 MED ORDER — LORAZEPAM 0.5 MG PO TABS
0.5000 mg | ORAL_TABLET | Freq: Three times a day (TID) | ORAL | 2 refills | Status: DC
  Filled 2023-07-23 – 2023-08-05 (×3): qty 90, 30d supply, fill #0
  Filled 2023-08-29: qty 90, 30d supply, fill #1
  Filled 2023-09-26: qty 90, 30d supply, fill #2

## 2023-07-24 ENCOUNTER — Other Ambulatory Visit (HOSPITAL_COMMUNITY): Payer: Self-pay

## 2023-07-24 MED ORDER — AMOXICILLIN 500 MG PO CAPS
ORAL_CAPSULE | ORAL | 0 refills | Status: AC
  Filled 2023-07-24 (×2): qty 14, 5d supply, fill #0

## 2023-07-26 ENCOUNTER — Ambulatory Visit: Payer: Medicare HMO | Admitting: Cardiology

## 2023-07-29 ENCOUNTER — Ambulatory Visit: Payer: Medicare HMO | Admitting: Cardiology

## 2023-07-29 ENCOUNTER — Other Ambulatory Visit (HOSPITAL_COMMUNITY): Payer: Self-pay

## 2023-07-29 ENCOUNTER — Other Ambulatory Visit: Payer: Self-pay | Admitting: Family Medicine

## 2023-07-29 DIAGNOSIS — E782 Mixed hyperlipidemia: Secondary | ICD-10-CM

## 2023-07-30 ENCOUNTER — Other Ambulatory Visit (HOSPITAL_COMMUNITY): Payer: Self-pay

## 2023-07-30 ENCOUNTER — Other Ambulatory Visit: Payer: Self-pay

## 2023-07-30 ENCOUNTER — Telehealth: Payer: Medicare HMO | Admitting: Physician Assistant

## 2023-07-30 DIAGNOSIS — J208 Acute bronchitis due to other specified organisms: Secondary | ICD-10-CM | POA: Diagnosis not present

## 2023-07-30 DIAGNOSIS — B9689 Other specified bacterial agents as the cause of diseases classified elsewhere: Secondary | ICD-10-CM

## 2023-07-30 MED ORDER — DOXYCYCLINE HYCLATE 100 MG PO TABS
100.0000 mg | ORAL_TABLET | Freq: Two times a day (BID) | ORAL | 0 refills | Status: DC
  Filled 2023-07-30: qty 14, 7d supply, fill #0

## 2023-07-30 MED ORDER — OLMESARTAN-AMLODIPINE-HCTZ 40-10-12.5 MG PO TABS
1.0000 | ORAL_TABLET | Freq: Every day | ORAL | 2 refills | Status: DC
  Filled 2023-07-30 – 2023-08-06 (×6): qty 90, 90d supply, fill #0
  Filled 2023-11-02 – 2023-11-05 (×2): qty 90, 90d supply, fill #1

## 2023-07-30 MED ORDER — BENZONATATE 100 MG PO CAPS
100.0000 mg | ORAL_CAPSULE | Freq: Three times a day (TID) | ORAL | 0 refills | Status: DC | PRN
  Filled 2023-07-30: qty 30, 10d supply, fill #0

## 2023-07-30 MED ORDER — ATORVASTATIN CALCIUM 80 MG PO TABS
80.0000 mg | ORAL_TABLET | Freq: Every day | ORAL | 0 refills | Status: DC
  Filled 2023-07-30: qty 90, 90d supply, fill #0

## 2023-07-30 NOTE — Progress Notes (Signed)
Virtual Visit Consent   Lawrence Bennett, you are scheduled for a virtual visit with a [facility] provider today. Just as with appointments in the office, your consent must be obtained to participate. Your consent will be active for this visit and any virtual visit you may have with one of our providers in the next 365 days. If you have a MyChart account, a copy of this consent can be sent to you electronically.  As this is a virtual visit, video technology does not allow for your provider to perform a traditional examination. This may limit your provider's ability to fully assess your condition. If your provider identifies any concerns that need to be evaluated in person or the need to arrange testing (such as labs, EKG, etc.), we will make arrangements to do so. Although advances in technology are sophisticated, we cannot ensure that it will always work on either your end or our end. If the connection with a video visit is poor, the visit may have to be switched to a telephone visit. With either a video or telephone visit, we are not always able to ensure that we have a secure connection.  By engaging in this virtual visit, you consent to the provision of healthcare and authorize for your insurance to be billed (if applicable) for the services provided during this visit. Depending on your insurance coverage, you may receive a charge related to this service.  I need to obtain your verbal consent now. Are you willing to proceed with your visit today? Lawrence Bennett has provided verbal consent on 07/30/2023 for a virtual visit (video or telephone). Piedad Climes, [place]  Date: 07/30/2023 2:22 PM  Virtual Visit via Video Note   I, Piedad Climes, connected with  Lawrence Bennett  ([id number], 1955/10/31) on 07/30/23 at  2:15 PM EST by a video-enabled telemedicine application and verified that I am speaking with the correct person using two identifiers.  Location: Patient: Virtual Visit  Location Patient: Home Provider: Virtual Visit Location Provider: Home Office   I discussed the limitations of evaluation and management by telemedicine and the availability of in person appointments. The patient expressed understanding and agreed to proceed.    History of Present Illness: Lawrence Bennett is a 68 y.o. who identifies as a male who was assigned male at birth, and is being seen today for persistent chest congestion and cough that is productive of thick green phlegm. Some nasal congestion at present but without sinus pain. Some green nasal discharge. Symptoms have been present for the past two weeks.   OTC -- Delsym, Theraflu  HPI: HPI  Problems:  Patient Active Problem List   Diagnosis Date Noted   Abdominal aortic aneurysm (AAA) greater than 5.5 cm in diameter in male (HCC) 08/21/2021   Bilateral sensorineural hearing loss 04/03/2021   Pre-diabetes 07/25/2017   History of stroke 03/23/2016   HLD (hyperlipidemia) 12/20/2015   BPH (benign prostatic hyperplasia) 11/11/2015   Major depressive disorder, recurrent episode, moderate (HCC)    Diverticulosis of colon without hemorrhage 05/02/2014   Essential hypertension 04/15/2014   Anxiety 04/15/2014    Allergies: No Known Allergies Medications:  Current Outpatient Medications:    amLODipine (NORVASC) 10 MG tablet, Take 1 tablet (10 mg total) by mouth daily., Disp: 90 tablet, Rfl: 3   aspirin EC 81 MG tablet, Take 81 mg by mouth daily. Swallow whole., Disp: , Rfl:    atorvastatin (LIPITOR) 80 MG tablet, Take 1 tablet (80 mg  total) by mouth daily., Disp: 90 tablet, Rfl: 0   clopidogrel (PLAVIX) 75 MG tablet, Take 1 tablet by mouth once daily., Disp: 30 tablet, Rfl: 11   divalproex (DEPAKOTE ER) 500 MG 24 hr tablet, Take 1 tablet (500 mg) by mouth at bedtime., Disp: 90 tablet, Rfl: 2   ketoconazole (NIZORAL) 2 % cream, Apply 1 application topically daily for 28 days., Disp: 60 g, Rfl: 0   LORazepam (ATIVAN) 0.5 MG tablet,  Take 1 tablet (0.5 mg) by mouth 3 times daily., Disp: 90 tablet, Rfl: 2   losartan (COZAAR) 100 MG tablet, Take 1 tablet (100 mg total) by mouth daily. Dose change, Disp: 90 tablet, Rfl: 3   Olmesartan-amLODIPine-HCTZ 40-10-12.5 MG TABS, Take 1 tablet by mouth daily., Disp: 90 tablet, Rfl: 2   oxybutynin (DITROPAN XL) 5 MG 24 hr tablet, Take 1 tablet (5 mg total) by mouth at bedtime. For overactive bladder, Disp: 90 tablet, Rfl: 0   PARoxetine (PAXIL) 20 MG tablet, Take 1 tablet (20 mg total) by mouth at bedtime., Disp: 90 tablet, Rfl: 2   QUEtiapine (SEROQUEL) 50 MG tablet, Take 1 tablet (50 mg total) by mouth at bedtime., Disp: 90 tablet, Rfl: 2   tamsulosin (FLOMAX) 0.4 MG CAPS capsule, Take 1 capsule (0.4 mg total) by mouth in the morning and at bedtime., Disp: 180 capsule, Rfl: 0   traZODone (DESYREL) 50 MG tablet, Take 1 tablet (50 mg total) by mouth at bedtime., Disp: 90 tablet, Rfl: 2   venlafaxine XR (EFFEXOR-XR) 75 MG 24 hr capsule, Take 1 capsule (75 mg total) by mouth daily with breakfast., Disp: 90 capsule, Rfl: 2   vitamin C (ASCORBIC ACID) 500 MG tablet, Take 500 mg by mouth daily., Disp: , Rfl:    zinc gluconate 50 MG tablet, Take 50 mg by mouth daily., Disp: , Rfl:   Observations/Objective: Patient is well-developed, well-nourished in no acute distress.  Resting comfortably at home.  Head is normocephalic, atraumatic.  No labored breathing. Speech is clear and coherent with logical content.  Patient is alert and oriented at baseline.   Assessment and Plan: 1. Acute bacterial bronchitis (Primary)  Rx Doxycycline.  Increase fluids.  Rest.  Saline nasal spray.  Probiotic.  Mucinex as directed.  Humidifier in bedroom. Tessalon per orders.  Call or return to clinic if symptoms are not improving.   Follow Up Instructions: I discussed the assessment and treatment plan with the patient. The patient was provided an opportunity to ask questions and all were answered. The patient  agreed with the plan and demonstrated an understanding of the instructions.  A copy of instructions were sent to the patient via MyChart unless otherwise noted below.   The patient was advised to call back or seek an in-person evaluation if the symptoms worsen or if the condition fails to improve as anticipated.    Piedad Climes, PA-C

## 2023-07-30 NOTE — Patient Instructions (Signed)
Lawrence Bennett, thank you for joining Piedad Climes, PA-C for today's virtual visit.  While this provider is not your primary care provider (PCP), if your PCP is located in our provider database this encounter information will be shared with them immediately following your visit.   A [facility] MyChart account gives you access to today's visit and all your visits, tests, and labs performed at [facility] " click here if you don't have a [facility] MyChart account or go to [email or web address]  Consent: (Patient) Lawrence Bennett provided verbal consent for this virtual visit at the beginning of the encounter.  Current Medications:  Current Outpatient Medications:    amLODipine (NORVASC) 10 MG tablet, Take 1 tablet (10 mg total) by mouth daily., Disp: 90 tablet, Rfl: 3   aspirin EC 81 MG tablet, Take 81 mg by mouth daily. Swallow whole., Disp: , Rfl:    atorvastatin (LIPITOR) 80 MG tablet, Take 1 tablet (80 mg total) by mouth daily., Disp: 90 tablet, Rfl: 0   clopidogrel (PLAVIX) 75 MG tablet, Take 1 tablet by mouth once daily., Disp: 30 tablet, Rfl: 11   divalproex (DEPAKOTE ER) 500 MG 24 hr tablet, Take 1 tablet (500 mg) by mouth at bedtime., Disp: 90 tablet, Rfl: 2   ketoconazole (NIZORAL) 2 % cream, Apply 1 application topically daily for 28 days., Disp: 60 g, Rfl: 0   LORazepam (ATIVAN) 0.5 MG tablet, Take 1 tablet (0.5 mg) by mouth 3 times daily., Disp: 90 tablet, Rfl: 2   losartan (COZAAR) 100 MG tablet, Take 1 tablet (100 mg total) by mouth daily. Dose change, Disp: 90 tablet, Rfl: 3   Olmesartan-amLODIPine-HCTZ 40-10-12.5 MG TABS, Take 1 tablet by mouth daily., Disp: 90 tablet, Rfl: 2   oxybutynin (DITROPAN XL) 5 MG 24 hr tablet, Take 1 tablet (5 mg total) by mouth at bedtime. For overactive bladder, Disp: 90 tablet, Rfl: 0   PARoxetine (PAXIL) 20 MG tablet, Take 1 tablet (20 mg total) by mouth at bedtime., Disp: 90 tablet, Rfl: 2   QUEtiapine (SEROQUEL) 50  MG tablet, Take 1 tablet (50 mg total) by mouth at bedtime., Disp: 90 tablet, Rfl: 2   tamsulosin (FLOMAX) 0.4 MG CAPS capsule, Take 1 capsule (0.4 mg total) by mouth in the morning and at bedtime., Disp: 180 capsule, Rfl: 0   traZODone (DESYREL) 50 MG tablet, Take 1 tablet (50 mg total) by mouth at bedtime., Disp: 90 tablet, Rfl: 2   venlafaxine XR (EFFEXOR-XR) 75 MG 24 hr capsule, Take 1 capsule (75 mg total) by mouth daily with breakfast., Disp: 90 capsule, Rfl: 2   vitamin C (ASCORBIC ACID) 500 MG tablet, Take 500 mg by mouth daily., Disp: , Rfl:    zinc gluconate 50 MG tablet, Take 50 mg by mouth daily., Disp: , Rfl:    Medications ordered in this encounter:  No orders of the defined types were placed in this encounter.    *If you need refills on other medications prior to your next appointment, please contact your pharmacy*  Follow-Up: Call back or seek an in-person evaluation if the symptoms worsen or if the condition fails to improve as anticipated.  [facility] [phone]  Other Instructions Take antibiotic (Doxycycline) as directed.  Increase fluids.  Get plenty of rest. Use Mucinex for congestion. Tessalon per orders. Take a daily probiotic (I recommend Align or Culturelle, but even Activia Yogurt may be beneficial).  A humidifier placed in the bedroom may offer some  relief for a dry, scratchy throat of nasal irritation.  Read information below on acute bronchitis. Please call or return to clinic if symptoms are not improving.  Acute Bronchitis Bronchitis is when the airways that extend from the windpipe into the lungs get red, puffy, and painful (inflamed). Bronchitis often causes thick spit (mucus) to develop. This leads to a cough. A cough is the most common symptom of bronchitis. In acute bronchitis, the condition usually begins suddenly and goes away over time (usually in 2 weeks). Smoking, allergies, and asthma can make bronchitis worse. Repeated episodes of  bronchitis may cause more lung problems.  HOME CARE Rest. Drink enough fluids to keep your pee (urine) clear or pale yellow (unless you need to limit fluids as told by your doctor). Only take over-the-counter or prescription medicines as told by your doctor. Avoid smoking and secondhand smoke. These can make bronchitis worse. If you are a smoker, think about using nicotine gum or skin patches. Quitting smoking will help your lungs heal faster. Reduce the chance of getting bronchitis again by: Washing your hands often. Avoiding people with cold symptoms. Trying not to touch your hands to your mouth, nose, or eyes. Follow up with your doctor as told.  GET HELP IF: Your symptoms do not improve after 1 week of treatment. Symptoms include: Cough. Fever. Coughing up thick spit. Body aches. Chest congestion. Chills. Shortness of breath. Sore throat.  GET HELP RIGHT AWAY IF:  You have an increased fever. You have chills. You have severe shortness of breath. You have bloody thick spit (sputum). You throw up (vomit) often. You lose too much body fluid (dehydration). You have a severe headache. You faint.  MAKE SURE YOU:  Understand these instructions. Will watch your condition. Will get help right away if you are not doing well or get worse. Document Released: 12/05/2007 Document Revised: 02/18/2013 Document Reviewed: 12/09/2012 [facility] Patient Information 2015 [place], [place]. This information is not intended to replace advice given to you by your health care provider. Make sure you discuss any questions you have with your health care provider.    If you have been instructed to have an in-person evaluation today at a local Urgent Care facility, please use the link below. It will take you to a list of all of our available [facility], including address, phone number and hours of operation. Please do not delay care.  [facility]  If you or a family member  do not have a primary care provider, use the link below to schedule a visit and establish care. When you choose a [facility] primary care physician or advanced practice provider, you gain a long-term partner in health. Find a Primary Care Provider  Learn more about [facility]'s in-office and virtual care options: [facility] - Get Care Now

## 2023-08-01 ENCOUNTER — Other Ambulatory Visit (HOSPITAL_COMMUNITY): Payer: Self-pay

## 2023-08-02 ENCOUNTER — Other Ambulatory Visit: Payer: Self-pay

## 2023-08-02 ENCOUNTER — Other Ambulatory Visit (HOSPITAL_COMMUNITY): Payer: Self-pay

## 2023-08-03 ENCOUNTER — Other Ambulatory Visit (HOSPITAL_COMMUNITY): Payer: Self-pay

## 2023-08-05 ENCOUNTER — Other Ambulatory Visit: Payer: Self-pay

## 2023-08-05 ENCOUNTER — Other Ambulatory Visit (HOSPITAL_COMMUNITY): Payer: Self-pay

## 2023-08-06 ENCOUNTER — Other Ambulatory Visit (HOSPITAL_COMMUNITY): Payer: Self-pay

## 2023-08-06 ENCOUNTER — Other Ambulatory Visit: Payer: Self-pay

## 2023-08-12 ENCOUNTER — Other Ambulatory Visit (HOSPITAL_COMMUNITY): Payer: Self-pay

## 2023-08-16 ENCOUNTER — Other Ambulatory Visit (HOSPITAL_COMMUNITY): Payer: Self-pay

## 2023-08-16 DIAGNOSIS — I2510 Atherosclerotic heart disease of native coronary artery without angina pectoris: Secondary | ICD-10-CM | POA: Diagnosis not present

## 2023-08-16 DIAGNOSIS — R0602 Shortness of breath: Secondary | ICD-10-CM | POA: Diagnosis not present

## 2023-08-16 DIAGNOSIS — I700 Atherosclerosis of aorta: Secondary | ICD-10-CM | POA: Diagnosis not present

## 2023-08-16 DIAGNOSIS — I10 Essential (primary) hypertension: Secondary | ICD-10-CM | POA: Diagnosis not present

## 2023-08-16 DIAGNOSIS — Z6841 Body Mass Index (BMI) 40.0 and over, adult: Secondary | ICD-10-CM | POA: Diagnosis not present

## 2023-08-16 DIAGNOSIS — Z5181 Encounter for therapeutic drug level monitoring: Secondary | ICD-10-CM | POA: Diagnosis not present

## 2023-08-16 DIAGNOSIS — I708 Atherosclerosis of other arteries: Secondary | ICD-10-CM | POA: Diagnosis not present

## 2023-08-16 DIAGNOSIS — R600 Localized edema: Secondary | ICD-10-CM | POA: Diagnosis not present

## 2023-08-16 DIAGNOSIS — R7303 Prediabetes: Secondary | ICD-10-CM | POA: Diagnosis not present

## 2023-08-16 DIAGNOSIS — Z79899 Other long term (current) drug therapy: Secondary | ICD-10-CM | POA: Diagnosis not present

## 2023-08-16 MED ORDER — ATORVASTATIN CALCIUM 80 MG PO TABS
80.0000 mg | ORAL_TABLET | Freq: Every day | ORAL | 3 refills | Status: DC
  Filled 2023-08-16 – 2023-11-02 (×3): qty 90, 90d supply, fill #0
  Filled 2024-02-05: qty 90, 90d supply, fill #1
  Filled 2024-02-26 – 2024-04-27 (×2): qty 90, 90d supply, fill #2
  Filled 2024-05-07: qty 90, 90d supply, fill #3

## 2023-08-16 MED ORDER — OLMESARTAN-AMLODIPINE-HCTZ 40-10-12.5 MG PO TABS
1.0000 | ORAL_TABLET | Freq: Every day | ORAL | 3 refills | Status: DC
Start: 2023-08-16 — End: 2023-11-10
  Filled 2023-08-16: qty 90, 90d supply, fill #0

## 2023-08-16 MED ORDER — WEGOVY 0.25 MG/0.5ML ~~LOC~~ SOAJ
0.2500 mg | SUBCUTANEOUS | 0 refills | Status: DC
Start: 2023-08-16 — End: 2023-11-14
  Filled 2023-08-16: qty 1, 30d supply, fill #0
  Filled 2023-08-22 – 2023-08-30 (×2): qty 2, 28d supply, fill #0

## 2023-08-16 MED ORDER — CLOPIDOGREL BISULFATE 75 MG PO TABS
75.0000 mg | ORAL_TABLET | Freq: Every day | ORAL | 3 refills | Status: DC
  Filled 2023-08-16 – 2023-09-01 (×2): qty 90, 90d supply, fill #0

## 2023-08-21 ENCOUNTER — Other Ambulatory Visit (HOSPITAL_COMMUNITY): Payer: Self-pay

## 2023-08-21 DIAGNOSIS — D2372 Other benign neoplasm of skin of left lower limb, including hip: Secondary | ICD-10-CM | POA: Diagnosis not present

## 2023-08-21 DIAGNOSIS — L821 Other seborrheic keratosis: Secondary | ICD-10-CM | POA: Diagnosis not present

## 2023-08-22 ENCOUNTER — Other Ambulatory Visit (HOSPITAL_COMMUNITY): Payer: Self-pay

## 2023-08-26 DIAGNOSIS — R0602 Shortness of breath: Secondary | ICD-10-CM | POA: Diagnosis not present

## 2023-08-26 DIAGNOSIS — I2510 Atherosclerotic heart disease of native coronary artery without angina pectoris: Secondary | ICD-10-CM | POA: Diagnosis not present

## 2023-08-26 DIAGNOSIS — I10 Essential (primary) hypertension: Secondary | ICD-10-CM | POA: Diagnosis not present

## 2023-08-27 ENCOUNTER — Ambulatory Visit: Payer: Medicare HMO | Admitting: Family Medicine

## 2023-08-29 ENCOUNTER — Other Ambulatory Visit (HOSPITAL_COMMUNITY): Payer: Self-pay

## 2023-08-30 ENCOUNTER — Other Ambulatory Visit (HOSPITAL_COMMUNITY): Payer: Self-pay

## 2023-09-02 ENCOUNTER — Other Ambulatory Visit: Payer: Self-pay

## 2023-09-02 ENCOUNTER — Other Ambulatory Visit (HOSPITAL_COMMUNITY): Payer: Self-pay

## 2023-09-07 ENCOUNTER — Other Ambulatory Visit (HOSPITAL_COMMUNITY): Payer: Self-pay

## 2023-09-13 DIAGNOSIS — R0602 Shortness of breath: Secondary | ICD-10-CM | POA: Diagnosis not present

## 2023-09-13 DIAGNOSIS — I700 Atherosclerosis of aorta: Secondary | ICD-10-CM | POA: Diagnosis not present

## 2023-09-13 DIAGNOSIS — I708 Atherosclerosis of other arteries: Secondary | ICD-10-CM | POA: Diagnosis not present

## 2023-09-13 DIAGNOSIS — R600 Localized edema: Secondary | ICD-10-CM | POA: Diagnosis not present

## 2023-09-23 ENCOUNTER — Other Ambulatory Visit (HOSPITAL_COMMUNITY): Payer: Self-pay

## 2023-09-23 ENCOUNTER — Other Ambulatory Visit: Payer: Self-pay | Admitting: Family Medicine

## 2023-09-23 DIAGNOSIS — N401 Enlarged prostate with lower urinary tract symptoms: Secondary | ICD-10-CM

## 2023-09-23 MED ORDER — TAMSULOSIN HCL 0.4 MG PO CAPS
0.4000 mg | ORAL_CAPSULE | Freq: Two times a day (BID) | ORAL | 0 refills | Status: DC
  Filled 2023-10-22: qty 180, 90d supply, fill #0

## 2023-09-24 DIAGNOSIS — I6529 Occlusion and stenosis of unspecified carotid artery: Secondary | ICD-10-CM | POA: Diagnosis not present

## 2023-09-24 DIAGNOSIS — R7303 Prediabetes: Secondary | ICD-10-CM | POA: Diagnosis not present

## 2023-09-24 DIAGNOSIS — I2510 Atherosclerotic heart disease of native coronary artery without angina pectoris: Secondary | ICD-10-CM | POA: Diagnosis not present

## 2023-09-24 DIAGNOSIS — E785 Hyperlipidemia, unspecified: Secondary | ICD-10-CM | POA: Diagnosis not present

## 2023-09-24 DIAGNOSIS — R600 Localized edema: Secondary | ICD-10-CM | POA: Diagnosis not present

## 2023-09-24 DIAGNOSIS — I10 Essential (primary) hypertension: Secondary | ICD-10-CM | POA: Diagnosis not present

## 2023-09-24 DIAGNOSIS — Z8679 Personal history of other diseases of the circulatory system: Secondary | ICD-10-CM | POA: Diagnosis not present

## 2023-09-24 DIAGNOSIS — Z9889 Other specified postprocedural states: Secondary | ICD-10-CM | POA: Diagnosis not present

## 2023-09-24 DIAGNOSIS — I739 Peripheral vascular disease, unspecified: Secondary | ICD-10-CM | POA: Diagnosis not present

## 2023-09-26 ENCOUNTER — Other Ambulatory Visit (HOSPITAL_COMMUNITY): Payer: Self-pay

## 2023-10-03 ENCOUNTER — Other Ambulatory Visit (HOSPITAL_COMMUNITY): Payer: Self-pay

## 2023-10-03 MED ORDER — ATORVASTATIN CALCIUM 80 MG PO TABS
80.0000 mg | ORAL_TABLET | Freq: Every day | ORAL | 3 refills | Status: DC
Start: 2023-10-03 — End: 2023-11-10
  Filled 2023-10-03: qty 90, 90d supply, fill #0

## 2023-10-03 MED ORDER — WEGOVY 0.25 MG/0.5ML ~~LOC~~ SOAJ
0.2500 mg | SUBCUTANEOUS | 0 refills | Status: DC
Start: 2023-10-03 — End: 2023-11-14
  Filled 2023-10-03: qty 2, 28d supply, fill #0

## 2023-10-03 MED ORDER — OLMESARTAN-AMLODIPINE-HCTZ 40-10-12.5 MG PO TABS
1.0000 | ORAL_TABLET | Freq: Every day | ORAL | 3 refills | Status: DC
  Filled 2023-10-03: qty 90, 90d supply, fill #0

## 2023-10-07 ENCOUNTER — Other Ambulatory Visit (HOSPITAL_COMMUNITY): Payer: Self-pay

## 2023-10-10 ENCOUNTER — Other Ambulatory Visit (HOSPITAL_COMMUNITY): Payer: Self-pay

## 2023-10-10 MED ORDER — OZEMPIC (1 MG/DOSE) 4 MG/3ML ~~LOC~~ SOPN
1.0000 mg | PEN_INJECTOR | SUBCUTANEOUS | 2 refills | Status: DC
Start: 2023-10-10 — End: 2023-11-14
  Filled 2023-10-10: qty 3, 28d supply, fill #0

## 2023-10-14 ENCOUNTER — Other Ambulatory Visit (HOSPITAL_COMMUNITY): Payer: Self-pay

## 2023-10-15 ENCOUNTER — Other Ambulatory Visit (HOSPITAL_COMMUNITY): Payer: Self-pay

## 2023-10-22 ENCOUNTER — Other Ambulatory Visit: Payer: Self-pay

## 2023-10-22 ENCOUNTER — Other Ambulatory Visit (HOSPITAL_COMMUNITY): Payer: Self-pay

## 2023-10-22 ENCOUNTER — Other Ambulatory Visit (HOSPITAL_COMMUNITY): Payer: Self-pay | Admitting: Psychiatry

## 2023-10-22 MED ORDER — LORAZEPAM 0.5 MG PO TABS
0.5000 mg | ORAL_TABLET | Freq: Three times a day (TID) | ORAL | 2 refills | Status: DC
Start: 2023-10-22 — End: 2023-10-30
  Filled 2023-10-22 – 2023-10-24 (×2): qty 90, 30d supply, fill #0

## 2023-10-24 ENCOUNTER — Other Ambulatory Visit (HOSPITAL_COMMUNITY): Payer: Self-pay

## 2023-10-30 ENCOUNTER — Encounter (HOSPITAL_COMMUNITY): Payer: Self-pay | Admitting: Psychiatry

## 2023-10-30 ENCOUNTER — Other Ambulatory Visit (HOSPITAL_COMMUNITY): Payer: Self-pay

## 2023-10-30 ENCOUNTER — Telehealth (HOSPITAL_COMMUNITY): Payer: Medicare HMO | Admitting: Psychiatry

## 2023-10-30 DIAGNOSIS — F3160 Bipolar disorder, current episode mixed, unspecified: Secondary | ICD-10-CM | POA: Diagnosis not present

## 2023-10-30 MED ORDER — LORAZEPAM 0.5 MG PO TABS
0.5000 mg | ORAL_TABLET | Freq: Three times a day (TID) | ORAL | 5 refills | Status: DC
  Filled 2023-10-30: qty 90, 30d supply, fill #0

## 2023-10-30 MED ORDER — DIVALPROEX SODIUM ER 500 MG PO TB24
500.0000 mg | ORAL_TABLET | Freq: Every day | ORAL | 2 refills | Status: DC
  Filled 2023-10-30: qty 90, 90d supply, fill #0

## 2023-10-30 MED ORDER — VENLAFAXINE HCL ER 75 MG PO CP24
75.0000 mg | ORAL_CAPSULE | Freq: Every day | ORAL | 2 refills | Status: DC
  Filled 2023-10-30: qty 90, 90d supply, fill #0

## 2023-10-30 MED ORDER — PAROXETINE HCL 20 MG PO TABS
20.0000 mg | ORAL_TABLET | Freq: Every day | ORAL | 2 refills | Status: DC
  Filled 2023-10-30: qty 90, 90d supply, fill #0

## 2023-10-30 MED ORDER — TRAZODONE HCL 50 MG PO TABS
50.0000 mg | ORAL_TABLET | Freq: Every day | ORAL | 2 refills | Status: DC
  Filled 2023-10-30: qty 90, 90d supply, fill #0

## 2023-10-30 MED ORDER — QUETIAPINE FUMARATE 50 MG PO TABS
50.0000 mg | ORAL_TABLET | Freq: Every day | ORAL | 2 refills | Status: DC
  Filled 2023-10-30: qty 90, 90d supply, fill #0

## 2023-10-30 NOTE — Progress Notes (Signed)
 Virtual Visit via Video Note  I connected with Lawrence Bennett on 10/30/23 at  1:40 PM EDT by a video enabled telemedicine application and verified that I am speaking with the correct person using two identifiers.  Location: Patient: home Provider: office   I discussed the limitations of evaluation and management by telemedicine and the availability of in person appointments. The patient expressed understanding and agreed to proceed.     I discussed the assessment and treatment plan with the patient. The patient was provided an opportunity to ask questions and all were answered. The patient agreed with the plan and demonstrated an understanding of the instructions.   The patient was advised to call back or seek an in-person evaluation if the symptoms worsen or if the condition fails to improve as anticipated.  I provided 20 minutes of non-face-to-face time during this encounter.   Lawrence Annas, MD  [facility] MD/PA/NP OP Progress Note  10/30/2023 1:54 PM Lawrence Bennett  MRN:  [id number]  Chief Complaint:  Chief Complaint  Patient presents with   Depression   Anxiety   Follow-up   HPI:  This patient is a 68 year old married white male who lives with his wife and 3 children in [place]. 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  He continues to do well by his report.  He is still very hard of hearing and had a hard time hearing me during this appointment.  He only wears his hearing aids part of the time.  He had labs recently done and his A1c is still 6.4 but other than that they are all normal.  He is trying to get on Wegovy  to lose weight.  He is sleeping well.  His mood has been good and he denies significant depression anxiety insomnia or thoughts of self-harm or suicide. Visit Diagnosis:    ICD-10-CM   1. Bipolar I disorder, most recent episode mixed  (HCC)  F31.60       Past Psychiatric History: Hospitalized in the geriatric psychiatry unit in 2015 for severe depression  Past Medical History:  Past Medical History:  Diagnosis Date   AAA (abdominal aortic aneurysm) (HCC)    Anxiety    Atherosclerosis of aorta (HCC)    Atrial contractions, premature    Depression    Dyslipidemia    Dyspnea    Elevated glucose 11/11/2015   Hyperlipidemia    Mild hypertension    Stroke [facility])    Urinary hesitancy     Past Surgical History:  Procedure Laterality Date   ABDOMINAL AORTIC ENDOVASCULAR STENT GRAFT Bilateral 08/21/2021   Procedure: ENDOVASCULAR AORTIC STENT GRAFT REPAIR;  Surgeon: Kayla Part, MD;  Location: [facility] OR;  Service: Vascular;  Laterality: Bilateral;   COLON RESECTION  2012   ULTRASOUND GUIDANCE FOR VASCULAR ACCESS Bilateral 08/21/2021   Procedure: ULTRASOUND GUIDANCE FOR VASCULAR ACCESS;  Surgeon: Kayla Part, MD;  Location: [facility] OR;  Service: Vascular;  Laterality: Bilateral;    Family Psychiatric History: See below  Family History:  Family History  Problem Relation Age of Onset   COPD Mother    Stroke Father    Bipolar disorder Father    Hypertension Sister    Depression Sister    Colon cancer Neg Hx    Esophageal cancer Neg Hx    Inflammatory bowel disease Neg Hx  Liver disease Neg Hx    Rectal cancer Neg Hx    Pancreatic cancer Neg Hx    Stomach cancer Neg Hx     Social History:  Social History   Socioeconomic History   Marital status: Married    Spouse name: Not on file   Number of children: 3   Years of education: Not on file   Highest education level: Not on file  Occupational History   Occupation: retired  Tobacco Use   Smoking status: Former    Current packs/day: 0.00    Average packs/day: 0.2 packs/day for 19.0 years (3.8 ttl pk-yrs)    Types: Cigarettes    Start date: 55    Quit date: 2015    Years since quitting: 10.3   Smokeless tobacco: Never  Vaping Use   Vaping status:  Never Used  Substance and Sexual Activity   Alcohol use: No    Alcohol/week: 0.0 standard drinks of alcohol   Drug use: No   Sexual activity: Not Currently  Other Topics Concern   Not on file  Social History Narrative   Right handed   Lives with wife   Social Drivers of Health   Financial Resource Strain: Low Risk  (05/22/2023)   Overall Financial Resource Strain (CARDIA)    Difficulty of Paying Living Expenses: Not hard at all  Food Insecurity: No Food Insecurity (05/22/2023)   Hunger Vital Sign    Worried About Running Out of Food in the Last Year: Never true    Ran Out of Food in the Last Year: Never true  Transportation Needs: No Transportation Needs (05/22/2023)   PRAPARE - Administrator, Civil Service (Medical): No    Lack of Transportation (Non-Medical): No  Physical Activity: Insufficiently Active (05/22/2023)   Exercise Vital Sign    Days of Exercise per Week: 7 days    Minutes of Exercise per Session: 20 min  Stress: No Stress Concern Present (05/22/2023)   [employer] of Occupational Health - Occupational Stress Questionnaire    Feeling of Stress : Not at all  Social Connections: Moderately Integrated (05/22/2023)   Social Connection and Isolation Panel [NHANES]    Frequency of Communication with Friends and Family: More than three times a week    Frequency of Social Gatherings with Friends and Family: More than three times a week    Attends Religious Services: More than 4 times per year    Active Member of Clubs or Organizations: No    Attends Banker Meetings: Never    Marital Status: Married    Allergies: No Known Allergies  Metabolic Disorder Labs: Lab Results  Component Value Date   HGBA1C 6.4 (H) 06/24/2023   No results found for: "PROLACTIN" Lab Results  Component Value Date   CHOL 149 06/24/2023   TRIG 92 06/24/2023   HDL 47 06/24/2023   CHOLHDL 3.2 06/24/2023   LDLCALC 85 06/24/2023   LDLCALC 73 04/18/2022    Lab Results  Component Value Date   TSH 2.010 06/24/2023   TSH 3.040 10/23/2021    Therapeutic Level Labs: No results found for: "LITHIUM" Lab Results  Component Value Date   VALPROATE 65.3 11/21/2022   VALPROATE 41.8 (L) 11/29/2016   No results found for: "CBMZ"  Current Medications: Current Outpatient Medications  Medication Sig Dispense Refill   atorvastatin  (LIPITOR ) 80 MG tablet Take 1 tablet (80 mg total) by mouth daily. 90 tablet 3   atorvastatin  (LIPITOR ) 80 MG tablet  1 tab by mouth daily 90 tablet 3   clopidogrel  (PLAVIX ) 75 MG tablet Take 1 tablet by mouth once daily. 30 tablet 11   clopidogrel  (PLAVIX ) 75 MG tablet Take 1 tablet (75 mg total) by mouth daily. 90 tablet 3   divalproex  (DEPAKOTE  ER) 500 MG 24 hr tablet Take 1 tablet (500 mg) by mouth at bedtime. 90 tablet 2   LORazepam  (ATIVAN ) 0.5 MG tablet Take 1 tablet (0.5 mg) by mouth 3 times daily. 90 tablet 5   Olmesartan -amLODIPine -HCTZ 40-10-12.5 MG TABS Take 1 tablet by mouth daily. 90 tablet 2   Olmesartan -amLODIPine -HCTZ 40-10-12.5 MG TABS Take 1 tablet by mouth daily. 90 tablet 3   Olmesartan -amLODIPine -HCTZ 40-10-12.5 MG TABS 1 tab by mouth daily 90 tablet 3   oxybutynin  (DITROPAN  XL) 5 MG 24 hr tablet Take 1 tablet (5 mg total) by mouth at bedtime. For overactive bladder 90 tablet 0   PARoxetine  (PAXIL ) 20 MG tablet Take 1 tablet (20 mg total) by mouth at bedtime. 90 tablet 2   QUEtiapine  (SEROQUEL ) 50 MG tablet Take 1 tablet (50 mg total) by mouth at bedtime. 90 tablet 2   Semaglutide , 1 MG/DOSE, (OZEMPIC , 1 MG/DOSE,) 4 MG/3ML SOPN Inject 1 mg into the skin once a week. 3 mL 2   Semaglutide -Weight Management (WEGOVY ) 0.25 MG/0.5ML SOAJ Inject 0.25 mg into the skin once a week. 2 mL 0   Semaglutide -Weight Management (WEGOVY ) 0.25 MG/0.5ML SOAJ Inject 0.25 mg into the skin once a week. Administer weeks 1-4 of therapy. 4 mL 0   tamsulosin  (FLOMAX ) 0.4 MG CAPS capsule Take 1 capsule (0.4 mg total) by mouth in  the morning and at bedtime. 180 capsule 0   traZODone  (DESYREL ) 50 MG tablet Take 1 tablet (50 mg total) by mouth at bedtime. 90 tablet 2   venlafaxine  XR (EFFEXOR -XR) 75 MG 24 hr capsule Take 1 capsule (75 mg total) by mouth daily with breakfast. 90 capsule 2   vitamin C (ASCORBIC ACID) 500 MG tablet Take 500 mg by mouth daily.     zinc gluconate 50 MG tablet Take 50 mg by mouth daily.     No current facility-administered medications for this visit.     Musculoskeletal: Strength & Muscle Tone: within normal limits Gait & Station: normal Patient leans: N/A  Psychiatric Specialty Exam: Review of Systems  HENT:  Positive for hearing loss.   All other systems reviewed and are negative.   There were no vitals taken for this visit.There is no height or weight on file to calculate BMI.  General Appearance: Casual and Fairly Groomed  Eye Contact:  Good  Speech:  Clear and Coherent  Volume:  Normal  Mood:  Euthymic  Affect:  Congruent  Thought Process:  Goal Directed  Orientation:  Full (Time, Place, and Person)  Thought Content: WDL   Suicidal Thoughts:  No  Homicidal Thoughts:  No  Memory:  Immediate;   Good Recent;   Good Remote;   Fair  Judgement:  Good  Insight:  Fair  Psychomotor Activity:  Normal  Concentration:  Concentration: Good and Attention Span: Good  Recall:  Good  Fund of Knowledge: Good  Language: Good  Akathisia:  No  Handed:  Right  AIMS (if indicated): not done  Assets:  Communication Skills Desire for Improvement Resilience Social Support Talents/Skills  ADL's:  Intact  Cognition: WNL  Sleep:  Good   Screenings: GAD-7    Flowsheet Row Office Visit from 10/31/2022 in [facility] Visit from 07/31/2022 in [facility] Office Visit from 05/15/2022 in [facility] Office Visit from 10/12/2021 in [facility] Office Visit from 04/11/2021 in [facility]  Total GAD-7 Score 0 0 0 0 0      PHQ2-9    Flowsheet Row Clinical Support from 05/22/2023 in [facility] Office Visit from 10/31/2022 in [facility] [place] Office Visit from 07/31/2022 in [facility] Office Visit from 05/15/2022 in [facility] Video Visit from 04/12/2022 in [facility] at [facility] Total Score 0 0 0 0 0  PHQ-9 Total Score 0 0 0 -- --      Flowsheet Row Pre-Admission Testing 60 from 08/18/2021 in [facility] PREADMISSION TESTING Video Visit from 02/13/2021 in [facility] at [place]  C-SSRS RISK CATEGORY No Risk No Risk        Assessment and Plan: This patient is a 68 year old male with a history of severe depression requiring ECT in the past.  However he now continues to do well on his current regimen.  He will continue lorazepam  0.5 mg 3 times daily for anxiety, Paxil  20 mg at bedtime for depression, Seroquel  50 mg at bedtime for mood stabilization, trazodone  50 mg at bedtime for sleep, Effexor  XR 75 mg daily for depression and Depakote  ER 500 mg at bedtime for mood stabilization.  He will return to see me in 6 months  Collaboration of Care: Collaboration of Care: Primary Care Provider AEB notes are shared with PCP on the epic system  Patient/Guardian was advised Release of Information must be obtained prior to any record release in order to collaborate their care with an outside provider. Patient/Guardian was advised if they have not already done so to contact the registration department to sign all necessary forms in order for us  to release information regarding their care.   Consent: Patient/Guardian gives verbal consent for treatment and assignment of benefits for  services provided during this visit. Patient/Guardian expressed understanding and agreed to proceed.    Lawrence Annas, MD 10/30/2023, 1:54 PM

## 2023-11-02 ENCOUNTER — Other Ambulatory Visit: Payer: Self-pay

## 2023-11-04 ENCOUNTER — Other Ambulatory Visit: Payer: Self-pay

## 2023-11-04 ENCOUNTER — Other Ambulatory Visit (HOSPITAL_COMMUNITY): Payer: Self-pay

## 2023-11-05 ENCOUNTER — Other Ambulatory Visit (HOSPITAL_COMMUNITY): Payer: Self-pay

## 2023-11-05 ENCOUNTER — Ambulatory Visit (INDEPENDENT_AMBULATORY_CARE_PROVIDER_SITE_OTHER): Admitting: Family Medicine

## 2023-11-05 ENCOUNTER — Encounter: Payer: Self-pay | Admitting: Family Medicine

## 2023-11-05 VITALS — BP 118/74 | HR 76 | Temp 97.4°F | Ht 70.0 in | Wt 267.0 lb

## 2023-11-05 DIAGNOSIS — B9689 Other specified bacterial agents as the cause of diseases classified elsewhere: Secondary | ICD-10-CM

## 2023-11-05 DIAGNOSIS — J208 Acute bronchitis due to other specified organisms: Secondary | ICD-10-CM

## 2023-11-05 MED ORDER — AZITHROMYCIN 250 MG PO TABS
ORAL_TABLET | ORAL | 0 refills | Status: DC
  Filled 2023-11-05: qty 6, 5d supply, fill #0

## 2023-11-05 MED ORDER — PROMETHAZINE-DM 6.25-15 MG/5ML PO SYRP
5.0000 mL | ORAL_SOLUTION | Freq: Four times a day (QID) | ORAL | 0 refills | Status: DC | PRN
  Filled 2023-11-05: qty 240, 12d supply, fill #0

## 2023-11-05 NOTE — Progress Notes (Signed)
 Subjective:  Patient ID: Lawrence Bennett, male    DOB: 03/14/1956  Age: 68 y.o. MRN: [id number]  CC: sick (Sick for 2 weeks.  Still has dry cough, hoarse voice, leg cramps, sore neck, some headaches, feels weak and fatigued. /NO GI and NO FEVER. )   HPI Lawrence Bennett presents for losing voice, tired, neck aches, leg cramps.  He has a dry cough that is profuse and deep.  No dyspnea.     05/22/2023    3:20 PM 10/31/2022    1:02 PM 07/31/2022    2:06 PM  Depression screen PHQ 2/9  Decreased Interest 0  0  Down, Depressed, Hopeless 0  0  PHQ - 2 Score 0  0  Altered sleeping 0  0  Tired, decreased energy 0  0  Change in appetite 0  0  Feeling bad or failure about yourself  0  0  Trouble concentrating 0  0  Moving slowly or fidgety/restless 0  0  Suicidal thoughts 0  0  PHQ-9 Score 0  0  Difficult doing work/chores Not difficult at all  Not difficult at all     Information is confidential and restricted. Go to Review Flowsheets to unlock data.    History Lawrence Bennett has a past medical history of AAA (abdominal aortic aneurysm) (HCC), Anxiety, Atherosclerosis of aorta (HCC), Atrial contractions, premature, Depression, Dyslipidemia, Dyspnea, Elevated glucose (11/11/2015), Hyperlipidemia, Mild hypertension, Stroke (HCC), and Urinary hesitancy.   He has a past surgical history that includes Colon resection (2012); Abdominal aortic endovascular stent graft (Bilateral, 08/21/2021); and Ultrasound guidance for vascular access (Bilateral, 08/21/2021).   His family history includes Bipolar disorder in his father; COPD in his mother; Depression in his sister; Hypertension in his sister; Stroke in his father.He reports that he quit smoking about 10 years ago. His smoking use included cigarettes. He started smoking about 29 years ago. He has a 3.8 pack-year smoking history. He has never used smokeless tobacco. He reports that he does not drink alcohol and does not use drugs.    ROS Review of  Systems  Constitutional:  Negative for chills, diaphoresis and fever.  HENT:  Positive for congestion. Negative for sore throat.   Respiratory:  Positive for cough. Negative for shortness of breath, wheezing and stridor.   Cardiovascular:  Negative for chest pain and palpitations.  Gastrointestinal:  Negative for nausea and vomiting.  Skin:  Negative for rash.  Neurological:  Negative for weakness.    Objective:  BP 118/74   Pulse 76   Temp (!) 97.4 F (36.3 C)   Ht 5\' 10"  (1.778 m)   Wt 267 lb (121.1 kg)   SpO2 95%   BMI 38.31 kg/m   BP Readings from Last 3 Encounters:  11/05/23 118/74  06/24/23 (!) 154/88  05/23/23 138/83    Wt Readings from Last 3 Encounters:  11/05/23 267 lb (121.1 kg)  06/24/23 265 lb (120.2 kg)  05/23/23 265 lb 9.6 oz (120.5 kg)     Physical Exam Constitutional:      Appearance: Normal appearance. He is well-developed.  HENT:     Head: Normocephalic and atraumatic.     Right Ear: Tympanic membrane and external ear normal. No decreased hearing noted.     Left Ear: Tympanic membrane and external ear normal. No decreased hearing noted.     Nose: Mucosal edema present.     Right Sinus: No frontal sinus tenderness.     Left Sinus: No frontal  sinus tenderness.     Mouth/Throat:     Pharynx: No oropharyngeal exudate or posterior oropharyngeal erythema.  Neck:     Meningeal: Brudzinski's sign absent.  Pulmonary:     Effort: No respiratory distress.     Breath sounds: Normal breath sounds.  Lymphadenopathy:     Head:     Right side of head: No preauricular adenopathy.     Left side of head: No preauricular adenopathy.     Cervical:     Right cervical: No superficial cervical adenopathy.    Left cervical: No superficial cervical adenopathy.  Neurological:     Mental Status: He is alert and oriented to person, place, and time.      Assessment & Plan:  Acute bacterial bronchitis -     Azithromycin ; Take 2 tablets by mouth on day 1 then 1  tablet by mouth on days 2-5  Dispense: 6 each; Refill: 0 -     Promethazine-DM; Take 5 mLs by mouth 4 (four) times daily as needed for cough.  Dispense: 240 mL; Refill: 0     Follow-up: Return if symptoms worsen or fail to improve.  Lawrence Bennett, M.D.

## 2023-11-10 ENCOUNTER — Other Ambulatory Visit: Payer: Self-pay

## 2023-11-10 ENCOUNTER — Encounter (HOSPITAL_COMMUNITY): Payer: Self-pay | Admitting: *Deleted

## 2023-11-10 ENCOUNTER — Inpatient Hospital Stay (HOSPITAL_COMMUNITY)
Admission: EM | Admit: 2023-11-10 | Discharge: 2023-11-14 | DRG: 641 | Disposition: A | Discharge status: Home / Self Care | Attending: Internal Medicine | Admitting: Internal Medicine

## 2023-11-10 ENCOUNTER — Emergency Department (HOSPITAL_COMMUNITY)

## 2023-11-10 DIAGNOSIS — Z818 Family history of other mental and behavioral disorders: Secondary | ICD-10-CM

## 2023-11-10 DIAGNOSIS — N179 Acute kidney failure, unspecified: Secondary | ICD-10-CM | POA: Diagnosis present

## 2023-11-10 DIAGNOSIS — E869 Volume depletion, unspecified: Secondary | ICD-10-CM | POA: Diagnosis present

## 2023-11-10 DIAGNOSIS — Z7985 Long-term (current) use of injectable non-insulin antidiabetic drugs: Secondary | ICD-10-CM

## 2023-11-10 DIAGNOSIS — N400 Enlarged prostate without lower urinary tract symptoms: Secondary | ICD-10-CM | POA: Diagnosis present

## 2023-11-10 DIAGNOSIS — F419 Anxiety disorder, unspecified: Secondary | ICD-10-CM | POA: Diagnosis present

## 2023-11-10 DIAGNOSIS — E66812 Obesity, class 2: Secondary | ICD-10-CM | POA: Diagnosis present

## 2023-11-10 DIAGNOSIS — W19XXXA Unspecified fall, initial encounter: Principal | ICD-10-CM | POA: Diagnosis present

## 2023-11-10 DIAGNOSIS — F319 Bipolar disorder, unspecified: Secondary | ICD-10-CM | POA: Diagnosis present

## 2023-11-10 DIAGNOSIS — S7001XA Contusion of right hip, initial encounter: Secondary | ICD-10-CM | POA: Diagnosis present

## 2023-11-10 DIAGNOSIS — Z8249 Family history of ischemic heart disease and other diseases of the circulatory system: Secondary | ICD-10-CM | POA: Diagnosis not present

## 2023-11-10 DIAGNOSIS — S80211A Abrasion, right knee, initial encounter: Secondary | ICD-10-CM | POA: Diagnosis present

## 2023-11-10 DIAGNOSIS — Z7902 Long term (current) use of antithrombotics/antiplatelets: Secondary | ICD-10-CM

## 2023-11-10 DIAGNOSIS — F331 Major depressive disorder, recurrent, moderate: Secondary | ICD-10-CM | POA: Diagnosis present

## 2023-11-10 DIAGNOSIS — M25551 Pain in right hip: Secondary | ICD-10-CM | POA: Diagnosis not present

## 2023-11-10 DIAGNOSIS — R7989 Other specified abnormal findings of blood chemistry: Secondary | ICD-10-CM | POA: Diagnosis present

## 2023-11-10 DIAGNOSIS — S80212A Abrasion, left knee, initial encounter: Secondary | ICD-10-CM | POA: Diagnosis present

## 2023-11-10 DIAGNOSIS — Z823 Family history of stroke: Secondary | ICD-10-CM | POA: Diagnosis not present

## 2023-11-10 DIAGNOSIS — Z825 Family history of asthma and other chronic lower respiratory diseases: Secondary | ICD-10-CM | POA: Diagnosis not present

## 2023-11-10 DIAGNOSIS — R296 Repeated falls: Secondary | ICD-10-CM | POA: Diagnosis not present

## 2023-11-10 DIAGNOSIS — Z87891 Personal history of nicotine dependence: Secondary | ICD-10-CM | POA: Diagnosis not present

## 2023-11-10 DIAGNOSIS — Z66 Do not resuscitate: Secondary | ICD-10-CM | POA: Diagnosis present

## 2023-11-10 DIAGNOSIS — I10 Essential (primary) hypertension: Secondary | ICD-10-CM | POA: Diagnosis not present

## 2023-11-10 DIAGNOSIS — E785 Hyperlipidemia, unspecified: Secondary | ICD-10-CM | POA: Diagnosis present

## 2023-11-10 DIAGNOSIS — Y92007 Garden or yard of unspecified non-institutional (private) residence as the place of occurrence of the external cause: Secondary | ICD-10-CM | POA: Diagnosis not present

## 2023-11-10 DIAGNOSIS — E871 Hypo-osmolality and hyponatremia: Secondary | ICD-10-CM | POA: Diagnosis not present

## 2023-11-10 DIAGNOSIS — R29818 Other symptoms and signs involving the nervous system: Secondary | ICD-10-CM | POA: Diagnosis not present

## 2023-11-10 DIAGNOSIS — K5900 Constipation, unspecified: Secondary | ICD-10-CM | POA: Diagnosis present

## 2023-11-10 DIAGNOSIS — Z8673 Personal history of transient ischemic attack (TIA), and cerebral infarction without residual deficits: Secondary | ICD-10-CM | POA: Diagnosis not present

## 2023-11-10 DIAGNOSIS — M160 Bilateral primary osteoarthritis of hip: Secondary | ICD-10-CM | POA: Diagnosis not present

## 2023-11-10 DIAGNOSIS — Z79899 Other long term (current) drug therapy: Secondary | ICD-10-CM

## 2023-11-10 DIAGNOSIS — Z6838 Body mass index (BMI) 38.0-38.9, adult: Secondary | ICD-10-CM | POA: Diagnosis not present

## 2023-11-10 DIAGNOSIS — Z8679 Personal history of other diseases of the circulatory system: Secondary | ICD-10-CM

## 2023-11-10 LAB — COMPREHENSIVE METABOLIC PANEL WITH GFR
ALT: 20 U/L (ref 0–44)
AST: 26 U/L (ref 15–41)
Albumin: 3.6 g/dL (ref 3.5–5.0)
Alkaline Phosphatase: 61 U/L (ref 38–126)
Anion gap: 14 (ref 5–15)
BUN: 14 mg/dL (ref 8–23)
CO2: 21 mmol/L — ABNORMAL LOW (ref 22–32)
Calcium: 9 mg/dL (ref 8.9–10.3)
Chloride: 84 mmol/L — ABNORMAL LOW (ref 98–111)
Creatinine, Ser: 1.33 mg/dL — ABNORMAL HIGH (ref 0.61–1.24)
GFR, Estimated: 58 mL/min — ABNORMAL LOW (ref >60)
Glucose, Bld: 109 mg/dL — ABNORMAL HIGH (ref 70–99)
Potassium: 4.7 mmol/L (ref 3.5–5.1)
Sodium: 119 mmol/L — CL (ref 135–145)
Total Bilirubin: 1 mg/dL (ref 0.0–1.2)
Total Protein: 6.4 g/dL — ABNORMAL LOW (ref 6.5–8.1)

## 2023-11-10 LAB — OSMOLALITY, URINE: Osmolality, Ur: 484 mosm/kg (ref 300–900)

## 2023-11-10 LAB — CBC WITH DIFFERENTIAL/PLATELET
Abs Immature Granulocytes: 0.05 10*3/uL (ref 0.00–0.07)
Basophils Absolute: 0.1 10*3/uL (ref 0.0–0.1)
Basophils Relative: 1 %
Eosinophils Absolute: 0.1 10*3/uL (ref 0.0–0.5)
Eosinophils Relative: 1 %
HCT: 37.9 % — ABNORMAL LOW (ref 39.0–52.0)
Hemoglobin: 13.2 g/dL (ref 13.0–17.0)
Immature Granulocytes: 1 %
Lymphocytes Relative: 20 %
Lymphs Abs: 1.8 10*3/uL (ref 0.7–4.0)
MCH: 29.9 pg (ref 26.0–34.0)
MCHC: 34.8 g/dL (ref 30.0–36.0)
MCV: 85.7 fL (ref 80.0–100.0)
Monocytes Absolute: 0.8 10*3/uL (ref 0.1–1.0)
Monocytes Relative: 9 %
Neutro Abs: 6.1 10*3/uL (ref 1.7–7.7)
Neutrophils Relative %: 68 %
Platelets: 272 10*3/uL (ref 150–400)
RBC: 4.42 MIL/uL (ref 4.22–5.81)
RDW: 13.6 % (ref 11.5–15.5)
WBC: 8.9 10*3/uL (ref 4.0–10.5)
nRBC: 0 % (ref 0.0–0.2)

## 2023-11-10 LAB — URINALYSIS, ROUTINE W REFLEX MICROSCOPIC
Bilirubin Urine: NEGATIVE
Glucose, UA: NEGATIVE mg/dL
Hgb urine dipstick: NEGATIVE
Ketones, ur: 5 mg/dL — AB
Leukocytes,Ua: NEGATIVE
Nitrite: NEGATIVE
Protein, ur: NEGATIVE mg/dL
Specific Gravity, Urine: 1.018 (ref 1.005–1.030)
pH: 5 (ref 5.0–8.0)

## 2023-11-10 LAB — SODIUM: Sodium: 119 mmol/L — CL (ref 135–145)

## 2023-11-10 LAB — NA AND K (SODIUM & POTASSIUM), RAND UR
Potassium Urine: 64 mmol/L
Sodium, Ur: 16 mmol/L

## 2023-11-10 LAB — OSMOLALITY: Osmolality: 252 mosm/kg — ABNORMAL LOW (ref 275–295)

## 2023-11-10 MED ORDER — ENOXAPARIN SODIUM 40 MG/0.4ML IJ SOSY: 40.0000 mg | PREFILLED_SYRINGE | INTRAMUSCULAR | Status: DC

## 2023-11-10 MED ORDER — ONDANSETRON HCL 4 MG PO TABS: 4.0000 mg | ORAL_TABLET | Freq: Four times a day (QID) | ORAL | Status: DC | PRN

## 2023-11-10 MED ORDER — ENOXAPARIN SODIUM 60 MG/0.6ML IJ SOSY
60.0000 mg | PREFILLED_SYRINGE | INTRAMUSCULAR | Status: DC
  Administered 2023-11-11 – 2023-11-14 (×4): 60 mg via SUBCUTANEOUS
  Filled 2023-11-10 (×4): qty 0.6

## 2023-11-10 MED ORDER — LORAZEPAM 0.5 MG PO TABS
0.5000 mg | ORAL_TABLET | Freq: Three times a day (TID) | ORAL | Status: DC
Start: 2023-11-10 — End: 2023-11-14
  Administered 2023-11-10 – 2023-11-14 (×11): 0.5 mg via ORAL
  Filled 2023-11-10 (×11): qty 1

## 2023-11-10 MED ORDER — ACETAMINOPHEN 650 MG RE SUPP: 650.0000 mg | Freq: Four times a day (QID) | RECTAL | Status: DC | PRN

## 2023-11-10 MED ORDER — ACETAMINOPHEN 500 MG PO TABS
1000.0000 mg | ORAL_TABLET | Freq: Once | ORAL | Status: AC
  Administered 2023-11-10: 1000 mg via ORAL
  Filled 2023-11-10: qty 2

## 2023-11-10 MED ORDER — HYDROCODONE-ACETAMINOPHEN 5-325 MG PO TABS
1.0000 | ORAL_TABLET | Freq: Four times a day (QID) | ORAL | Status: AC | PRN
  Administered 2023-11-10: 1 via ORAL
  Administered 2023-11-11: 2 via ORAL
  Administered 2023-11-11 – 2023-11-12 (×2): 1 via ORAL
  Filled 2023-11-10 (×2): qty 2
  Filled 2023-11-10 (×2): qty 1

## 2023-11-10 MED ORDER — SODIUM CHLORIDE 0.9 % IV SOLN: INTRAVENOUS | Status: DC

## 2023-11-10 MED ORDER — ATORVASTATIN CALCIUM 80 MG PO TABS
80.0000 mg | ORAL_TABLET | Freq: Every day | ORAL | Status: DC
Start: 2023-11-11 — End: 2023-11-14
  Administered 2023-11-11 – 2023-11-14 (×4): 80 mg via ORAL
  Filled 2023-11-10 (×4): qty 1

## 2023-11-10 MED ORDER — SENNA 8.6 MG PO TABS
1.0000 | ORAL_TABLET | Freq: Two times a day (BID) | ORAL | Status: DC
  Administered 2023-11-10 – 2023-11-14 (×8): 8.6 mg via ORAL
  Filled 2023-11-10 (×8): qty 1

## 2023-11-10 MED ORDER — TRAZODONE HCL 50 MG PO TABS
50.0000 mg | ORAL_TABLET | Freq: Every day | ORAL | Status: DC
  Administered 2023-11-10 – 2023-11-13 (×4): 50 mg via ORAL
  Filled 2023-11-10 (×4): qty 1

## 2023-11-10 MED ORDER — TAMSULOSIN HCL 0.4 MG PO CAPS
0.4000 mg | ORAL_CAPSULE | Freq: Every day | ORAL | Status: DC
Start: 2023-11-11 — End: 2023-11-14
  Administered 2023-11-11 – 2023-11-14 (×4): 0.4 mg via ORAL
  Filled 2023-11-10 (×5): qty 1

## 2023-11-10 MED ORDER — IRBESARTAN 300 MG PO TABS
300.0000 mg | ORAL_TABLET | Freq: Every day | ORAL | Status: DC
  Administered 2023-11-11 – 2023-11-12 (×2): 300 mg via ORAL
  Filled 2023-11-10 (×2): qty 1

## 2023-11-10 MED ORDER — CLOPIDOGREL BISULFATE 75 MG PO TABS
75.0000 mg | ORAL_TABLET | Freq: Every day | ORAL | Status: DC
  Administered 2023-11-11 – 2023-11-14 (×4): 75 mg via ORAL
  Filled 2023-11-10 (×4): qty 1

## 2023-11-10 MED ORDER — AMLODIPINE BESYLATE 5 MG PO TABS
10.0000 mg | ORAL_TABLET | Freq: Every day | ORAL | Status: DC
Start: 2023-11-11 — End: 2023-11-12
  Administered 2023-11-11 – 2023-11-12 (×2): 10 mg via ORAL
  Filled 2023-11-10 (×2): qty 1

## 2023-11-10 MED ORDER — ONDANSETRON HCL 4 MG/2ML IJ SOLN: 4.0000 mg | Freq: Four times a day (QID) | INTRAMUSCULAR | Status: DC | PRN

## 2023-11-10 MED ORDER — LIDOCAINE 5 % EX PTCH
1.0000 | MEDICATED_PATCH | Freq: Once | CUTANEOUS | Status: AC
  Administered 2023-11-10: 1 via TRANSDERMAL
  Filled 2023-11-10: qty 1

## 2023-11-10 MED ORDER — SODIUM CHLORIDE 0.9% FLUSH
3.0000 mL | Freq: Two times a day (BID) | INTRAVENOUS | Status: DC
  Administered 2023-11-11 – 2023-11-13 (×6): 3 mL via INTRAVENOUS

## 2023-11-10 MED ORDER — POLYETHYLENE GLYCOL 3350 17 G PO PACK
17.0000 g | PACK | Freq: Every day | ORAL | Status: DC | PRN
  Administered 2023-11-11: 17 g via ORAL
  Filled 2023-11-10: qty 1

## 2023-11-10 MED ORDER — ACETAMINOPHEN 325 MG PO TABS
650.0000 mg | ORAL_TABLET | Freq: Four times a day (QID) | ORAL | Status: DC | PRN
  Administered 2023-11-12 – 2023-11-14 (×3): 650 mg via ORAL
  Filled 2023-11-10 (×3): qty 2

## 2023-11-10 NOTE — ED Notes (Signed)
 Chriscelle with [facility] notified that cardiac monitoring has been discontinued.

## 2023-11-10 NOTE — ED Notes (Signed)
 [facility] called and notified

## 2023-11-10 NOTE — H&P (Signed)
 History and Physical    [name]:[id number] DOB: 1955/10/30 DOA: 11/10/2023  PCP: Eliodoro Guerin, DO  Patient coming from: Home  I have personally briefly reviewed patient's old medical records in [facility]  Chief Complaint: Fall at home  HPI: Lawrence Bennett is a 68 y.o. male with medical history significant for history of CVA on Plavix , HTN, HLD, AAA s/p EVAR 08/2021, BPD, depression/anxiety, hard of hearing who presented to the ED for evaluation after he fell at home.  Patient states that he started Wegovy  1 week ago for weight loss.  He says since then he has been feeling off.  He says appetite has been low and he has not been eating as usual.  He says that he has been falling due to feeling that his legs give out once in a while.  He had 2 falls yesterday.  He says 1 of these falls he landed on his right buttocks and was having significant right hip pain.  He scraped his left knee during 1 of these falls as well.  He did not hit his head or lose consciousness.  He denied any lightheadedness, dizziness, chest pain, dyspnea, nausea, vomiting, diarrhea.  He reports feeling constipated with last bowel movement 3 days ago.  He reports decreased urine output than expected.  ED Course  Labs/Imaging on admission: I have personally reviewed following labs and imaging studies.  Initial vitals showed BP 133/75, pulse 86, RR 18, temp 98.2 F, SpO2 95% on room air.  Labs showed sodium 119, potassium 4.7, chloride 84, bicarb 21, BUN 14, creatinine 1.33, serum glucose 109, LFTs within normal limits, WBC 8.9, hemoglobin 13.2, platelets 272.  Right hip x-ray negative for acute osseous abnormality.  UA, urine osmolality and sodium, and serum osmolality test ordered and pending collection.  The hospitalist service was consulted to admit.  Review of Systems: All systems reviewed and are negative except as documented in history of present illness above.   Past Medical History:   Diagnosis Date   AAA (abdominal aortic aneurysm) (HCC)    Anxiety    Atherosclerosis of aorta (HCC)    Atrial contractions, premature    Depression    Dyslipidemia    Dyspnea    Elevated glucose 11/11/2015   Hyperlipidemia    Mild hypertension    Stroke [facility])    Urinary hesitancy     Past Surgical History:  Procedure Laterality Date   ABDOMINAL AORTIC ENDOVASCULAR STENT GRAFT Bilateral 08/21/2021   Procedure: ENDOVASCULAR AORTIC STENT GRAFT REPAIR;  Surgeon: Kayla Part, MD;  Location: [facility] OR;  Service: Vascular;  Laterality: Bilateral;   COLON RESECTION  2012   ULTRASOUND GUIDANCE FOR VASCULAR ACCESS Bilateral 08/21/2021   Procedure: ULTRASOUND GUIDANCE FOR VASCULAR ACCESS;  Surgeon: Kayla Part, MD;  Location: [facility] OR;  Service: Vascular;  Laterality: Bilateral;    Social History: Social History   Tobacco Use   Smoking status: Former    Current packs/day: 0.00    Average packs/day: 0.2 packs/day for 19.0 years (3.8 ttl pk-yrs)    Types: Cigarettes    Start date: 22    Quit date: 2015    Years since quitting: 10.3   Smokeless tobacco: Never  Vaping Use   Vaping status: Never Used  Substance Use Topics   Alcohol use: No    Alcohol/week: 0.0 standard drinks of alcohol   Drug use: No   No Known Allergies  Family History  Problem Relation Age of  Onset   COPD Mother    Stroke Father    Bipolar disorder Father    Hypertension Sister    Depression Sister    Colon cancer Neg Hx    Esophageal cancer Neg Hx    Inflammatory bowel disease Neg Hx    Liver disease Neg Hx    Rectal cancer Neg Hx    Pancreatic cancer Neg Hx    Stomach cancer Neg Hx      Prior to Admission medications   Medication Sig Start Date End Date Taking? Authorizing Provider  atorvastatin  (LIPITOR ) 80 MG tablet Take 1 tablet (80 mg total) by mouth daily. 08/16/23     atorvastatin  (LIPITOR ) 80 MG tablet 1 tab by mouth daily 10/03/23     azithromycin  (ZITHROMAX  Z-PAK) 250 MG tablet Take 2  tablets by mouth on day 1 then 1 tablet by mouth on days 2-5 11/05/23   Roise Cleaver, MD  clopidogrel  (PLAVIX ) 75 MG tablet Take 1 tablet by mouth once daily. 10/18/22   Jhonny Moss, MD  clopidogrel  (PLAVIX ) 75 MG tablet Take 1 tablet (75 mg total) by mouth daily. 08/16/23     divalproex  (DEPAKOTE  ER) 500 MG 24 hr tablet Take 1 tablet (500 mg) by mouth at bedtime. 10/30/23   Alysia Bachelor, MD  LORazepam  (ATIVAN ) 0.5 MG tablet Take 1 tablet (0.5 mg) by mouth 3 times daily. 10/30/23   Alysia Bachelor, MD  Olmesartan -amLODIPine -HCTZ 40-10-12.5 MG TABS Take 1 tablet by mouth daily. 07/29/23     Olmesartan -amLODIPine -HCTZ 40-10-12.5 MG TABS Take 1 tablet by mouth daily. Patient not taking: Reported on 11/05/2023 08/16/23     Olmesartan -amLODIPine -HCTZ 40-10-12.5 MG TABS 1 tab by mouth daily 10/03/23     oxybutynin  (DITROPAN  XL) 5 MG 24 hr tablet Take 1 tablet (5 mg total) by mouth at bedtime. For overactive bladder 06/24/23   Vicky Grange M, DO  PARoxetine  (PAXIL ) 20 MG tablet Take 1 tablet (20 mg total) by mouth at bedtime. 10/30/23   Alysia Bachelor, MD  promethazine -dextromethorphan (PROMETHAZINE -DM) 6.25-15 MG/5ML syrup Take 5 mLs by mouth 4 (four) times daily as needed for cough. 11/05/23   Roise Cleaver, MD  QUEtiapine  (SEROQUEL ) 50 MG tablet Take 1 tablet (50 mg total) by mouth at bedtime. 10/30/23   Alysia Bachelor, MD  Semaglutide , 1 MG/DOSE, (OZEMPIC , 1 MG/DOSE,) 4 MG/3ML SOPN Inject 1 mg into the skin once a week. 10/10/23     Semaglutide -Weight Management (WEGOVY ) 0.25 MG/0.5ML SOAJ Inject 0.25 mg into the skin once a week. Patient not taking: Reported on 11/05/2023 08/16/23     Semaglutide -Weight Management (WEGOVY ) 0.25 MG/0.5ML SOAJ Inject 0.25 mg into the skin once a week. Administer weeks 1-4 of therapy. Patient not taking: Reported on 11/05/2023 10/03/23     tamsulosin  (FLOMAX ) 0.4 MG CAPS capsule Take 1 capsule (0.4 mg total) by mouth in the morning and at bedtime. 09/23/23   Eliodoro Guerin, DO  traZODone  (DESYREL ) 50 MG tablet Take 1 tablet (50 mg total) by mouth at bedtime. 10/30/23   Alysia Bachelor, MD  venlafaxine  XR (EFFEXOR -XR) 75 MG 24 hr capsule Take 1 capsule (75 mg total) by mouth daily with breakfast. 10/30/23   Alysia Bachelor, MD  vitamin C (ASCORBIC ACID) 500 MG tablet Take 500 mg by mouth daily.    [provider]  zinc gluconate 50 MG tablet Take 50 mg by mouth daily.    [provider]    Physical Exam: Vitals:  11/10/23 1800 11/10/23 1815 11/10/23 1830 11/10/23 1849  BP: 138/83 124/80  123/84  Pulse: 71 70 75 70  Resp: 18 (!) 26 18 17   Temp:    98.2 F (36.8 C)  TempSrc:    Oral  SpO2: 97% 99% 100% 99%  Weight:      Height:       Constitutional: Resting in bed, NAD, calm, comfortable Eyes: EOMI, lids and conjunctivae normal ENMT: Very hard of hearing, using hearing aid.  Mucous membranes are moist. Posterior pharynx clear of any exudate or lesions.Normal dentition.  Neck: normal, supple, no masses. Respiratory: clear to auscultation bilaterally, no wheezing, no crackles. Normal respiratory effort. No accessory muscle use.  Cardiovascular: Regular rate and rhythm, no murmurs / rubs / gallops. No extremity edema. 2+ pedal pulses. Abdomen: no tenderness, no masses palpated. Musculoskeletal: no clubbing / cyanosis. No joint deformity upper and lower extremities. Good ROM, no contractures. Normal muscle tone.  Skin: Abrasion left knee. No induration Neurologic: Sensation intact. Strength 5/5 in all 4.  Psychiatric: Normal judgment and insight. Alert and oriented x 3. Normal mood.   EKG: Personally reviewed. Sinus rhythm, rate 74, no acute ischemic changes.  Assessment/Plan Principal Problem:   Hyponatremia Active Problems:   Essential hypertension   Anxiety   Major depressive disorder, recurrent episode, moderate (HCC)   BPH (benign prostatic hyperplasia)   HLD (hyperlipidemia)   History of stroke   Lawrence Bennett is a 68  y.o. male with medical history significant for history of CVA on Plavix , HTN, HLD, AAA s/p EVAR 08/2021, BPD, depression/anxiety, hard of hearing who is admitted with hyponatremia.  Assessment and Plan: Hyponatremia: Sodium 119 on admission, previous 140 in December.  Unclear etiology, has had decreased appetite and oral intake over the last week since starting Wegovy .  Also on multiple medications which could contribute. - Serum osmolality pending, urine osmolality 44, urine sodium 16 - Holding Paxil , Seroquel , Effexor  XR, Depakote , and HCTZ for now - Start IV NS@100  mL/hour for now, repeat sodium level tonight  Bipolar disorder/depression/anxiety: Holding Paxil , Seroquel , Effexor , Depakote  as above for now.  Continue home Ativan  0.5 mg TID and trazodone  nightly.  Falls at home: Several falls at home without injury.  No focal deficits, dizziness, chest pain, or palpitations.  Suspect deconditioning.  Request PT/OT eval.  Hypertension: Continue olmesartan  and amlodipine .  Holding HCTZ.  History of CVA: Continue Plavix  and atorvastatin .  Hyperlipidemia: Continue atorvastatin .  BPH: Continue Flomax .   DVT prophylaxis: Lovenox Code Status:   Code Status: Limited: Do not attempt resuscitation (DNR) -DNR-LIMITED -Do Not Intubate/DNI   confirmed with patient on admission. Family Communication: Spouse at bedside Disposition Plan: From home, dispo pending clinical progress Consults called: None Severity of Illness: The appropriate patient status for this patient is INPATIENT. Inpatient status is judged to be reasonable and necessary in order to provide the required intensity of service to ensure the patient's safety. The patient's presenting symptoms, physical exam findings, and initial radiographic and laboratory data in the context of their chronic comorbidities is felt to place them at high risk for further clinical deterioration. Furthermore, it is not anticipated that the patient will be  medically stable for discharge from the hospital within 2 midnights of admission.   * I certify that at the point of admission it is my clinical judgment that the patient will require inpatient hospital care spanning beyond 2 midnights from the point of admission due to high intensity of service, high risk for further  deterioration and high frequency of surveillance required.Edith Gores MD [facility]  If 7PM-7AM, please contact night-coverage [email or web address]  11/10/2023, 8:59 PM

## 2023-11-10 NOTE — ED Provider Notes (Signed)
 [place] EMERGENCY DEPARTMENT AT [facility] Provider Note  History  Chief Complaint:  Fall  The history is provided by the patient, medical records and the spouse.  Fall     Lawrence Bennett is a 68 y.o. male with a history of dyslipidemia, hypertension who presents to the emergency department after a fall in the yard.  He reports that he fell x 2 in the yard yesterday.  He states he did not have syncope, abdominal pain, chest pain or shortness of breath.  He states that he got startled and fell.  He states that he did not hit his head.  Wife at the bedside also provides collateral history who states that the patient has not hit his head.  He is mentating appropriately.  He is having pain on his right hip from falling.  On medical medical record review patient is not currently on blood thinners.  He confirms this.  He is on Plavix .  Past Medical History:  Diagnosis Date   AAA (abdominal aortic aneurysm) (HCC)    Anxiety    Atherosclerosis of aorta (HCC)    Atrial contractions, premature    Depression    Dyslipidemia    Dyspnea    Elevated glucose 11/11/2015   Hyperlipidemia    Mild hypertension    Stroke [facility])    Urinary hesitancy     Past Surgical History:  Procedure Laterality Date   ABDOMINAL AORTIC ENDOVASCULAR STENT GRAFT Bilateral 08/21/2021   Procedure: ENDOVASCULAR AORTIC STENT GRAFT REPAIR;  Surgeon: Lawrence Part, MD;  Location: [facility] OR;  Service: Vascular;  Laterality: Bilateral;   COLON RESECTION  2012   ULTRASOUND GUIDANCE FOR VASCULAR ACCESS Bilateral 08/21/2021   Procedure: ULTRASOUND GUIDANCE FOR VASCULAR ACCESS;  Surgeon: Lawrence Part, MD;  Location: [facility] OR;  Service: Vascular;  Laterality: Bilateral;    Family History  Problem Relation Age of Onset   COPD Mother    Stroke Father    Bipolar disorder Father    Hypertension Sister    Depression Sister    Colon cancer Neg Hx    Esophageal cancer Neg Hx    Inflammatory bowel disease Neg  Hx    Liver disease Neg Hx    Rectal cancer Neg Hx    Pancreatic cancer Neg Hx    Stomach cancer Neg Hx     Social History   Tobacco Use   Smoking status: Former    Current packs/day: 0.00    Average packs/day: 0.2 packs/day for 19.0 years (3.8 ttl pk-yrs)    Types: Cigarettes    Start date: 62    Quit date: 2015    Years since quitting: 10.3   Smokeless tobacco: Never  Vaping Use   Vaping status: Never Used  Substance Use Topics   Alcohol use: No    Alcohol/week: 0.0 standard drinks of alcohol   Drug use: No    Review of Systems  Review of Systems   Reviewed and documented in HPI if pertinent.   Physical Exam   ED Triage Vitals  Encounter Vitals Group     BP 11/10/23 1517 133/75     Systolic BP Percentile --      Diastolic BP Percentile --      Pulse Rate 11/10/23 1517 86     Resp 11/10/23 1517 18     Temp 11/10/23 1517 98.2 F (36.8 C)     Temp src --      SpO2 11/10/23 1517 95 %  Weight 11/10/23 1520 266 lb 15.6 oz (121.1 kg)     Height 11/10/23 1520 5\' 10"  (1.778 m)     Head Circumference --      Peak Flow --      Pain Score 11/10/23 1519 6     Pain Loc --      Pain Education --      Exclude from Growth Chart --      Physical Exam Vitals and nursing note reviewed.  Constitutional:      General: He is not in acute distress.    Appearance: He is well-developed.  HENT:     Head: Normocephalic and atraumatic.  Eyes:     Conjunctiva/sclera: Conjunctivae normal.  Cardiovascular:     Rate and Rhythm: Normal rate and regular rhythm.     Pulses:          Dorsalis pedis pulses are 2+ on the right side and 2+ on the left side.       Posterior tibial pulses are 2+ on the right side and 2+ on the left side.     Heart sounds: No murmur heard. Pulmonary:     Effort: Pulmonary effort is normal. No respiratory distress.     Breath sounds: Normal breath sounds.  Abdominal:     Palpations: Abdomen is soft.     Tenderness: There is no abdominal  tenderness.  Musculoskeletal:        General: No swelling.     Cervical back: Neck supple. No bony tenderness.     Thoracic back: No bony tenderness.     Lumbar back: No bony tenderness.     Comments: 5/5 strength in hip flexors bilaterally, 5/5 strength in knee extension and flexion bilaterally, 5/5 strength in dorsiflexion and plantarflexion bilaterally; intact sensation to the medial lower leg, lateral lower leg, medial upper leg and lateral upper leg  Bruising is noted to the right hip, abrasions to the right and left knees; patient has full range of motion without pain at bilateral knee joints he has no bony tenderness to the knees  Skin:    General: Skin is warm and dry.     Capillary Refill: Capillary refill takes less than 2 seconds.  Neurological:     Mental Status: He is alert.     Comments: Patient was able to walk in the room with minimal assistance by myself; he was able to make a turn and place all of his weight on his right lower extremity  Psychiatric:        Mood and Affect: Mood normal.      Procedures   Procedures  ED Course - Medical Decision Making  Brief Overview [name] is a 68 y.o. male who presents as per above.  I have reviewed the nursing documentation for past medical history, family history, and social history and agree.  I have reviewed the patient's vital signs. There are no abnormalities.  Initial Differential Diagnoses: I am primarily concerned for anemia, electrolyte abnormalities, chemical fall, hip fracture, dislocation, MSK sprain, abrasion.  Therapies: These medications and interventions were provided for the patient while in the ED.  Medications  lidocaine  (LIDODERM ) 5 % 1 patch (1 patch Transdermal Patch Applied 11/10/23 1744)  sodium chloride  flush (NS) 0.9 % injection 3 mL (3 mLs Intravenous Not Given 11/10/23 2109)  0.9 %  sodium chloride  infusion ( Intravenous New Bag/Given 11/10/23 2047)  acetaminophen  (TYLENOL ) tablet 650  mg (has no administration in time range)  Or  acetaminophen  (TYLENOL ) suppository 650 mg (has no administration in time range)  ondansetron  (ZOFRAN ) tablet 4 mg (has no administration in time range)    Or  ondansetron  (ZOFRAN ) injection 4 mg (has no administration in time range)  senna (SENOKOT) tablet 8.6 mg (8.6 mg Oral Given 11/10/23 2226)  polyethylene glycol (MIRALAX / GLYCOLAX) packet 17 g (has no administration in time range)  HYDROcodone -acetaminophen  (NORCO/VICODIN) 5-325 MG per tablet 1-2 tablet (1 tablet Oral Given 11/10/23 2226)  enoxaparin (LOVENOX) injection 60 mg (has no administration in time range)  atorvastatin  (LIPITOR ) tablet 80 mg (has no administration in time range)  clopidogrel  (PLAVIX ) tablet 75 mg (has no administration in time range)  LORazepam  (ATIVAN ) tablet 0.5 mg (0.5 mg Oral Given 11/10/23 2226)  tamsulosin  (FLOMAX ) capsule 0.4 mg (has no administration in time range)  traZODone  (DESYREL ) tablet 50 mg (50 mg Oral Given 11/10/23 2226)  irbesartan (AVAPRO) tablet 300 mg (has no administration in time range)  amLODipine  (NORVASC ) tablet 10 mg (has no administration in time range)  acetaminophen  (TYLENOL ) tablet 1,000 mg (1,000 mg Oral Given 11/10/23 1743)    Testing Results: On my interpretation labs are significant for : No anemia  Electrolytes wnl  On my interpretation imaging is significant for: XR without acute fracture or dislocation  EKG Interpretation Date/Time:  Sunday Nov 10 2023 16:59:57 EDT Ventricular Rate:  74 PR Interval:  192 QRS Duration:  106 QT Interval:  362 QTC Calculation: 402 R Axis:   67  Text Interpretation: Sinus rhythm no acute ST/T changes Confirmed by Jerilynn Montenegro [phone]) on 11/10/2023 5:04:38 PM   See the EMR for full details regarding lab and imaging results.   Medical Decision Making 68 year old male who presents the emergency department after a fall yesterday.  He has tenderness and bruising of the right hip.  He  has abrasions over the bilateral knees.  He has normal strength and sensation in all 4 extremities.  Normal pulses.  No bony tenderness to the bilateral knees.  X-rays were performed of the right hip.  There is no acute fractures.  Patient was able to walk in the room with minimal assistance.  He was able to place all of his body weight on his right lower extremity.  Therefore I do not feel that pelvic fractures are likely.  I discussed this with the family.  Do not feel that CT or MRI is necessary given his reassuring exam.  Patient does have abrasions to the bilateral knees however has no bony tenderness.  He has 5 out of 5 strength in all cardinal movements of the lower extremities.  He has a normal pulse.  He has normal sensation.  He has no spinal tenderness.  He is GCS 15.  No coordination difficulties.  Therefore do not feel that head imaging is warranted.  Patient is also not on blood thinners.  Laboratory studies and EKG were performed.  This was to evaluate for any underlying electrolyte abnormalities or anemia causing falls.  Hemoglobin within normal limits.  EKG reveals no signs of arrhythmia or ST changes.  Severe hyponatremia noted on laboratory studies.  Therefore we did contact hospital medicine for admission.  They will admit the patient to their service.  Problems Addressed: Fall, initial encounter: complicated acute illness or injury Hyponatremia: acute illness or injury that poses a threat to life or bodily functions  Amount and/or Complexity of Data Reviewed Labs: ordered.  Risk OTC drugs. Prescription drug management. Decision regarding hospitalization.     ###  All radiography studies, electrocardiograms, and laboratory data were personally reviewed by me and incorporated into my medical decision making. Impression   1. Fall, initial encounter   2. Hyponatremia      Note: Chief Executive Officer was used in the creation of this note.     Arminda Landmark, MD 11/10/23 1610    Jerilynn Montenegro, MD 11/11/23 1038

## 2023-11-10 NOTE — ED Notes (Signed)
 Correction rt hip not lt

## 2023-11-10 NOTE — ED Triage Notes (Signed)
 The pt fell in the yard yesterday x 2 he was startled no head injury  he is c/o pain in his rt hip and both his knees have abrasions

## 2023-11-10 NOTE — Hospital Course (Signed)
 Lawrence Bennett is a 68 y.o. male with medical history significant for history of CVA on Plavix , HTN, HLD, AAA s/p EVAR 08/2021, BPD, depression/anxiety, hard of hearing who is admitted with hyponatremia.

## 2023-11-11 ENCOUNTER — Inpatient Hospital Stay (HOSPITAL_COMMUNITY)

## 2023-11-11 DIAGNOSIS — E871 Hypo-osmolality and hyponatremia: Secondary | ICD-10-CM | POA: Diagnosis present

## 2023-11-11 LAB — BASIC METABOLIC PANEL WITH GFR
Anion gap: 11 (ref 5–15)
BUN: 13 mg/dL (ref 8–23)
CO2: 24 mmol/L (ref 22–32)
Calcium: 9.3 mg/dL (ref 8.9–10.3)
Chloride: 85 mmol/L — ABNORMAL LOW (ref 98–111)
Creatinine, Ser: 1.25 mg/dL — ABNORMAL HIGH (ref 0.61–1.24)
GFR, Estimated: >60 mL/min (ref >60)
Glucose, Bld: 93 mg/dL (ref 70–99)
Potassium: 4 mmol/L (ref 3.5–5.1)
Sodium: 120 mmol/L — ABNORMAL LOW (ref 135–145)

## 2023-11-11 LAB — HIV ANTIBODY (ROUTINE TESTING W REFLEX): HIV Screen 4th Generation wRfx: NONREACTIVE

## 2023-11-11 LAB — CBC
HCT: 38.4 % — ABNORMAL LOW (ref 39.0–52.0)
Hemoglobin: 13.5 g/dL (ref 13.0–17.0)
MCH: 30.1 pg (ref 26.0–34.0)
MCHC: 35.2 g/dL (ref 30.0–36.0)
MCV: 85.7 fL (ref 80.0–100.0)
Platelets: 265 10*3/uL (ref 150–400)
RBC: 4.48 MIL/uL (ref 4.22–5.81)
RDW: 13.9 % (ref 11.5–15.5)
WBC: 7.9 10*3/uL (ref 4.0–10.5)
nRBC: 0 % (ref 0.0–0.2)

## 2023-11-11 MED ORDER — PAROXETINE HCL 20 MG PO TABS
20.0000 mg | ORAL_TABLET | Freq: Every day | ORAL | Status: DC
  Administered 2023-11-11 – 2023-11-13 (×3): 20 mg via ORAL
  Filled 2023-11-11 (×3): qty 1

## 2023-11-11 MED ORDER — ACETAMINOPHEN 500 MG PO TABS: 1000.0000 mg | ORAL_TABLET | Freq: Once | ORAL | Status: DC

## 2023-11-11 MED ORDER — SODIUM CHLORIDE 0.9 % IV SOLN: INTRAVENOUS | Status: AC

## 2023-11-11 MED ORDER — DIVALPROEX SODIUM ER 500 MG PO TB24
500.0000 mg | ORAL_TABLET | Freq: Every day | ORAL | Status: DC
  Administered 2023-11-11 – 2023-11-13 (×3): 500 mg via ORAL
  Filled 2023-11-11 (×3): qty 1

## 2023-11-11 MED ORDER — SODIUM CHLORIDE 0.9 % IV BOLUS
500.0000 mL | Freq: Once | INTRAVENOUS | Status: AC
  Administered 2023-11-11: 500 mL via INTRAVENOUS

## 2023-11-11 MED ORDER — VENLAFAXINE HCL ER 75 MG PO CP24
75.0000 mg | ORAL_CAPSULE | Freq: Every day | ORAL | Status: DC
  Administered 2023-11-11 – 2023-11-14 (×4): 75 mg via ORAL
  Filled 2023-11-11 (×5): qty 1

## 2023-11-11 MED ORDER — QUETIAPINE FUMARATE 25 MG PO TABS
50.0000 mg | ORAL_TABLET | Freq: Every day | ORAL | Status: DC
  Administered 2023-11-11 – 2023-11-13 (×3): 50 mg via ORAL
  Filled 2023-11-11 (×3): qty 2

## 2023-11-11 NOTE — Progress Notes (Signed)
 Lawrence Bennett  ZOX:[id number] DOB: [birth year] DOA: 11/10/2023 PCP: Eliodoro Guerin, DO    Brief Narrative:  68 year old with a history of CVA on Plavix , HTN, HLD, AAA status post EVAR 2/23, bipolar disorder, depression/anxiety, BPH, and HOH who presented to the ED after experiencing multiple falls and generalized weakness at home.  The patient started Wegovy  1 week prior to this admission, and stated that since doing so he has had very poor appetite with very limited intake.  He reported multiple falls due to feeling that his "legs were giving out."  He denied ever losing consciousness or striking his head.  He denies lightheadedness dizziness or chest pain.  In the ER he was found to have a sodium of 119.  X-rays were without evidence of acute bony abnormality.  Goals of Care:   Code Status: Limited: Do not attempt resuscitation (DNR) -DNR-LIMITED -Do Not Intubate/DNI    DVT prophylaxis: lovenox  Interim Hx: Afebrile since admission.  Vital signs stable.  Sodium slowly improving.  Sitting up at bedside.  States he feels somewhat better overall.  No new complaints.  Denies chest pain or shortness of breath.  Assessment & Plan:  Acute hyponatremia Likely multifactorial to include primarily volume depletion/very poor intake compounded by SSRIs as well as diuretic -urine sodium is 16 which is not consistent with SIADH -follow with gentle hydration  Acute kidney injury Creatinine 1.33 at presentation with baseline creatinine 1.0 -likely simply prerenal azotemia -trend with volume expansion  Bipolar disorder - depression/anxiety Resume usual home medications and monitor closely  Multiple falls at home Likely due to baseline deconditioning and exacerbated by volume depletion and hyponatremia - PT/OT evaluations pending  HTN Discontinue hydrochlorothiazide  - continue olmesartan  and amlodipine   History of CVA Continue Plavix  and atorvastatin  - check CT head to rule out subacute  CVA  HLD Continue atorvastatin   BPH Continue Flomax   Obesity - Body mass index is 38.31 kg/m.   Family Communication: Spoke with wife and sister at bedside at length Disposition: Anticipate discharge home, likely in 1-2 days   Objective: Blood pressure 134/75, pulse 66, temperature 97.8 F (36.6 C), resp. rate 17, height 5\' 10"  (1.778 m), weight 121.1 kg, SpO2 93%.  Intake/Output Summary (Last 24 hours) at 11/11/2023 0745 Last data filed at 11/11/2023 0550 Gross per 24 hour  Intake --  Output 220 ml  Net -220 ml   Filed Weights   11/10/23 1520  Weight: 121.1 kg    Examination: General: No acute respiratory distress Lungs: Clear to auscultation bilaterally without wheezes or crackles Cardiovascular: Regular rate and rhythm without murmur gallop or rub normal S1 and S2 Abdomen: Nontender, nondistended, soft, bowel sounds positive, no rebound, no ascites, no appreciable mass Extremities: No significant cyanosis, clubbing, or edema bilateral lower extremities  CBC: Recent Labs  Lab 11/10/23 1705 11/11/23 0202  WBC 8.9 7.9  NEUTROABS 6.1  --   HGB 13.2 13.5  HCT 37.9* 38.4*  MCV 85.7 85.7  PLT 272 265   Basic Metabolic Panel: Recent Labs  Lab 11/10/23 1705 11/10/23 2230 11/11/23 0202  NA 119* 119* 120*  K 4.7  --  4.0  CL 84*  --  85*  CO2 21*  --  24  GLUCOSE 109*  --  93  BUN 14  --  13  CREATININE 1.33*  --  1.25*  CALCIUM  9.0  --  9.3   GFR: Estimated Creatinine Clearance: 72.7 mL/min (A) (by C-G formula based on SCr of 1.25  mg/dL (H)).   Scheduled Meds:  amLODipine   10 mg Oral Daily   atorvastatin   80 mg Oral Daily   clopidogrel   75 mg Oral Daily   enoxaparin (LOVENOX) injection  60 mg Subcutaneous Q24H   irbesartan  300 mg Oral Daily   LORazepam   0.5 mg Oral TID   senna  1 tablet Oral BID   sodium chloride  flush  3 mL Intravenous Q12H   tamsulosin   0.4 mg Oral QPC breakfast   traZODone   50 mg Oral QHS     LOS: 1 day   Abbe Abate, MD [facility] Office  [phone] Pager - Text Page per Tilford Foley  If 7PM-7AM, please contact night-coverage per Amion 11/11/2023, 7:45 AM

## 2023-11-11 NOTE — Progress Notes (Signed)
 11/11/23 1100  Spiritual Encounters  Type of Visit Initial  Care provided to: Pt and family  Reason for visit Advance directives  OnCall Visit No    Chaplain responded to a consult request for Advance Directive education. Chaplain provided the education to the patient. His wife and sister were at the bedside.   Chaplain provided the Advance Directive packet as well as education on Advance Directives-documents an individual completes to communicate their health care directions in advance of a time when they may need them. Chaplain informed pt the documents which may be completed here in the hospital are the Living Will and Health Care Power of [place].  Chaplain informed that the Health Care Power of [place] is a legal document in which an individual names another person, their Health Care Agent, to make health care decisions when the individual is not able to make them for themselves. The Health Care Agent's function can be temporary or permanent depending on the pt's ability to make and communicate those decisions independently. Chaplain informed pt in the absence of a Health Care Power of Attorney, the state of [place]  directs health care providers to look to the following individuals in the order listed: legal guardian; an attorney?in?fact under a general power of attorney (POA) if that POA includes the right to make health care decisions; a husband or wife; a majority of parents and adult children; a majority of adult brothers and sisters; or an individual who has an established relationship with you, who is acting in good faith and who can convey your wishes.  If none of these person are available or willing to make medical decisions on a patient's behalf, the law allows the patient's doctor to make decisions for them as long as another doctor agrees with those decisions.  Chaplain also informed the patient that the Health Care agent has no decision-making authority over any affairs  other than those related to his or her medical care.  The chaplain further educated the pt that a Living Will is a legal document that allows an individual to state his or her desire not to receive life-prolonging measures in the event that they have a condition that is incurable and will result in their death in a short period of time; they are unconscious, and doctors are confident that they will not regain consciousness; and/or they have advanced dementia or other substantial and irreversible loss of mental function. The chaplain informed pt that life-prolonging measures are medical treatments that would only serve to postpone death, including breathing machines, kidney dialysis, antibiotics, artificial nutrition and hydration (tube feeding), and similar forms of treatment and that if an individual is able to express their wishes, they may also make them known without the use of a Living Will, but in the event that an individual is not able to express their wishes themselves, a Living Will allows medical providers and the pt's family and friends ensure that they are not making decisions on the pt's behalf, but rather serving as the pt's voice to convey decisions the pt has already made.  The patient is aware that the decision to create an advance directive is theirs alone and they may chose not to complete the documents or may chose to complete one portion or both.  The patient was informed that they can revoke the documents at any time by striking through them and writing void or by completing new documents, but that it is also advisable that the individual verbally notify interested  parties that their wishes have changed.  They are also aware that the document must be signed in the presence of a notary public and two witnesses and that this can be done while the patient is still admitted to the hospital or after discharge in the community. If they decide to complete Advance Directives after being  discharged from the hospital, they have been advised to notify all interested parties and to provide those documents to their physicians and loved ones in addition to bringing them to the hospital in the event of another hospitalization.  The chaplain informed the pt that if they desire to proceed with completing Advance Directive Documentation while they are still admitted, notary services are typically available at [facility] between the hours of 1:00 and 3:30 Monday-Thursday.    When the patient is ready to have these documents completed, the patient should request that their nurse place a spiritual care consult and indicate that the patient is ready to have their advance directives notarized so that arrangements for witnesses and notary public can be made.  Please page spiritual care if the patient desires further education or has questions.      M.Kubra Welby Hale Resident [phone]

## 2023-11-11 NOTE — Evaluation (Signed)
 Occupational Therapy Evaluation Patient Details Name: Lawrence Bennett MRN: [id number] DOB: [birth year] Today's Date: 11/11/2023   History of Present Illness   Lawrence Bennett is a 68 y.o. male addmitted 5/11 after fall at home.  REcently began taking Wegovy .  Admit for hyponatremia.  PMH: CVA on Plavix , HTN, HLD, AAA s/p EVAR 08/2021, BPD, depression/anxiety, hard of hearing     Clinical Impressions Pt reports ind at baseline with ADLs and functional mobility, though was using a standard walker a few days PTA since fall. Pt currently needs up to mod A for ADLs, CGA for bed mobility and CGA for transfers with RW. Pt with 1/4 DOE after ambulating short distance, SpO2 98% on RA. Pt HOH, needs incr time for processing/command follow throughout session. Pt presenting with impairments listed below, will follow acutely. Recommend HHOT at d/c.      If plan is discharge home, recommend the following:   A little help with walking and/or transfers;A little help with bathing/dressing/bathroom;Assistance with cooking/housework;Direct supervision/assist for medications management;Direct supervision/assist for financial management;Assist for transportation;Help with stairs or ramp for entrance     Functional Status Assessment   Patient has had a recent decline in their functional status and demonstrates the ability to make significant improvements in function in a reasonable and predictable amount of time.     Equipment Recommendations   BSC/3in1     Recommendations for Other Services   PT consult     Precautions/Restrictions   Precautions Precautions: Fall Restrictions Weight Bearing Restrictions Per Provider Order: No     Mobility Bed Mobility Overal bed mobility: Needs Assistance Bed Mobility: Supine to Sit     Supine to sit: Contact guard     General bed mobility comments: incr time due to hip pain    Transfers Overall transfer level: Needs assistance Equipment used:  Rolling walker (2 wheels) Transfers: Sit to/from Stand Sit to Stand: Contact guard assist                  Balance Overall balance assessment: Needs assistance Sitting-balance support: No upper extremity supported, Feet supported Sitting balance-Leahy Scale: Fair     Standing balance support: Bilateral upper extremity supported, During functional activity Standing balance-Leahy Scale: Poor Standing balance comment: relies on UE support on RW for baqlance and support                           ADL either performed or assessed with clinical judgement   ADL Overall ADL's : Needs assistance/impaired Eating/Feeding: Set up   Grooming: Set up   Upper Body Bathing: Sitting;Minimal assistance   Lower Body Bathing: Moderate assistance;Sitting/lateral leans   Upper Body Dressing : Minimal assistance;Sitting   Lower Body Dressing: Moderate assistance;Sitting/lateral leans   Toilet Transfer: Contact guard assist;Ambulation;Rolling walker (2 wheels);Regular Toilet   Toileting- Clothing Manipulation and Hygiene: Contact guard assist       Functional mobility during ADLs: Contact guard assist;Rolling walker (2 wheels)       Vision Baseline Vision/History: 1 Wears glasses Vision Assessment?: No apparent visual deficits     Perception Perception: Not tested       Praxis Praxis: Not tested       Pertinent Vitals/Pain Pain Assessment Pain Assessment: Faces Pain Score: 6  Faces Pain Scale: Hurts even more Pain Location: R hip Pain Descriptors / Indicators: Aching, Discomfort, Grimacing, Guarding Pain Intervention(s): Limited activity within patient's tolerance, Monitored during session, Repositioned  Extremity/Trunk Assessment Upper Extremity Assessment Upper Extremity Assessment: Overall WFL for tasks assessed   Lower Extremity Assessment Lower Extremity Assessment: Defer to PT evaluation   Cervical / Trunk Assessment Cervical / Trunk Assessment:  Normal   Communication Communication Communication: Impaired Factors Affecting Communication: Hearing impaired (has pocket talker)   Cognition Arousal: Alert Behavior During Therapy: WFL for tasks assessed/performed Cognition: No apparent impairments                               Following commands: Intact       Cueing  General Comments   Cueing Techniques: Verbal cues;Gestural cues  VSS,1/4 DOE, SpO2 98% on RA   Exercises     Shoulder Instructions      Home Living Family/patient expects to be discharged to:: Private residence Living Arrangements: Spouse/significant other;Other relatives (2 children in home that work days ;wife works) Available Help at Discharge: Family;Available PRN/intermittently Type of Home: Mobile home Home Access: Ramped entrance     Home Layout: One level     Bathroom Shower/Tub: Producer, television/film/video: Standard     Home Equipment: Cane - single point;Grab bars - tub/shower;Standard Warden/ranger Comments: has difficulty on steps when goes out      Prior Functioning/Environment Prior Level of Function : Independent/Modified Independent;Driving;History of Falls (last six months)             Mobility Comments: Wife just bought standard walker ADLs Comments: Does yardwork and was I ADLs    OT Problem List: Decreased strength;Decreased range of motion;Decreased activity tolerance;Impaired balance (sitting and/or standing);Decreased coordination;Decreased cognition   OT Treatment/Interventions: Self-care/ADL training;Therapeutic exercise;Energy conservation;DME and/or AE instruction;Therapeutic activities;Cognitive remediation/compensation;Visual/perceptual remediation/compensation;Patient/family education      OT Goals(Current goals can be found in the care plan section)   Acute Rehab OT Goals Patient Stated Goal: none stated OT Goal Formulation: With patient Time For Goal Achievement:  11/25/23 Potential to Achieve Goals: Good ADL Goals Pt Will Perform Upper Body Dressing: with modified independence;sitting Pt Will Perform Lower Body Dressing: with modified independence;sitting/lateral leans;sit to/from stand Pt Will Transfer to Toilet: with modified independence;ambulating;regular height toilet Pt Will Perform Tub/Shower Transfer: Shower transfer;with supervision;ambulating   OT Frequency:  Min 2X/week    Co-evaluation              AM-PAC OT "6 Clicks" Daily Activity     Outcome Measure Help from another person eating meals?: None Help from another person taking care of personal grooming?: A Little Help from another person toileting, which includes using toliet, bedpan, or urinal?: A Little Help from another person bathing (including washing, rinsing, drying)?: A Lot Help from another person to put on and taking off regular upper body clothing?: A Little Help from another person to put on and taking off regular lower body clothing?: A Lot 6 Click Score: 17   End of Session Equipment Utilized During Treatment: Gait belt;Rolling walker (2 wheels) Nurse Communication: Mobility status  Activity Tolerance: Patient tolerated treatment well Patient left: in chair;with call bell/phone within reach;with family/visitor present  OT Visit Diagnosis: Unsteadiness on feet (R26.81);Other abnormalities of gait and mobility (R26.89);Muscle weakness (generalized) (M62.81)                Time: 6962-9528 OT Time Calculation (min): 22 min Charges:  OT General Charges $OT Visit: 1 Visit OT Evaluation $OT Eval Moderate Complexity: 1 Mod  Gildo Crisco K, OTD, OTR/L SecureChat  Preferred Acute Rehab [phone]  Antionette Kirks 11/11/2023, 4:37 PM

## 2023-11-11 NOTE — Progress Notes (Signed)
 Transition of Care [facility]) - Inpatient Brief Assessment   Patient Details  Name: GIOVONI HEFFERON MRN: [id number] Date of Birth: 1955-12-21  [facility] CM/SW Contact:    Dane Dung, RN Phone Number: 11/11/2023, 3:38 PM   Clinical Narrative: CM met with the patient at the bedside to discuss TOC needs for home health services.  Wife was present at the bedside since patient was [facility].  Medicare choice regarding home health and DME and patient/wife did not have a preference.  I called Randel Buss, RNCM with [facility] and he accepted for services.  HH orders placed to be co-signed by the MD.  [employer] called to deliver a 3:1 to the bedside.  DME order and DME note placed to be co-signed by the MD.   Transition of Care Asessment: Insurance and Status: (P) Insurance coverage has been reviewed Patient has primary care physician: (P) Yes Home environment has been reviewed: (P) from home with spouse Prior level of function:: (P) RW Prior/Current Home Services: (P) No current home services Social Drivers of Health Review: (P) SDOH reviewed interventions complete Readmission risk has been reviewed: (P) Yes Transition of care needs: (P) transition of care needs identified, TOC will continue to follow

## 2023-11-11 NOTE — Evaluation (Addendum)
 Physical Therapy Evaluation Patient Details Name: Lawrence Bennett MRN: [id number] DOB: September 06, 1955 Today's Date: 11/11/2023  History of Present Illness  Lawrence Bennett is a 68 y.o. male addmitted 5/11 after fall at home.  REcently began taking Wegovy .  Admit for hyponatremia.  PMH: CVA on Plavix , HTN, HLD, AAA s/p EVAR 08/2021, BPD, depression/anxiety, hard of hearing  Clinical Impression  Pt admitted with above diagnosis. Pt was able to ambulate a short distance to hallway with RW and chair follow with min assist of 2 persons.  Pt fatigues quickly and c/o right hip pain and back pain with movement. Notified MD. No LOB today with gait but reports weakness in bil LEs as he walks limiting distance. Will follow acutely and progress pt as able.  Pt currently with functional limitations due to the deficits listed below (see PT Problem List). Pt will benefit from acute skilled PT to increase their independence and safety with mobility to allow discharge.           If plan is discharge home, recommend the following: A little help with walking and/or transfers;A little help with bathing/dressing/bathroom;Assistance with cooking/housework;Assist for transportation;Help with stairs or ramp for entrance   Can travel by private vehicle        Equipment Recommendations BSC/3in1 (shower chair)  Recommendations for Other Services       Functional Status Assessment Patient has had a recent decline in their functional status and demonstrates the ability to make significant improvements in function in a reasonable and predictable amount of time.     Precautions / Restrictions Precautions Precautions: Fall Restrictions Weight Bearing Restrictions Per Provider Order: No      Mobility  Bed Mobility Overal bed mobility: Independent                  Transfers Overall transfer level: Needs assistance Equipment used: Rolling walker (2 wheels) Transfers: Sit to/from Stand Sit to Stand: Contact  guard assist           General transfer comment: Didnt need assist to stand but did give verbal cues for hand placement.    Ambulation/Gait Ambulation/Gait assistance: Min assist, +2 safety/equipment Gait Distance (Feet): 45 Feet Assistive device: Rolling walker (2 wheels) Gait Pattern/deviations: Step-to pattern, Decreased step length - right, Decreased step length - left, Decreased stride length, Decreased weight shift to right, Decreased stance time - right, Shuffle, Antalgic   Gait velocity interpretation: <1.31 ft/sec, indicative of household ambulator   General Gait Details: Pt was only able to ambulate just out of the door to room as he fatigues quickly.  No LOB but did follow with chair. Pt states legs feel like they weaken quickly.  Stairs            Wheelchair Mobility     Tilt Bed    Modified Rankin (Stroke Patients Only)       Balance Overall balance assessment: Needs assistance Sitting-balance support: No upper extremity supported, Feet supported Sitting balance-Leahy Scale: Fair     Standing balance support: Bilateral upper extremity supported, During functional activity Standing balance-Leahy Scale: Poor Standing balance comment: relies on UE support on RW for baqlance and support                             Pertinent Vitals/Pain Pain Assessment Pain Assessment: Faces Faces Pain Scale: Hurts whole lot Pain Location: right hip and back Pain Descriptors / Indicators: Aching, Discomfort, Grimacing, Guarding  Pain Intervention(s): Limited activity within patient's tolerance, Monitored during session, Repositioned    Home Living Family/patient expects to be discharged to:: Private residence Living Arrangements: Spouse/significant other;Other relatives (2 grandchildren in home that work days ;wife works) Available Help at Discharge: Family;Available PRN/intermittently Type of Home: Mobile home Home Access: Ramped entrance       Home  Layout: One level Home Equipment: Cane - single Librarian, academic (2 wheels);Grab bars - tub/shower Additional Comments: has difficulty on steps when goes out    Prior Function Prior Level of Function : Independent/Modified Independent;Driving;History of Falls (last six months)             Mobility Comments: Wife just bought RW for pt to use and he used it one day PTA ADLs Comments: Does yardwork and was I ADLs     Extremity/Trunk Assessment   Upper Extremity Assessment Upper Extremity Assessment: Defer to OT evaluation    Lower Extremity Assessment Lower Extremity Assessment: Generalized weakness    Cervical / Trunk Assessment Cervical / Trunk Assessment: Normal  Communication   Communication Communication: Impaired Factors Affecting Communication: Hearing impaired    Cognition Arousal: Alert Behavior During Therapy: WFL for tasks assessed/performed   PT - Cognitive impairments: No apparent impairments                         Following commands: Intact       Cueing       General Comments General comments (skin integrity, edema, etc.): VSS    Exercises General Exercises - Lower Extremity Ankle Circles/Pumps: AROM, Both, 5 reps, Seated Long Arc Quad: AROM, Both, 5 reps, Seated   Assessment/Plan    PT Assessment Patient needs continued PT services  PT Problem List Decreased activity tolerance;Decreased balance;Decreased mobility;Decreased knowledge of use of DME;Decreased knowledge of precautions;Decreased safety awareness;Pain       PT Treatment Interventions DME instruction;Gait training;Functional mobility training;Therapeutic activities;Therapeutic exercise;Balance training;Patient/family education    PT Goals (Current goals can be found in the Care Plan section)  Acute Rehab PT Goals Patient Stated Goal: to go home PT Goal Formulation: With patient Time For Goal Achievement: 11/25/23 Potential to Achieve Goals: Good    Frequency  Min 2X/week     Co-evaluation               AM-PAC PT "6 Clicks" Mobility  Outcome Measure Help needed turning from your back to your side while in a flat bed without using bedrails?: None Help needed moving from lying on your back to sitting on the side of a flat bed without using bedrails?: None Help needed moving to and from a bed to a chair (including a wheelchair)?: A Little Help needed standing up from a chair using your arms (e.g., wheelchair or bedside chair)?: A Little Help needed to walk in hospital room?: Total Help needed climbing 3-5 steps with a railing? : Total 6 Click Score: 16    End of Session Equipment Utilized During Treatment: Gait belt Activity Tolerance: Patient limited by fatigue Patient left: in chair;with call bell/phone within reach;with chair alarm set;with family/visitor present Nurse Communication: Mobility status PT Visit Diagnosis: Muscle weakness (generalized) (M62.81);Pain Pain - Right/Left: Right Pain - part of body: Hip    Time: 0922-1003 PT Time Calculation (min) (ACUTE ONLY): 41 min   Charges:   PT Evaluation $PT Eval Moderate Complexity: 1 Mod PT Treatments $Gait Training: 8-22 mins $Therapeutic Exercise: 8-22 mins PT General Charges $$ ACUTE PT  VISIT: 1 Visit         Cire Clute M,PT Acute Rehab Services [phone]   Florencia Hunter 11/11/2023, 10:19 AM

## 2023-11-11 NOTE — Progress Notes (Signed)
    Durable Medical Equipment  (From admission, onward)           Start     Ordered   11/11/23 1528  For home use only DME Bedside commode  Once       Comments: Needs 3:1 at home next to bed since patient is unable to mobilize to bathroom  Question Answer Comment  Patient needs a bedside commode to treat with the following condition Hyponatremia   Patient needs a bedside commode to treat with the following condition Generalized weakness      11/11/23 1527

## 2023-11-12 DIAGNOSIS — E871 Hypo-osmolality and hyponatremia: Secondary | ICD-10-CM | POA: Diagnosis not present

## 2023-11-12 LAB — BASIC METABOLIC PANEL WITH GFR
Anion gap: 6 (ref 5–15)
BUN: 11 mg/dL (ref 8–23)
CO2: 23 mmol/L (ref 22–32)
Calcium: 8.7 mg/dL — ABNORMAL LOW (ref 8.9–10.3)
Chloride: 97 mmol/L — ABNORMAL LOW (ref 98–111)
Creatinine, Ser: 1.1 mg/dL (ref 0.61–1.24)
GFR, Estimated: >60 mL/min (ref >60)
Glucose, Bld: 94 mg/dL (ref 70–99)
Potassium: 4.6 mmol/L (ref 3.5–5.1)
Sodium: 126 mmol/L — ABNORMAL LOW (ref 135–145)

## 2023-11-12 LAB — TSH: TSH: 1.396 u[IU]/mL (ref 0.350–4.500)

## 2023-11-12 MED ORDER — IRBESARTAN 150 MG PO TABS
150.0000 mg | ORAL_TABLET | Freq: Every day | ORAL | Status: DC
  Administered 2023-11-13: 150 mg via ORAL
  Filled 2023-11-12: qty 1

## 2023-11-12 NOTE — Progress Notes (Signed)
 Mobility Specialist: Progress Note   11/12/23 1218  Mobility  Activity Ambulated with assistance in hallway  Level of Assistance Contact guard assist, steadying assist  Assistive Device Front wheel walker  Distance Ambulated (ft) 200 ft  Activity Response Tolerated well  Mobility Referral Yes  Mobility visit 1 Mobility  Mobility Specialist Start Time (ACUTE ONLY) 0854  Mobility Specialist Stop Time (ACUTE ONLY) 0905  Mobility Specialist Time Calculation (min) (ACUTE ONLY) 11 min    Pt was very pleasant and agreeable to mobility session - received in chair. CG throughout w/ chair follow. No complaints. Returned to room without fault. Left in chair with all needs met, call bell in reach.   Deloria Fetch Mobility Specialist Please contact via SecureChat or Rehab office at [phone]

## 2023-11-12 NOTE — Plan of Care (Signed)

## 2023-11-12 NOTE — Progress Notes (Addendum)
 Lawrence Bennett  ZOX:[id number] DOB: [birth year] DOA: 11/10/2023 PCP: Eliodoro Guerin, DO    Brief Narrative:  68 year old with a history of CVA on Plavix , HTN, HLD, AAA status post EVAR 2/23, bipolar disorder, depression/anxiety, BPH, and HOH who presented to the ED after experiencing multiple falls and generalized weakness at home.  He reported very poor appetite with very limited intake.  He reported multiple falls due to feeling that his "legs were giving out."  He denied ever losing consciousness or striking his head.  He denies lightheadedness dizziness or chest pain.  In the ER he was found to have a sodium of 119.  X-rays were without evidence of acute bony abnormality. He was recently started on Ozempic , but clarifies that he has only had 1 dose thus far. He has also recently been placed on a combination BP med including hydrochlorothiazide .   Goals of Care:   Code Status: Limited: Do not attempt resuscitation (DNR) -DNR-LIMITED -Do Not Intubate/DNI    DVT prophylaxis: lovenox  Interim Hx: No acute events recorded overnight.  Afebrile.  Vital signs stable.  Sodium slowly but steadily improving as desired.  The patient feels significantly better today.  He has no new complaints.  He denies chest pain or shortness of breath.  Assessment & Plan:  Acute hyponatremia Likely multifactorial to include primarily volume depletion/very poor intake compounded by SSRIs as well as diuretic - urine sodium is 16 which is not consistent with SIADH -steadily improving with gentle hydration and avoidance of hydrochlorothiazide  -stop IV fluid this evening and recheck BMET in a.m.  Acute kidney injury Creatinine 1.33 at presentation with baseline creatinine 1.0 -likely simply prerenal azotemia -renal function has normalized with simple volume expansion  Bipolar disorder - depression/anxiety continue usual home medications   Multiple falls at home Likely due to baseline deconditioning and  exacerbated by volume depletion and hyponatremia - PT/OT suggest home health assistance   HTN Discontinue hydrochlorothiazide  - continue olmesartan  and amlodipine  -blood pressure well-controlled  History of CVA Continue Plavix  and atorvastatin  -CT head with no acute intracranial abnormality appreciated noting chronic small vessel disease and old left basal ganglia small vessel infarct  HLD Continue atorvastatin   BPH Continue Flomax   Obesity Class 2 - Body mass index is 38.31 kg/m.   Family Communication: Spoke with wife at bedside Disposition: Anticipate discharge home 5/14 if electrolytes have stabilized   Objective: Blood pressure (!) 126/59, pulse 71, temperature (!) 97.4 F (36.3 C), temperature source Oral, resp. rate 17, height 5\' 10"  (1.778 m), weight 121.1 kg, SpO2 95%.  Intake/Output Summary (Last 24 hours) at 11/12/2023 0827 Last data filed at 11/11/2023 2231 Gross per 24 hour  Intake 490 ml  Output 1200 ml  Net -710 ml   Filed Weights   11/10/23 1520  Weight: 121.1 kg    Examination: General: No acute respiratory distress Lungs: Clear to auscultation bilaterally without wheezes or crackles Cardiovascular: Regular rate and rhythm without murmur  Abdomen: Nontender, nondistended, soft, bowel sounds positive, no rebound, no ascites, no appreciable mass Extremities: No significant cyanosis, clubbing, or edema bilateral lower extremities  CBC: Recent Labs  Lab 11/10/23 1705 11/11/23 0202  WBC 8.9 7.9  NEUTROABS 6.1  --   HGB 13.2 13.5  HCT 37.9* 38.4*  MCV 85.7 85.7  PLT 272 265   Basic Metabolic Panel: Recent Labs  Lab 11/10/23 1705 11/10/23 2230 11/11/23 0202 11/12/23 0634  NA 119* 119* 120* 126*  K 4.7  --  4.0 4.6  CL 84*  --  85* 97*  CO2 21*  --  24 23  GLUCOSE 109*  --  93 94  BUN 14  --  13 11  CREATININE 1.33*  --  1.25* 1.10  CALCIUM  9.0  --  9.3 8.7*   GFR: Estimated Creatinine Clearance: 82.7 mL/min (by C-G formula based on SCr  of 1.1 mg/dL).   Scheduled Meds:  amLODipine   10 mg Oral Daily   atorvastatin   80 mg Oral Daily   clopidogrel   75 mg Oral Daily   divalproex   500 mg Oral QHS   enoxaparin (LOVENOX) injection  60 mg Subcutaneous Q24H   irbesartan  300 mg Oral Daily   LORazepam   0.5 mg Oral TID   PARoxetine   20 mg Oral QHS   QUEtiapine   50 mg Oral QHS   senna  1 tablet Oral BID   sodium chloride  flush  3 mL Intravenous Q12H   tamsulosin   0.4 mg Oral QPC breakfast   traZODone   50 mg Oral QHS   venlafaxine  XR  75 mg Oral Q breakfast     LOS: 2 days   Abbe Abate, MD [facility] Office  [phone] Pager - Text Page per Tilford Foley  If 7PM-7AM, please contact night-coverage per Amion 11/12/2023, 8:27 AM

## 2023-11-13 DIAGNOSIS — E871 Hypo-osmolality and hyponatremia: Secondary | ICD-10-CM | POA: Diagnosis not present

## 2023-11-13 LAB — BASIC METABOLIC PANEL WITH GFR
Anion gap: 12 (ref 5–15)
BUN: 10 mg/dL (ref 8–23)
CO2: 17 mmol/L — ABNORMAL LOW (ref 22–32)
Calcium: 8.8 mg/dL — ABNORMAL LOW (ref 8.9–10.3)
Chloride: 103 mmol/L (ref 98–111)
Creatinine, Ser: 1.2 mg/dL (ref 0.61–1.24)
GFR, Estimated: >60 mL/min (ref >60)
Glucose, Bld: 85 mg/dL (ref 70–99)
Potassium: 4.7 mmol/L (ref 3.5–5.1)
Sodium: 132 mmol/L — ABNORMAL LOW (ref 135–145)

## 2023-11-13 MED ORDER — LACTULOSE 10 GM/15ML PO SOLN
20.0000 g | Freq: Two times a day (BID) | ORAL | Status: DC
  Administered 2023-11-13: 20 g via ORAL
  Filled 2023-11-13 (×3): qty 30

## 2023-11-13 MED ORDER — LOSARTAN POTASSIUM 50 MG PO TABS
50.0000 mg | ORAL_TABLET | Freq: Every day | ORAL | Status: DC
  Administered 2023-11-14: 50 mg via ORAL
  Filled 2023-11-13: qty 1

## 2023-11-13 NOTE — Progress Notes (Signed)
 Physical Therapy Treatment Patient Details Name: Lawrence Bennett MRN: [id number] DOB: 08-23-1955 Today's Date: 11/13/2023   History of Present Illness [name] is a 68 y.o. male addmitted 5/11 after fall at home.  REcently began taking Wegovy .  Admit for hyponatremia.  PMH: CVA on Plavix , HTN, HLD, AAA s/p EVAR 08/2021, BPD, depression/anxiety, hard of hearing    PT Comments  Pt received in chair after recently returning from bathroom with assist from spouse, pt agreeable to therapy session and with good participation and improved tolerance for gait training this date compared with initial eval. Pt and spouse notified of s/sx to monitor for with his recent history of hyponatremia and orthostatic BP checked and BP stable with positional changes today. Pt benefits from chair follow for gait progression, needing x1 seated recovery break to perform longer household distance gait trial. Pt with decreased safety awareness at times and difficulty following cues for step-to pattern to reduce R hip pain, possibly due to prior CVA vs HoH status. Pt continues to benefit from PT services to progress toward functional mobility goals.   Vital Signs  Patient Position (if appropriate) Orthostatic Vitals  Orthostatic Lying   BP- Lying 129/78  Pulse- Lying 86  Orthostatic Sitting  BP- Sitting 128/80  Pulse- Sitting 75  Orthostatic Standing at 0 minutes  BP- Standing at 0 minutes 127/77  Pulse- Standing at 0 minutes  (did not record, was [facility])     If plan is discharge home, recommend the following: A little help with walking and/or transfers;A little help with bathing/dressing/bathroom;Assistance with cooking/housework;Assist for transportation;Help with stairs or ramp for entrance   Can travel by private vehicle        Equipment Recommendations  BSC/3in1 (shower chair)    Recommendations for Other Services       Precautions / Restrictions Precautions Precautions: Fall Recall of  Precautions/Restrictions: Impaired Restrictions Weight Bearing Restrictions Per Provider Order: No     Mobility  Bed Mobility Overal bed mobility: Needs Assistance Bed Mobility: Supine to Sit     Supine to sit: Min assist     General bed mobility comments: pt received up in chair    Transfers Overall transfer level: Needs assistance Equipment used: Rolling walker (2 wheels) Transfers: Sit to/from Stand Sit to Stand: Contact guard assist           General transfer comment: cues for UE placement; to/from wheelchair when resting in hallway, then to recliner    Ambulation/Gait Ambulation/Gait assistance: Contact guard assist, Min assist, +2 safety/equipment Gait Distance (Feet): 120 Feet (x2 with seated break) Assistive device: Rolling walker (2 wheels) Gait Pattern/deviations: Step-to pattern, Decreased step length - right, Decreased step length - left, Decreased stride length, Decreased weight shift to right, Decreased stance time - right, Shuffle, Antalgic, Step-through pattern Gait velocity: variable, grossly <0.4 m/s     General Gait Details: Cues for step-to pattern with closer proximity within RW to offload R hip pain but decreased carryover of instruction; PTA also demo for his spouse so she can reinforce this with him; RW adjusted one click lower to aid him in pushing more down through RW with his arms. Chair follow for safety with pt needing ~3 min seated break to recover between trials.   Stairs Stairs:  (pt has a ramp)           Wheelchair Mobility     Tilt Bed    Modified Rankin (Stroke Patients Only)       Balance  Overall balance assessment: Needs assistance Sitting-balance support: No upper extremity supported, Feet supported Sitting balance-Leahy Scale: Fair     Standing balance support: Bilateral upper extremity supported, During functional activity Standing balance-Leahy Scale: Poor Standing balance comment: relies on UE support for  balance, +1 static standing and +2 UE support dynamic standing                            Communication Communication Communication: Impaired Factors Affecting Communication: Hearing impaired (has pocket talker but still has issues hearing instructions)  Cognition Arousal: Alert Behavior During Therapy: WFL for tasks assessed/performed   PT - Cognitive impairments: Safety/Judgement                       PT - Cognition Comments: pt at times ignores cues for safe UE placement vs HoH. Decreased carryover of cues for step-to pattern to reduce his R hip pain. PTA reinforced safety with spouse. Following commands: Intact      Cueing Cueing Techniques: Verbal cues, Gestural cues, Visual cues  Exercises General Exercises - Lower Extremity Long Arc Quad: AROM, Both, 5 reps, Seated    General Comments General comments (skin integrity, edema, etc.): see orthostatic readings above; no acute s/sx distress, HR/SpO2 WFL on RA with gait trials      Pertinent Vitals/Pain Pain Assessment Pain Assessment: Faces Faces Pain Scale: Hurts even more Pain Location: R hip Pain Descriptors / Indicators: Aching, Discomfort, Grimacing, Guarding Pain Intervention(s): Limited activity within patient's tolerance, Monitored during session, Repositioned    Home Living                          Prior Function            PT Goals (current goals can now be found in the care plan section) Acute Rehab PT Goals Patient Stated Goal: to go home PT Goal Formulation: With patient Time For Goal Achievement: 11/25/23 Progress towards PT goals: Progressing toward goals    Frequency    Min 2X/week      PT Plan      Co-evaluation              AM-PAC PT "6 Clicks" Mobility   Outcome Measure  Help needed turning from your back to your side while in a flat bed without using bedrails?: None Help needed moving from lying on your back to sitting on the side of a flat bed  without using bedrails?: A Little Help needed moving to and from a bed to a chair (including a wheelchair)?: A Little Help needed standing up from a chair using your arms (e.g., wheelchair or bedside chair)?: A Little Help needed to walk in hospital room?: A Little Help needed climbing 3-5 steps with a railing? : Total (pt has a ramp) 6 Click Score: 17    End of Session Equipment Utilized During Treatment: Gait belt Activity Tolerance: Patient tolerated treatment well;Patient limited by pain Patient left: in chair;with call bell/phone within reach;with family/visitor present;Other (comment) (no chair alarm in room, spouse agreeable to remain while he is up in chair, RN aware also) Nurse Communication: Mobility status PT Visit Diagnosis: Muscle weakness (generalized) (M62.81);Pain Pain - Right/Left: Right Pain - part of body: Hip     Time: 0981-1914 PT Time Calculation (min) (ACUTE ONLY): 19 min  Charges:    $Gait Training: 8-22 mins $Therapeutic Activity: 8-22 mins PT General  Charges $$ ACUTE PT VISIT: 1 Visit                     Kemari Narez P., PTA Acute Rehabilitation Services Secure Chat Preferred 9a-5:30pm Office: [phone]    Mariel Shope [facility] 11/13/2023, 6:08 PM

## 2023-11-13 NOTE — Plan of Care (Signed)
   Problem: Activity: Goal: Risk for activity intolerance will decrease Outcome: Progressing   Problem: Nutrition: Goal: Adequate nutrition will be maintained Outcome: Progressing   Problem: Coping: Goal: Level of anxiety will decrease Outcome: Progressing   Problem: Elimination: Goal: Will not experience complications related to urinary retention Outcome: Progressing   Problem: Safety: Goal: Ability to remain free from injury will improve Outcome: Progressing   Problem: Skin Integrity: Goal: Risk for impaired skin integrity will decrease Outcome: Progressing

## 2023-11-13 NOTE — Progress Notes (Signed)
 Physical Therapy Treatment Patient Details Name: Lawrence Bennett MRN: [id number] DOB: 03-09-1956 Today's Date: 11/13/2023   History of Present Illness Lawrence Bennett is a 68 y.o. male addmitted 5/11 after fall at home.  REcently began taking Wegovy .  Admit for hyponatremia.  PMH: CVA on Plavix , HTN, HLD, AAA s/p EVAR 08/2021, BPD, depression/anxiety, hard of hearing    PT Comments  Pt received in supine, agreeable to therapy session with emphasis on transfer and gait training, however activity tolerance limited due to pt bowel urgency while standing. Pt needing up to minA for bed mobility and mostly CGA for transfers and short household distance gait trial with RW, pt with decreased safety awareness and often not following PTA commands but also HoH and distracted by need for toileting. Pt agreeable to work on full orthostatic vitals assessment and gait progression next session. Pt continues to benefit from PT services to progress toward functional mobility goals.     If plan is discharge home, recommend the following: A little help with walking and/or transfers;A little help with bathing/dressing/bathroom;Assistance with cooking/housework;Assist for transportation;Help with stairs or ramp for entrance   Can travel by private vehicle        Equipment Recommendations  BSC/3in1 (shower chair)    Recommendations for Other Services       Precautions / Restrictions Precautions Precautions: Fall Restrictions Weight Bearing Restrictions Per Provider Order: No     Mobility  Bed Mobility Overal bed mobility: Needs Assistance Bed Mobility: Supine to Sit     Supine to sit: Min assist     General bed mobility comments: pt pulling up on spouse's arm to raise trunk, pt HoH did not hear PTA instruction to try on his own first.    Transfers Overall transfer level: Needs assistance Equipment used: Rolling walker (2 wheels) Transfers: Sit to/from Stand Sit to Stand: Contact guard assist            General transfer comment: Didnt need assist to stand but pt using momentum strategy to achieve upright, pt did not appear to hear cues to push from bed surface when standing.    Ambulation/Gait Ambulation/Gait assistance: Contact guard assist Gait Distance (Feet): 15 Feet Assistive device: Rolling walker (2 wheels) Gait Pattern/deviations: Step-to pattern, Decreased step length - right, Decreased step length - left, Decreased stride length, Decreased weight shift to right, Decreased stance time - right, Shuffle, Antalgic Gait velocity: variable, grossly <0.4 m/s     General Gait Details: distance limited to bathroom due to pt c/o bowel urgency, pt abandons RW ~35ft prior to toilet and using counter and bathroom railings for support despite cues from PTA not to let go of RW; pt HoH.   Stairs Stairs:  (pt has a ramp)           Wheelchair Mobility     Tilt Bed    Modified Rankin (Stroke Patients Only)       Balance Overall balance assessment: Needs assistance Sitting-balance support: No upper extremity supported, Feet supported Sitting balance-Leahy Scale: Fair     Standing balance support: Bilateral upper extremity supported, During functional activity Standing balance-Leahy Scale: Poor Standing balance comment: relies on UE support for balance, +1 static standing and +2 UE support dynamic standing                            Communication Communication Communication: Impaired Factors Affecting Communication: Hearing impaired (has pocket talker)  Cognition Arousal:  Alert Behavior During Therapy: [facility] for tasks assessed/performed                             Following commands: Intact      Cueing Cueing Techniques: Verbal cues, Gestural cues  Exercises      General Comments General comments (skin integrity, edema, etc.): orthostatic readings WFL when taken, see next note for full list      Pertinent Vitals/Pain Pain  Assessment Faces Pain Scale: Hurts even more Pain Location: R hip Pain Descriptors / Indicators: Aching, Discomfort, Grimacing, Guarding    Home Living                          Prior Function            PT Goals (current goals can now be found in the care plan section) Acute Rehab PT Goals PT Goal Formulation: With patient Time For Goal Achievement: 11/25/23 Progress towards PT goals: Progressing toward goals    Frequency    Min 2X/week      PT Plan      Co-evaluation              AM-PAC PT "6 Clicks" Mobility   Outcome Measure  Help needed turning from your back to your side while in a flat bed without using bedrails?: None Help needed moving from lying on your back to sitting on the side of a flat bed without using bedrails?: A Little Help needed moving to and from a bed to a chair (including a wheelchair)?: A Little Help needed standing up from a chair using your arms (e.g., wheelchair or bedside chair)?: A Little Help needed to walk in hospital room?: A Little Help needed climbing 3-5 steps with a railing? : Total 6 Click Score: 17    End of Session Equipment Utilized During Treatment: Gait belt Activity Tolerance: Patient tolerated treatment well;Treatment limited secondary to medical complications (Comment);Other (comment) (pt with toileting urgency needing >5 mins in bathroom, pt agreeable to ambulate again later) Patient left: in chair;with call bell/phone within reach;with family/visitor present;Other (comment) (pt up on toilet, spouse in room, pt agreeable to pull wall bell when done with BM) Nurse Communication: Mobility status;Other (comment) (pt up in bathroom he is aware he is supposed to notify staff before getting up; BP stable supine to sitting) PT Visit Diagnosis: Muscle weakness (generalized) (M62.81);Pain Pain - Right/Left: Right Pain - part of body: Hip     Time: 1610-9604 PT Time Calculation (min) (ACUTE ONLY): 14  min  Charges:    $Therapeutic Activity: 8-22 mins PT General Charges $$ ACUTE PT VISIT: 1 Visit                     Shanicka Oldenkamp P., PTA Acute Rehabilitation Services Secure Chat Preferred 9a-5:30pm Office: [phone]    Mariel Shope [facility] 11/13/2023, 5:56 PM

## 2023-11-13 NOTE — Progress Notes (Signed)
 Lawrence Bennett  ZOX:[id number] DOB: Sep 16, 1955 DOA: 11/10/2023 PCP: Eliodoro Guerin, DO    Brief Narrative:  68 year old with a history of CVA on Plavix , HTN, HLD, AAA status post EVAR 2/23, bipolar disorder, depression/anxiety, BPH, and HOH who presented to the ED after experiencing multiple falls and generalized weakness at home.  He reported very poor appetite with very limited intake.  He reported multiple falls due to feeling that his "legs were giving out."  He denied ever losing consciousness or striking his head.  He denies lightheadedness dizziness or chest pain.  In the ER he was found to have a sodium of 119.  X-rays were without evidence of acute bony abnormality. He was recently started on Ozempic , but clarifies that he has only had 1 dose thus far. He has also recently been placed on a combination BP med including hydrochlorothiazide .   The patient states that he continues to feel very tired and sluggish. He is concerned that his diastolic blood pressure is too low at 69. His diastolic has been from 109 - 125. Sodium is improved at 132 this morning, but the patient has only had one small BM in the past week. He attributes this to ozempic . His last dose was 8 days ago.  Goals of Care:   Code Status: Limited: Do not attempt resuscitation (DNR) -DNR-LIMITED -Do Not Intubate/DNI    DVT prophylaxis: lovenox  Interim Hx: No acute events recorded overnight.  Afebrile.  Vital signs stable.  Sodium slowly but steadily improving as desired.  The patient feels significantly better today.  He has no new complaints.  He denies chest pain or shortness of breath.  Assessment & Plan:  Acute hyponatremia Likely multifactorial to include primarily volume depletion/very poor intake compounded by SSRIs as well as diuretic - urine sodium is 16 which is not consistent with SIADH -steadily improving with gentle hydration and avoidance of hydrochlorothiazide  -stop IV fluid this evening and recheck  BMET in a.m.  Acute kidney injury Creatinine 1.33 at presentation with baseline creatinine 1.0 -likely simply prerenal azotemia -renal function has normalized with simple volume expansion  Constipation Have increase Miralax to bid, senna to 2 capsules and added lactulose. Pt will be discharged after he has had 2 large BM's.  Bipolar disorder - depression/anxiety continue usual home medications   Multiple falls at home Likely due to baseline deconditioning and exacerbated by volume depletion and hyponatremia - PT/OT suggest home health assistance. Will check orthostatic vitals today.  HTN Discontinue hydrochlorothiazide  - continue olmesartan  and amlodipine  -blood pressure well-controlled. Patient is normotensive. Will check orthostatics. The patient is complaining of feeling sluggish and tired and is afraid that his BP is too low.   History of CVA Continue Plavix  and atorvastatin  -CT head with no acute intracranial abnormality appreciated noting chronic small vessel disease and old left basal ganglia small vessel infarct  HLD Continue atorvastatin   BPH Continue Flomax   Obesity Class 2 - Body mass index is 38.31 kg/m.   Family Communication: Spoke with wife at bedside Disposition: Anticipate discharge home 5/15, if he has a BM.    Objective: Blood pressure 109/70, pulse 80, temperature 98.2 F (36.8 C), temperature source Oral, resp. rate 18, height 5\' 10"  (1.778 m), weight 121.1 kg, SpO2 99%.  Intake/Output Summary (Last 24 hours) at 11/13/2023 1733 Last data filed at 11/13/2023 1132 Gross per 24 hour  Intake 3 ml  Output 600 ml  Net -597 ml   Filed Weights   11/10/23 1520  Weight: 121.1 kg    Examination: Exam:  Constitutional:  The patient is awake, alert, and oriented x 3. No acute distress. Eyes:  pupils and irises appear normal Conjunctiva are injected. The patient has had corneal transplants and has not taken his eye drops. Respiratory:  No increased work  of breathing. No wheezes, rales, or rhonchi No tactile fremitus Cardiovascular:  Regular rate and rhythm No murmurs, ectopy, or gallups. No lateral PMI. No thrills. Abdomen:  Abdomen is soft, non-tender, non-distended No hernias, masses, or organomegaly Normoactive bowel sounds.  Musculoskeletal:  No cyanosis or clubbing Positive for 2+ pitting edema Skin:  No rashes, lesions, ulcers palpation of skin: no induration or nodules Neurologic:  CN 2-12 intact Sensation all 4 extremities intact Psychiatric:  Mental status Mood, affect appropriate Orientation to person, place, time  judgment and insight appear intact   CBC: Recent Labs  Lab 11/10/23 1705 11/11/23 0202  WBC 8.9 7.9  NEUTROABS 6.1  --   HGB 13.2 13.5  HCT 37.9* 38.4*  MCV 85.7 85.7  PLT 272 265   Basic Metabolic Panel: Recent Labs  Lab 11/11/23 0202 11/12/23 0634 11/13/23 0631  NA 120* 126* 132*  K 4.0 4.6 4.7  CL 85* 97* 103  CO2 24 23 17*  GLUCOSE 93 94 85  BUN 13 11 10   CREATININE 1.25* 1.10 1.20  CALCIUM  9.3 8.7* 8.8*   GFR: Estimated Creatinine Clearance: 75.8 mL/min (by C-G formula based on SCr of 1.2 mg/dL).   Scheduled Meds:  atorvastatin   80 mg Oral Daily   clopidogrel   75 mg Oral Daily   divalproex   500 mg Oral QHS   enoxaparin (LOVENOX) injection  60 mg Subcutaneous Q24H   lactulose  20 g Oral BID   LORazepam   0.5 mg Oral TID   [START ON 11/14/2023] losartan   50 mg Oral Daily   PARoxetine   20 mg Oral QHS   QUEtiapine   50 mg Oral QHS   senna  1 tablet Oral BID   sodium chloride  flush  3 mL Intravenous Q12H   tamsulosin   0.4 mg Oral QPC breakfast   traZODone   50 mg Oral QHS   venlafaxine  XR  75 mg Oral Q breakfast     LOS: 3 days   Kila Godina, DO [facility] Office  [phone] Pager - Text Page per Tilford Foley  If 7PM-7AM, please contact night-coverage per Amion 11/13/2023, 5:33 PM

## 2023-11-14 ENCOUNTER — Other Ambulatory Visit (HOSPITAL_COMMUNITY): Payer: Self-pay

## 2023-11-14 DIAGNOSIS — E871 Hypo-osmolality and hyponatremia: Secondary | ICD-10-CM | POA: Diagnosis not present

## 2023-11-14 LAB — CBC WITH DIFFERENTIAL/PLATELET
Abs Immature Granulocytes: 0.04 10*3/uL (ref 0.00–0.07)
Basophils Absolute: 0.1 10*3/uL (ref 0.0–0.1)
Basophils Relative: 1 %
Eosinophils Absolute: 0.2 10*3/uL (ref 0.0–0.5)
Eosinophils Relative: 4 %
HCT: 32 % — ABNORMAL LOW (ref 39.0–52.0)
Hemoglobin: 10.9 g/dL — ABNORMAL LOW (ref 13.0–17.0)
Immature Granulocytes: 1 %
Lymphocytes Relative: 35 %
Lymphs Abs: 2.4 10*3/uL (ref 0.7–4.0)
MCH: 30.4 pg (ref 26.0–34.0)
MCHC: 34.1 g/dL (ref 30.0–36.0)
MCV: 89.1 fL (ref 80.0–100.0)
Monocytes Absolute: 0.7 10*3/uL (ref 0.1–1.0)
Monocytes Relative: 10 %
Neutro Abs: 3.4 10*3/uL (ref 1.7–7.7)
Neutrophils Relative %: 49 %
Platelets: 247 10*3/uL (ref 150–400)
RBC: 3.59 MIL/uL — ABNORMAL LOW (ref 4.22–5.81)
RDW: 14.9 % (ref 11.5–15.5)
WBC: 6.9 10*3/uL (ref 4.0–10.5)
nRBC: 0 % (ref 0.0–0.2)

## 2023-11-14 LAB — BASIC METABOLIC PANEL WITH GFR
Anion gap: 8 (ref 5–15)
BUN: 9 mg/dL (ref 8–23)
CO2: 23 mmol/L (ref 22–32)
Calcium: 8.8 mg/dL — ABNORMAL LOW (ref 8.9–10.3)
Chloride: 100 mmol/L (ref 98–111)
Creatinine, Ser: 1.15 mg/dL (ref 0.61–1.24)
GFR, Estimated: >60 mL/min (ref >60)
Glucose, Bld: 83 mg/dL (ref 70–99)
Potassium: 4.5 mmol/L (ref 3.5–5.1)
Sodium: 131 mmol/L — ABNORMAL LOW (ref 135–145)

## 2023-11-14 MED ORDER — TAMSULOSIN HCL 0.4 MG PO CAPS
0.4000 mg | ORAL_CAPSULE | Freq: Every day | ORAL | 0 refills | Status: AC
  Filled 2023-11-14 – 2024-02-15 (×2): qty 30, 30d supply, fill #0

## 2023-11-14 MED ORDER — LOSARTAN POTASSIUM 50 MG PO TABS: 50.0000 mg | ORAL_TABLET | Freq: Every day | ORAL | 0 refills | Status: DC

## 2023-11-14 MED ORDER — LOSARTAN POTASSIUM 50 MG PO TABS
50.0000 mg | ORAL_TABLET | Freq: Every day | ORAL | 0 refills | Status: DC
  Filled 2023-11-14: qty 90, 90d supply, fill #0

## 2023-11-14 MED ORDER — POLYETHYLENE GLYCOL 3350 17 G PO PACK: 17.0000 g | PACK | Freq: Every day | ORAL | 0 refills | Status: DC

## 2023-11-14 NOTE — Discharge Summary (Signed)
 Physician Discharge Summary   Patient: Lawrence Bennett MRN: [id number] DOB: 1955/10/03  Admit date:     11/10/2023  Discharge date: 11/14/2023  Discharge Physician: Junita Oliva   PCP: Eliodoro Guerin, DO   Recommendations at discharge:    Discharge to home Follow up with PCP in 7-10 days. Have chemistry drawn at that visit to be reported to PCP.  Discharge Diagnoses: Principal Problem:   Hyponatremia Active Problems:   Essential hypertension   Anxiety   Major depressive disorder, recurrent episode, moderate (HCC)   BPH (benign prostatic hyperplasia)   HLD (hyperlipidemia)   History of stroke   Acute hyponatremia  Resolved Problems:   * No resolved hospital problems. *  Hospital Course: Lawrence Bennett is a 68 y.o. male with medical history significant for history of CVA on Plavix , HTN, HLD, AAA s/p EVAR 08/2021, BPD, depression/anxiety, hard of hearing who is admitted with hyponatremia.  Assessment and Plan: Acute hyponatremia Likely multifactorial to include primarily volume depletion/very poor intake compounded by SSRIs as well as diuretic - urine sodium is 16 which is not consistent with SIADH -steadily improving with gentle hydration and avoidance of hydrochlorothiazide  -stop IV fluid this evening and recheck BMET in a.m.   Acute kidney injury Creatinine 1.33 at presentation with baseline creatinine 1.0 -likely simply prerenal azotemia -renal function has normalized with simple volume expansion   Constipation Have increase Miralax to bid, senna to 2 capsules and added lactulose. Pt will be discharged after he has had 2 large BM's.   Bipolar disorder - depression/anxiety continue usual home medications    Multiple falls at home Likely due to baseline deconditioning and exacerbated by volume depletion and hyponatremia - PT/OT suggest home health assistance. Will check orthostatic vitals today.   HTN Discontinue hydrochlorothiazide  - continue olmesartan  and  amlodipine  -blood pressure well-controlled. Patient is normotensive. Will check orthostatics. The patient is complaining of feeling sluggish and tired and is afraid that his BP is too low.    History of CVA Continue Plavix  and atorvastatin  -CT head with no acute intracranial abnormality appreciated noting chronic small vessel disease and old left basal ganglia small vessel infarct   HLD Continue atorvastatin    BPH Continue Flomax    Obesity Class 2 - Body mass index is 38.31 kg/m.       Consultants: None Procedures performed: None  Disposition: Home Diet recommendation:  Discharge Diet Orders (From admission, onward)     Start     Ordered   11/14/23 0000  Diet - low sodium heart healthy        11/14/23 0916           Cardiac and Carb modified diet DISCHARGE MEDICATION: Allergies as of 11/14/2023   No Known Allergies      Medication List     STOP taking these medications    Olmesartan -amLODIPine -HCTZ 40-10-12.5 MG Tabs   Ozempic  (1 MG/DOSE) 4 MG/3ML Sopn Generic drug: Semaglutide  (1 MG/DOSE)   promethazine -dextromethorphan 6.25-15 MG/5ML syrup Commonly known as: PROMETHAZINE -DM   Wegovy  0.25 MG/0.5ML Soaj Generic drug: Semaglutide -Weight Management   zinc gluconate 50 MG tablet       TAKE these medications    atorvastatin  80 MG tablet Commonly known as: LIPITOR  Take 1 tablet (80 mg total) by mouth daily.   clopidogrel  75 MG tablet Commonly known as: PLAVIX  Take 1 tablet by mouth once daily.   divalproex  500 MG 24 hr tablet Commonly known as: DEPAKOTE  ER Take 1 tablet (500 mg) by mouth  at bedtime.   LORazepam  0.5 MG tablet Commonly known as: ATIVAN  Take 1 tablet (0.5 mg) by mouth 3 times daily.   losartan  50 MG tablet Commonly known as: COZAAR  Take 1 tablet (50 mg total) by mouth daily.   PARoxetine  20 MG tablet Commonly known as: PAXIL  Take 1 tablet (20 mg total) by mouth at bedtime.   polyethylene glycol 17 g packet Commonly known  as: MIRALAX / GLYCOLAX Take 17 g by mouth daily.   QUEtiapine  50 MG tablet Commonly known as: SEROQUEL  Take 1 tablet (50 mg total) by mouth at bedtime.   tamsulosin  0.4 MG Caps capsule Commonly known as: FLOMAX  Take 1 capsule (0.4 mg total) by mouth at bedtime. What changed: when to take this   traZODone  50 MG tablet Commonly known as: DESYREL  Take 1 tablet (50 mg total) by mouth at bedtime.   venlafaxine  XR 75 MG 24 hr capsule Commonly known as: EFFEXOR -XR Take 1 capsule (75 mg total) by mouth daily with breakfast.        Follow-up Information     [facility] Follow up.   Specialty: Home Health Services Why: [facility] will provide home health services.  They will call you in the next 24-48 hours to set up services. Contact information: [address] [place] [address] [phone]         [facility], [facility] Follow up.   Why: [facility] will provide a 3:1 to your bedside before you are discharged to home. Contact information: [address] [place] [address] [phone]                Discharge Exam: Lawrence Bennett Weights   11/10/23 1520  Weight: 121.1 kg   Exam:  Constitutional:  The patient is awake, alert, and oriented x 3. No acute distress. Respiratory:  No increased work of breathing. No wheezes, rales, or rhonchi No tactile fremitus Cardiovascular:  Regular rate and rhythm No murmurs, ectopy, or gallups. No lateral PMI. No thrills. Abdomen:  Abdomen is soft, non-tender, non-distended No hernias, masses, or organomegaly Normoactive bowel sounds.  Musculoskeletal:  No cyanosis, clubbing, or edema Skin:  No rashes, lesions, ulcers palpation of skin: no induration or nodules Neurologic:  CN 2-12 intact Sensation all 4 extremities intact Psychiatric:  Mental status Mood, affect appropriate Orientation to person, place, time  judgment and insight appear intact   Condition at  discharge: fair  The results of significant diagnostics from this hospitalization (including imaging, microbiology, ancillary and laboratory) are listed below for reference.   Imaging Studies: CT HEAD WO CONTRAST ( ) Result Date: 11/11/2023 CLINICAL DATA:  Acute neurologic deficit EXAM: CT HEAD WITHOUT CONTRAST TECHNIQUE: Contiguous axial images were obtained from the base of the skull through the vertex without intravenous contrast. RADIATION DOSE REDUCTION: This exam was performed according to the departmental dose-optimization program which includes automated exposure control, adjustment of the mA and/or kV according to patient size and/or use of iterative reconstruction technique. COMPARISON:  11/10/2014 FINDINGS: Brain: There is no mass, hemorrhage or extra-axial collection. The size and configuration of the ventricles and extra-axial CSF spaces are normal. There is hypoattenuation of the white matter, most commonly indicating chronic small vessel disease. Old left basal ganglia small vessel infarct. Vascular: No hyperdense vessel or unexpected vascular calcification. Skull: The visualized skull base, calvarium and extracranial soft tissues are normal. Sinuses/Orbits: No fluid levels or advanced mucosal thickening of the visualized paranasal sinuses. No mastoid or middle ear  effusion. Normal orbits. Other: None. IMPRESSION: 1. No acute intracranial abnormality. 2. Chronic small vessel disease and old left basal ganglia small vessel infarct. Electronically Signed   By: Juanetta Nordmann M.D.   On: 11/11/2023 21:53   DG HIP UNILAT WITH PELVIS 2-3 VIEWS RIGHT Result Date: 11/10/2023 CLINICAL DATA:  Right hip pain. EXAM: DG HIP (WITH OR WITHOUT PELVIS) 3V RIGHT COMPARISON:  None Available. FINDINGS: There is bilateral hip degenerative change with joint space narrowing and small osteophytes. Pelvic ring is intact. No acute fracture, dislocation or subluxation. No osteolytic or osteoblastic lesions.  IMPRESSION: Mild bilateral hip degenerative changes. No acute osseous abnormalities. Electronically Signed   By: Sydell Eva M.D.   On: 11/10/2023 17:33    Microbiology: Results for orders placed or performed during the hospital encounter of 08/18/21  Surgical PCR Screen     Status: None   Collection Time: 08/18/21  3:19 PM   Specimen: Nasal Mucosa; Nasal Swab  Result Value Ref Range Status   MRSA, PCR NEGATIVE NEGATIVE Final   Staphylococcus aureus NEGATIVE NEGATIVE Final    Comment: (NOTE) The Xpert SA Assay (FDA approved for NASAL specimens in patients 51 years of age and older), is one component of a comprehensive surveillance program. It is not intended to diagnose infection nor to guide or monitor treatment. Performed at [facility], [address], [place], [place] [address]   SARS CORONAVIRUS 2 (TAT 6-24 HRS) Nasopharyngeal Nasopharyngeal Swab     Status: None   Collection Time: 08/18/21  3:20 PM   Specimen: Nasopharyngeal Swab  Result Value Ref Range Status   SARS Coronavirus 2 NEGATIVE NEGATIVE Final    Comment: (NOTE) SARS-CoV-2 target nucleic acids are NOT DETECTED.  The SARS-CoV-2 RNA is generally detectable in upper and lower respiratory specimens during the acute phase of infection. Negative results do not preclude SARS-CoV-2 infection, do not rule out co-infections with other pathogens, and should not be used as the sole basis for treatment or other patient management decisions. Negative results must be combined with clinical observations, patient history, and epidemiological information. The expected result is Negative.  Fact Sheet for Patients: HairSlick.no  Fact Sheet for Healthcare Providers: quierodirigir.com  This test is not yet approved or cleared by the [place]  FDA and  has been authorized for detection and/or diagnosis of SARS-CoV-2 by FDA under an Emergency Use Authorization  (EUA). This EUA will remain  in effect (meaning this test can be used) for the duration of the COVID-19 declaration under Se ction 564(b)(1) of the Act, 21 U.S.C. section 360bbb-3(b)(1), unless the authorization is terminated or revoked sooner.  Performed at [facility], [address] [place], [place] [address]     Labs: CBC: Recent Labs  Lab 11/10/23 1705 11/11/23 0202 11/14/23 0730  WBC 8.9 7.9 6.9  NEUTROABS 6.1  --  3.4  HGB 13.2 13.5 10.9*  HCT 37.9* 38.4* 32.0*  MCV 85.7 85.7 89.1  PLT 272 265 247   Basic Metabolic Panel: Recent Labs  Lab 11/10/23 1705 11/10/23 2230 11/11/23 0202 11/12/23 0634 11/13/23 0631 11/14/23 0730  NA 119* 119* 120* 126* 132* 131*  K 4.7  --  4.0 4.6 4.7 4.5  CL 84*  --  85* 97* 103 100  CO2 21*  --  24 23 17* 23  GLUCOSE 109*  --  93 94 85 83  BUN 14  --  13 11 10 9   CREATININE 1.33*  --  1.25* 1.10  1.20 1.15  CALCIUM  9.0  --  9.3 8.7* 8.8* 8.8*   Liver Function Tests: Recent Labs  Lab 11/10/23 1705  AST 26  ALT 20  ALKPHOS 61  BILITOT 1.0  PROT 6.4*  ALBUMIN 3.6   CBG: No results for input(s): "GLUCAP" in the last 168 hours.  Discharge time spent: greater than 30 minutes.  Signed: Algenis Ballin, DO [facility] 11/14/2023

## 2023-11-14 NOTE — Progress Notes (Signed)
 DISCHARGE NOTE HOME JOSEJULIAN HEPBURN to be discharged Home per MD order. Discussed prescriptions and follow up appointments with the patient. Prescriptions given to patient; medication list explained in detail. Patient verbalized understanding.  Skin clean, dry and intact without evidence of skin break down, no evidence of skin tears noted. IV catheter discontinued intact. Site without signs and symptoms of complications. Dressing and pressure applied. Pt denies pain at the site currently. No complaints noted.  See LDA bilateral knee wounds at discharge  An After Visit Summary (AVS) was printed and given to the patient. Patient escorted via wheelchair, and discharged home via private auto.  Tonda Francisco, RN

## 2023-11-14 NOTE — Plan of Care (Signed)

## 2023-11-14 NOTE — Care Management Obs Status (Signed)
 MEDICARE OBSERVATION STATUS NOTIFICATION   Patient Details  Name: MERRICK BEMUS MRN: [id number] Date of Birth: [birth year]   Medicare Observation Status Notification Given:       Wynonia Hedges 11/14/2023, 10:16 AM

## 2023-11-14 NOTE — Care Management Important Message (Signed)
 Important Message  Patient Details  Name: Lawrence Bennett MRN: [id number] Date of Birth: 11-23-1955   Important Message Given:  Yes - Medicare IM    Patient left prior to IM delivery will mail a copy to the patient home address  Wynonia Hedges 11/14/2023, 10:17 AM

## 2023-11-15 ENCOUNTER — Telehealth: Payer: Self-pay

## 2023-11-15 NOTE — Transitions of Care (Post Inpatient/ED Visit) (Signed)
   11/15/2023  Name: Lawrence Bennett MRN: [id number] DOB: 03/23/1956  Today's TOC FU Call Status: Today's TOC FU Call Status:: Unsuccessful Call (1st Attempt) Unsuccessful Call (1st Attempt) Date: 11/15/23  Attempted to reach the patient regarding the most recent Inpatient/ED visit.  Follow Up Plan: Additional outreach attempts will be made to reach the patient to complete the Transitions of Care (Post Inpatient/ED visit) call.   Signature Darrall Ellison, LPN [facility] Nurse Health Advisor Direct Dial [phone]

## 2023-11-18 ENCOUNTER — Telehealth (HOSPITAL_COMMUNITY): Payer: Self-pay | Admitting: *Deleted

## 2023-11-18 ENCOUNTER — Ambulatory Visit (HOSPITAL_COMMUNITY)
Admission: EM | Admit: 2023-11-18 | Discharge: 2023-11-18 | Disposition: A | Discharge status: Home / Self Care | Attending: Psychiatry | Admitting: Psychiatry

## 2023-11-18 DIAGNOSIS — F419 Anxiety disorder, unspecified: Secondary | ICD-10-CM | POA: Insufficient documentation

## 2023-11-18 DIAGNOSIS — H9190 Unspecified hearing loss, unspecified ear: Secondary | ICD-10-CM | POA: Insufficient documentation

## 2023-11-18 DIAGNOSIS — G479 Sleep disorder, unspecified: Secondary | ICD-10-CM

## 2023-11-18 DIAGNOSIS — Z79899 Other long term (current) drug therapy: Secondary | ICD-10-CM | POA: Insufficient documentation

## 2023-11-18 DIAGNOSIS — G4700 Insomnia, unspecified: Secondary | ICD-10-CM | POA: Diagnosis not present

## 2023-11-18 MED ORDER — HYDROXYZINE HCL 25 MG PO TABS
50.0000 mg | ORAL_TABLET | ORAL | Status: AC
  Administered 2023-11-18: 50 mg via ORAL
  Filled 2023-11-18: qty 2

## 2023-11-18 NOTE — Telephone Encounter (Signed)
 Patient wife called stating patient has been having some falls due to Low sodium levels. He's been having some problem and would like to have a visit with provider. Due HIPAA, staff can not speak with wife. Staff called patient to get further details and was not able to reach him. Staff [facility] for patient to call office back to schedule an appt with provider.

## 2023-11-18 NOTE — ED Provider Notes (Signed)
 Behavioral Health Urgent Care Medical Screening Exam  Patient Name: Lawrence Bennett MRN: [id number] Date of Evaluation: 11/19/23 Chief Complaint:  increase anxiety  Diagnosis:  Final diagnoses:  Medication dose changed  Sleep disturbance  Anxious mood    History of Present illness: Lawrence Bennett is a 68 y.o. male.  He has a history of generalized anxiety, and insomnia.  Presented to [facility] voluntarily accompanied by his wife and daughter.  Per the patient he is trying to get his medication either increase or change because he does not think that is effective enough.  According to the patient he was in the hospital due to a fall that he had because he had a low sodium level and they told him that it was due to one of the medicines that he was taking for his medical condition.  Patient stated he currently sees a psychiatrist but he has an appointment on 5-25 with Dr. Alberteen Huge, MD in [place] .  According to the patient he is currently taking Ativan  0.5 mg 3 times a day but he is still having increased anxiety.  Patient also states that since he came out of the hospital he has been having sleep disturbance where he is not getting a full night sleep.  Patient currently lives with his wife and daughter.  At this present moment patient does not seem to be in any distress does not seem to pose a risk to himself or others.  Face-to-face observation of patient, patient is alert and oriented x 4, speech is clear, maintain eye contact.  Patient is hard of hearing and does use a device to assist him to hear appropriately.  However patient is pleasant and does not appear to be in any distress does not appear to be a threat to himself or others.  Patient denies SI, HI, AVH or paranoia.  Denies alcohol use, denies illicit drug use denies smoking.  Denies access to guns.  Discussed with patient that he needs to explain this to his provider when he goes for the appointment on Wednesday.  I also  Clinical research associate discussed with patient that given the fact that he is already seeing a provider and he is currently taking Ativan  0.5 mg he could increase the dose by 0.5 mg for tonight and then when he sees his provider on Wednesday discussed with the provider and the provider would have to make that decision whether or not to change his regiment.  As it relates to patient insomnia Clinical research associate discussed with patient that we could give him a one-time dose of hydroxyzine  50 mg and then he would need to discuss this also with the provider to see if the provider wants to keep him on this or if they want to change it to something different.  Both wife and husband is in agreement and getting the one-time dose of hydroxyzine  50 mg.  At the time of this evaluation patient is advised to call 911 or return to the nearest ED should he experience any suicidal ideation homicidal thoughts or hallucination.  At the time of this patient patient does not pose a risk to himself or others and can safely be discharged to follow-up with his provider on 11/20/23.   Meds ordered this encounter  Medications   hydrOXYzine  (ATARAX ) tablet 50 mg     Recommend discharge for pt to f/u with outpatient provide on 11/20/23  Flowsheet Row ED from 11/18/2023 in [facility] ED to Hosp-Admission (Discharged) from  11/10/2023 in [facility] Unit Pre-Admission Testing 60 from 08/18/2021 in [facility] PREADMISSION TESTING  C-SSRS RISK CATEGORY No Risk No Risk No Risk       Psychiatric Specialty Exam  Presentation  General Appearance:Casual  Eye Contact:Good  Speech:Clear and Coherent  Speech Volume:Normal  Handedness:Right   Mood and Affect  Mood: Anxious  Affect: Congruent   Thought Process  Thought Processes: Coherent  Descriptions of Associations:Intact  Orientation:Full (Time, Place and Person)  Thought Content:WDL    Hallucinations:None  Ideas of  Reference:None  Suicidal Thoughts:No  Homicidal Thoughts:No   Sensorium  Memory: Immediate Good  Judgment: Fair  Insight: Fair   Art therapist  Concentration: Good  Attention Span: Good  Recall: Good  Fund of Knowledge: Good  Language: Good   Psychomotor Activity  Psychomotor Activity: Normal   Assets  Assets: Communication Skills; Social Support   Sleep  Sleep: Poor  Number of hours:  4   Physical Exam: Physical Exam HENT:     Head: Normocephalic.     Nose: Nose normal.  Eyes:     Pupils: Pupils are equal, round, and reactive to light.  Cardiovascular:     Rate and Rhythm: Normal rate.  Pulmonary:     Effort: Pulmonary effort is normal.  Musculoskeletal:        General: Normal range of motion.     Cervical back: Normal range of motion.  Neurological:     General: No focal deficit present.     Mental Status: He is alert.  Psychiatric:        Mood and Affect: Mood normal.        Thought Content: Thought content normal.    Review of Systems  Constitutional: Negative.   HENT: Negative.    Eyes: Negative.   Respiratory: Negative.    Cardiovascular: Negative.   Gastrointestinal: Negative.   Genitourinary: Negative.   Musculoskeletal: Negative.   Skin: Negative.   Neurological: Negative.   Psychiatric/Behavioral:  The patient is nervous/anxious and has insomnia.    There were no vitals taken for this visit. There is no height or weight on file to calculate BMI.  Musculoskeletal: Strength & Muscle Tone: within normal limits Gait & Station: normal Patient leans: N/A   [facility] MSE Discharge Disposition for Follow up and Recommendations: Based on my evaluation the patient does not appear to have an emergency medical condition and can be discharged with resources and follow up care in outpatient services for Medication Management   Dorthea Gauze, NP 11/19/2023, 5:16 AM

## 2023-11-18 NOTE — Progress Notes (Signed)
   11/18/23 1935  [facility] Triage Screening (Walk-ins at [facility] [place] [facility] only)  How Did You Hear About Us ? Family/Friend  What Is the Reason for Your Visit/Call Today? Lawrence Bennett is a 68 year old male presenting as a voluntary walk-in to [facility] due to medication management.  Patient denies SI, HI, psychosis and alcohol/drug usage. Patient reports he was recently in the hospital for a bad fall due to low sodium and released this past Thursday. Patient reports he was taken off 2 depression medications during that time. Patient shared with daughter "I don't have the will to live", however patient reports he would not hurt himself and that he was frustrated due to situation. Patient has an appointment on 11/20/23, Tuesday with Dr. Alberteen Huge at [facility] in [place] for medication management regarding his psych medications.  How Long Has This Been Causing You Problems? <Week  Have You Recently Had Any Thoughts About Hurting Yourself? No  Are You Planning to Commit Suicide/Harm Yourself At This time? No  Have you Recently Had Thoughts About Hurting Someone Lawrence Bennett? No  Are You Planning To Harm Someone At This Time? No  Physical Abuse Denies  Verbal Abuse Denies  Sexual Abuse Denies  Exploitation of patient/patient's resources Denies  Self-Neglect Denies  Possible abuse reported to:  (n/a)  Are you currently experiencing any auditory, visual or other hallucinations? No  Have You Used Any Alcohol or Drugs in the Past 24 Hours? No  Do you have any current medical co-morbidities that require immediate attention? No  Clinician description of patient physical appearance/behavior: neat / cooperative  What Do You Feel Would Help You the Most Today? Medication(s);Treatment for Depression or other mood problem  If access to [facility]gent [facility] was not available, would you have sought care in the Emergency Department? No  Determination of Need Routine (7 days)  Options For Referral Medication Management;Outpatient Therapy     Flowsheet Row ED from 11/18/2023 in [facility] ED to Hosp-Admission (Discharged) from 11/10/2023 in [place] [facility] [place] [facility] Pre-Admission Testing 60 from 08/18/2021 in [facility] PREADMISSION TESTING  C-SSRS RISK CATEGORY No Risk No Risk No Risk

## 2023-11-18 NOTE — Telephone Encounter (Signed)
 If he is falling do to low sodium he needs to see PCP rather than me

## 2023-11-19 DIAGNOSIS — I10 Essential (primary) hypertension: Secondary | ICD-10-CM | POA: Diagnosis not present

## 2023-11-19 DIAGNOSIS — I2510 Atherosclerotic heart disease of native coronary artery without angina pectoris: Secondary | ICD-10-CM | POA: Diagnosis not present

## 2023-11-19 DIAGNOSIS — F32A Depression, unspecified: Secondary | ICD-10-CM | POA: Diagnosis not present

## 2023-11-19 DIAGNOSIS — Z79899 Other long term (current) drug therapy: Secondary | ICD-10-CM | POA: Diagnosis not present

## 2023-11-19 DIAGNOSIS — E790 Hyperuricemia without signs of inflammatory arthritis and tophaceous disease: Secondary | ICD-10-CM | POA: Diagnosis not present

## 2023-11-19 DIAGNOSIS — E1151 Type 2 diabetes mellitus with diabetic peripheral angiopathy without gangrene: Secondary | ICD-10-CM | POA: Diagnosis not present

## 2023-11-19 DIAGNOSIS — R0602 Shortness of breath: Secondary | ICD-10-CM | POA: Diagnosis not present

## 2023-11-19 DIAGNOSIS — R1013 Epigastric pain: Secondary | ICD-10-CM | POA: Diagnosis not present

## 2023-11-20 ENCOUNTER — Ambulatory Visit (HOSPITAL_COMMUNITY): Admitting: Psychiatry

## 2023-11-20 ENCOUNTER — Encounter (HOSPITAL_COMMUNITY): Payer: Self-pay | Admitting: Psychiatry

## 2023-11-20 ENCOUNTER — Other Ambulatory Visit (HOSPITAL_COMMUNITY): Payer: Self-pay

## 2023-11-20 VITALS — BP 138/88 | HR 90 | Ht 70.0 in | Wt 259.0 lb

## 2023-11-20 DIAGNOSIS — F331 Major depressive disorder, recurrent, moderate: Secondary | ICD-10-CM | POA: Diagnosis not present

## 2023-11-20 DIAGNOSIS — F3160 Bipolar disorder, current episode mixed, unspecified: Secondary | ICD-10-CM

## 2023-11-20 DIAGNOSIS — F419 Anxiety disorder, unspecified: Secondary | ICD-10-CM | POA: Diagnosis not present

## 2023-11-20 MED ORDER — LORAZEPAM 0.5 MG PO TABS
0.5000 mg | ORAL_TABLET | Freq: Three times a day (TID) | ORAL | 5 refills | Status: DC
  Filled 2023-11-20 – 2023-11-22 (×2): qty 90, 30d supply, fill #0

## 2023-11-20 MED ORDER — QUETIAPINE FUMARATE 50 MG PO TABS
50.0000 mg | ORAL_TABLET | Freq: Every day | ORAL | 2 refills | Status: DC
  Filled 2023-11-20: qty 90, 90d supply, fill #0

## 2023-11-20 MED ORDER — HYDROXYZINE PAMOATE 50 MG PO CAPS
50.0000 mg | ORAL_CAPSULE | Freq: Every evening | ORAL | 2 refills | Status: DC
  Filled 2023-11-20: qty 30, 30d supply, fill #0
  Filled 2023-12-18: qty 30, 30d supply, fill #1

## 2023-11-20 MED ORDER — VENLAFAXINE HCL ER 150 MG PO CP24
150.0000 mg | ORAL_CAPSULE | Freq: Every day | ORAL | 2 refills | Status: DC
  Filled 2023-11-20: qty 30, 30d supply, fill #0
  Filled 2023-12-18: qty 30, 30d supply, fill #1

## 2023-11-20 MED ORDER — PAROXETINE HCL 20 MG PO TABS
20.0000 mg | ORAL_TABLET | Freq: Every day | ORAL | 2 refills | Status: DC
  Filled 2023-11-20: qty 90, 90d supply, fill #0

## 2023-11-20 MED ORDER — DIVALPROEX SODIUM ER 500 MG PO TB24
500.0000 mg | ORAL_TABLET | Freq: Every day | ORAL | 2 refills | Status: DC
  Filled 2023-11-20: qty 90, 90d supply, fill #0

## 2023-11-20 NOTE — Transitions of Care (Post Inpatient/ED Visit) (Signed)
   11/20/2023  Name: Lawrence Bennett MRN: [id number] DOB: [birth year]  Today's TOC FU Call Status: Today's TOC FU Call Status:: Unsuccessful Call (3rd Attempt) Unsuccessful Call (1st Attempt) Date: 11/15/23 Unsuccessful Call (3rd Attempt) Date: 11/20/23 [facility] FU Call Complete Date: 11/18/23  Attempted to reach the patient regarding the most recent Inpatient/ED visit.  Follow Up Plan: No further outreach attempts will be made at this time. We have been unable to contact the patient.  Signature Darrall Ellison, LPN [facility] Nurse Health Advisor Direct Dial [phone]

## 2023-11-20 NOTE — Patient Instructions (Signed)
 STay out of bed during the day Contact home health PT

## 2023-11-20 NOTE — Progress Notes (Signed)
 BH MD/PA/NP OP Progress Note  11/20/2023 5:07 PM Lawrence Bennett  MRN:  [id number]  Chief Complaint:  Chief Complaint  Patient presents with   Depression   Anxiety   Follow-up   HPI:  This patient is a 68 year old married white male who lives with his wife and 3 children in [place]. 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  Apparently he had a fall a couple of weeks ago in his driveway.  He was brought to the emergency room and then admitted because of hyponatremia.  His sodium had gotten down to 19.  While in the hospital his antidepressants were held for several days as well as the hydrochlorothiazide .  He had also been started on Wegovy  but probably only took it 1 time.  Since leaving the hospital last week he has become much more depressed.  His ambulation is poor and he is using a walker or cane or wheelchair.  His son brings him in today and states that he spends a lot of time in his bed shouting out and seem very distressed.  His sleep is erratic sometimes too much and sometimes not enough.  He has lost his let appetite up over the last several days.  I spoke to his daughter by phone.  She is a Engineer, civil (consulting) for cardiologist in fact he sees the same cardiologist.  She states that he was put back on the hydrochlorothiazide  by the cardiologist and I was concerned that this might restart the hyponatremia.  I was fortunately able to speak to the cardiologist and he has agreed to stop it at least for now.  He is on other antihypertensives.  The patient is very worried that he is going to go back to the state he was in before he had to have ECT for 10 years ago.  He does seem a bit confused but part of this might be his hearing loss.  He denies any current thoughts of suicidal ideation but states that these thoughts have gone through his mind.  He states he would not do  anything to harm himself.  I explained that he is body and mind have been through a lot going through the hyponatremia etc.  I am in agreement with increasing his venlafaxine  and trying to see him more frequently.  He also needs to participate in physical therapy. Visit Diagnosis:    ICD-10-CM   1. Bipolar I disorder, most recent episode mixed (HCC)  F31.60     2. Major depressive disorder, recurrent episode, moderate (HCC)  F33.1     3. Anxiety  F41.9       Past Psychiatric History: Hospitalization the geriatric psychiatry unit in 2015 for severe depression, requiring ECT  Past Medical History:  Past Medical History:  Diagnosis Date   AAA (abdominal aortic aneurysm) (HCC)    Anxiety    Atherosclerosis of aorta (HCC)    Atrial contractions, premature    Depression    Dyslipidemia    Dyspnea    Elevated glucose 11/11/2015   Hyperlipidemia    Mild hypertension    Stroke [facility])    Urinary hesitancy     Past Surgical History:  Procedure Laterality Date   ABDOMINAL AORTIC ENDOVASCULAR STENT GRAFT Bilateral 08/21/2021   Procedure: ENDOVASCULAR AORTIC STENT GRAFT REPAIR;  Surgeon: Kayla Part, MD;  Location[facility] OR;  Service:  Vascular;  Laterality: Bilateral;   COLON RESECTION  2012   ULTRASOUND GUIDANCE FOR VASCULAR ACCESS Bilateral 08/21/2021   Procedure: ULTRASOUND GUIDANCE FOR VASCULAR ACCESS;  Surgeon: Kayla Part, MD;  Location: [facility] OR;  Service: Vascular;  Laterality: Bilateral;    Family Psychiatric History: See below  Family History:  Family History  Problem Relation Age of Onset   COPD Mother    Stroke Father    Bipolar disorder Father    Hypertension Sister    Depression Sister    Colon cancer Neg Hx    Esophageal cancer Neg Hx    Inflammatory bowel disease Neg Hx    Liver disease Neg Hx    Rectal cancer Neg Hx    Pancreatic cancer Neg Hx    Stomach cancer Neg Hx     Social History:  Social History   Socioeconomic History   Marital status: Married     Spouse name: Not on file   Number of children: 3   Years of education: Not on file   Highest education level: Not on file  Occupational History   Occupation: retired  Tobacco Use   Smoking status: Former    Current packs/day: 0.00    Average packs/day: 0.2 packs/day for 19.0 years (3.8 ttl pk-yrs)    Types: Cigarettes    Start date: 20    Quit date: 2015    Years since quitting: 10.3   Smokeless tobacco: Never  Vaping Use   Vaping status: Never Used  Substance and Sexual Activity   Alcohol use: No    Alcohol/week: 0.0 standard drinks of alcohol   Drug use: No   Sexual activity: Not Currently  Other Topics Concern   Not on file  Social History Narrative   Right handed   Lives with wife   Social Drivers of Health   Financial Resource Strain: Low Risk  (05/22/2023)   Overall Financial Resource Strain (CARDIA)    Difficulty of Paying Living Expenses: Not hard at all  Food Insecurity: No Food Insecurity (11/11/2023)   Hunger Vital Sign    Worried About Running Out of Food in the Last Year: Never true    Ran Out of Food in the Last Year: Never true  Transportation Needs: No Transportation Needs (11/11/2023)   PRAPARE - Administrator, Civil Service (Medical): No    Lack of Transportation (Non-Medical): No  Physical Activity: Insufficiently Active (05/22/2023)   Exercise Vital Sign    Days of Exercise per Week: 7 days    Minutes of Exercise per Session: 20 min  Stress: No Stress Concern Present (05/22/2023)   [employer] of Occupational Health - Occupational Stress Questionnaire    Feeling of Stress : Not at all  Social Connections: Moderately Integrated (11/11/2023)   Social Connection and Isolation Panel [NHANES]    Frequency of Communication with Friends and Family: More than three times a week    Frequency of Social Gatherings with Friends and Family: More than three times a week    Attends Religious Services: More than 4 times per year    Active  Member of [employer] or Organizations: No    Attends Banker Meetings: Never    Marital Status: Married    Allergies: No Known Allergies  Metabolic Disorder Labs: Lab Results  Component Value Date   HGBA1C 6.4 (H) 06/24/2023   No results found for: "PROLACTIN" Lab Results  Component Value Date   CHOL 149 06/24/2023  TRIG 92 06/24/2023   HDL 47 06/24/2023   CHOLHDL 3.2 06/24/2023   LDLCALC 85 06/24/2023   LDLCALC 73 04/18/2022   Lab Results  Component Value Date   TSH 1.396 11/12/2023   TSH 2.010 06/24/2023    Therapeutic Level Labs: No results found for: "LITHIUM" Lab Results  Component Value Date   VALPROATE 65.3 11/21/2022   VALPROATE 41.8 (L) 11/29/2016   No results found for: "CBMZ"  Current Medications: Current Outpatient Medications  Medication Sig Dispense Refill   hydrOXYzine  (VISTARIL ) 50 MG capsule Take 1 capsule (50 mg total) by mouth at bedtime. 30 capsule 2   venlafaxine  XR (EFFEXOR  XR) 150 MG 24 hr capsule Take 1 capsule (150 mg total) by mouth daily with breakfast. 30 capsule 2   atorvastatin  (LIPITOR ) 80 MG tablet Take 1 tablet (80 mg total) by mouth daily. 90 tablet 3   clopidogrel  (PLAVIX ) 75 MG tablet Take 1 tablet by mouth once daily. 30 tablet 11   divalproex  (DEPAKOTE  ER) 500 MG 24 hr tablet Take 1 tablet (500 mg) by mouth at bedtime. 90 tablet 2   LORazepam  (ATIVAN ) 0.5 MG tablet Take 1 tablet (0.5 mg) by mouth 3 times daily. 90 tablet 5   losartan  (COZAAR ) 50 MG tablet Take 1 tablet (50 mg total) by mouth daily. 90 tablet 0   PARoxetine  (PAXIL ) 20 MG tablet Take 1 tablet (20 mg total) by mouth at bedtime. 90 tablet 2   polyethylene glycol (MIRALAX  / GLYCOLAX ) 17 g packet Take 17 g by mouth daily. 42 each 0   QUEtiapine  (SEROQUEL ) 50 MG tablet Take 1 tablet (50 mg total) by mouth at bedtime. 90 tablet 2   tamsulosin  (FLOMAX ) 0.4 MG CAPS capsule Take 1 capsule (0.4 mg total) by mouth at bedtime. 30 capsule 0   traZODone  (DESYREL ) 50 MG  tablet Take 1 tablet (50 mg total) by mouth at bedtime. 90 tablet 2   No current facility-administered medications for this visit.     Musculoskeletal: Strength & Muscle Tone: Unsteady on his feet Gait & Station: unsteady Patient leans: N/A  Psychiatric Specialty Exam: Review of Systems  Psychiatric/Behavioral:  Positive for confusion, dysphoric mood and sleep disturbance. The patient is nervous/anxious.   All other systems reviewed and are negative.   Blood pressure 138/88, pulse 90, height 5\' 10"  (1.778 m), weight 259 lb (117.5 kg), SpO2 92%.Body mass index is 37.16 kg/m.  General Appearance: Casual and Fairly Groomed sitting in a wheelchair  Eye Contact:  Good  Speech:  Clear and Coherent  Volume:  Normal  Mood:  Depressed  Affect:  Congruent and Flat  Thought Process:  Goal Directed  Orientation:  Full (Time, Place, and Person)  Thought Content: Rumination   Suicidal Thoughts:  No  Homicidal Thoughts:  No  Memory:  Immediate;   Good Recent;   Fair Remote;   Poor  Judgement:  Fair  Insight:  Shallow  Psychomotor Activity:  Decreased  Concentration:  Concentration: Poor and Attention Span: Poor  Recall:  Fair  Fund of Knowledge: Good  Language: Good  Akathisia:  No  Handed:  Right  AIMS (if indicated): not done  Assets:  Communication Skills Desire for Improvement Resilience Social Support  ADL's:  Impaired  Cognition: Impaired,  Mild  Sleep:  Poor   Screenings: GAD-7    Loss adjuster, chartered Office Visit from 10/31/2022 in [facility] [place] Office Visit from 07/31/2022 in [facility] Office Visit  from 05/15/2022 in [facility] Office Visit from 10/12/2021 in [facility] Office Visit from 04/11/2021 in [facility]  Total GAD-7 Score 0 0 0 0 0      PHQ2-9    Flowsheet Row Clinical Support from  05/22/2023 in [facility] Office Visit from 10/31/2022 in [facility] [place] Office Visit from 07/31/2022 in [facility] Office Visit from 05/15/2022 in [facility] Video Visit from 04/12/2022 in [facility] Total Score 0 0 0 0 0  PHQ-9 Total Score 0 0 0 -- --      Flowsheet Row ED from 11/18/2023 in [facility] ED to Hosp-Admission (Discharged) from 11/10/2023 in [facility] Medical Unit Pre-Admission Testing 60 from 08/18/2021 in [facility] PREADMISSION TESTING  C-SSRS RISK CATEGORY No Risk No Risk No Risk        Assessment and Plan: This patient is a 68 year old male with a history of severe depression in the past.  Since the hospitalization for hyponatremia he has become more depressed and slightly confused.  He did have a normal brain CT.  We will increase venlafaxine  to 150 mg daily for depression.  He will continue Depakote  ER 500 mg at bedtime for mood stabilization.  Instead of trazodone  he will use hydroxyzine  50 mg at bedtime for sleep.  He will continue lorazepam  0.5 mg 3 times daily for anxiety and Paxil  20 mg at bedtime for depression as well as Seroquel  50 mg at bedtime for mood stabilization.  He will return to see me in 2 weeks  Collaboration of Care: Collaboration of Care: Other provider involved in patient's care AEB collaboration today was done with his cardiologist  Patient/Guardian was advised Release of Information must be obtained prior to any record release in order to collaborate their care with an outside provider. Patient/Guardian was advised if they have not already done so to contact the registration department to sign all necessary forms in order for us  to release information regarding their care.   Consent: Patient/Guardian gives  verbal consent for treatment and assignment of benefits for services provided during this visit. Patient/Guardian expressed understanding and agreed to proceed.    [name], MD 11/20/2023, 5:07 PM

## 2023-11-21 ENCOUNTER — Other Ambulatory Visit (HOSPITAL_COMMUNITY): Payer: Self-pay

## 2023-11-21 MED ORDER — AMLODIPINE-OLMESARTAN 10-40 MG PO TABS
1.0000 | ORAL_TABLET | Freq: Every day | ORAL | 2 refills | Status: DC
  Filled 2023-11-21: qty 30, 30d supply, fill #0
  Filled 2023-12-25: qty 30, 30d supply, fill #1
  Filled 2024-01-21: qty 30, 30d supply, fill #2

## 2023-11-22 ENCOUNTER — Other Ambulatory Visit (HOSPITAL_COMMUNITY): Payer: Self-pay

## 2023-11-25 ENCOUNTER — Ambulatory Visit (HOSPITAL_COMMUNITY)
Admission: EM | Admit: 2023-11-25 | Discharge: 2023-11-25 | Disposition: A | Discharge status: Home / Self Care | Attending: Psychiatry | Admitting: Psychiatry

## 2023-11-25 DIAGNOSIS — F5101 Primary insomnia: Secondary | ICD-10-CM

## 2023-11-25 DIAGNOSIS — R630 Anorexia: Secondary | ICD-10-CM | POA: Diagnosis not present

## 2023-11-25 DIAGNOSIS — G4700 Insomnia, unspecified: Secondary | ICD-10-CM | POA: Diagnosis not present

## 2023-11-25 DIAGNOSIS — F411 Generalized anxiety disorder: Secondary | ICD-10-CM | POA: Diagnosis not present

## 2023-11-25 DIAGNOSIS — Z79899 Other long term (current) drug therapy: Secondary | ICD-10-CM | POA: Diagnosis not present
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
## 2023-11-25 NOTE — ED Provider Notes (Signed)
 Behavioral Health Urgent Care Medical Screening Exam  Patient Name: Lawrence Bennett MRN: [id number] Date of Evaluation: 11/26/23 Chief Complaint:  decrease appetite and sleep Diagnosis:  Final diagnoses:  Medication dose changed  Primary insomnia  Decreased appetite    History of Present illness: Lawrence Bennett is a 68 y.o. male. With a history of GAD and insomnia, presented to [facility] voluntarily accompanied by his wife.  Per the wife patient is waking up throughout the night and yelling.  According to the wife patient saw his psychiatrist and they took him off of the trazodone  and kept him on the hydroxyzine  50 mg nightly but she thinks he might need an increase because he is not sleeping as well.  Patient wife's also stated that patient appetite has decreased.  And she just thinks that something else is going on.  Patient recently saw his cardiologist who according to the wife had done numerous blood work but they are waiting for the results.  Writer discussed with patient and his wife the need to reach out to the PCP to see if they can refer the patient to neurology for a workup.  According to patient wife patient did see a neurologist a couple months ago so she can easily reach back out to him in the coming days.    A face-to-face evaluation of patient, patient is alert and oriented x 4, speech is clear, maintain eye contact.  Patient does not appear to be in any immediate distress.  Patient denies SI, HI, AVH or paranoia denies access to guns denied wanting to hurt himself or others.  Denies alcohol use denies illicit drug use denies smoking.  Patient at this present moment does not seem to be influenced by internal or external stimuli.    Discuss with both patient and wife the need to discuss with the patient PCP the need for referral to their neurologist. At the time of this assessment pt did not pose a wrist to himself or other.  They were advice to call 911 or return to the nearest ED  should he experience any SI/HI or hallucination.  Patient verbalize understanding.    Recommend discharge for them to f/u with PCP  Flowsheet Row ED from 11/25/2023 in [facility] ED from 11/18/2023 in [facility] ED to Hosp-Admission (Discharged) from 11/10/2023 in [place] [facility] [place] [facility]  C-SSRS RISK CATEGORY No Risk No Risk No Risk       Psychiatric Specialty Exam  Presentation  General Appearance:Casual  Eye Contact:Good  Speech:Clear and Coherent  Speech Volume:Normal  Handedness:Right   Mood and Affect  Mood: Anxious  Affect: Congruent   Thought Process  Thought Processes: Coherent  Descriptions of Associations:Intact  Orientation:Full (Time, Place and Person)  Thought Content:Logical    Hallucinations:None  Ideas of Reference:None  Suicidal Thoughts:No  Homicidal Thoughts:No   Sensorium  Memory: Immediate Fair  Judgment: Fair  Insight: Fair   Executive Functions  Concentration: Good  Attention Span: Good  Recall: Good  Fund of Knowledge: Good  Language: Good   Psychomotor Activity  Psychomotor Activity: Normal   Assets  Assets: Desire for Improvement   Sleep  Sleep: Poor  Number of hours:  5   Physical Exam: Physical Exam HENT:     Head: Normocephalic.     Nose: Nose normal.  Eyes:     Pupils: Pupils are equal, round, and reactive to light.  Cardiovascular:     Rate and  Rhythm: Normal rate.  Pulmonary:     Effort: Pulmonary effort is normal.  Musculoskeletal:        General: Normal range of motion.     Cervical back: Normal range of motion.  Neurological:     General: No focal deficit present.     Mental Status: He is alert.  Psychiatric:        Mood and Affect: Mood normal.    Review of Systems  Constitutional: Negative.   HENT: Negative.    Eyes: Negative.   Respiratory: Negative.    Cardiovascular: Negative.    Gastrointestinal: Negative.   Genitourinary: Negative.   Musculoskeletal: Negative.   Skin: Negative.   Neurological: Negative.   Psychiatric/Behavioral:  Positive for memory loss. The patient has insomnia.    Blood pressure 137/78, pulse 89, temperature 98.7 F (37.1 C), temperature source Oral, resp. rate 20, SpO2 99%. There is no height or weight on file to calculate BMI.  Musculoskeletal: Strength & Muscle Tone: within normal limits Gait & Station: normal Patient leans: N/A   [facility] MSE Discharge Disposition for Follow up and Recommendations: Based on my evaluation the patient does not appear to have an emergency medical condition and can be discharged with resources and follow up care in outpatient services for Medication Management   Dorthea Gauze, NP 11/26/2023, 5:44 AM

## 2023-11-25 NOTE — Discharge Instructions (Signed)
 F/u with PCP.

## 2023-11-25 NOTE — ED Notes (Signed)
 Patient A&O x 4, ambulatory. Patient discharged in no acute distress. Patient denied SI/HI, A/VH upon discharge. Patient verbalized understanding of all discharge instructions reviewed on AVS via staff, to include follow up appointments, and safety. Patient escorted to lobby via staff for transport to destination. Safety maintained.

## 2023-11-26 ENCOUNTER — Ambulatory Visit: Payer: Self-pay

## 2023-11-26 ENCOUNTER — Other Ambulatory Visit: Payer: Self-pay

## 2023-11-26 ENCOUNTER — Ambulatory Visit: Admitting: Family Medicine

## 2023-11-26 ENCOUNTER — Other Ambulatory Visit (HOSPITAL_COMMUNITY): Payer: Self-pay

## 2023-11-26 ENCOUNTER — Ambulatory Visit (INDEPENDENT_AMBULATORY_CARE_PROVIDER_SITE_OTHER): Admitting: Psychiatry

## 2023-11-26 ENCOUNTER — Encounter (HOSPITAL_COMMUNITY): Payer: Self-pay | Admitting: Psychiatry

## 2023-11-26 VITALS — BP 119/77 | HR 98 | Ht 70.0 in | Wt 251.0 lb

## 2023-11-26 DIAGNOSIS — F419 Anxiety disorder, unspecified: Secondary | ICD-10-CM | POA: Diagnosis not present

## 2023-11-26 DIAGNOSIS — F331 Major depressive disorder, recurrent, moderate: Secondary | ICD-10-CM

## 2023-11-26 DIAGNOSIS — F3160 Bipolar disorder, current episode mixed, unspecified: Secondary | ICD-10-CM

## 2023-11-26 MED ORDER — LORAZEPAM 1 MG PO TABS
1.0000 mg | ORAL_TABLET | Freq: Three times a day (TID) | ORAL | 2 refills | Status: DC
  Filled 2023-11-26: qty 90, 30d supply, fill #0
  Filled 2023-12-25: qty 90, 30d supply, fill #1

## 2023-11-26 MED ORDER — QUETIAPINE FUMARATE 100 MG PO TABS
100.0000 mg | ORAL_TABLET | Freq: Two times a day (BID) | ORAL | 2 refills | Status: DC
  Filled 2023-11-26: qty 60, 30d supply, fill #0
  Filled 2023-12-25: qty 60, 30d supply, fill #1

## 2023-11-26 NOTE — Telephone Encounter (Signed)
 Appt scheduled today

## 2023-11-26 NOTE — Progress Notes (Signed)
 BH MD/PA/NP OP Progress Note  11/26/2023 11:02 AM Lawrence Bennett  MRN:  [id number]  Chief Complaint:  Chief Complaint  Patient presents with   Anxiety   Depression   Follow-up   HPI: This patient is a 68 year old married white male lives with his wife and 3 children in [place].  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  The patient returns for follow-up as a work in after 6 days regarding his bipolar disorder.  Since I saw him last week he has had more episodes of shouting and being very distressed.  He is not sleeping well.  His wife states that he is barely eating and he has lost 8 pounds since last week.  He is barely drinking fluids.  He went back to the behavioral health urgent care center yesterday but they dismissed him stating he needed to see primary care and neurology.  It was per my is that he might be having an onset of dementia but his decline happened precipitously so I do not think this is the case.  He seems to be going back into severe depression.  On presenting today he is still in a wheelchair.  He states his legs are weak.  He seems distracted and not to understand a lot of the questions and have to have them repeated several times.  He is also very hard of hearing.  He claims he just does not feel hungry and does not have any appetite I explained that he is good to approach just like a medication and eat because it helps his body.  He sleeps okay but often wakes up and shouts and screams.  This morning he states "I cannot do it anymore" according to his wife.  However he denies any sort of suicidal plan.  For now we can increase his Seroquel  at bedtime as well as increase the Ativan  dose for his anxiety.  Ultimately I think he best be served by getting into a geropsych unit for a complete evaluation.  As there are very few beds in the area will have to look around and see what we can find for him. Visit Diagnosis:     ICD-10-CM   1. Bipolar I disorder, most recent episode mixed (HCC)  F31.60     2. Major depressive disorder, recurrent episode, moderate (HCC)  F33.1     3. Anxiety  F41.9       Past Psychiatric History: Hospitalization in a Geri psych unit in 2015 for severe depression, requiring ECT  Past Medical History:  Past Medical History:  Diagnosis Date   AAA (abdominal aortic aneurysm) (HCC)    Anxiety    Atherosclerosis of aorta (HCC)    Atrial contractions, premature    Depression    Dyslipidemia    Dyspnea    Elevated glucose 11/11/2015   Hyperlipidemia    Mild hypertension    Stroke [facility])    Urinary hesitancy     Past Surgical History:  Procedure Laterality Date   ABDOMINAL AORTIC ENDOVASCULAR STENT GRAFT Bilateral 08/21/2021   Procedure: ENDOVASCULAR AORTIC STENT GRAFT REPAIR;  Surgeon: Kayla Part, MD;  Location: [facility] OR;  Service: Vascular;  Laterality: Bilateral;   COLON RESECTION  2012   ULTRASOUND GUIDANCE FOR VASCULAR ACCESS Bilateral 08/21/2021   Procedure: ULTRASOUND GUIDANCE FOR VASCULAR ACCESS;  Surgeon: Kayla Part, MD;  Location: [facility] OR;  Service: Vascular;  Laterality: Bilateral;    Family  Psychiatric History: See below  Family History:  Family History  Problem Relation Age of Onset   COPD Mother    Stroke Father    Bipolar disorder Father    Hypertension Sister    Depression Sister    Colon cancer Neg Hx    Esophageal cancer Neg Hx    Inflammatory bowel disease Neg Hx    Liver disease Neg Hx    Rectal cancer Neg Hx    Pancreatic cancer Neg Hx    Stomach cancer Neg Hx     Social History:  Social History   Socioeconomic History   Marital status: Married    Spouse name: Not on file   Number of children: 3   Years of education: Not on file   Highest education level: Not on file  Occupational History   Occupation: retired  Tobacco Use   Smoking status: Former    Current packs/day: 0.00    Average packs/day: 0.2 packs/day for 19.0 years  (3.8 ttl pk-yrs)    Types: Cigarettes    Start date: 57    Quit date: 2015    Years since quitting: 10.4   Smokeless tobacco: Never  Vaping Use   Vaping status: Never Used  Substance and Sexual Activity   Alcohol use: No    Alcohol/week: 0.0 standard drinks of alcohol   Drug use: No   Sexual activity: Not Currently  Other Topics Concern   Not on file  Social History Narrative   Right handed   Lives with wife   Social Drivers of Health   Financial Resource Strain: Low Risk  (05/22/2023)   Overall Financial Resource Strain (CARDIA)    Difficulty of Paying Living Expenses: Not hard at all  Food Insecurity: No Food Insecurity (11/11/2023)   Hunger Vital Sign    Worried About Running Out of Food in the Last Year: Never true    Ran Out of Food in the Last Year: Never true  Transportation Needs: No Transportation Needs (11/11/2023)   PRAPARE - Administrator, Civil Service (Medical): No    Lack of Transportation (Non-Medical): No  Physical Activity: Insufficiently Active (05/22/2023)   Exercise Vital Sign    Days of Exercise per Week: 7 days    Minutes of Exercise per Session: 20 min  Stress: No Stress Concern Present (05/22/2023)   [employer] of Occupational Health - Occupational Stress Questionnaire    Feeling of Stress : Not at all  Social Connections: Moderately Integrated (11/11/2023)   Social Connection and Isolation Panel [NHANES]    Frequency of Communication with Friends and Family: More than three times a week    Frequency of Social Gatherings with Friends and Family: More than three times a week    Attends Religious Services: More than 4 times per year    Active Member of Clubs or Organizations: No    Attends Banker Meetings: Never    Marital Status: Married    Allergies: No Known Allergies  Metabolic Disorder Labs: Lab Results  Component Value Date   HGBA1C 6.4 (H) 06/24/2023   No results found for: "PROLACTIN" Lab Results   Component Value Date   CHOL 149 06/24/2023   TRIG 92 06/24/2023   HDL 47 06/24/2023   CHOLHDL 3.2 06/24/2023   LDLCALC 85 06/24/2023   LDLCALC 73 04/18/2022   Lab Results  Component Value Date   TSH 1.396 11/12/2023   TSH 2.010 06/24/2023    Therapeutic Level Labs:  No results found for: "LITHIUM" Lab Results  Component Value Date   VALPROATE 65.3 11/21/2022   VALPROATE 41.8 (L) 11/29/2016   No results found for: "CBMZ"  Current Medications: Current Outpatient Medications  Medication Sig Dispense Refill   LORazepam  (ATIVAN ) 1 MG tablet Take 1 tablet (1 mg total) by mouth 3 (three) times daily. 90 tablet 2   QUEtiapine  (SEROQUEL ) 100 MG tablet Take 1 tablet (100 mg total) by mouth 2 (two) times daily. 60 tablet 2   amLODipine -olmesartan  (AZOR) 10-40 MG tablet Take 1 tablet by mouth daily. 30 tablet 2   atorvastatin  (LIPITOR ) 80 MG tablet Take 1 tablet (80 mg total) by mouth daily. 90 tablet 3   clopidogrel  (PLAVIX ) 75 MG tablet Take 1 tablet by mouth once daily. 30 tablet 11   divalproex  (DEPAKOTE  ER) 500 MG 24 hr tablet Take 1 tablet (500 mg) by mouth at bedtime. 90 tablet 2   hydrOXYzine  (VISTARIL ) 50 MG capsule Take 1 capsule (50 mg total) by mouth at bedtime. 30 capsule 2   losartan  (COZAAR ) 50 MG tablet Take 1 tablet (50 mg total) by mouth daily. 90 tablet 0   PARoxetine  (PAXIL ) 20 MG tablet Take 1 tablet (20 mg total) by mouth at bedtime. 90 tablet 2   polyethylene glycol (MIRALAX  / GLYCOLAX ) 17 g packet Take 17 g by mouth daily. 42 each 0   tamsulosin  (FLOMAX ) 0.4 MG CAPS capsule Take 1 capsule (0.4 mg total) by mouth at bedtime. 30 capsule 0   venlafaxine  XR (EFFEXOR  XR) 150 MG 24 hr capsule Take 1 capsule (150 mg total) by mouth daily with breakfast. 30 capsule 2   No current facility-administered medications for this visit.     Musculoskeletal: Strength & Muscle Tone: decreased Gait & Station: unsteady Patient leans: N/A  Psychiatric Specialty Exam: Review  of Systems  Neurological:  Positive for weakness.  Psychiatric/Behavioral:  Positive for confusion, dysphoric mood and sleep disturbance. The patient is nervous/anxious.   All other systems reviewed and are negative.   Blood pressure 119/77, pulse 98, height 5\' 10"  (1.778 m), weight 251 lb (113.9 kg), SpO2 94%.Body mass index is 36.01 kg/m.  General Appearance: Casual and Disheveled  Eye Contact:  Fair  Speech:  Clear and Coherent  Volume:  Normal  Mood:  Depressed  Affect:  Flat  Thought Process:  Descriptions of Associations: Loose  Orientation:  Full (Time, Place, and Person)  Thought Content: Rumination   Suicidal Thoughts:  No  Homicidal Thoughts:  No  Memory:  Immediate;   Fair Recent;   Fair Remote;   NA  Judgement:  Impaired  Insight:  Lacking  Psychomotor Activity:  Decreased  Concentration:  Concentration: Poor and Attention Span: Poor  Recall:  Fair  Fund of Knowledge: Good  Language: Good  Akathisia:  No  Handed:  Right  AIMS (if indicated): not done  Assets:  Communication Skills Resilience Social Support  ADL's:  Impaired  Cognition: Impaired,  Mild  Sleep:  Poor   Screenings: GAD-7    Flowsheet Row Office Visit from 10/31/2022 in [facility] [place] Office Visit from 07/31/2022 in [facility] Office Visit from 05/15/2022 in [facility] Office Visit from 10/12/2021 in [facility] Office Visit from 04/11/2021 in [facility]  Total GAD-7 Score 0 0 0 0 0      PHQ2-9    Flowsheet Row Clinical  Support from 05/22/2023 in [facility] Office Visit from 10/31/2022 in [facility] [place] Office Visit from 07/31/2022 in [facility] Office Visit from 05/15/2022 in [facility] Video Visit from 04/12/2022 in [facility] Total Score 0 0 0 0 0  PHQ-9 Total Score 0 0 0 -- --      Flowsheet Row ED from 11/25/2023 in [facility] ED from 11/18/2023 in [facility] ED to Hosp-Admission (Discharged) from 11/10/2023 in [place] [facility]  C-SSRS RISK CATEGORY No Risk No Risk No Risk        Assessment and Plan: This patient is a 68 year old male with a history of severe depression.  He was recently hospitalized after a fall with hyponatremia.  Since then he has been declining in his mental status.  For now we will continue venlafaxine  150 mg daily for depression, Depakote  ER 500 mg at bedtime for mood stabilization, increase Seroquel  to 100 mg at bedtime for mood stabilization and sleep and increase lorazepam  to 1 mg 3 times daily for anxiety.  He will continue Paxil  20 mg daily for depression.  He will return to see me in 1 week if we have not found a place for him to go as an inpatient.  Collaboration of Care: Collaboration of Care: Other I will try to find at inpatient geropsychiatry placement for this patient  Patient/Guardian was advised Release of Information must be obtained prior to any record release in order to collaborate their care with an outside provider. Patient/Guardian was advised if they have not already done so to contact the registration department to sign all necessary forms in order for us  to release information regarding their care.   Consent: Patient/Guardian gives verbal consent for treatment and assignment of benefits for services provided during this visit. Patient/Guardian expressed understanding and agreed to proceed.    [name], MD 11/26/2023, 11:02 AM

## 2023-11-26 NOTE — Telephone Encounter (Signed)
 Chief Complaint: depression Symptoms: depressed, sleeping a lot Frequency: months Pertinent Negatives: Patient denies suicide Disposition: [] ED /[] Urgent Care (no appt availability in office) / [x] Appointment(In office/virtual)/ []  [place] [facility]/ [] Home Care/ [] Refused Recommended Disposition /[] [place] [facility]/ []  Follow-up with PCP  Additional Notes: States wife  patient was recently seen at [facility] ED  for his anxiety. States pt has a rough night and is currently sleep. States pt was hospitalized on 11/10/23 for a fall. States pt says he can't do it anymore, but is not suicidal but just depressed. States he is sleeping a lot and constantly apolgizing for putting burden on his family.  Reason for Disposition  Patient sounds very upset or troubled to the triager  [1] Depression AND [2] worsening (e.g., sleeping poorly, less able to do activities of daily living)  Protocols used: Anxiety and Panic Attack-A-AH, Depression-A-AH

## 2023-11-26 NOTE — Telephone Encounter (Signed)
 Copied from CRM [id number]. Topic: Clinical - Red Word Triage >> Nov 26, 2023  8:05 AM Julie Oddi wrote: Red Word that prompted transfer to Nurse Triage: Fall 1 month ago, currently experiencing depression/mental health issues   Call disconnected during transfer; 1 attempt at call back, left vm.

## 2023-11-28 ENCOUNTER — Telehealth (HOSPITAL_COMMUNITY): Payer: Self-pay | Admitting: Psychiatry

## 2023-11-28 ENCOUNTER — Other Ambulatory Visit: Payer: Self-pay | Admitting: Neurology

## 2023-11-28 DIAGNOSIS — E785 Hyperlipidemia, unspecified: Secondary | ICD-10-CM | POA: Diagnosis not present

## 2023-11-28 DIAGNOSIS — F32A Depression, unspecified: Secondary | ICD-10-CM | POA: Diagnosis not present

## 2023-11-28 DIAGNOSIS — R1013 Epigastric pain: Secondary | ICD-10-CM | POA: Diagnosis not present

## 2023-11-28 DIAGNOSIS — K5900 Constipation, unspecified: Secondary | ICD-10-CM | POA: Diagnosis not present

## 2023-11-28 DIAGNOSIS — R0602 Shortness of breath: Secondary | ICD-10-CM | POA: Diagnosis not present

## 2023-11-28 DIAGNOSIS — I10 Essential (primary) hypertension: Secondary | ICD-10-CM | POA: Diagnosis not present

## 2023-11-28 DIAGNOSIS — I2510 Atherosclerotic heart disease of native coronary artery without angina pectoris: Secondary | ICD-10-CM | POA: Diagnosis not present

## 2023-11-28 DIAGNOSIS — E1151 Type 2 diabetes mellitus with diabetic peripheral angiopathy without gangrene: Secondary | ICD-10-CM | POA: Diagnosis not present

## 2023-11-28 NOTE — Telephone Encounter (Signed)
 Pts wife was contacted regarding possible admission to a [facility] psych unit.  I had reached out to the [facility] inpatient admissions.  They stated that in order to get into the either their geriatric psychiatry unit or MedPsych unit he would not need to go to the emergency department at [facility].  The same thing was true for [facility] psych unit.  The wife voiced understanding.  She states however today he is doing somewhat better and is not crying out or moaning and they are going to be meeting with a physical therapist to do in-home therapy.  I gave her the option to go to any of these emergency rooms if his condition worsened and she voiced understanding

## 2023-12-02 DIAGNOSIS — E785 Hyperlipidemia, unspecified: Secondary | ICD-10-CM | POA: Diagnosis not present

## 2023-12-02 DIAGNOSIS — R0602 Shortness of breath: Secondary | ICD-10-CM | POA: Diagnosis not present

## 2023-12-02 DIAGNOSIS — K5900 Constipation, unspecified: Secondary | ICD-10-CM | POA: Diagnosis not present

## 2023-12-02 DIAGNOSIS — R1013 Epigastric pain: Secondary | ICD-10-CM | POA: Diagnosis not present

## 2023-12-02 DIAGNOSIS — I10 Essential (primary) hypertension: Secondary | ICD-10-CM | POA: Diagnosis not present

## 2023-12-02 DIAGNOSIS — F32A Depression, unspecified: Secondary | ICD-10-CM | POA: Diagnosis not present

## 2023-12-02 DIAGNOSIS — I2510 Atherosclerotic heart disease of native coronary artery without angina pectoris: Secondary | ICD-10-CM | POA: Diagnosis not present

## 2023-12-02 DIAGNOSIS — E1151 Type 2 diabetes mellitus with diabetic peripheral angiopathy without gangrene: Secondary | ICD-10-CM | POA: Diagnosis not present

## 2023-12-02 NOTE — Telephone Encounter (Signed)
 Called and left a detailed message per Dr Ty Gales. Told to call for questions or concerns.

## 2023-12-04 ENCOUNTER — Ambulatory Visit (HOSPITAL_COMMUNITY): Admitting: Psychiatry

## 2023-12-05 DIAGNOSIS — I10 Essential (primary) hypertension: Secondary | ICD-10-CM | POA: Diagnosis not present

## 2023-12-05 DIAGNOSIS — E785 Hyperlipidemia, unspecified: Secondary | ICD-10-CM | POA: Diagnosis not present

## 2023-12-05 DIAGNOSIS — I2510 Atherosclerotic heart disease of native coronary artery without angina pectoris: Secondary | ICD-10-CM | POA: Diagnosis not present

## 2023-12-05 DIAGNOSIS — R0602 Shortness of breath: Secondary | ICD-10-CM | POA: Diagnosis not present

## 2023-12-05 DIAGNOSIS — R1013 Epigastric pain: Secondary | ICD-10-CM | POA: Diagnosis not present

## 2023-12-05 DIAGNOSIS — K5900 Constipation, unspecified: Secondary | ICD-10-CM | POA: Diagnosis not present

## 2023-12-05 DIAGNOSIS — F32A Depression, unspecified: Secondary | ICD-10-CM | POA: Diagnosis not present

## 2023-12-05 DIAGNOSIS — E1151 Type 2 diabetes mellitus with diabetic peripheral angiopathy without gangrene: Secondary | ICD-10-CM | POA: Diagnosis not present

## 2023-12-11 ENCOUNTER — Encounter (HOSPITAL_COMMUNITY): Payer: Self-pay | Admitting: Psychiatry

## 2023-12-11 ENCOUNTER — Ambulatory Visit (INDEPENDENT_AMBULATORY_CARE_PROVIDER_SITE_OTHER): Admitting: Psychiatry

## 2023-12-11 ENCOUNTER — Other Ambulatory Visit (HOSPITAL_COMMUNITY): Payer: Self-pay

## 2023-12-11 VITALS — BP 135/82 | HR 93 | Temp 97.7°F | Ht 71.0 in | Wt 246.0 lb

## 2023-12-11 DIAGNOSIS — F419 Anxiety disorder, unspecified: Secondary | ICD-10-CM

## 2023-12-11 DIAGNOSIS — F3160 Bipolar disorder, current episode mixed, unspecified: Secondary | ICD-10-CM | POA: Diagnosis not present

## 2023-12-11 DIAGNOSIS — F331 Major depressive disorder, recurrent, moderate: Secondary | ICD-10-CM

## 2023-12-11 NOTE — Progress Notes (Signed)
 [facility] MD/PA/NP OP Progress Note  12/11/2023 2:19 PM Lawrence Bennett  MRN:  [id number]  Chief Complaint:  Chief Complaint  Patient presents with   Anxiety   Depression   Follow-up   HPI: This patient is a 68 year old married white male lives with his wife and 3 children in [place]. 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  Last time he was not doing well his wife stated that he had been shouting crying out a lot very distressed he was not eating.  He was barely drinking fluids.  Last time we increased his Seroquel  as well as the Ativan  and he now seems to be doing much better.  He actually walked in with a walker today.  His son Aletha Hutching is with him and states that the patient has been eating well.  He is sleeping better at night and no longer screaming and shouting.  He is able to concentrate and watch some TV.  He is going outside to sit on the porch.  He still does not feel all that secure with his walking but is getting home physical therapy.  He denies any auditory or visual hallucinations although sometimes I think I hear the radio on.  His mood seems to be much improved.  He denies any thoughts of wanting to die or harm himself. Visit Diagnosis:    ICD-10-CM   1. Bipolar I disorder, most recent episode mixed (HCC)  F31.60     2. Major depressive disorder, recurrent episode, moderate (HCC)  F33.1     3. Anxiety  F41.9       Past Psychiatric History: Hospitalization in a Geri psych unit in 2015 for severe depression, requiring ECT  Past Medical History:  Past Medical History:  Diagnosis Date   AAA (abdominal aortic aneurysm) (HCC)    Anxiety    Atherosclerosis of aorta (HCC)    Atrial contractions, premature    Depression    Dyslipidemia    Dyspnea    Elevated glucose 11/11/2015   Hyperlipidemia    Mild hypertension    Stroke [facility])     Urinary hesitancy     Past Surgical History:  Procedure Laterality Date   ABDOMINAL AORTIC ENDOVASCULAR STENT GRAFT Bilateral 08/21/2021   Procedure: ENDOVASCULAR AORTIC STENT GRAFT REPAIR;  Surgeon: Kayla Part, MD;  Location: [facility] OR;  Service: Vascular;  Laterality: Bilateral;   COLON RESECTION  2012   ULTRASOUND GUIDANCE FOR VASCULAR ACCESS Bilateral 08/21/2021   Procedure: ULTRASOUND GUIDANCE FOR VASCULAR ACCESS;  Surgeon: Kayla Part, MD;  Location: [facility] OR;  Service: Vascular;  Laterality: Bilateral;    Family Psychiatric History: See below  Family History:  Family History  Problem Relation Age of Onset   COPD Mother    Stroke Father    Bipolar disorder Father    Hypertension Sister    Depression Sister    Colon cancer Neg Hx    Esophageal cancer Neg Hx    Inflammatory bowel disease Neg Hx    Liver disease Neg Hx    Rectal cancer Neg Hx    Pancreatic cancer Neg Hx    Stomach cancer Neg Hx     Social History:  Social History   Socioeconomic History   Marital status: Married    Spouse name: Not on file   Number of children: 3  Years of education: Not on file   Highest education level: Not on file  Occupational History   Occupation: retired  Tobacco Use   Smoking status: Former    Current packs/day: 0.00    Average packs/day: 0.2 packs/day for 19.0 years (3.8 ttl pk-yrs)    Types: Cigarettes    Start date: 84    Quit date: 2015    Years since quitting: 10.4   Smokeless tobacco: Never  Vaping Use   Vaping status: Never Used  Substance and Sexual Activity   Alcohol use: No    Alcohol/week: 0.0 standard drinks of alcohol   Drug use: No   Sexual activity: Not Currently  Other Topics Concern   Not on file  Social History Narrative   Right handed   Lives with wife   Social Drivers of Health   Financial Resource Strain: Low Risk  (05/22/2023)   Overall Financial Resource Strain (CARDIA)    Difficulty of Paying Living Expenses: Not hard at all   Food Insecurity: No Food Insecurity (11/11/2023)   Hunger Vital Sign    Worried About Running Out of Food in the Last Year: Never true    Ran Out of Food in the Last Year: Never true  Transportation Needs: No Transportation Needs (11/11/2023)   PRAPARE - Administrator, Civil Service (Medical): No    Lack of Transportation (Non-Medical): No  Physical Activity: Insufficiently Active (05/22/2023)   Exercise Vital Sign    Days of Exercise per Week: 7 days    Minutes of Exercise per Session: 20 min  Stress: No Stress Concern Present (05/22/2023)   [employer] of Occupational Health - Occupational Stress Questionnaire    Feeling of Stress : Not at all  Social Connections: Moderately Integrated (11/11/2023)   Social Connection and Isolation Panel [NHANES]    Frequency of Communication with Friends and Family: More than three times a week    Frequency of Social Gatherings with Friends and Family: More than three times a week    Attends Religious Services: More than 4 times per year    Active Member of Clubs or Organizations: No    Attends Banker Meetings: Never    Marital Status: Married    Allergies: No Known Allergies  Metabolic Disorder Labs: Lab Results  Component Value Date   HGBA1C 6.4 (H) 06/24/2023   No results found for: PROLACTIN Lab Results  Component Value Date   CHOL 149 06/24/2023   TRIG 92 06/24/2023   HDL 47 06/24/2023   CHOLHDL 3.2 06/24/2023   LDLCALC 85 06/24/2023   LDLCALC 73 04/18/2022   Lab Results  Component Value Date   TSH 1.396 11/12/2023   TSH 2.010 06/24/2023    Therapeutic Level Labs: No results found for: LITHIUM Lab Results  Component Value Date   VALPROATE 65.3 11/21/2022   VALPROATE 41.8 (L) 11/29/2016   No results found for: CBMZ  Current Medications: Current Outpatient Medications  Medication Sig Dispense Refill   amLODipine -olmesartan  (AZOR ) 10-40 MG tablet Take 1 tablet by mouth daily. 30  tablet 2   atorvastatin  (LIPITOR ) 80 MG tablet Take 1 tablet (80 mg total) by mouth daily. 90 tablet 3   clopidogrel  (PLAVIX ) 75 MG tablet Take 1 tablet by mouth once daily. 30 tablet 11   divalproex  (DEPAKOTE  ER) 500 MG 24 hr tablet Take 1 tablet (500 mg) by mouth at bedtime. 90 tablet 2   hydrOXYzine  (VISTARIL ) 50 MG capsule Take 1 capsule (50  mg total) by mouth at bedtime. 30 capsule 2   LORazepam  (ATIVAN ) 1 MG tablet Take 1 tablet (1 mg total) by mouth 3 (three) times daily. 90 tablet 2   losartan  (COZAAR ) 50 MG tablet Take 1 tablet (50 mg total) by mouth daily. 90 tablet 0   PARoxetine  (PAXIL ) 20 MG tablet Take 1 tablet (20 mg total) by mouth at bedtime. 90 tablet 2   polyethylene glycol (MIRALAX  / GLYCOLAX ) 17 g packet Take 17 g by mouth daily. 42 each 0   QUEtiapine  (SEROQUEL ) 100 MG tablet Take 1 tablet (100 mg total) by mouth 2 (two) times daily. 60 tablet 2   tamsulosin  (FLOMAX ) 0.4 MG CAPS capsule Take 1 capsule (0.4 mg total) by mouth at bedtime. 30 capsule 0   venlafaxine  XR (EFFEXOR  XR) 150 MG 24 hr capsule Take 1 capsule (150 mg total) by mouth daily with breakfast. 30 capsule 2   No current facility-administered medications for this visit.     Musculoskeletal: Strength & Muscle Tone: decreased Gait & Station: unsteady Patient leans: N/A  Psychiatric Specialty Exam: Review of Systems  Musculoskeletal:  Positive for gait problem.  Neurological:  Positive for weakness.  All other systems reviewed and are negative.   Blood pressure 135/82, pulse 93, temperature 97.7 F (36.5 C), temperature source Oral, height 5' 11 (1.803 m), weight 246 lb (111.6 kg), SpO2 96%.Body mass index is 34.31 kg/m.  General Appearance: Casual and Fairly Groomed  Eye Contact:  Good  Speech:  Clear and Coherent  Volume:  Normal  Mood:  Euthymic  Affect:  Appropriate  Thought Process:  Goal Directed  Orientation:  Full (Time, Place, and Person)  Thought Content: Rumination   Suicidal  Thoughts:  No  Homicidal Thoughts:  No  Memory:  Immediate;   Fair Recent;   Fair Remote;   NA  Judgement:  Poor  Insight:  Shallow  Psychomotor Activity:  Decreased  Concentration:  Concentration: Fair and Attention Span: Fair  Recall:  [employer] of Knowledge: Good  Language: Good  Akathisia:  No  Handed:  Right  AIMS (if indicated): not done  Assets:  Communication Skills Desire for Improvement Resilience Social Support  ADL's:  Impaired  Cognition: Impaired,  Mild  Sleep:  Good   Screenings: GAD-7    Flowsheet Row Office Visit from 10/31/2022 in [facility] [place] Office Visit from 07/31/2022 in [facility] Office Visit from 05/15/2022 in [facility] Office Visit from 10/12/2021 in [facility] Office Visit from 04/11/2021 in [facility]  Total GAD-7 Score 0 0 0 0 0      PHQ2-9    Flowsheet Row Clinical Support from 05/22/2023 in [facility] Office Visit from 10/31/2022 in [facility] [place] Office Visit from 07/31/2022 in [facility] Office Visit from 05/15/2022 in [facility] Video Visit from 04/12/2022 in [facility] Total Score 0 0 0 0 0  PHQ-9 Total Score 0 0 0 -- --      Flowsheet Row ED from 11/25/2023 in [facility] ED from 11/18/2023 in [facility] ED to Hosp-Admission (Discharged) from 11/10/2023 in [place] [facility] [place] [facility]  C-SSRS RISK CATEGORY No Risk No Risk No Risk  Assessment and Plan: This patient is a 68 year old male with a history of severe dementia.  He was hospitalized several weeks ago after a fall and severe hyponatremia.   After that his mental status and his weakness worsened.  However he is slowly getting better.  He seems like a different person from 2 weeks ago as he is much more ambulatory and responsive.  For now he will continue venlafaxine  150 mg daily for depression, Depakote  ER 500 mg at bedtime for mood stabilization, Seroquel  100 mg twice daily for mood stabilization, Paxil  20 mg daily for depression and Ativan  1 mg 3 times daily for anxiety.  He will return to see me in 4 weeks  Collaboration of Care: Collaboration of Care: Primary Care Provider AEB notes are shared with PCP on the epic system  Patient/Guardian was advised Release of Information must be obtained prior to any record release in order to collaborate their care with an outside provider. Patient/Guardian was advised if they have not already done so to contact the registration department to sign all necessary forms in order for us  to release information regarding their care.   Consent: Patient/Guardian gives verbal consent for treatment and assignment of benefits for services provided during this visit. Patient/Guardian expressed understanding and agreed to proceed.    [name], MD 12/11/2023, 2:19 PM

## 2023-12-12 DIAGNOSIS — E1151 Type 2 diabetes mellitus with diabetic peripheral angiopathy without gangrene: Secondary | ICD-10-CM | POA: Diagnosis not present

## 2023-12-12 DIAGNOSIS — R0602 Shortness of breath: Secondary | ICD-10-CM | POA: Diagnosis not present

## 2023-12-12 DIAGNOSIS — R1013 Epigastric pain: Secondary | ICD-10-CM | POA: Diagnosis not present

## 2023-12-12 DIAGNOSIS — F32A Depression, unspecified: Secondary | ICD-10-CM | POA: Diagnosis not present

## 2023-12-12 DIAGNOSIS — E785 Hyperlipidemia, unspecified: Secondary | ICD-10-CM | POA: Diagnosis not present

## 2023-12-12 DIAGNOSIS — K5900 Constipation, unspecified: Secondary | ICD-10-CM | POA: Diagnosis not present

## 2023-12-12 DIAGNOSIS — I2510 Atherosclerotic heart disease of native coronary artery without angina pectoris: Secondary | ICD-10-CM | POA: Diagnosis not present

## 2023-12-12 DIAGNOSIS — I10 Essential (primary) hypertension: Secondary | ICD-10-CM | POA: Diagnosis not present

## 2023-12-13 DIAGNOSIS — R1013 Epigastric pain: Secondary | ICD-10-CM | POA: Diagnosis not present

## 2023-12-13 DIAGNOSIS — E785 Hyperlipidemia, unspecified: Secondary | ICD-10-CM | POA: Diagnosis not present

## 2023-12-13 DIAGNOSIS — E1151 Type 2 diabetes mellitus with diabetic peripheral angiopathy without gangrene: Secondary | ICD-10-CM | POA: Diagnosis not present

## 2023-12-13 DIAGNOSIS — I2510 Atherosclerotic heart disease of native coronary artery without angina pectoris: Secondary | ICD-10-CM | POA: Diagnosis not present

## 2023-12-13 DIAGNOSIS — R0602 Shortness of breath: Secondary | ICD-10-CM | POA: Diagnosis not present

## 2023-12-13 DIAGNOSIS — K5900 Constipation, unspecified: Secondary | ICD-10-CM | POA: Diagnosis not present

## 2023-12-13 DIAGNOSIS — F32A Depression, unspecified: Secondary | ICD-10-CM | POA: Diagnosis not present

## 2023-12-13 DIAGNOSIS — I10 Essential (primary) hypertension: Secondary | ICD-10-CM | POA: Diagnosis not present

## 2023-12-19 ENCOUNTER — Other Ambulatory Visit (HOSPITAL_COMMUNITY): Payer: Self-pay

## 2023-12-20 DIAGNOSIS — E1151 Type 2 diabetes mellitus with diabetic peripheral angiopathy without gangrene: Secondary | ICD-10-CM | POA: Diagnosis not present

## 2023-12-20 DIAGNOSIS — I2510 Atherosclerotic heart disease of native coronary artery without angina pectoris: Secondary | ICD-10-CM | POA: Diagnosis not present

## 2023-12-20 DIAGNOSIS — I10 Essential (primary) hypertension: Secondary | ICD-10-CM | POA: Diagnosis not present

## 2023-12-20 DIAGNOSIS — R0602 Shortness of breath: Secondary | ICD-10-CM | POA: Diagnosis not present

## 2023-12-20 DIAGNOSIS — K5900 Constipation, unspecified: Secondary | ICD-10-CM | POA: Diagnosis not present

## 2023-12-20 DIAGNOSIS — R1013 Epigastric pain: Secondary | ICD-10-CM | POA: Diagnosis not present

## 2023-12-20 DIAGNOSIS — F32A Depression, unspecified: Secondary | ICD-10-CM | POA: Diagnosis not present

## 2023-12-20 DIAGNOSIS — E785 Hyperlipidemia, unspecified: Secondary | ICD-10-CM | POA: Diagnosis not present

## 2023-12-24 DIAGNOSIS — F32A Depression, unspecified: Secondary | ICD-10-CM | POA: Diagnosis not present

## 2023-12-24 DIAGNOSIS — E1151 Type 2 diabetes mellitus with diabetic peripheral angiopathy without gangrene: Secondary | ICD-10-CM | POA: Diagnosis not present

## 2023-12-24 DIAGNOSIS — I2510 Atherosclerotic heart disease of native coronary artery without angina pectoris: Secondary | ICD-10-CM | POA: Diagnosis not present

## 2023-12-24 DIAGNOSIS — K5900 Constipation, unspecified: Secondary | ICD-10-CM | POA: Diagnosis not present

## 2023-12-24 DIAGNOSIS — R0602 Shortness of breath: Secondary | ICD-10-CM | POA: Diagnosis not present

## 2023-12-24 DIAGNOSIS — I10 Essential (primary) hypertension: Secondary | ICD-10-CM | POA: Diagnosis not present

## 2023-12-24 DIAGNOSIS — R1013 Epigastric pain: Secondary | ICD-10-CM | POA: Diagnosis not present

## 2023-12-24 DIAGNOSIS — E785 Hyperlipidemia, unspecified: Secondary | ICD-10-CM | POA: Diagnosis not present

## 2023-12-26 ENCOUNTER — Telehealth (HOSPITAL_COMMUNITY): Payer: Self-pay | Admitting: *Deleted

## 2023-12-26 NOTE — Telephone Encounter (Signed)
 Lindajo with [facility] [phone]) called stating that patient voiced to her that his depression is worse. Per Lindajo, she would like to schedule follow up appt for patient. Staff informed her that appointment will have to be scheduled by patient or staff can call patient to schedule a sooner appt. Per Lindajo, wife is the one that schedules patient appointments and for staff to call wife. Staff informed Ida that per patient records, there is no updated release for office to speak with wife so staff will call patient to schedule appt. Ida verbalized understanding. Staff called patient number on file and was not able to reach him. Staff left message for patient to call office to schedule appointment. Office number was left on voicemail.

## 2023-12-27 ENCOUNTER — Ambulatory Visit: Payer: Self-pay | Admitting: Cardiology

## 2023-12-27 ENCOUNTER — Other Ambulatory Visit: Payer: Self-pay

## 2024-01-02 DIAGNOSIS — K5900 Constipation, unspecified: Secondary | ICD-10-CM | POA: Diagnosis not present

## 2024-01-02 DIAGNOSIS — F32A Depression, unspecified: Secondary | ICD-10-CM | POA: Diagnosis not present

## 2024-01-02 DIAGNOSIS — R1013 Epigastric pain: Secondary | ICD-10-CM | POA: Diagnosis not present

## 2024-01-02 DIAGNOSIS — R0602 Shortness of breath: Secondary | ICD-10-CM | POA: Diagnosis not present

## 2024-01-02 DIAGNOSIS — E785 Hyperlipidemia, unspecified: Secondary | ICD-10-CM | POA: Diagnosis not present

## 2024-01-02 DIAGNOSIS — E1151 Type 2 diabetes mellitus with diabetic peripheral angiopathy without gangrene: Secondary | ICD-10-CM | POA: Diagnosis not present

## 2024-01-02 DIAGNOSIS — I2510 Atherosclerotic heart disease of native coronary artery without angina pectoris: Secondary | ICD-10-CM | POA: Diagnosis not present

## 2024-01-02 DIAGNOSIS — I10 Essential (primary) hypertension: Secondary | ICD-10-CM | POA: Diagnosis not present

## 2024-01-08 ENCOUNTER — Telehealth (HOSPITAL_COMMUNITY): Admitting: Psychiatry

## 2024-01-08 ENCOUNTER — Other Ambulatory Visit: Payer: Self-pay

## 2024-01-08 ENCOUNTER — Other Ambulatory Visit (HOSPITAL_COMMUNITY): Payer: Self-pay

## 2024-01-08 ENCOUNTER — Encounter (HOSPITAL_COMMUNITY): Payer: Self-pay | Admitting: Psychiatry

## 2024-01-08 DIAGNOSIS — I2510 Atherosclerotic heart disease of native coronary artery without angina pectoris: Secondary | ICD-10-CM | POA: Diagnosis not present

## 2024-01-08 DIAGNOSIS — R0602 Shortness of breath: Secondary | ICD-10-CM | POA: Diagnosis not present

## 2024-01-08 DIAGNOSIS — I10 Essential (primary) hypertension: Secondary | ICD-10-CM | POA: Diagnosis not present

## 2024-01-08 DIAGNOSIS — F419 Anxiety disorder, unspecified: Secondary | ICD-10-CM

## 2024-01-08 DIAGNOSIS — E785 Hyperlipidemia, unspecified: Secondary | ICD-10-CM | POA: Diagnosis not present

## 2024-01-08 DIAGNOSIS — F3160 Bipolar disorder, current episode mixed, unspecified: Secondary | ICD-10-CM

## 2024-01-08 DIAGNOSIS — F331 Major depressive disorder, recurrent, moderate: Secondary | ICD-10-CM

## 2024-01-08 DIAGNOSIS — E1151 Type 2 diabetes mellitus with diabetic peripheral angiopathy without gangrene: Secondary | ICD-10-CM | POA: Diagnosis not present

## 2024-01-08 DIAGNOSIS — R1013 Epigastric pain: Secondary | ICD-10-CM | POA: Diagnosis not present

## 2024-01-08 DIAGNOSIS — K5900 Constipation, unspecified: Secondary | ICD-10-CM | POA: Diagnosis not present

## 2024-01-08 DIAGNOSIS — F32A Depression, unspecified: Secondary | ICD-10-CM | POA: Diagnosis not present

## 2024-01-08 MED ORDER — QUETIAPINE FUMARATE 100 MG PO TABS
100.0000 mg | ORAL_TABLET | Freq: Two times a day (BID) | ORAL | 2 refills | Status: DC
  Filled 2024-01-08 – 2024-01-21 (×2): qty 60, 30d supply, fill #0

## 2024-01-08 MED ORDER — PAROXETINE HCL 20 MG PO TABS
20.0000 mg | ORAL_TABLET | Freq: Every day | ORAL | 2 refills | Status: DC
  Filled 2024-01-08: qty 90, 90d supply, fill #0

## 2024-01-08 MED ORDER — HYDROXYZINE PAMOATE 50 MG PO CAPS
50.0000 mg | ORAL_CAPSULE | Freq: Every evening | ORAL | 2 refills | Status: DC
  Filled 2024-01-08 – 2024-01-14 (×2): qty 30, 30d supply, fill #0

## 2024-01-08 MED ORDER — DIVALPROEX SODIUM ER 500 MG PO TB24
500.0000 mg | ORAL_TABLET | Freq: Every day | ORAL | 2 refills | Status: DC
  Filled 2024-01-08: qty 90, 90d supply, fill #0

## 2024-01-08 MED ORDER — LORAZEPAM 1 MG PO TABS
1.0000 mg | ORAL_TABLET | Freq: Three times a day (TID) | ORAL | 2 refills | Status: DC
Start: 2024-01-08 — End: 2024-02-06
  Filled 2024-01-08 – 2024-01-27 (×3): qty 90, 30d supply, fill #0

## 2024-01-08 MED ORDER — VENLAFAXINE HCL ER 150 MG PO CP24
150.0000 mg | ORAL_CAPSULE | Freq: Every day | ORAL | 2 refills | Status: DC
Start: 2024-01-08 — End: 2024-02-06
  Filled 2024-01-08 – 2024-01-15 (×3): qty 30, 30d supply, fill #0

## 2024-01-08 NOTE — Progress Notes (Signed)
 Virtual Visit via Video Note  I connected with Lawrence Bennett on 01/08/24 at  3:20 PM EDT by a video enabled telemedicine application and verified that I am speaking with the correct person using two identifiers.  Location: Patient: home Provider: office   I discussed the limitations of evaluation and management by telemedicine and the availability of in person appointments. The patient expressed understanding and agreed to proceed.      I discussed the assessment and treatment plan with the patient. The patient was provided an opportunity to ask questions and all were answered. The patient agreed with the plan and demonstrated an understanding of the instructions.   The patient was advised to call back or seek an in-person evaluation if the symptoms worsen or if the condition fails to improve as anticipated.  I provided 20 minutes of non-face-to-face time during this encounter.   Barnie Gull, MD  [facility] MD/PA/NP OP Progress Note  01/08/2024 3:39 PM Lawrence Bennett  MRN:  [id number]  Chief Complaint:  Chief Complaint  Patient presents with   Anxiety   Depression   Follow-up   HPI: This patient is a 68 year old married white male lives with his wife and 3 children in [place]. 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  It is rather difficult to get a coherent history as he is still hearing impaired.  However he states that he is doing somewhat better.  He claims that he is waking up in the middle the night in a sweat.  I looked through his medicines I think the most likely suspect for causing this might be the paroxetine .  I told the daughter to move paroxetine  up to evening or noon.  She also states that he keeps his room pretty warm and maybe this needs to be adjusted.  He denies being depressed or suicidal or having auditory or visual  hallucinations.  He seems fairly coherent.  His daughter states that he is no longer yelling or screaming for help.  He is trying to walk some with his walker.  He did have his mild fall in his bedroom on a tile but other than that he has been ambulating pretty well.  He states that his appetite is good and he is eating well. Visit Diagnosis:    ICD-10-CM   1. Bipolar I disorder, most recent episode mixed (HCC)  F31.60     2. Major depressive disorder, recurrent episode, moderate (HCC)  F33.1     3. Anxiety  F41.9       Past Psychiatric History: Hospitalization in a Geri psych unit in 2015 for severe depression, requiring ECT  Past Medical History:  Past Medical History:  Diagnosis Date   AAA (abdominal aortic aneurysm) (HCC)    Anxiety    Atherosclerosis of aorta (HCC)    Atrial contractions, premature    Depression    Dyslipidemia    Dyspnea    Elevated glucose 11/11/2015   Hyperlipidemia    Mild hypertension    Stroke [facility])    Urinary hesitancy     Past Surgical History:  Procedure Laterality Date   ABDOMINAL AORTIC ENDOVASCULAR STENT GRAFT Bilateral 08/21/2021   Procedure: ENDOVASCULAR AORTIC STENT GRAFT REPAIR;  Surgeon: Lanis Fonda BRAVO, MD;  Location: [facility] OR;  Service: Vascular;  Laterality: Bilateral;   COLON RESECTION  2012  ULTRASOUND GUIDANCE FOR VASCULAR ACCESS Bilateral 08/21/2021   Procedure: ULTRASOUND GUIDANCE FOR VASCULAR ACCESS;  Surgeon: Lanis Fonda BRAVO, MD;  Location[facility] OR;  Service: Vascular;  Laterality: Bilateral;    Family Psychiatric History: See below  Family History:  Family History  Problem Relation Age of Onset   COPD Mother    Stroke Father    Bipolar disorder Father    Hypertension Sister    Depression Sister    Colon cancer Neg Hx    Esophageal cancer Neg Hx    Inflammatory bowel disease Neg Hx    Liver disease Neg Hx    Rectal cancer Neg Hx    Pancreatic cancer Neg Hx    Stomach cancer Neg Hx     Social History:  Social  History   Socioeconomic History   Marital status: Married    Spouse name: Not on file   Number of children: 3   Years of education: Not on file   Highest education level: Not on file  Occupational History   Occupation: retired  Tobacco Use   Smoking status: Former    Current packs/day: 0.00    Average packs/day: 0.2 packs/day for 19.0 years (3.8 ttl pk-yrs)    Types: Cigarettes    Start date: 89    Quit date: 2015    Years since quitting: 10.5   Smokeless tobacco: Never  Vaping Use   Vaping status: Never Used  Substance and Sexual Activity   Alcohol use: No    Alcohol/week: 0.0 standard drinks of alcohol   Drug use: No   Sexual activity: Not Currently  Other Topics Concern   Not on file  Social History Narrative   Right handed   Lives with wife   Social Drivers of Health   Financial Resource Strain: Low Risk  (05/22/2023)   Overall Financial Resource Strain (CARDIA)    Difficulty of Paying Living Expenses: Not hard at all  Food Insecurity: No Food Insecurity (11/11/2023)   Hunger Vital Sign    Worried About Running Out of Food in the Last Year: Never true    Ran Out of Food in the Last Year: Never true  Transportation Needs: No Transportation Needs (11/11/2023)   PRAPARE - Administrator, Civil Service (Medical): No    Lack of Transportation (Non-Medical): No  Physical Activity: Insufficiently Active (05/22/2023)   Exercise Vital Sign    Days of Exercise per Week: 7 days    Minutes of Exercise per Session: 20 min  Stress: No Stress Concern Present (05/22/2023)   [employer] of Occupational Health - Occupational Stress Questionnaire    Feeling of Stress : Not at all  Social Connections: Moderately Integrated (11/11/2023)   Social Connection and Isolation Panel    Frequency of Communication with Friends and Family: More than three times a week    Frequency of Social Gatherings with Friends and Family: More than three times a week    Attends  Religious Services: More than 4 times per year    Active Member of Clubs or Organizations: No    Attends Banker Meetings: Never    Marital Status: Married    Allergies: No Known Allergies  Metabolic Disorder Labs: Lab Results  Component Value Date   HGBA1C 6.4 (H) 06/24/2023   No results found for: PROLACTIN Lab Results  Component Value Date   CHOL 149 06/24/2023   TRIG 92 06/24/2023   HDL 47 06/24/2023   CHOLHDL 3.2 06/24/2023  LDLCALC 85 06/24/2023   LDLCALC 73 04/18/2022   Lab Results  Component Value Date   TSH 1.396 11/12/2023   TSH 2.010 06/24/2023    Therapeutic Level Labs: No results found for: LITHIUM Lab Results  Component Value Date   VALPROATE 65.3 11/21/2022   VALPROATE 41.8 (L) 11/29/2016   No results found for: CBMZ  Current Medications: Current Outpatient Medications  Medication Sig Dispense Refill   amLODipine -olmesartan  (AZOR ) 10-40 MG tablet Take 1 tablet by mouth daily. 30 tablet 2   atorvastatin  (LIPITOR ) 80 MG tablet Take 1 tablet (80 mg total) by mouth daily. 90 tablet 3   clopidogrel  (PLAVIX ) 75 MG tablet Take 1 tablet by mouth once daily. 30 tablet 11   divalproex  (DEPAKOTE  ER) 500 MG 24 hr tablet Take 1 tablet (500 mg) by mouth at bedtime. 90 tablet 2   hydrOXYzine  (VISTARIL ) 50 MG capsule Take 1 capsule (50 mg total) by mouth at bedtime. 30 capsule 2   LORazepam  (ATIVAN ) 1 MG tablet Take 1 tablet (1 mg total) by mouth 3 (three) times daily. 90 tablet 2   PARoxetine  (PAXIL ) 20 MG tablet Take 1 tablet (20 mg total) by mouth at bedtime. 90 tablet 2   QUEtiapine  (SEROQUEL ) 100 MG tablet Take 1 tablet (100 mg total) by mouth 2 (two) times daily. 60 tablet 2   tamsulosin  (FLOMAX ) 0.4 MG CAPS capsule Take 1 capsule (0.4 mg total) by mouth at bedtime. 30 capsule 0   venlafaxine  XR (EFFEXOR  XR) 150 MG 24 hr capsule Take 1 capsule (150 mg total) by mouth daily with breakfast. 30 capsule 2   No current facility-administered  medications for this visit.     Musculoskeletal: Strength & Muscle Tone: decreased Gait & Station: unsteady Patient leans: N/A  Psychiatric Specialty Exam: Review of Systems  HENT:  Positive for hearing loss.   Musculoskeletal:  Positive for gait problem.  Neurological:  Positive for weakness.  Psychiatric/Behavioral:  Positive for sleep disturbance.   All other systems reviewed and are negative.   There were no vitals taken for this visit.There is no height or weight on file to calculate BMI.  General Appearance: Casual and Fairly Groomed  Eye Contact:  Good  Speech:  Clear and Coherent  Volume:  Normal  Mood:  Euthymic  Affect:  Congruent  Thought Process:  Goal Directed  Orientation:  Other:  Person and place  Thought Content: WDL   Suicidal Thoughts:  No  Homicidal Thoughts:  No  Memory:  Immediate;   Good Recent;   Fair Remote;   NA  Judgement:  Poor  Insight:  Shallow  Psychomotor Activity:  Decreased  Concentration:  Concentration: Fair and Attention Span: Fair  Recall:  [employer] of Knowledge: Good  Language: Good  Akathisia:  No  Handed:  Right  AIMS (if indicated): not done  Assets:  Communication Skills Desire for Improvement Resilience Social Support  ADL's:  Impaired  Cognition: Impaired,  Mild  Sleep:  Fair   Screenings: GAD-7    Garment/textile technologist Visit from 10/31/2022 in [facility] [place] Office Visit from 07/31/2022 in [facility] Office Visit from 05/15/2022 in [facility] Office Visit from 10/12/2021 in [facility] Office Visit from 04/11/2021 in [facility]  Total GAD-7 Score 0 0 0 0 0   PHQ2-9    Flowsheet Row Clinical Support from 05/22/2023 in  [facility] Office Visit from 10/31/2022 in [facility]  [place] Office Visit from 07/31/2022 in [facility] Office Visit from 05/15/2022 in [facility] Video Visit from 04/12/2022 in [facility] Total Score 0 0 0 0 0  PHQ-9 Total Score 0 0 0 -- --   Flowsheet Row ED from 11/25/2023 in [facility] ED from 11/18/2023 in [facility] ED to Hosp-Admission (Discharged) from 11/10/2023 in [place] [place] [facility]  C-SSRS RISK CATEGORY No Risk No Risk No Risk     Assessment and Plan: This patient is a 68 year old male with a history of severe depression.  He was hospitalized a couple of months ago after a fall and he also had severe hyponatremia at that time.  After that his mental status and weakness worsened.  He is slowly getting better.  He is more ambulatory.  His mood has improved considerably according to his daughter.  The Paxil  might be contributing to the excessive sweating so I have asked him not to take it at bedtime but to move it up to noon or dinnertime at the 20 mg dosage.  He will continue venlafaxine  150 mg daily for depression, Depakote  ER 500 mg at bedtime for mood stabilization, Seroquel  100 mg twice daily for mood stabilization and Ativan  1 mg 3 times daily for anxiety.  He will return to see me in 4 weeks  Collaboration of Care: Collaboration of Care: Primary Care Provider AEB notes that shared with PCP on the epic system  Patient/Guardian was advised Release of Information must be obtained prior to any record release in order to collaborate their care with an outside provider. Patient/Guardian was advised if they have not already done so to contact the registration department to sign all necessary forms in order for us  to release information regarding their care.   Consent: Patient/Guardian gives verbal consent for treatment and assignment of benefits for  services provided during this visit. Patient/Guardian expressed understanding and agreed to proceed.    Barnie Gull, MD 01/08/2024, 3:39 PM

## 2024-01-09 ENCOUNTER — Other Ambulatory Visit (HOSPITAL_COMMUNITY): Payer: Self-pay

## 2024-01-09 DIAGNOSIS — E785 Hyperlipidemia, unspecified: Secondary | ICD-10-CM | POA: Diagnosis not present

## 2024-01-09 DIAGNOSIS — R0602 Shortness of breath: Secondary | ICD-10-CM | POA: Diagnosis not present

## 2024-01-09 DIAGNOSIS — R1013 Epigastric pain: Secondary | ICD-10-CM | POA: Diagnosis not present

## 2024-01-09 DIAGNOSIS — I2510 Atherosclerotic heart disease of native coronary artery without angina pectoris: Secondary | ICD-10-CM | POA: Diagnosis not present

## 2024-01-09 DIAGNOSIS — F32A Depression, unspecified: Secondary | ICD-10-CM | POA: Diagnosis not present

## 2024-01-09 DIAGNOSIS — E1151 Type 2 diabetes mellitus with diabetic peripheral angiopathy without gangrene: Secondary | ICD-10-CM | POA: Diagnosis not present

## 2024-01-09 DIAGNOSIS — K5900 Constipation, unspecified: Secondary | ICD-10-CM | POA: Diagnosis not present

## 2024-01-09 DIAGNOSIS — I10 Essential (primary) hypertension: Secondary | ICD-10-CM | POA: Diagnosis not present

## 2024-01-14 DIAGNOSIS — E785 Hyperlipidemia, unspecified: Secondary | ICD-10-CM | POA: Diagnosis not present

## 2024-01-14 DIAGNOSIS — F32A Depression, unspecified: Secondary | ICD-10-CM | POA: Diagnosis not present

## 2024-01-14 DIAGNOSIS — I10 Essential (primary) hypertension: Secondary | ICD-10-CM | POA: Diagnosis not present

## 2024-01-14 DIAGNOSIS — R1013 Epigastric pain: Secondary | ICD-10-CM | POA: Diagnosis not present

## 2024-01-14 DIAGNOSIS — E1151 Type 2 diabetes mellitus with diabetic peripheral angiopathy without gangrene: Secondary | ICD-10-CM | POA: Diagnosis not present

## 2024-01-14 DIAGNOSIS — R0602 Shortness of breath: Secondary | ICD-10-CM | POA: Diagnosis not present

## 2024-01-14 DIAGNOSIS — K5900 Constipation, unspecified: Secondary | ICD-10-CM | POA: Diagnosis not present

## 2024-01-14 DIAGNOSIS — I2510 Atherosclerotic heart disease of native coronary artery without angina pectoris: Secondary | ICD-10-CM | POA: Diagnosis not present

## 2024-01-15 ENCOUNTER — Other Ambulatory Visit (HOSPITAL_COMMUNITY): Payer: Self-pay

## 2024-01-15 ENCOUNTER — Other Ambulatory Visit: Payer: Self-pay

## 2024-01-15 DIAGNOSIS — R0602 Shortness of breath: Secondary | ICD-10-CM | POA: Diagnosis not present

## 2024-01-15 DIAGNOSIS — R1013 Epigastric pain: Secondary | ICD-10-CM | POA: Diagnosis not present

## 2024-01-15 DIAGNOSIS — I2510 Atherosclerotic heart disease of native coronary artery without angina pectoris: Secondary | ICD-10-CM | POA: Diagnosis not present

## 2024-01-15 DIAGNOSIS — I10 Essential (primary) hypertension: Secondary | ICD-10-CM | POA: Diagnosis not present

## 2024-01-15 DIAGNOSIS — E785 Hyperlipidemia, unspecified: Secondary | ICD-10-CM | POA: Diagnosis not present

## 2024-01-15 DIAGNOSIS — F32A Depression, unspecified: Secondary | ICD-10-CM | POA: Diagnosis not present

## 2024-01-15 DIAGNOSIS — E1151 Type 2 diabetes mellitus with diabetic peripheral angiopathy without gangrene: Secondary | ICD-10-CM | POA: Diagnosis not present

## 2024-01-15 DIAGNOSIS — K5900 Constipation, unspecified: Secondary | ICD-10-CM | POA: Diagnosis not present

## 2024-01-16 ENCOUNTER — Other Ambulatory Visit (HOSPITAL_COMMUNITY): Payer: Self-pay

## 2024-01-17 ENCOUNTER — Other Ambulatory Visit (HOSPITAL_COMMUNITY): Payer: Self-pay

## 2024-01-22 ENCOUNTER — Other Ambulatory Visit (HOSPITAL_COMMUNITY): Payer: Self-pay

## 2024-01-22 ENCOUNTER — Other Ambulatory Visit: Payer: Self-pay

## 2024-01-27 ENCOUNTER — Other Ambulatory Visit (HOSPITAL_COMMUNITY): Payer: Self-pay

## 2024-01-29 ENCOUNTER — Other Ambulatory Visit (HOSPITAL_COMMUNITY): Payer: Self-pay

## 2024-01-31 ENCOUNTER — Other Ambulatory Visit (HOSPITAL_COMMUNITY): Payer: Self-pay

## 2024-02-04 ENCOUNTER — Other Ambulatory Visit (HOSPITAL_COMMUNITY): Payer: Self-pay

## 2024-02-05 ENCOUNTER — Other Ambulatory Visit (HOSPITAL_COMMUNITY): Payer: Self-pay | Admitting: Psychiatry

## 2024-02-05 ENCOUNTER — Other Ambulatory Visit (HOSPITAL_COMMUNITY): Payer: Self-pay

## 2024-02-06 ENCOUNTER — Telehealth (INDEPENDENT_AMBULATORY_CARE_PROVIDER_SITE_OTHER): Admitting: Psychiatry

## 2024-02-06 ENCOUNTER — Other Ambulatory Visit (HOSPITAL_COMMUNITY): Payer: Self-pay

## 2024-02-06 ENCOUNTER — Encounter (HOSPITAL_COMMUNITY): Payer: Self-pay | Admitting: Psychiatry

## 2024-02-06 DIAGNOSIS — F419 Anxiety disorder, unspecified: Secondary | ICD-10-CM | POA: Diagnosis not present

## 2024-02-06 DIAGNOSIS — F331 Major depressive disorder, recurrent, moderate: Secondary | ICD-10-CM

## 2024-02-06 DIAGNOSIS — F3160 Bipolar disorder, current episode mixed, unspecified: Secondary | ICD-10-CM

## 2024-02-06 MED ORDER — VENLAFAXINE HCL ER 150 MG PO CP24
150.0000 mg | ORAL_CAPSULE | Freq: Every day | ORAL | 2 refills | Status: DC
  Filled 2024-02-06 – 2024-02-14 (×2): qty 30, 30d supply, fill #0
  Filled 2024-02-26 – 2024-03-12 (×2): qty 30, 30d supply, fill #1
  Filled 2024-04-11: qty 30, 30d supply, fill #2

## 2024-02-06 MED ORDER — HYDROXYZINE PAMOATE 50 MG PO CAPS
50.0000 mg | ORAL_CAPSULE | Freq: Every evening | ORAL | 2 refills | Status: DC
  Filled 2024-02-06: qty 30, 30d supply, fill #0
  Filled 2024-02-26 – 2024-03-11 (×2): qty 30, 30d supply, fill #1
  Filled 2024-04-10: qty 30, 30d supply, fill #2

## 2024-02-06 MED ORDER — PAROXETINE HCL 20 MG PO TABS
20.0000 mg | ORAL_TABLET | Freq: Every day | ORAL | 2 refills | Status: DC
  Filled 2024-02-06 – 2024-04-07 (×3): qty 90, 90d supply, fill #0
  Filled 2024-04-27: qty 90, 90d supply, fill #1

## 2024-02-06 MED ORDER — CLOPIDOGREL BISULFATE 75 MG PO TABS
75.0000 mg | ORAL_TABLET | Freq: Every day | ORAL | 3 refills | Status: DC
  Filled 2024-02-06 – 2024-02-26 (×2): qty 90, 90d supply, fill #0

## 2024-02-06 MED ORDER — QUETIAPINE FUMARATE 100 MG PO TABS
100.0000 mg | ORAL_TABLET | Freq: Two times a day (BID) | ORAL | 2 refills | Status: DC
  Filled 2024-02-06: qty 60, 30d supply, fill #0
  Filled 2024-02-14: qty 158, 79d supply, fill #0
  Filled 2024-02-18: qty 60, 30d supply, fill #0
  Filled 2024-02-26 – 2024-03-19 (×2): qty 60, 30d supply, fill #1
  Filled 2024-04-18: qty 60, 30d supply, fill #2

## 2024-02-06 MED ORDER — DIVALPROEX SODIUM ER 500 MG PO TB24
500.0000 mg | ORAL_TABLET | Freq: Every day | ORAL | 2 refills | Status: DC
  Filled 2024-02-06: qty 90, 90d supply, fill #0
  Filled 2024-02-26 – 2024-04-27 (×2): qty 90, 90d supply, fill #1

## 2024-02-06 MED ORDER — CLOPIDOGREL BISULFATE 75 MG PO TABS
75.0000 mg | ORAL_TABLET | Freq: Every day | ORAL | 3 refills | Status: DC
  Filled 2024-02-06: qty 90, 90d supply, fill #0
  Filled 2024-02-26 – 2024-04-27 (×2): qty 90, 90d supply, fill #1

## 2024-02-06 MED ORDER — LORAZEPAM 1 MG PO TABS
1.0000 mg | ORAL_TABLET | Freq: Three times a day (TID) | ORAL | 2 refills | Status: DC
Start: 2024-02-06 — End: 2024-05-18
  Filled 2024-02-06 – 2024-02-26 (×2): qty 90, 30d supply, fill #0
  Filled 2024-03-26: qty 90, 30d supply, fill #1
  Filled 2024-04-23: qty 90, 30d supply, fill #2

## 2024-02-06 NOTE — Progress Notes (Signed)
 Virtual Visit via Video Note  I connected with Lawrence Bennett on 02/06/24 at  2:00 PM EDT by a video enabled telemedicine application and verified that I am speaking with the correct person using two identifiers.  Location: Patient: home Provider: office   I discussed the limitations of evaluation and management by telemedicine and the availability of in person appointments. The patient expressed understanding and agreed to proceed.     I discussed the assessment and treatment plan with the patient. The patient was provided an opportunity to ask questions and all were answered. The patient agreed with the plan and demonstrated an understanding of the instructions.   The patient was advised to call back or seek an in-person evaluation if the symptoms worsen or if the condition fails to improve as anticipated.  I provided 20 minutes of non-face-to-face time during this encounter.   Lawrence Gull, MD  [facility] MD/PA/NP OP Progress Note  02/06/2024 2:28 PM Lawrence Bennett  MRN:  [id number]  Chief Complaint:  Chief Complaint  Patient presents with   Anxiety   Depression   Follow-up   HPI: This patient is a 68 year old married white male who lives with his wife and 3 children in [place]. 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  He is by himself and it is difficult to understand him because he has hearing loss and I have to repeat myself numerous times.  He states that overall he is doing somewhat better.  He still walking with a walker but gets physical therapy at home.  He has a nurse who comes in to check on him as well.  He asked about why he is not on trazodone .  I explained that several visits ago we switched from trazodone  to hydroxyzine  because he was not sleeping with the trazodone .  With the hydroxyzine  he sleeps okay although he wakes up a few times  to go to the bathroom.  He denies significant depression anxiety thoughts of self-harm.  He states he is no longer crying out at night.  He is not having auditory or visual hallucinations.  He states that his appetite is good and that he is eating well.  When he can hear clearly he makes sense in terms of answers. Visit Diagnosis:    ICD-10-CM   1. Bipolar I disorder, most recent episode mixed (HCC)  F31.60     2. Major depressive disorder, recurrent episode, moderate (HCC)  F33.1     3. Anxiety  F41.9       Past Psychiatric History: Hospitalization in a Geri psych unit in 2015 for severe depression, including ECT  Past Medical History:  Past Medical History:  Diagnosis Date   AAA (abdominal aortic aneurysm) (HCC)    Anxiety    Atherosclerosis of aorta (HCC)    Atrial contractions, premature    Depression    Dyslipidemia    Dyspnea    Elevated glucose 11/11/2015   Hyperlipidemia    Mild hypertension    Stroke [facility])    Urinary hesitancy     Past Surgical History:  Procedure Laterality Date   ABDOMINAL AORTIC ENDOVASCULAR STENT GRAFT Bilateral 08/21/2021   Procedure: ENDOVASCULAR AORTIC STENT GRAFT REPAIR;  Surgeon: Lanis Fonda BRAVO, MD;  Location: [facility] OR;  Service: Vascular;  Laterality: Bilateral;   COLON RESECTION  2012   ULTRASOUND GUIDANCE FOR  VASCULAR ACCESS Bilateral 08/21/2021   Procedure: ULTRASOUND GUIDANCE FOR VASCULAR ACCESS;  Surgeon: Lanis Fonda BRAVO, MD;  Location: [facility] OR;  Service: Vascular;  Laterality: Bilateral;    Family Psychiatric History: See below  Family History:  Family History  Problem Relation Age of Onset   COPD Mother    Stroke Father    Bipolar disorder Father    Hypertension Sister    Depression Sister    Colon cancer Neg Hx    Esophageal cancer Neg Hx    Inflammatory bowel disease Neg Hx    Liver disease Neg Hx    Rectal cancer Neg Hx    Pancreatic cancer Neg Hx    Stomach cancer Neg Hx     Social History:  Social History    Socioeconomic History   Marital status: Married    Spouse name: Not on file   Number of children: 3   Years of education: Not on file   Highest education level: Not on file  Occupational History   Occupation: retired  Tobacco Use   Smoking status: Former    Current packs/day: 0.00    Average packs/day: 0.2 packs/day for 19.0 years (3.8 ttl pk-yrs)    Types: Cigarettes    Start date: 3    Quit date: 2015    Years since quitting: 10.6   Smokeless tobacco: Never  Vaping Use   Vaping status: Never Used  Substance and Sexual Activity   Alcohol use: No    Alcohol/week: 0.0 standard drinks of alcohol   Drug use: No   Sexual activity: Not Currently  Other Topics Concern   Not on file  Social History Narrative   Right handed   Lives with wife   Social Drivers of Health   Financial Resource Strain: Low Risk  (05/22/2023)   Overall Financial Resource Strain (CARDIA)    Difficulty of Paying Living Expenses: Not hard at all  Food Insecurity: No Food Insecurity (11/11/2023)   Hunger Vital Sign    Worried About Running Out of Food in the Last Year: Never true    Ran Out of Food in the Last Year: Never true  Transportation Needs: No Transportation Needs (11/11/2023)   PRAPARE - Administrator, Civil Service (Medical): No    Lack of Transportation (Non-Medical): No  Physical Activity: Insufficiently Active (05/22/2023)   Exercise Vital Sign    Days of Exercise per Week: 7 days    Minutes of Exercise per Session: 20 min  Stress: No Stress Concern Present (05/22/2023)   [employer] of Occupational Health - Occupational Stress Questionnaire    Feeling of Stress : Not at all  Social Connections: Moderately Integrated (11/11/2023)   Social Connection and Isolation Panel    Frequency of Communication with Friends and Family: More than three times a week    Frequency of Social Gatherings with Friends and Family: More than three times a week    Attends Religious  Services: More than 4 times per year    Active Member of Clubs or Organizations: No    Attends Banker Meetings: Never    Marital Status: Married    Allergies: No Known Allergies  Metabolic Disorder Labs: Lab Results  Component Value Date   HGBA1C 6.4 (H) 06/24/2023   No results found for: PROLACTIN Lab Results  Component Value Date   CHOL 149 06/24/2023   TRIG 92 06/24/2023   HDL 47 06/24/2023   CHOLHDL 3.2 06/24/2023   LDLCALC  85 06/24/2023   LDLCALC 73 04/18/2022   Lab Results  Component Value Date   TSH 1.396 11/12/2023   TSH 2.010 06/24/2023    Therapeutic Level Labs: No results found for: LITHIUM Lab Results  Component Value Date   VALPROATE 65.3 11/21/2022   VALPROATE 41.8 (L) 11/29/2016   No results found for: CBMZ  Current Medications: Current Outpatient Medications  Medication Sig Dispense Refill   amLODipine -olmesartan  (AZOR ) 10-40 MG tablet Take 1 tablet by mouth daily. 30 tablet 2   atorvastatin  (LIPITOR ) 80 MG tablet Take 1 tablet (80 mg total) by mouth daily. 90 tablet 3   clopidogrel  (PLAVIX ) 75 MG tablet Take 1 tablet by mouth once daily. 30 tablet 11   clopidogrel  (PLAVIX ) 75 MG tablet Take 1 tablet (75 mg total) by mouth daily. 90 tablet 3   clopidogrel  (PLAVIX ) 75 MG tablet Take 1 tablet (75 mg total) by mouth daily. 90 tablet 3   divalproex  (DEPAKOTE  ER) 500 MG 24 hr tablet Take 1 tablet (500 mg) by mouth at bedtime. 90 tablet 2   hydrOXYzine  (VISTARIL ) 50 MG capsule Take 1 capsule (50 mg total) by mouth at bedtime. 30 capsule 2   LORazepam  (ATIVAN ) 1 MG tablet Take 1 tablet (1 mg total) by mouth 3 (three) times daily. 90 tablet 2   PARoxetine  (PAXIL ) 20 MG tablet Take 1 tablet (20 mg total) by mouth at bedtime. 90 tablet 2   QUEtiapine  (SEROQUEL ) 100 MG tablet Take 1 tablet (100 mg total) by mouth 2 (two) times daily. 60 tablet 2   tamsulosin  (FLOMAX ) 0.4 MG CAPS capsule Take 1 capsule (0.4 mg total) by mouth at bedtime. 30  capsule 0   venlafaxine  XR (EFFEXOR  XR) 150 MG 24 hr capsule Take 1 capsule (150 mg total) by mouth daily with breakfast. 30 capsule 2   No current facility-administered medications for this visit.     Musculoskeletal: Strength & Muscle Tone: decreased Gait & Station: unsteady Patient leans: N/A  Psychiatric Specialty Exam: Review of Systems  HENT:  Positive for hearing loss.   Musculoskeletal:  Positive for gait problem.  Neurological:  Positive for weakness.  All other systems reviewed and are negative.   There were no vitals taken for this visit.There is no height or weight on file to calculate BMI.  General Appearance: Casual and Fairly Groomed  Eye Contact:  Minimal  Speech:  Clear and Coherent  Volume:  Normal  Mood:  Euthymic  Affect:  Congruent  Thought Process:  Goal Directed  Orientation:  Full (Time, Place, and Person)  Thought Content: WDL   Suicidal Thoughts:  No  Homicidal Thoughts:  No  Memory:  Immediate;   Good Recent;   Fair Remote;   NA  Judgement:  Fair  Insight:  Shallow  Psychomotor Activity:  Decreased  Concentration:  Concentration: Fair and Attention Span: Fair  Recall:  [employer] of Knowledge: Fair  Language: Good  Akathisia:  No  Handed:  Right  AIMS (if indicated): not done  Assets:  Communication Skills Desire for Improvement Resilience Social Support  ADL's:  Impaired mild  Cognition: WNL  Sleep:  Fair   Screenings: GAD-7    Garment/textile technologist Visit from 10/31/2022 in [facility] [place] Office Visit from 07/31/2022 in [facility] Visit from 05/15/2022 in [facility] Visit from 10/12/2021 in [facility] Visit from 04/11/2021 in  [facility]  Total GAD-7 Score 0 0 0 0 0   PHQ2-9    Flowsheet Row Clinical Support from 05/22/2023 in [facility] Office Visit from 10/31/2022 in [facility] [place] Office Visit from 07/31/2022 in [facility] Office Visit from 05/15/2022 in [facility] Video Visit from 04/12/2022 in [facility] Total Score 0 0 0 0 0  PHQ-9 Total Score 0 0 0 -- --   Flowsheet Row ED from 11/25/2023 in [facility] ED from 11/18/2023 in [facility] ED to Hosp-Admission (Discharged) from 11/10/2023 in [facility]  C-SSRS RISK CATEGORY No Risk No Risk No Risk     Assessment and Plan: This patient is a 68 year old male with a history of severe depression.  He was hospitalized about 3 months ago after a fall and had severe hyponatremia at that time.  After that time his mental status and weakness worsened but he is slowly getting better.  His mood seems to be stable at this time.  He will continue Paxil  20 mg as well as venlafaxine  150 mg daily for depression, Depakote  ER 500 mg at bedtime for mood stabilization Seroquel  100 mg twice daily for mood stabilization and Ativan  1 mg 3 times daily for anxiety as well as hydroxyzine  50 mg at bedtime for sleep.  He will return to see me in 6 weeks  Collaboration of Care: Collaboration of Care: Primary Care Provider AEB notes are shared with PCP on the epic system  Patient/Guardian was advised Release of Information must be obtained prior to any record release in order to collaborate their care with an outside provider. Patient/Guardian was advised if they have not already done so to contact the registration department to sign all necessary forms in order for us  to release information regarding their care.   Consent: Patient/Guardian gives verbal consent for treatment and assignment of benefits for services provided during this  visit. Patient/Guardian expressed understanding and agreed to proceed.    Lawrence Gull, MD 02/06/2024, 2:28 PM

## 2024-02-07 ENCOUNTER — Other Ambulatory Visit (HOSPITAL_COMMUNITY): Payer: Self-pay

## 2024-02-07 MED ORDER — ATORVASTATIN CALCIUM 80 MG PO TABS
80.0000 mg | ORAL_TABLET | Freq: Every day | ORAL | 3 refills | Status: DC
  Filled 2024-02-26: qty 90, 90d supply, fill #0

## 2024-02-07 MED ORDER — AMLODIPINE-OLMESARTAN 10-40 MG PO TABS
1.0000 | ORAL_TABLET | Freq: Every day | ORAL | 2 refills | Status: DC
  Filled 2024-02-07 – 2024-02-14 (×2): qty 30, 30d supply, fill #0
  Filled 2024-02-26 – 2024-04-27 (×2): qty 30, 30d supply, fill #1

## 2024-02-07 MED ORDER — OZEMPIC (1 MG/DOSE) 4 MG/3ML ~~LOC~~ SOPN
1.0000 mg | PEN_INJECTOR | SUBCUTANEOUS | 2 refills | Status: DC
  Filled 2024-02-07: qty 3, 28d supply, fill #0

## 2024-02-10 ENCOUNTER — Other Ambulatory Visit (HOSPITAL_COMMUNITY): Payer: Self-pay

## 2024-02-11 ENCOUNTER — Other Ambulatory Visit (HOSPITAL_COMMUNITY): Payer: Self-pay

## 2024-02-13 ENCOUNTER — Other Ambulatory Visit (HOSPITAL_COMMUNITY): Payer: Self-pay

## 2024-02-14 ENCOUNTER — Other Ambulatory Visit (HOSPITAL_COMMUNITY): Payer: Self-pay

## 2024-02-14 ENCOUNTER — Other Ambulatory Visit: Payer: Self-pay

## 2024-02-15 ENCOUNTER — Other Ambulatory Visit (HOSPITAL_COMMUNITY): Payer: Self-pay

## 2024-02-17 ENCOUNTER — Other Ambulatory Visit (HOSPITAL_COMMUNITY): Payer: Self-pay

## 2024-02-18 ENCOUNTER — Other Ambulatory Visit (HOSPITAL_COMMUNITY): Payer: Self-pay

## 2024-02-24 ENCOUNTER — Encounter (INDEPENDENT_AMBULATORY_CARE_PROVIDER_SITE_OTHER): Payer: Medicare HMO | Admitting: Ophthalmology

## 2024-02-24 DIAGNOSIS — H348122 Central retinal vein occlusion, left eye, stable: Secondary | ICD-10-CM

## 2024-02-24 DIAGNOSIS — H35033 Hypertensive retinopathy, bilateral: Secondary | ICD-10-CM

## 2024-02-24 DIAGNOSIS — I10 Essential (primary) hypertension: Secondary | ICD-10-CM | POA: Diagnosis not present

## 2024-02-24 DIAGNOSIS — H43813 Vitreous degeneration, bilateral: Secondary | ICD-10-CM | POA: Diagnosis not present

## 2024-02-24 DIAGNOSIS — H35372 Puckering of macula, left eye: Secondary | ICD-10-CM | POA: Diagnosis not present

## 2024-02-25 ENCOUNTER — Other Ambulatory Visit (HOSPITAL_COMMUNITY): Payer: Self-pay

## 2024-02-27 ENCOUNTER — Other Ambulatory Visit (HOSPITAL_COMMUNITY): Payer: Self-pay

## 2024-02-27 ENCOUNTER — Other Ambulatory Visit: Payer: Self-pay

## 2024-03-11 ENCOUNTER — Other Ambulatory Visit (HOSPITAL_COMMUNITY): Payer: Self-pay

## 2024-03-11 ENCOUNTER — Other Ambulatory Visit: Payer: Self-pay

## 2024-03-12 ENCOUNTER — Other Ambulatory Visit: Payer: Self-pay

## 2024-03-12 ENCOUNTER — Other Ambulatory Visit: Payer: Self-pay | Admitting: Family Medicine

## 2024-03-12 ENCOUNTER — Other Ambulatory Visit (HOSPITAL_COMMUNITY): Payer: Self-pay

## 2024-03-13 ENCOUNTER — Encounter (HOSPITAL_COMMUNITY): Payer: Self-pay

## 2024-03-13 ENCOUNTER — Other Ambulatory Visit (HOSPITAL_COMMUNITY): Payer: Self-pay

## 2024-03-13 DIAGNOSIS — Z9889 Other specified postprocedural states: Secondary | ICD-10-CM | POA: Diagnosis not present

## 2024-03-13 DIAGNOSIS — I739 Peripheral vascular disease, unspecified: Secondary | ICD-10-CM | POA: Diagnosis not present

## 2024-03-13 DIAGNOSIS — I10 Essential (primary) hypertension: Secondary | ICD-10-CM | POA: Diagnosis not present

## 2024-03-13 DIAGNOSIS — I6529 Occlusion and stenosis of unspecified carotid artery: Secondary | ICD-10-CM | POA: Diagnosis not present

## 2024-03-13 DIAGNOSIS — R0602 Shortness of breath: Secondary | ICD-10-CM | POA: Diagnosis not present

## 2024-03-13 DIAGNOSIS — Z8679 Personal history of other diseases of the circulatory system: Secondary | ICD-10-CM | POA: Diagnosis not present

## 2024-03-13 DIAGNOSIS — Z95828 Presence of other vascular implants and grafts: Secondary | ICD-10-CM | POA: Diagnosis not present

## 2024-03-13 DIAGNOSIS — I2510 Atherosclerotic heart disease of native coronary artery without angina pectoris: Secondary | ICD-10-CM | POA: Diagnosis not present

## 2024-03-13 MED ORDER — AMLODIPINE-OLMESARTAN 10-40 MG PO TABS
1.0000 | ORAL_TABLET | Freq: Every day | ORAL | 3 refills | Status: AC
  Filled 2024-03-13: qty 90, 90d supply, fill #0
  Filled 2024-06-04: qty 90, 90d supply, fill #1

## 2024-03-13 MED ORDER — CLOPIDOGREL BISULFATE 75 MG PO TABS
75.0000 mg | ORAL_TABLET | Freq: Every day | ORAL | 3 refills | Status: AC
  Filled 2024-03-13 – 2024-07-28 (×2): qty 90, 90d supply, fill #0

## 2024-03-13 MED ORDER — ATORVASTATIN CALCIUM 80 MG PO TABS
80.0000 mg | ORAL_TABLET | Freq: Every day | ORAL | 3 refills | Status: AC
  Filled 2024-03-13 – 2024-07-28 (×2): qty 90, 90d supply, fill #0

## 2024-03-17 ENCOUNTER — Other Ambulatory Visit (HOSPITAL_COMMUNITY): Payer: Self-pay

## 2024-03-19 ENCOUNTER — Telehealth: Payer: Self-pay

## 2024-03-19 NOTE — Telephone Encounter (Signed)
 Made 2 attempts to call pt to schedule 1 year f/u with ultrasounds, but unsuccessful and left voicemail. Also made an attempt to call Nathanel, but was unsuccessful as well. Waiting for return phone call to schedule. MP.

## 2024-03-26 ENCOUNTER — Other Ambulatory Visit (HOSPITAL_COMMUNITY): Payer: Self-pay

## 2024-03-27 ENCOUNTER — Ambulatory Visit

## 2024-03-31 ENCOUNTER — Encounter: Payer: Self-pay | Admitting: Physician Assistant

## 2024-03-31 ENCOUNTER — Ambulatory Visit: Admitting: Physician Assistant

## 2024-03-31 ENCOUNTER — Other Ambulatory Visit: Payer: Self-pay

## 2024-03-31 ENCOUNTER — Other Ambulatory Visit (HOSPITAL_COMMUNITY): Payer: Self-pay

## 2024-03-31 DIAGNOSIS — G8929 Other chronic pain: Secondary | ICD-10-CM

## 2024-03-31 DIAGNOSIS — M545 Low back pain, unspecified: Secondary | ICD-10-CM | POA: Diagnosis not present

## 2024-03-31 MED ORDER — METHYLPREDNISOLONE 4 MG PO TBPK
ORAL_TABLET | ORAL | 0 refills | Status: DC
  Filled 2024-03-31: qty 21, 6d supply, fill #0

## 2024-03-31 NOTE — Progress Notes (Signed)
 Office Visit Note   Patient: Lawrence Bennett           Date of Birth: 02-12-1956           MRN: [id number] Visit Date: 03/31/2024              Requested by: Jolinda Norene HERO, DO [address] [place],  [place] [address] PCP: Jolinda Norene HERO, DO   Assessment & Plan: Visit Diagnoses:  1. Chronic bilateral low back pain without sciatica     Plan: Patient is a pleasant 68 year old gentleman who comes in today with a 15-month history or longer of low back pain.  He said he has had a series of several falls 1 being 3 months ago for which she was hospitalized.  They did not do any studies of his back.  He did not have an orthopedic consult.  He has low back pain that radiates down into his right buttock.  He has had a second fall.  The first fall was not because of balance issues a second fall may have involved some balance.  He was released from the hospital and has had 3 months of physical therapy on his back.  He still considers pain that radiates down his low back into the right buttock.  Denies any loss of bowel or bladder control.  He does have some findings on x-ray cannot rule out a compression injury.  This is a L5 5.  I would like for him since his been 3 months he has tried medication also has tried therapy and has not improved and is have some weakness and feels like he is getting worse.  I have recommended an MRI and follow-up with Duwaine Pouch.  I have given him return precautions.  In the meantime I will prescribe a Medrol  Dosepak.  He understands to only take this and not take any other anti-inflammatories to take it with food and discontinue it if he has any stomach upset  Follow-Up Instructions: With Megan after MRI  Orders:  Orders Placed This Encounter  Procedures   XR Lumbar Spine 2-3 Views   MR Lumbar Spine w/o contrast   Ambulatory referral to Physical Medicine Rehab   Meds ordered this encounter  Medications   methylPREDNISolone  (MEDROL  DOSEPAK) 4 MG TBPK tablet     Sig: Take as directed with food    Dispense:  21 tablet    Refill:  0      Procedures: No procedures performed   Clinical Data: No additional findings.   Subjective: No chief complaint on file.   HPI pleasant 68 year old gentleman comes in today with a chief complaint of low back pain after a mechanical fall 3 months ago.  He is on chronic blood thinners he was placed in the hospital for recovery.  He has been doing home physical therapy for 3 months for his back and he feels like it has not gotten better denies any loss of bowel or bladder control pain radiates down his right buttock  Review of Systems  All other systems reviewed and are negative.    Objective: Vital Signs: There were no vitals taken for this visit.  Physical Exam Pulmonary:     Effort: Pulmonary effort is normal.  Neurological:     General: No focal deficit present.     Mental Status: He is alert and oriented to person, place, and time.  Psychiatric:        Mood and Affect: Mood normal.  Behavior: Behavior normal.     Ortho Exam Examination he has pain with forward flexion but no palpable step-offs or redness.  He has fair strength though less than the left side he has good flexion and dorsiflexion plantarflexion extension flexion of his legs.  Compartments are soft and nontender no evidence of any profound weakness. Specialty Comments:  No specialty comments available.  Imaging: No results found.   PMFS History: Patient Active Problem List   Diagnosis Date Noted   Acute hyponatremia 11/11/2023   Hyponatremia 11/10/2023   Abdominal aortic aneurysm (AAA) greater than 5.5 cm in diameter in male 08/21/2021   Bilateral sensorineural hearing loss 04/03/2021   Pre-diabetes 07/25/2017   History of stroke 03/23/2016   HLD (hyperlipidemia) 12/20/2015   BPH (benign prostatic hyperplasia) 11/11/2015   Major depressive disorder, recurrent episode, moderate (HCC)    Diverticulosis of colon  without hemorrhage 05/02/2014   Essential hypertension 04/15/2014   Anxiety 04/15/2014   Past Medical History:  Diagnosis Date   AAA (abdominal aortic aneurysm)    Anxiety    Atherosclerosis of aorta    Atrial contractions, premature    Depression    Dyslipidemia    Dyspnea    Elevated glucose 11/11/2015   Hyperlipidemia    Mild hypertension    Stroke (HCC)    Urinary hesitancy     Family History  Problem Relation Age of Onset   COPD Mother    Stroke Father    Bipolar disorder Father    Hypertension Sister    Depression Sister    Colon cancer Neg Hx    Esophageal cancer Neg Hx    Inflammatory bowel disease Neg Hx    Liver disease Neg Hx    Rectal cancer Neg Hx    Pancreatic cancer Neg Hx    Stomach cancer Neg Hx     Past Surgical History:  Procedure Laterality Date   ABDOMINAL AORTIC ENDOVASCULAR STENT GRAFT Bilateral 08/21/2021   Procedure: ENDOVASCULAR AORTIC STENT GRAFT REPAIR;  Surgeon: Lanis Fonda BRAVO, MD;  Location: [facility] OR;  Service: Vascular;  Laterality: Bilateral;   COLON RESECTION  2012   ULTRASOUND GUIDANCE FOR VASCULAR ACCESS Bilateral 08/21/2021   Procedure: ULTRASOUND GUIDANCE FOR VASCULAR ACCESS;  Surgeon: Lanis Fonda BRAVO, MD;  Location: [facility] OR;  Service: Vascular;  Laterality: Bilateral;   Social History   Occupational History   Occupation: retired  Tobacco Use   Smoking status: Former    Current packs/day: 0.00    Average packs/day: 0.2 packs/day for 19.0 years (3.8 ttl pk-yrs)    Types: Cigarettes    Start date: 58    Quit date: 2015    Years since quitting: 10.7   Smokeless tobacco: Never  Vaping Use   Vaping status: Never Used  Substance and Sexual Activity   Alcohol use: No    Alcohol/week: 0.0 standard drinks of alcohol   Drug use: No   Sexual activity: Not Currently

## 2024-04-01 ENCOUNTER — Encounter: Payer: Self-pay | Admitting: Physician Assistant

## 2024-04-07 ENCOUNTER — Other Ambulatory Visit (HOSPITAL_COMMUNITY): Payer: Self-pay

## 2024-04-07 ENCOUNTER — Telehealth: Payer: Self-pay | Admitting: Cardiology

## 2024-04-07 NOTE — Telephone Encounter (Signed)
  Patient called back, advised we are calling him to schedule with Dr. Michele he is overdue for yearly f/u. He said he is seeing Dr. Jeronimo now and if he retired he will call back to schedule an appt with us

## 2024-04-08 ENCOUNTER — Ambulatory Visit
Admission: RE | Admit: 2024-04-08 | Discharge: 2024-04-08 | Disposition: A | Discharge status: Home / Self Care | Source: Ambulatory Visit | Attending: Physician Assistant | Admitting: Physician Assistant

## 2024-04-08 DIAGNOSIS — M545 Low back pain, unspecified: Secondary | ICD-10-CM

## 2024-04-08 DIAGNOSIS — S3992XA Unspecified injury of lower back, initial encounter: Secondary | ICD-10-CM | POA: Diagnosis not present

## 2024-04-13 ENCOUNTER — Other Ambulatory Visit: Payer: Self-pay

## 2024-04-13 ENCOUNTER — Telehealth: Payer: Self-pay | Admitting: Physician Assistant

## 2024-04-13 NOTE — Telephone Encounter (Signed)
 Randine from [facility] called MRI Lumbar spine results. Call back number is [phone]

## 2024-04-20 ENCOUNTER — Encounter: Payer: Self-pay | Admitting: Physical Medicine and Rehabilitation

## 2024-04-20 ENCOUNTER — Ambulatory Visit: Admitting: Physical Medicine and Rehabilitation

## 2024-04-20 DIAGNOSIS — R269 Unspecified abnormalities of gait and mobility: Secondary | ICD-10-CM

## 2024-04-20 DIAGNOSIS — M5416 Radiculopathy, lumbar region: Secondary | ICD-10-CM

## 2024-04-20 DIAGNOSIS — M5441 Lumbago with sciatica, right side: Secondary | ICD-10-CM | POA: Diagnosis not present

## 2024-04-20 NOTE — Progress Notes (Unsigned)
 Patient says that he has needed Tylenol  for his low back pain. He says that it does give him temporary relief. He would like to know non-surgical options for treatment. He says that his pain will go down the right leg.

## 2024-04-20 NOTE — Progress Notes (Unsigned)
 Lawrence Bennett - 68 y.o. male MRN [id number]  Date of birth: 13-Jun-1956  Office Visit Note: Visit Date: 04/20/2024 PCP: Jolinda Norene HERO, DO Referred by: Jolinda Norene HERO, DO  Subjective: Chief Complaint  Patient presents with   Lower Back - Pain   HPI: Lawrence Bennett is a 68 y.o. male who comes in today Per the request of Ronal Dragon Persons, PA for evaluation of acute right sided lower back pain radiating to buttock and down right lateral leg. Pain started within the last couple of months following several falls. Lawrence Bennett reports balance issues for several years. His pain worsens with sitting. Lawrence Bennett describes pain as sore and throbbing sensation, currently rates as 8 out of 10. Some relief of pain with home exercise regimen, rest and use of medications. History of in home formal physical therapy for more gait and balance issues. Recent lumbar MRI imaging shows multi level degenerative changes, there are also multi level disc protrusions and facet arthropathy. No high grade spinal canal stenosis. Lawrence Bennett does follow with vascular for known abdominal aortic aneurysm. No history of lumbar surgery/injections. History of multiple falls, Lawrence Bennett is currently using cane to assist with ambulation. No recent trauma or falls.   Patients course is complicated by stroke, anxiety and depression. Lawrence Bennett is currently taking Plavix .      Review of Systems  Musculoskeletal:  Positive for back pain.  Neurological:  Negative for tingling, sensory change, focal weakness and weakness.  All other systems reviewed and are negative.  Otherwise per HPI.  Assessment & Plan: Visit Diagnoses:    ICD-10-CM   1. Acute right-sided low back pain with right-sided sciatica  M54.41 Ambulatory referral to Physical Medicine Rehab    2. Lumbar radiculopathy  M54.16 Ambulatory referral to Physical Medicine Rehab    3. Gait abnormality  R26.9 Ambulatory referral to Physical Medicine Rehab       Plan: Findings:  Acute right sided  lower back pain radiating to buttock and down right lateral leg. Patient continues to have severe pain despite good conservative therapies such as home exercise regimen, rest and use of medications. I discussed recent lumbar MRI with patient today using imaging and spine model. Most of his findings on MRI imaging are left sided which doesn't directly correlate with his pain today. His pain pattern is consistent with L5 distribution. We discussed treatment plan in detail today. Next step is to perform diagnostic and hopefully therapeutic right L5 transforaminal epidural steroid injection under fluoroscopic guidance. If good relief of pain with injection we can repeat this procedure infrequently as needed. I discussed injection procedure in detail, explained to patient that Lawrence Bennett does not need to discontinue home medications prior to injection. Lawrence Bennett inquired about a permanent solution to his pain, I explained to him that Lawrence Bennett could consult with surgeon, however Lawrence Bennett is not interested in surgical intervention at this time. Could also look at referral to chronic pain clinic. I am very hesitant to place him on narcotic pain medications given his history of balance issues and falls. Lawrence Bennett can take prescribed Ativan  about 30 minutes prior to injection. We will see him back for injection. No red flag symptoms noted upon exam today.      Meds & Orders: No orders of the defined types were placed in this encounter.   Orders Placed This Encounter  Procedures   Ambulatory referral to Physical Medicine Rehab    Follow-up: Return for Right L5 transforaminal epidural steroid injection.   Procedures: No  procedures performed      Clinical History: EXAM: MRI LUMBAR SPINE 04/08/2024 06:15:25 PM   TECHNIQUE: Multiplanar multisequence MRI of the lumbar spine was performed without the administration of intravenous contrast.   COMPARISON: Lumbar spine radiographs 03/31/2024.   CLINICAL HISTORY: Low back pain, trauma. Low  back pain, trauma; Chronic bilateral low back pain without sciatica.   FINDINGS:   BONES AND ALIGNMENT: Normal alignment. Slight degenerative anterolisthesis is present at L3-4. Normal vertebral body heights. Bone marrow signal is unremarkable. Chronic type 2 Modic changes are present at L5-S1. Inferior endplate Schmorl's nodes are present at L2, L3, and L4.   SPINAL CORD: The conus terminates normally.   SOFT TISSUES: No paraspinal mass. A 12 mm simple cyst is present at the upper pole of the right kidney. An abdominal aortic aneurysm anterior to L3 measures 51 mm in transverse diameter, just above the bifurcation.   L1-L2: Mild disc bulging and facet hypertrophy is present at L1-2 without focal stenosis.   L2-L3: A broad-based disc protrusion was present at L2-3. Mild left subarticular and bilateral foraminal stenosis is present.   L3-L4: Broad-based disc protrusion and moderate bilateral facet hypertrophy is present at L3-4 with mild left subarticular and moderate bilateral foraminal stenosis.   L4-L5: Disc protrusion and asymmetric left facet hypertrophy is present at L4-5. Moderate left and mild right foraminal stenosis is present.   L5-S1: Chronic loss of disc height is present at L5-S1 without focal disc protrusion or stenosis.   IMPRESSION: 1. Broad-based disc protrusion at L2-3 with mild left subarticular and bilateral foraminal stenosis. 2. Broad-based disc protrusion and moderate bilateral facet hypertrophy at L3-4 with mild left subarticular and moderate bilateral foraminal stenosis. 3. Disc protrusion and asymmetric left facet hypertrophy at L4-5 with moderate left and mild right foraminal stenosis. 4. Abdominal aortic aneurysm measuring 51 mm just above the bifurcation. Recommend vascular surgical consultation and repeat imaging in 6 months.   Electronically signed by: Lonni Necessary MD 04/12/2024 01:00 PM EDT RP Workstation: [id number]   Lawrence Bennett  reports that Lawrence Bennett quit smoking about 10 years ago. His smoking use included cigarettes. Lawrence Bennett started smoking about 29 years ago. Lawrence Bennett has a 3.8 pack-year smoking history. Lawrence Bennett has never used smokeless tobacco.  Recent Labs    06/24/23 1349  HGBA1C 6.4*    Objective:  VS:  HT:    WT:   BMI:     BP:   HR: bpm  TEMP: ( )  RESP:  Physical Exam Vitals and nursing note reviewed.  HENT:     Head: Normocephalic and atraumatic.     Right Ear: External ear normal.     Left Ear: External ear normal.     Nose: Nose normal.     Mouth/Throat:     Mouth: Mucous membranes are moist.  Eyes:     Extraocular Movements: Extraocular movements intact.  Cardiovascular:     Rate and Rhythm: Normal rate.     Pulses: Normal pulses.  Pulmonary:     Effort: Pulmonary effort is normal.  Abdominal:     General: Abdomen is flat. There is no distension.  Musculoskeletal:        General: Tenderness present.     Cervical back: Normal range of motion.     Comments: Patient is slow to rise from seated position to standing. Good lumbar range of motion. No pain noted with facet loading. 5/5 strength noted with bilateral hip flexion, knee flexion/extension, ankle dorsiflexion/plantarflexion and EHL. No clonus noted  bilaterally. No pain upon palpation of greater trochanters. No pain with internal/external rotation of bilateral hips. Sensation intact bilaterally. Dysesthesias noted to right L5 dermatome. Negative slump test bilaterally. Ambulates with cane, gait slow and unsteady.   Skin:    General: Skin is warm and dry.     Capillary Refill: Capillary refill takes less than 2 seconds.  Neurological:     Mental Status: Lawrence Bennett is alert and oriented to person, place, and time.     Gait: Gait abnormal.  Psychiatric:        Mood and Affect: Mood normal.        Behavior: Behavior normal.     Ortho Exam  Imaging: No results found.  Past Medical/Family/Surgical/Social History: Medications & Allergies reviewed per EMR, new  medications updated. Patient Active Problem List   Diagnosis Date Noted   Acute hyponatremia 11/11/2023   Hyponatremia 11/10/2023   Abdominal aortic aneurysm (AAA) greater than 5.5 cm in diameter in male 08/21/2021   Bilateral sensorineural hearing loss 04/03/2021   Pre-diabetes 07/25/2017   History of stroke 03/23/2016   HLD (hyperlipidemia) 12/20/2015   BPH (benign prostatic hyperplasia) 11/11/2015   Major depressive disorder, recurrent episode, moderate (HCC)    Diverticulosis of colon without hemorrhage 05/02/2014   Essential hypertension 04/15/2014   Anxiety 04/15/2014   Past Medical History:  Diagnosis Date   AAA (abdominal aortic aneurysm)    Anxiety    Atherosclerosis of aorta    Atrial contractions, premature    Depression    Dyslipidemia    Dyspnea    Elevated glucose 11/11/2015   Hyperlipidemia    Mild hypertension    Stroke (HCC)    Urinary hesitancy    Family History  Problem Relation Age of Onset   COPD Mother    Stroke Father    Bipolar disorder Father    Hypertension Sister    Depression Sister    Colon cancer Neg Hx    Esophageal cancer Neg Hx    Inflammatory bowel disease Neg Hx    Liver disease Neg Hx    Rectal cancer Neg Hx    Pancreatic cancer Neg Hx    Stomach cancer Neg Hx    Past Surgical History:  Procedure Laterality Date   ABDOMINAL AORTIC ENDOVASCULAR STENT GRAFT Bilateral 08/21/2021   Procedure: ENDOVASCULAR AORTIC STENT GRAFT REPAIR;  Surgeon: Lanis Fonda BRAVO, MD;  Location: [facility] OR;  Service: Vascular;  Laterality: Bilateral;   COLON RESECTION  2012   ULTRASOUND GUIDANCE FOR VASCULAR ACCESS Bilateral 08/21/2021   Procedure: ULTRASOUND GUIDANCE FOR VASCULAR ACCESS;  Surgeon: Lanis Fonda BRAVO, MD;  Location: [facility] OR;  Service: Vascular;  Laterality: Bilateral;   Social History   Occupational History   Occupation: retired  Tobacco Use   Smoking status: Former    Current packs/day: 0.00    Average packs/day: 0.2 packs/day for 19.0  years (3.8 ttl pk-yrs)    Types: Cigarettes    Start date: 18    Quit date: 2015    Years since quitting: 10.8   Smokeless tobacco: Never  Vaping Use   Vaping status: Never Used  Substance and Sexual Activity   Alcohol use: No    Alcohol/week: 0.0 standard drinks of alcohol   Drug use: No   Sexual activity: Not Currently

## 2024-04-22 ENCOUNTER — Other Ambulatory Visit (HOSPITAL_COMMUNITY): Payer: Self-pay

## 2024-04-23 ENCOUNTER — Other Ambulatory Visit: Payer: Self-pay

## 2024-04-23 DIAGNOSIS — I739 Peripheral vascular disease, unspecified: Secondary | ICD-10-CM

## 2024-04-23 DIAGNOSIS — I714 Abdominal aortic aneurysm, without rupture, unspecified: Secondary | ICD-10-CM

## 2024-04-24 ENCOUNTER — Telehealth: Payer: Self-pay | Admitting: Physical Medicine and Rehabilitation

## 2024-04-24 NOTE — Telephone Encounter (Signed)
 Pt returned called back stating he received 2 calls from us  and upon looking in his chart I see that he was referred to Dr. Eldonna. Pt call back number is [phone]

## 2024-04-27 ENCOUNTER — Other Ambulatory Visit (HOSPITAL_COMMUNITY): Payer: Self-pay

## 2024-04-27 NOTE — Telephone Encounter (Signed)
LM for patient to schedule injection

## 2024-04-28 ENCOUNTER — Other Ambulatory Visit (HOSPITAL_COMMUNITY): Payer: Self-pay

## 2024-05-04 ENCOUNTER — Encounter: Payer: Self-pay | Admitting: Radiology

## 2024-05-07 ENCOUNTER — Other Ambulatory Visit (HOSPITAL_COMMUNITY): Payer: Self-pay

## 2024-05-07 ENCOUNTER — Other Ambulatory Visit (HOSPITAL_COMMUNITY): Payer: Self-pay | Admitting: Psychiatry

## 2024-05-08 ENCOUNTER — Other Ambulatory Visit (HOSPITAL_COMMUNITY): Payer: Self-pay

## 2024-05-08 MED ORDER — HYDROXYZINE PAMOATE 50 MG PO CAPS
50.0000 mg | ORAL_CAPSULE | Freq: Every evening | ORAL | 2 refills | Status: AC
  Filled 2024-05-08 – 2024-05-09 (×2): qty 30, 30d supply, fill #0
  Filled 2024-06-08: qty 30, 30d supply, fill #1
  Filled 2024-07-08: qty 30, 30d supply, fill #2

## 2024-05-08 MED ORDER — VENLAFAXINE HCL ER 150 MG PO CP24
150.0000 mg | ORAL_CAPSULE | Freq: Every day | ORAL | 2 refills | Status: DC
  Filled 2024-05-08 – 2024-05-09 (×2): qty 30, 30d supply, fill #0

## 2024-05-08 NOTE — Telephone Encounter (Signed)
 Call for appt

## 2024-05-09 ENCOUNTER — Other Ambulatory Visit (HOSPITAL_COMMUNITY): Payer: Self-pay | Admitting: Psychiatry

## 2024-05-09 ENCOUNTER — Other Ambulatory Visit (HOSPITAL_COMMUNITY): Payer: Self-pay

## 2024-05-11 ENCOUNTER — Other Ambulatory Visit (HOSPITAL_COMMUNITY): Payer: Self-pay

## 2024-05-11 ENCOUNTER — Other Ambulatory Visit: Payer: Self-pay

## 2024-05-11 MED ORDER — QUETIAPINE FUMARATE 100 MG PO TABS
100.0000 mg | ORAL_TABLET | Freq: Two times a day (BID) | ORAL | 2 refills | Status: DC
  Filled 2024-05-11 – 2024-05-12 (×2): qty 60, 30d supply, fill #0

## 2024-05-11 NOTE — Telephone Encounter (Signed)
 Call for appt

## 2024-05-12 ENCOUNTER — Other Ambulatory Visit (HOSPITAL_COMMUNITY): Payer: Self-pay

## 2024-05-13 ENCOUNTER — Other Ambulatory Visit (HOSPITAL_COMMUNITY): Payer: Self-pay

## 2024-05-13 ENCOUNTER — Ambulatory Visit: Payer: Self-pay | Admitting: Family Medicine

## 2024-05-13 ENCOUNTER — Encounter: Payer: Self-pay | Admitting: Family Medicine

## 2024-05-13 VITALS — BP 138/81 | HR 84 | Temp 97.1°F | Ht 71.0 in | Wt 256.5 lb

## 2024-05-13 DIAGNOSIS — M5412 Radiculopathy, cervical region: Secondary | ICD-10-CM | POA: Diagnosis not present

## 2024-05-13 DIAGNOSIS — R3912 Poor urinary stream: Secondary | ICD-10-CM | POA: Diagnosis not present

## 2024-05-13 DIAGNOSIS — M51362 Other intervertebral disc degeneration, lumbar region with discogenic back pain and lower extremity pain: Secondary | ICD-10-CM

## 2024-05-13 DIAGNOSIS — N401 Enlarged prostate with lower urinary tract symptoms: Secondary | ICD-10-CM | POA: Diagnosis not present

## 2024-05-13 MED ORDER — GABAPENTIN 300 MG PO CAPS
ORAL_CAPSULE | ORAL | 0 refills | Status: DC
  Filled 2024-05-13: qty 90, 45d supply, fill #0

## 2024-05-13 NOTE — Progress Notes (Signed)
 Subjective: CC: needs referral to urologist PCP: Jolinda Norene HERO, DO YEP:Lawrence Bennett is a 68 y.o. male who is accompanied today's visit by his grandson.  He is presenting to clinic today for:  BPH Reports that he has had ongoing weak urinary stream despite use of Flomax .  Feels like it's time to see a urologist.  Last PSA normal at 2.1.  He denies any nocturia.  He does have urinary frequency during the day if he drinks the water  that he supposed to drink throughout the day.  He denies any hematuria or pelvic pain.  Has chronic low back pain but has degenerative disc disease with associated osteoarthritis and will be having an epidural injection on 12 December.  Today he actually reports some intermittent numbness and tingling that are in bilateral arms.  He does not report any weakness but wants to know if this might be related to his lumbar issues.  Not taking any medications but wants to know if there is any medication he might take to assist with this.  Currently ambulating with use of cane   ROS: Per HPI  No Known Allergies Past Medical History:  Diagnosis Date   AAA (abdominal aortic aneurysm)    Anxiety    Atherosclerosis of aorta    Atrial contractions, premature    Depression    Dyslipidemia    Dyspnea    Elevated glucose 11/11/2015   Hyperlipidemia    Mild hypertension    Stroke [facility])    Urinary hesitancy     Current Outpatient Medications:    amLODipine -olmesartan  (AZOR ) 10-40 MG tablet, Take 1 tablet by mouth daily., Disp: 90 tablet, Rfl: 3   atorvastatin  (LIPITOR ) 80 MG tablet, Take 1 tablet (80 mg total) by mouth daily., Disp: 90 tablet, Rfl: 3   clopidogrel  (PLAVIX ) 75 MG tablet, Take 1 tablet (75 mg total) by mouth daily., Disp: 90 tablet, Rfl: 3   divalproex  (DEPAKOTE  ER) 500 MG 24 hr tablet, Take 1 tablet (500 mg) by mouth at bedtime., Disp: 90 tablet, Rfl: 2   gabapentin (NEURONTIN) 300 MG capsule, Take 1 capsule at bedtime for 7 days.  You may then  increase to 1 capsule twice daily if needed., Disp: 90 capsule, Rfl: 0   hydrOXYzine  (VISTARIL ) 50 MG capsule, Take 1 capsule (50 mg total) by mouth at bedtime., Disp: 30 capsule, Rfl: 2   LORazepam  (ATIVAN ) 1 MG tablet, Take 1 tablet (1 mg total) by mouth 3 (three) times daily., Disp: 90 tablet, Rfl: 2   PARoxetine  (PAXIL ) 20 MG tablet, Take 1 tablet (20 mg total) by mouth at bedtime., Disp: 90 tablet, Rfl: 2   QUEtiapine  (SEROQUEL ) 100 MG tablet, Take 1 tablet (100 mg total) by mouth 2 (two) times daily., Disp: 60 tablet, Rfl: 2   tamsulosin  (FLOMAX ) 0.4 MG CAPS capsule, Take 1 capsule (0.4 mg total) by mouth at bedtime., Disp: 30 capsule, Rfl: 0   venlafaxine  XR (EFFEXOR  XR) 150 MG 24 hr capsule, Take 1 capsule (150 mg total) by mouth daily with breakfast., Disp: 30 capsule, Rfl: 2 Social History   Socioeconomic History   Marital status: Married    Spouse name: Not on file   Number of children: 3   Years of education: Not on file   Highest education level: Not on file  Occupational History   Occupation: retired  Tobacco Use   Smoking status: Former    Current packs/day: 0.00    Average packs/day: 0.2 packs/day for 19.0 years (  3.8 ttl pk-yrs)    Types: Cigarettes    Start date: 18    Quit date: 2015    Years since quitting: 10.8   Smokeless tobacco: Never  Vaping Use   Vaping status: Never Used  Substance and Sexual Activity   Alcohol use: No    Alcohol/week: 0.0 standard drinks of alcohol   Drug use: No   Sexual activity: Not Currently  Other Topics Concern   Not on file  Social History Narrative   Right handed   Lives with wife   Social Drivers of Health   Financial Resource Strain: Low Risk  (05/22/2023)   Overall Financial Resource Strain (CARDIA)    Difficulty of Paying Living Expenses: Not hard at all  Food Insecurity: No Food Insecurity (11/11/2023)   Hunger Vital Sign    Worried About Running Out of Food in the Last Year: Never true    Ran Out of Food in the  Last Year: Never true  Transportation Needs: No Transportation Needs (11/11/2023)   PRAPARE - Administrator, Civil Service (Medical): No    Lack of Transportation (Non-Medical): No  Physical Activity: Insufficiently Active (05/22/2023)   Exercise Vital Sign    Days of Exercise per Week: 7 days    Minutes of Exercise per Session: 20 min  Stress: No Stress Concern Present (05/22/2023)   [employer] of Occupational Health - Occupational Stress Questionnaire    Feeling of Stress : Not at all  Social Connections: Moderately Integrated (11/11/2023)   Social Connection and Isolation Panel    Frequency of Communication with Friends and Family: More than three times a week    Frequency of Social Gatherings with Friends and Family: More than three times a week    Attends Religious Services: More than 4 times per year    Active Member of [employer] or Organizations: No    Attends Banker Meetings: Never    Marital Status: Married  Catering Manager Violence: Not At Risk (11/11/2023)   Humiliation, Afraid, Rape, and Kick questionnaire    Fear of Current or Ex-Partner: No    Emotionally Abused: No    Physically Abused: No    Sexually Abused: No   Family History  Problem Relation Age of Onset   COPD Mother    Stroke Father    Bipolar disorder Father    Hypertension Sister    Depression Sister    Colon cancer Neg Hx    Esophageal cancer Neg Hx    Inflammatory bowel disease Neg Hx    Liver disease Neg Hx    Rectal cancer Neg Hx    Pancreatic cancer Neg Hx    Stomach cancer Neg Hx     Objective: Office vital signs reviewed. BP 138/81   Pulse 84   Temp (!) 97.1 F (36.2 C)   Ht 5' 11 (1.803 m)   Wt 256 lb 8 oz (116.3 kg)   SpO2 94%   BMI 35.77 kg/m   Physical Examination:  General: Awake, alert, obese nourished, No acute distress GU: no suprapubic TTP, no CVA TTP MSK: ambulates with use of cane.  Has fairly preserved ROM of UEs. Neuro: very difficult of  hearing  Assessment/ Plan: 68 y.o. male   Benign prostatic hyperplasia with weak urinary stream - Plan: Ambulatory referral to Urology  Cervical radiculopathy - Plan: gabapentin (NEURONTIN) 300 MG capsule  Degeneration of intervertebral disc of lumbar region with discogenic back pain and lower extremity pain  Referral to urology in [place] per his request.  We discussed potential for bladder issues versus a prostate issues.  Will trial him on gabapentin for cervical radiculopathy but I encouraged him to discuss this further with his spinal surgeon as well as he may be a candidate for injection into the C-spine.  We discussed the sedating nature and possible side effects of gabapentin.  Advised to use it with extreme caution.  We will reassess him again at his physical at the end of December, sooner if concerns arise   Norene CHRISTELLA Fielding, DO [facility] [phone]

## 2024-05-13 NOTE — Patient Instructions (Addendum)
 Fasting labs will be collected at that visit.  Cervical Radiculopathy  Cervical radiculopathy means that a nerve in the neck (a cervical nerve) is pinched or bruised. This can happen because of an injury to the cervical spine (vertebrae) in the neck, or as a normal part of getting older. This condition can cause pain or loss of feeling (numbness) that runs from your neck all the way down to your arm and fingers. Often, this condition gets better with rest. Treatment may be needed if the condition does not get better. What are the causes? A neck injury. A bulging disk in your spine. Sudden muscle tightening (muscle spasms). Tight muscles in your neck due to overuse. Arthritis. Breakdown in the bones and joints of the spine (spondylosis) due to getting older. Bone spurs that form near the nerves in the neck. What are the signs or symptoms? Pain. The pain may: Run from the neck to the arm and hand. Be very bad or irritating. Get worse when you move your neck. Loss of feeling or tingling in your arm or hand. Weakness in your arm or hand, in very bad cases. How is this treated? In many cases, treatment is not needed for this condition. With rest, the condition often gets better over time. If treatment is needed, options may include: Wearing a soft neck collar (cervical collar) for short periods of time. Doing exercises (physical therapy) to strengthen your neck muscles. Taking medicines. Having shots (injections) in your spine, in very bad cases. Having surgery. This may be needed if other treatments do not help. The type of surgery that is used will depend on the cause of your condition. Follow these instructions at home: If you have a soft neck collar: Wear it as told by your doctor. Take it off only as told by your doctor. Ask your doctor if you can take the collar off for cleaning and bathing. If you are allowed to take the collar off for cleaning or bathing: Follow instructions from  your doctor about how to take off the collar safely. Clean the collar by wiping it with mild soap and water  and drying it completely. Take out any removable pads in the collar every 1-2 days. Wash them by hand with soap and water . Let them air-dry completely before you put them back in the collar. Check your skin under the collar for redness or sores. If you see any, tell your doctor. Managing pain     Take over-the-counter and prescription medicines only as told by your doctor. If told, put ice on the painful area. To do this: If you have a soft neck collar, take if off as told by your doctor. Put ice in a plastic bag. Place a towel between your skin and the bag. Leave the ice on for 20 minutes, 2-3 times a day. Take off the ice if your skin turns bright red. This is very important. If you cannot feel pain, heat, or cold, you have a greater risk of damage to the area. If using ice does not help, you can try using heat. Use the heat source that your doctor recommends, such as a moist heat pack or a heating pad. Place a towel between your skin and the heat source. Leave the heat on for 20-30 minutes. Take off the heat if your skin turns bright red. This is very important. If you cannot feel pain, heat, or cold, you have a greater risk of getting burned. You may try a gentle neck  and shoulder rub (massage). Activity Rest as needed. Return to your normal activities when your doctor says that it is safe. Do exercises as told by your doctor or physical therapist. You may have to avoid lifting. Ask your doctor how much you can safely lift. General instructions Use a flat pillow when you sleep. Do not drive while wearing a soft neck collar. If you do not have a soft neck collar, ask your doctor if it is safe to drive while your neck heals. Ask your doctor if you should avoid driving or using machines while you are taking your medicine. Do not smoke or use any products that contain nicotine or  tobacco. If you need help quitting, ask your doctor. Keep all follow-up visits. Contact a doctor if: Your condition does not get better with treatment. Get help right away if: Your pain gets worse and medicine does not help. You lose feeling or feel weak in your hand, arm, face, or leg. You have a high fever. Your neck is stiff. You cannot control when you poop or pee (have incontinence). You have trouble with walking, balance, or talking. Summary Cervical radiculopathy means that a nerve in the neck is pinched or bruised. A nerve can get pinched from a bulging disk, arthritis, an injury to the neck, or other causes. Symptoms include pain, tingling, or loss of feeling that goes from the neck to the arm or hand. Weakness in your arm or hand can happen in very bad cases. Treatment may include resting, wearing a soft neck collar, and doing exercises. You might need to take medicines for pain. In very bad cases, shots or surgery may be needed. This information is not intended to replace advice given to you by your health care provider. Make sure you discuss any questions you have with your health care provider. Document Revised: 12/22/2020 Document Reviewed: 12/22/2020 Elsevier Patient Education  2024 [employer].

## 2024-05-18 ENCOUNTER — Encounter (HOSPITAL_COMMUNITY): Payer: Self-pay | Admitting: Psychiatry

## 2024-05-18 ENCOUNTER — Other Ambulatory Visit (HOSPITAL_COMMUNITY): Payer: Self-pay

## 2024-05-18 ENCOUNTER — Telehealth (INDEPENDENT_AMBULATORY_CARE_PROVIDER_SITE_OTHER): Admitting: Psychiatry

## 2024-05-18 DIAGNOSIS — F419 Anxiety disorder, unspecified: Secondary | ICD-10-CM

## 2024-05-18 DIAGNOSIS — F331 Major depressive disorder, recurrent, moderate: Secondary | ICD-10-CM

## 2024-05-18 DIAGNOSIS — F3160 Bipolar disorder, current episode mixed, unspecified: Secondary | ICD-10-CM

## 2024-05-18 MED ORDER — DIVALPROEX SODIUM ER 500 MG PO TB24
500.0000 mg | ORAL_TABLET | Freq: Every day | ORAL | 2 refills | Status: AC
  Filled 2024-05-18: qty 90, 90d supply, fill #0

## 2024-05-18 MED ORDER — VENLAFAXINE HCL ER 150 MG PO CP24
150.0000 mg | ORAL_CAPSULE | Freq: Every day | ORAL | 2 refills | Status: AC
  Filled 2024-05-18 – 2024-06-08 (×2): qty 30, 30d supply, fill #0
  Filled 2024-07-08: qty 30, 30d supply, fill #1
  Filled 2024-08-07: qty 30, 30d supply, fill #2

## 2024-05-18 MED ORDER — HYDROXYZINE PAMOATE 25 MG PO CAPS
25.0000 mg | ORAL_CAPSULE | Freq: Every evening | ORAL | 2 refills | Status: AC
  Filled 2024-05-18: qty 30, 30d supply, fill #0
  Filled 2024-06-18: qty 30, 30d supply, fill #1
  Filled 2024-07-18: qty 30, 30d supply, fill #2

## 2024-05-18 MED ORDER — PAROXETINE HCL 20 MG PO TABS
20.0000 mg | ORAL_TABLET | Freq: Every day | ORAL | 2 refills | Status: AC
  Filled 2024-05-18 – 2024-06-23 (×3): qty 90, 90d supply, fill #0

## 2024-05-18 MED ORDER — QUETIAPINE FUMARATE 50 MG PO TABS
50.0000 mg | ORAL_TABLET | Freq: Every day | ORAL | 2 refills | Status: AC
  Filled 2024-05-18: qty 30, 30d supply, fill #0
  Filled 2024-06-18: qty 30, 30d supply, fill #1
  Filled 2024-07-18: qty 30, 30d supply, fill #2

## 2024-05-18 MED ORDER — LORAZEPAM 1 MG PO TABS
1.0000 mg | ORAL_TABLET | Freq: Three times a day (TID) | ORAL | 2 refills | Status: AC
  Filled 2024-05-18 – 2024-05-21 (×2): qty 90, 30d supply, fill #0
  Filled 2024-06-17: qty 90, 30d supply, fill #1
  Filled 2024-07-17: qty 90, 30d supply, fill #2
  Filled ????-??-??: fill #1

## 2024-05-18 NOTE — Progress Notes (Signed)
 Virtual Visit via Video Note  I connected with Lawrence Bennett on 05/18/24 at  2:00 PM EST by a video enabled telemedicine application and verified that I am speaking with the correct person using two identifiers.  Location: Patient: home Provider: office   I discussed the limitations of evaluation and management by telemedicine and the availability of in person appointments. The patient expressed understanding and agreed to proceed.     I discussed the assessment and treatment plan with the patient. The patient was provided an opportunity to ask questions and all were answered. The patient agreed with the plan and demonstrated an understanding of the instructions.   The patient was advised to call back or seek an in-person evaluation if the symptoms worsen or if the condition fails to improve as anticipated.  I provided 20 minutes of non-face-to-face time during this encounter.   Barnie Gull, MD  [facility] MD/PA/NP OP Progress Note  05/18/2024 2:31 PM Lawrence Bennett  MRN:  [id number]  Chief Complaint:  Chief Complaint  Patient presents with   Anxiety   Depression   Hallucinations   Follow-up   HPI: This patient is a 68 year old married white male who lives with his wife and 3 children in [place]. 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  He states he is doing much better compared to his last visit.  At that time he needed physical therapy but now he is walking on his own.  He denies depression or anxiety and states that he is sleeping well.  He has not been crying out or having any delusions like he had last summer.  He states he is over sedated when he wakes up and he does not think it would be safe for him to drive.  He asked if we could cut down the Seroquel  to what he was on before which was 50 mg at  bedtime.  Right now he is on 100 mg twice a day we can also try cutting down the hydroxyzine .  He asked about the Effexor  and Ativan  but I do not want to change too many things in 1 day.  He is okay with disc cutting back these first 2 medications.  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 Visit Diagnosis:    ICD-10-CM   1. Bipolar I disorder, most recent episode mixed (HCC)  F31.60     2. Major depressive disorder, recurrent episode, moderate (HCC)  F33.1     3. Anxiety  F41.9       Past Psychiatric History: Hospitalization in the Geri psych unit in 2015 requiring ECT  Past Medical History:  Past Medical History:  Diagnosis Date   AAA (abdominal aortic aneurysm)    Anxiety    Atherosclerosis of aorta    Atrial contractions, premature    Depression    Dyslipidemia    Dyspnea    Elevated glucose 11/11/2015   Hyperlipidemia    Mild hypertension    Stroke [facility])    Urinary hesitancy     Past Surgical History:  Procedure Laterality Date   ABDOMINAL AORTIC ENDOVASCULAR STENT GRAFT Bilateral 08/21/2021   Procedure: ENDOVASCULAR AORTIC STENT GRAFT REPAIR;  Surgeon: Lanis Fonda BRAVO, MD;  Location: [facility] OR;  Service: Vascular;  Laterality: Bilateral;   COLON  RESECTION  2012   ULTRASOUND GUIDANCE FOR VASCULAR ACCESS Bilateral 08/21/2021   Procedure: ULTRASOUND GUIDANCE FOR VASCULAR ACCESS;  Surgeon: Lanis Fonda BRAVO, MD;  Location: [facility] OR;  Service: Vascular;  Laterality: Bilateral;    Family Psychiatric History: See below  Family History:  Family History  Problem Relation Age of Onset   COPD Mother    Stroke Father    Bipolar disorder Father    Hypertension Sister    Depression Sister    Colon cancer Neg Hx    Esophageal cancer Neg Hx    Inflammatory bowel disease Neg Hx    Liver disease Neg Hx    Rectal cancer Neg Hx    Pancreatic cancer Neg Hx    Stomach cancer Neg Hx     Social History:  Social History   Socioeconomic History    Marital status: Married    Spouse name: Not on file   Number of children: 3   Years of education: Not on file   Highest education level: Not on file  Occupational History   Occupation: retired  Tobacco Use   Smoking status: Former    Current packs/day: 0.00    Average packs/day: 0.2 packs/day for 19.0 years (3.8 ttl pk-yrs)    Types: Cigarettes    Start date: 44    Quit date: 2015    Years since quitting: 10.8   Smokeless tobacco: Never  Vaping Use   Vaping status: Never Used  Substance and Sexual Activity   Alcohol use: No    Alcohol/week: 0.0 standard drinks of alcohol   Drug use: No   Sexual activity: Not Currently  Other Topics Concern   Not on file  Social History Narrative   Right handed   Lives with wife   Social Drivers of Health   Financial Resource Strain: Low Risk  (05/22/2023)   Overall Financial Resource Strain (CARDIA)    Difficulty of Paying Living Expenses: Not hard at all  Food Insecurity: No Food Insecurity (11/11/2023)   Hunger Vital Sign    Worried About Running Out of Food in the Last Year: Never true    Ran Out of Food in the Last Year: Never true  Transportation Needs: No Transportation Needs (11/11/2023)   PRAPARE - Administrator, Civil Service (Medical): No    Lack of Transportation (Non-Medical): No  Physical Activity: Insufficiently Active (05/22/2023)   Exercise Vital Sign    Days of Exercise per Week: 7 days    Minutes of Exercise per Session: 20 min  Stress: No Stress Concern Present (05/22/2023)   [employer] of Occupational Health - Occupational Stress Questionnaire    Feeling of Stress : Not at all  Social Connections: Moderately Integrated (11/11/2023)   Social Connection and Isolation Panel    Frequency of Communication with Friends and Family: More than three times a week    Frequency of Social Gatherings with Friends and Family: More than three times a week    Attends Religious Services: More than 4 times  per year    Active Member of Clubs or Organizations: No    Attends Banker Meetings: Never    Marital Status: Married    Allergies: No Known Allergies  Metabolic Disorder Labs: Lab Results  Component Value Date   HGBA1C 6.4 (H) 06/24/2023   No results found for: PROLACTIN Lab Results  Component Value Date   CHOL 149 06/24/2023   TRIG 92 06/24/2023   HDL 47 06/24/2023  CHOLHDL 3.2 06/24/2023   LDLCALC 85 06/24/2023   LDLCALC 73 04/18/2022   Lab Results  Component Value Date   TSH 1.396 11/12/2023   TSH 2.010 06/24/2023    Therapeutic Level Labs: No results found for: LITHIUM Lab Results  Component Value Date   VALPROATE 65.3 11/21/2022   VALPROATE 41.8 (L) 11/29/2016   No results found for: CBMZ  Current Medications: Current Outpatient Medications  Medication Sig Dispense Refill   hydrOXYzine  (VISTARIL ) 25 MG capsule Take 1 capsule (25 mg total) by mouth at bedtime. 30 capsule 2   QUEtiapine  (SEROQUEL ) 50 MG tablet Take 1 tablet (50 mg total) by mouth at bedtime. 30 tablet 2   amLODipine -olmesartan  (AZOR ) 10-40 MG tablet Take 1 tablet by mouth daily. 90 tablet 3   atorvastatin  (LIPITOR ) 80 MG tablet Take 1 tablet (80 mg total) by mouth daily. 90 tablet 3   clopidogrel  (PLAVIX ) 75 MG tablet Take 1 tablet (75 mg total) by mouth daily. 90 tablet 3   divalproex  (DEPAKOTE  ER) 500 MG 24 hr tablet Take 1 tablet (500 mg) by mouth at bedtime. 90 tablet 2   gabapentin (NEURONTIN) 300 MG capsule Take 1 capsule at bedtime for 7 days.  You may then increase to 1 capsule twice daily if needed. 90 capsule 0   hydrOXYzine  (VISTARIL ) 50 MG capsule Take 1 capsule (50 mg total) by mouth at bedtime. 30 capsule 2   LORazepam  (ATIVAN ) 1 MG tablet Take 1 tablet (1 mg total) by mouth 3 (three) times daily. 90 tablet 2   PARoxetine  (PAXIL ) 20 MG tablet Take 1 tablet (20 mg total) by mouth at bedtime. 90 tablet 2   tamsulosin  (FLOMAX ) 0.4 MG CAPS capsule Take 1 capsule  (0.4 mg total) by mouth at bedtime. 30 capsule 0   venlafaxine  XR (EFFEXOR  XR) 150 MG 24 hr capsule Take 1 capsule (150 mg total) by mouth daily with breakfast. 30 capsule 2   No current facility-administered medications for this visit.     Musculoskeletal: Strength & Muscle Tone: within normal limits Gait & Station: normal Patient leans: N/A  Psychiatric Specialty Exam: Review of Systems  Musculoskeletal:  Positive for back pain.  Neurological:  Positive for numbness.  All other systems reviewed and are negative.   There were no vitals taken for this visit.There is no height or weight on file to calculate BMI.  General Appearance: Casual and Fairly Groomed  Eye Contact:  Good  Speech:  Clear and Coherent  Volume:  Normal  Mood:  Euthymic  Affect:  Congruent  Thought Process:  Goal Directed  Orientation:  Full (Time, Place, and Person)  Thought Content: WDL   Suicidal Thoughts:  No  Homicidal Thoughts:  No  Memory:  Immediate;   Good Recent;   Good Remote;   NA  Judgement:  Fair  Insight:  Fair  Psychomotor Activity:  Decreased  Concentration:  Concentration: Fair and Attention Span: Fair  Recall:  Fair  Fund of Knowledge: Good  Language: Good  Akathisia:  No  Handed:  Right  AIMS (if indicated): not done  Assets:  Communication Skills Desire for Improvement Resilience Social Support  ADL's:  Intact  Cognition: WNL  Sleep:  Good   Screenings: GAD-7    Flowsheet Row Office Visit from 05/13/2024 in [facility] Office Visit from 10/31/2022 in [facility] [place] Office Visit from 07/31/2022 in [facility] Office Visit from 05/15/2022 in  [facility] Office Visit from 10/12/2021 in [facility]  Total GAD-7 Score 0 0 0 0 0   PHQ2-9    Flowsheet Row Office Visit from 05/13/2024 in [facility] Clinical Support from 05/22/2023 in [facility] Office Visit from 10/31/2022 in [facility] [place] Office Visit from 07/31/2022 in [facility] Office Visit from 05/15/2022 in [facility]  PHQ-2 Total Score 0 0 0 0 0  PHQ-9 Total Score 0 0 0 0 --   Flowsheet Row ED from 11/25/2023 in [facility] ED from 11/18/2023 in [facility] ED to Hosp-Admission (Discharged) from 11/10/2023 in [facility]  C-SSRS RISK CATEGORY No Risk No Risk No Risk     Assessment and Plan: This patient is a 68 year old male with a history of severe depression.  He had an episode of psychosis last summer after he did develop severe hyponatremia.  He seems to be normalizing slowly.  He feels like he is getting back to his old self.  For now he will continue Paxil  20 mg daily as well as venlafaxine  XL 150 mg daily for depression, Depakote  ER 500 mg at bedtime for mood stabilization hydroxyzine  will be cut down to 25 mg at bedtime for sleep and Seroquel  will be cut down to 50 mg at bedtime for mood stabilization.  He will return to see me in 4 weeks  Collaboration of Care: Collaboration of Care: Primary Care Provider AEB notes are shared with PCP on the epic system  Patient/Guardian was advised Release of Information must be obtained prior to any record release in order to collaborate their care with an outside provider. Patient/Guardian was advised if they have not already done so to contact the registration department to sign all necessary forms in order for us  to release information regarding their care.   Consent: Patient/Guardian gives verbal consent for treatment and assignment of benefits for services provided during this visit. Patient/Guardian expressed understanding and agreed to  proceed.    Barnie Gull, MD 05/18/2024, 2:31 PM

## 2024-05-19 ENCOUNTER — Other Ambulatory Visit (HOSPITAL_COMMUNITY): Payer: Self-pay

## 2024-05-21 ENCOUNTER — Ambulatory Visit: Admitting: Vascular Surgery

## 2024-05-21 ENCOUNTER — Other Ambulatory Visit: Payer: Self-pay

## 2024-05-21 ENCOUNTER — Other Ambulatory Visit (HOSPITAL_COMMUNITY): Payer: Self-pay

## 2024-05-21 ENCOUNTER — Ambulatory Visit (HOSPITAL_COMMUNITY)
Admission: RE | Admit: 2024-05-21 | Discharge: 2024-05-21 | Disposition: A | Discharge status: Home / Self Care | Source: Ambulatory Visit | Attending: Vascular Surgery | Admitting: Vascular Surgery

## 2024-05-21 ENCOUNTER — Ambulatory Visit (HOSPITAL_COMMUNITY)
Admission: RE | Admit: 2024-05-21 | Discharge: 2024-05-21 | Disposition: A | Source: Ambulatory Visit | Attending: Vascular Surgery

## 2024-05-21 ENCOUNTER — Encounter: Payer: Self-pay | Admitting: Vascular Surgery

## 2024-05-21 VITALS — BP 126/82 | HR 78 | Temp 97.7°F | Resp 20 | Ht 71.0 in | Wt 257.1 lb

## 2024-05-21 DIAGNOSIS — I739 Peripheral vascular disease, unspecified: Secondary | ICD-10-CM

## 2024-05-21 DIAGNOSIS — I714 Abdominal aortic aneurysm, without rupture, unspecified: Secondary | ICD-10-CM | POA: Diagnosis not present

## 2024-05-21 DIAGNOSIS — Z8679 Personal history of other diseases of the circulatory system: Secondary | ICD-10-CM | POA: Diagnosis not present

## 2024-05-21 DIAGNOSIS — Z9889 Other specified postprocedural states: Secondary | ICD-10-CM | POA: Diagnosis not present

## 2024-05-21 LAB — VAS US ABI WITH/WO TBI
Left ABI: 1.17
Right ABI: 1.18

## 2024-05-21 NOTE — Progress Notes (Signed)
 Office Note     CC:  AAA Requesting Provider:  Jolinda Norene HERO, DO  HPI: Lawrence Bennett is a 68 y.o. (Jul 25, 1955) male whom I called in follow-up s/p 08/21/21 EVAR for 5.7cm abdominal aortic aneurysm.  On exam today, Lawrence Bennett was doing well, accompanied by his wife.  Lawrence Bennett has had no issues since Lawrence Bennett was seen last in clinic.  Lawrence Bennett denies abdominal pain, back pain, chest pain.  Lawrence Bennett denies symptoms of claudication, no rest pain in the feet, no tissue loss.  Since last seen, Lawrence Bennett suffered a fall which resulted in 2 herniated disks.  Lawrence Bennett is ambulating with a cane.   Last Bmp 10/2023 - no renal insufficiency.    The pt is  on a statin for cholesterol management.  The pt is not on a daily aspirin .   Other AC:  plavix  The pt is on on medication for hypertension.   The pt is not diabetic.  Tobacco hx:  none  Past Medical History:  Diagnosis Date   AAA (abdominal aortic aneurysm)    Anxiety    Atherosclerosis of aorta    Atrial contractions, premature    Depression    Dyslipidemia    Dyspnea    Elevated glucose 11/11/2015   Hyperlipidemia    Mild hypertension    Stroke [facility])    Urinary hesitancy     Past Surgical History:  Procedure Laterality Date   ABDOMINAL AORTIC ENDOVASCULAR STENT GRAFT Bilateral 08/21/2021   Procedure: ENDOVASCULAR AORTIC STENT GRAFT REPAIR;  Surgeon: Lanis Fonda BRAVO, MD;  Location: [facility] OR;  Service: Vascular;  Laterality: Bilateral;   COLON RESECTION  2012   ULTRASOUND GUIDANCE FOR VASCULAR ACCESS Bilateral 08/21/2021   Procedure: ULTRASOUND GUIDANCE FOR VASCULAR ACCESS;  Surgeon: Lanis Fonda BRAVO, MD;  Location[facility];  Service: Vascular;  Laterality: Bilateral;    Social History   Socioeconomic History   Marital status: Married    Spouse name: Not on file   Number of children: 3   Years of education: Not on file   Highest education level: Not on file  Occupational History   Occupation: retired  Tobacco Use   Smoking status: Former    Current  packs/day: 0.00    Average packs/day: 0.2 packs/day for 19.0 years (3.8 ttl pk-yrs)    Types: Cigarettes    Start date: 37    Quit date: 2015    Years since quitting: 10.8   Smokeless tobacco: Never  Vaping Use   Vaping status: Never Used  Substance and Sexual Activity   Alcohol use: No    Alcohol/week: 0.0 standard drinks of alcohol   Drug use: No   Sexual activity: Not Currently  Other Topics Concern   Not on file  Social History Narrative   Right handed   Lives with wife   Social Drivers of Health   Financial Resource Strain: Low Risk  (05/22/2023)   Overall Financial Resource Strain (CARDIA)    Difficulty of Paying Living Expenses: Not hard at all  Food Insecurity: No Food Insecurity (11/11/2023)   Hunger Vital Sign    Worried About Running Out of Food in the Last Year: Never true    Ran Out of Food in the Last Year: Never true  Transportation Needs: No Transportation Needs (11/11/2023)   PRAPARE - Administrator, Civil Service (Medical): No    Lack of Transportation (Non-Medical): No  Physical Activity: Insufficiently Active (05/22/2023)   Exercise Vital Sign  Days of Exercise per Week: 7 days    Minutes of Exercise per Session: 20 min  Stress: No Stress Concern Present (05/22/2023)   [employer] of Occupational Health - Occupational Stress Questionnaire    Feeling of Stress : Not at all  Social Connections: Moderately Integrated (11/11/2023)   Social Connection and Isolation Panel    Frequency of Communication with Friends and Family: More than three times a week    Frequency of Social Gatherings with Friends and Family: More than three times a week    Attends Religious Services: More than 4 times per year    Active Member of [employer] or Organizations: No    Attends Banker Meetings: Never    Marital Status: Married  Catering Manager Violence: Not At Risk (11/11/2023)   Humiliation, Afraid, Rape, and Kick questionnaire    Fear of  Current or Ex-Partner: No    Emotionally Abused: No    Physically Abused: No    Sexually Abused: No    Family History  Problem Relation Age of Onset   COPD Mother    Stroke Father    Bipolar disorder Father    Hypertension Sister    Depression Sister    Colon cancer Neg Hx    Esophageal cancer Neg Hx    Inflammatory bowel disease Neg Hx    Liver disease Neg Hx    Rectal cancer Neg Hx    Pancreatic cancer Neg Hx    Stomach cancer Neg Hx     Current Outpatient Medications  Medication Sig Dispense Refill   amLODipine -olmesartan  (AZOR ) 10-40 MG tablet Take 1 tablet by mouth daily. 90 tablet 3   atorvastatin  (LIPITOR ) 80 MG tablet Take 1 tablet (80 mg total) by mouth daily. 90 tablet 3   clopidogrel  (PLAVIX ) 75 MG tablet Take 1 tablet (75 mg total) by mouth daily. 90 tablet 3   divalproex  (DEPAKOTE  ER) 500 MG 24 hr tablet Take 1 tablet (500 mg) by mouth at bedtime. 90 tablet 2   gabapentin (NEURONTIN) 300 MG capsule Take 1 capsule at bedtime for 7 days.  You may then increase to 1 capsule twice daily if needed. 90 capsule 0   hydrOXYzine  (VISTARIL ) 25 MG capsule Take 1 capsule (25 mg total) by mouth at bedtime. 30 capsule 2   hydrOXYzine  (VISTARIL ) 50 MG capsule Take 1 capsule (50 mg total) by mouth at bedtime. 30 capsule 2   LORazepam  (ATIVAN ) 1 MG tablet Take 1 tablet (1 mg total) by mouth 3 (three) times daily. 90 tablet 2   PARoxetine  (PAXIL ) 20 MG tablet Take 1 tablet (20 mg total) by mouth at bedtime. 90 tablet 2   QUEtiapine  (SEROQUEL ) 50 MG tablet Take 1 tablet (50 mg total) by mouth at bedtime. 30 tablet 2   tamsulosin  (FLOMAX ) 0.4 MG CAPS capsule Take 1 capsule (0.4 mg total) by mouth at bedtime. 30 capsule 0   venlafaxine  XR (EFFEXOR  XR) 150 MG 24 hr capsule Take 1 capsule (150 mg total) by mouth daily with breakfast. 30 capsule 2   No current facility-administered medications for this visit.    No Known Allergies   REVIEW OF SYSTEMS:   [X]  denotes positive finding,  [ ]  denotes negative finding Cardiac  Comments:  Chest pain or chest pressure:    Shortness of breath upon exertion:    Short of breath when lying flat:    Irregular heart rhythm:        Vascular    Pain  in calf, thigh, or hip brought on by ambulation:    Pain in feet at night that wakes you up from your sleep:     Blood clot in your veins:    Leg swelling:         Pulmonary    Oxygen at home:    Productive cough:     Wheezing:         Neurologic    Sudden weakness in arms or legs:     Sudden numbness in arms or legs:     Sudden onset of difficulty speaking or slurred speech:    Temporary loss of vision in one eye:     Problems with dizziness:         Gastrointestinal    Blood in stool:     Vomited blood:         Genitourinary    Burning when urinating:     Blood in urine:        Psychiatric    Major depression:         Hematologic    Bleeding problems:    Problems with blood clotting too easily:        Skin    Rashes or ulcers:        Constitutional    Fever or chills:      PHYSICAL EXAMINATION:  Vitals:   05/21/24 0847  BP: 126/82  Pulse: 78  Resp: 20  Temp: 97.7 F (36.5 C)  TempSrc: Temporal  SpO2: 95%  Weight: 257 lb 1.6 oz (116.6 kg)  Height: 5' 11 (1.803 m)   Physical exam deferred due to phone call.    Non-Invasive Vascular Imaging:    Endovascular Aortic Repair (EVAR):  +----------+----------------+-------------------+-------------------+           Diameter AP (cm)Diameter Trans (cm)Velocities (cm/sec)  +----------+----------------+-------------------+-------------------+  Aorta    4.71            4.99               83                   +----------+----------------+-------------------+-------------------+  Right Limb1.27            1.49               90                   +----------+----------------+-------------------+-------------------+  Left Limb 1.40            1.57               80                    +----------+----------------+-------------------+-------------------+   Summary:  Abdominal Aorta: The largest aortic measurement is 5.0 cm. Patent  endovascular aneurysm repair with no evidence of endoleak. The largest  aortic diameter remains essentially unchanged compared to prior exam.  Previous diameter measurement was 5.0 cm  obtained on 03/22/23.    ABI Findings:  +---------+------------------+-----+---------+--------+  Right   Rt Pressure (mmHg)IndexWaveform Comment   +---------+------------------+-----+---------+--------+  Brachial 130                                       +---------+------------------+-----+---------+--------+  PTA     159               1.18 triphasic          +---------+------------------+-----+---------+--------+  DP      156               1.16 triphasic          +---------+------------------+-----+---------+--------+  Great Toe127               0.94                    +---------+------------------+-----+---------+--------+   +---------+------------------+-----+---------+-------+  Left    Lt Pressure (mmHg)IndexWaveform Comment  +---------+------------------+-----+---------+-------+  Brachial 135                                      +---------+------------------+-----+---------+-------+  PTA     158               1.17 triphasic         +---------+------------------+-----+---------+-------+  DP      147               1.09 triphasic         +---------+------------------+-----+---------+-------+  Great Toe115               0.85                   +---------+------------------+-----+---------+-------+   +-------+-----------+-----------+------------+------------+  ABI/TBIToday's ABIToday's TBIPrevious ABIPrevious TBI  +-------+-----------+-----------+------------+------------+  Right 1.18       0.94       1.14        0.95           +-------+-----------+-----------+------------+------------+  Left  1.17       0.85       1.15        0.90          +-------+-----------+-----------+------------+------------+     ASSESSMENT/PLAN: Lawrence Bennett is a 68 y.o. male presenting s/p 08/21/21 EVAR for asymptomatic AAA.  Since last seen, Lawrence Bennett has been doing well.  No change in aneurysm sac size.  No change in ABI.  My plan is for [facility] to follow-up in 12 months with repeat EVAR duplex, ABI. I asked that Lawrence Bennett continue his current medication regimen.     Fonda FORBES Rim, MD [facility] [phone]

## 2024-05-22 ENCOUNTER — Other Ambulatory Visit (HOSPITAL_COMMUNITY): Payer: Self-pay

## 2024-05-25 ENCOUNTER — Telehealth: Payer: Self-pay | Admitting: Family Medicine

## 2024-05-25 NOTE — Telephone Encounter (Signed)
 Called pt and he answered phone but did not say anything else. Will send mychart message

## 2024-05-25 NOTE — Telephone Encounter (Signed)
 Attempted to reach, no VM and no answer.  Ok to discontinue. No wean needed since has only been on for 2 weeks.

## 2024-05-25 NOTE — Telephone Encounter (Unsigned)
 Copied from CRM #[id number]. Topic: Referral - Status >> May 25, 2024  2:17 PM Susanna ORN wrote: Reason for CRM: Patient states that it has been over two weeks and he hasn't heard anything back as far as being scheduled with a urologist. The referral was placed already. Please give patient a call in regards to referral.

## 2024-05-25 NOTE — Telephone Encounter (Signed)
 Copied from CRM #[id number]. Topic: Clinical - Medication Question >> May 25, 2024  2:13 PM Susanna ORN wrote: Reason for CRM: Patient states that he was placed on Gabapentin  300 mg by Dr. Jolinda for tingling of the hands. Patient states that he doesn't like the side effects from the medication. He states that it makes him really sleepy & it's causing him to have double vision in his eyesight. He wants to discontinue taking it but wants to speak with Dr. Jolinda first.

## 2024-05-26 NOTE — Telephone Encounter (Unsigned)
 Copied from CRM [id number]. Topic: General - Other >> May 25, 2024  4:23 PM Sophia H wrote: Reason for CRM: missed call from PCP - was going to read note and advise of discontinuing med but patient stated he really wanted to speak with her.  When I called [facility] no one was available.

## 2024-05-26 NOTE — Telephone Encounter (Signed)
 Referral sent to: [facility] [address] [place], [address] - [place] [address] [phone]  MyChart Message sent to Patient with Specialty Office contact information.

## 2024-06-03 NOTE — Progress Notes (Signed)
 Lawrence Bennett                                          MRN: [id number]   06/03/2024   The [facility] Quality Team Specialist reviewed this patient medical record for the purposes of chart review for care gap closure. The following were reviewed: chart review for care gap closure-glycemic status assessment.    [facility] Quality Team

## 2024-06-08 ENCOUNTER — Other Ambulatory Visit (HOSPITAL_COMMUNITY): Payer: Self-pay

## 2024-06-08 ENCOUNTER — Other Ambulatory Visit: Payer: Self-pay

## 2024-06-09 ENCOUNTER — Other Ambulatory Visit: Payer: Self-pay

## 2024-06-11 ENCOUNTER — Other Ambulatory Visit: Payer: Self-pay

## 2024-06-11 ENCOUNTER — Ambulatory Visit: Admitting: Physical Medicine and Rehabilitation

## 2024-06-11 VITALS — BP 128/85 | HR 90

## 2024-06-11 DIAGNOSIS — M5416 Radiculopathy, lumbar region: Secondary | ICD-10-CM

## 2024-06-11 MED ORDER — METHYLPREDNISOLONE ACETATE 40 MG/ML IJ SUSP
40.0000 mg | Freq: Once | INTRAMUSCULAR | Status: AC
  Administered 2024-06-11: 40 mg

## 2024-06-11 NOTE — Progress Notes (Unsigned)
 Pain Scale   Average Pain 6 Patient advising he has lower back pan radiating to right leg. Patient pain is constant.        +Driver, -BT, -Dye Allergies.

## 2024-06-17 ENCOUNTER — Other Ambulatory Visit (HOSPITAL_COMMUNITY): Payer: Self-pay

## 2024-06-17 ENCOUNTER — Other Ambulatory Visit: Payer: Self-pay

## 2024-06-20 ENCOUNTER — Other Ambulatory Visit: Payer: Self-pay | Admitting: Family Medicine

## 2024-06-20 DIAGNOSIS — M5412 Radiculopathy, cervical region: Secondary | ICD-10-CM

## 2024-06-22 ENCOUNTER — Telehealth: Payer: Self-pay | Admitting: Family Medicine

## 2024-06-22 NOTE — Telephone Encounter (Signed)
 Copied from CRM #[id number]. Topic: General - Call Back - No Documentation >> Jun 22, 2024  2:25 PM [name] wrote: Reason for CRM: Patient calling back. No documentation of who called the patient.  Call back# [phone]

## 2024-06-22 NOTE — Procedures (Signed)
 Lumbosacral Transforaminal Epidural Steroid Injection - Sub-Pedicular Approach with Fluoroscopic Guidance  Patient: Lawrence Bennett      Date of Birth: 06-11-1956 MRN: [id number] PCP: Jolinda Norene HERO, DO      Visit Date: 06/11/2024   Universal Protocol:    Date/Time: 06/11/2024  Consent Given By: the patient  Position: PRONE  Additional Comments: Vital signs were monitored before and after the procedure. Patient was prepped and draped in the usual sterile fashion. The correct patient, procedure, and site was verified.   Injection Procedure Details:   Procedure diagnoses: Lumbar radiculopathy [M54.16]    Meds Administered:  Meds ordered this encounter  Medications   methylPREDNISolone  acetate (DEPO-MEDROL ) injection 40 mg    Laterality: Right  Location/Site: L5  Needle:5.0 in., 22 ga.  Short bevel or Quincke spinal needle  Needle Placement: Transforaminal  Findings:    -Comments: Excellent flow of contrast along the nerve, nerve root and into the epidural space.  Procedure Details: After squaring off the end-plates to get a true AP view, the C-arm was positioned so that an oblique view of the foramen as noted above was visualized. The target area is just inferior to the nose of the scotty dog or sub pedicular. The soft tissues overlying this structure were infiltrated with 2-3 ml. of 1% Lidocaine  without Epinephrine.  The spinal needle was inserted toward the target using a trajectory view along the fluoroscope beam.  Under AP and lateral visualization, the needle was advanced so it did not puncture dura and was located close the 6 O'Clock position of the pedical in AP tracterory. Biplanar projections were used to confirm position. Aspiration was confirmed to be negative for CSF and/or blood. A 1-2 ml. volume of Isovue -250 was injected and flow of contrast was noted at each level. Radiographs were obtained for documentation purposes.   After attaining the desired  flow of contrast documented above, a 0.5 to 1.0 ml test dose of 0.25% Marcaine was injected into each respective transforaminal space.  The patient was observed for 90 seconds post injection.  After no sensory deficits were reported, and normal lower extremity motor function was noted,   the above injectate was administered so that equal amounts of the injectate were placed at each foramen (level) into the transforaminal epidural space.   Additional Comments:  The patient tolerated the procedure well Dressing: 2 x 2 sterile gauze and Band-Aid    Post-procedure details: Patient was observed during the procedure. Post-procedure instructions were reviewed.  Patient left the clinic in stable condition.

## 2024-06-22 NOTE — Progress Notes (Signed)
 "  Lawrence Bennett - 68 y.o. male MRN [id number]  Date of birth: 1956/02/05  Office Visit Note: Visit Date: 06/11/2024 PCP: Jolinda Norene HERO, DO Referred by: Jolinda Norene HERO, DO  Subjective: Chief Complaint  Patient presents with   Lower Back - Pain   HPI:  Lawrence Bennett is a 68 y.o. male who comes in today at the request of Duwaine Pouch, FNP for planned Right L5-S1 Lumbar Transforaminal epidural steroid injection with fluoroscopic guidance.  The patient has failed conservative care including home exercise, medications, time and activity modification.  This injection will be diagnostic and hopefully therapeutic.  Please see requesting physician notes for further details and justification.   ROS Otherwise per HPI.  Assessment & Plan: Visit Diagnoses:    ICD-10-CM   1. Lumbar radiculopathy  M54.16 XR C-ARM NO REPORT    Epidural Steroid injection    methylPREDNISolone  acetate (DEPO-MEDROL ) injection 40 mg      Plan: No additional findings.   Meds & Orders:  Meds ordered this encounter  Medications   methylPREDNISolone  acetate (DEPO-MEDROL ) injection 40 mg    Orders Placed This Encounter  Procedures   XR C-ARM NO REPORT   Epidural Steroid injection    Follow-up: Return for visit to requesting provider as needed.   Procedures: No procedures performed  Lumbosacral Transforaminal Epidural Steroid Injection - Sub-Pedicular Approach with Fluoroscopic Guidance  Patient: Lawrence Bennett      Date of Birth: [birth year] MRN: [id number] PCP: Jolinda Norene HERO, DO      Visit Date: 06/11/2024   Universal Protocol:    Date/Time: 06/11/2024  Consent Given By: the patient  Position: PRONE  Additional Comments: Vital signs were monitored before and after the procedure. Patient was prepped and draped in the usual sterile fashion. The correct patient, procedure, and site was verified.   Injection Procedure Details:   Procedure diagnoses: Lumbar radiculopathy  [M54.16]    Meds Administered:  Meds ordered this encounter  Medications   methylPREDNISolone  acetate (DEPO-MEDROL ) injection 40 mg    Laterality: Right  Location/Site: L5  Needle:5.0 in., 22 ga.  Short bevel or Quincke spinal needle  Needle Placement: Transforaminal  Findings:    -Comments: Excellent flow of contrast along the nerve, nerve root and into the epidural space.  Procedure Details: After squaring off the end-plates to get a true AP view, the C-arm was positioned so that an oblique view of the foramen as noted above was visualized. The target area is just inferior to the nose of the scotty dog or sub pedicular. The soft tissues overlying this structure were infiltrated with 2-3 ml. of 1% Lidocaine  without Epinephrine.  The spinal needle was inserted toward the target using a trajectory view along the fluoroscope beam.  Under AP and lateral visualization, the needle was advanced so it did not puncture dura and was located close the 6 O'Clock position of the pedical in AP tracterory. Biplanar projections were used to confirm position. Aspiration was confirmed to be negative for CSF and/or blood. A 1-2 ml. volume of Isovue -250 was injected and flow of contrast was noted at each level. Radiographs were obtained for documentation purposes.   After attaining the desired flow of contrast documented above, a 0.5 to 1.0 ml test dose of 0.25% Marcaine was injected into each respective transforaminal space.  The patient was observed for 90 seconds post injection.  After no sensory deficits were reported, and normal lower extremity motor function was noted,   the above  injectate was administered so that equal amounts of the injectate were placed at each foramen (level) into the transforaminal epidural space.   Additional Comments:  The patient tolerated the procedure well Dressing: 2 x 2 sterile gauze and Band-Aid    Post-procedure details: Patient was observed during the  procedure. Post-procedure instructions were reviewed.  Patient left the clinic in stable condition.    Clinical History: EXAM: MRI LUMBAR SPINE 04/08/2024 06:15:25 PM   TECHNIQUE: Multiplanar multisequence MRI of the lumbar spine was performed without the administration of intravenous contrast.   COMPARISON: Lumbar spine radiographs 03/31/2024.   CLINICAL HISTORY: Low back pain, trauma. Low back pain, trauma; Chronic bilateral low back pain without sciatica.   FINDINGS:   BONES AND ALIGNMENT: Normal alignment. Slight degenerative anterolisthesis is present at L3-4. Normal vertebral body heights. Bone marrow signal is unremarkable. Chronic type 2 Modic changes are present at L5-S1. Inferior endplate Schmorl's nodes are present at L2, L3, and L4.   SPINAL CORD: The conus terminates normally.   SOFT TISSUES: No paraspinal mass. A 12 mm simple cyst is present at the upper pole of the right kidney. An abdominal aortic aneurysm anterior to L3 measures 51 mm in transverse diameter, just above the bifurcation.   L1-L2: Mild disc bulging and facet hypertrophy is present at L1-2 without focal stenosis.   L2-L3: A broad-based disc protrusion was present at L2-3. Mild left subarticular and bilateral foraminal stenosis is present.   L3-L4: Broad-based disc protrusion and moderate bilateral facet hypertrophy is present at L3-4 with mild left subarticular and moderate bilateral foraminal stenosis.   L4-L5: Disc protrusion and asymmetric left facet hypertrophy is present at L4-5. Moderate left and mild right foraminal stenosis is present.   L5-S1: Chronic loss of disc height is present at L5-S1 without focal disc protrusion or stenosis.   IMPRESSION: 1. Broad-based disc protrusion at L2-3 with mild left subarticular and bilateral foraminal stenosis. 2. Broad-based disc protrusion and moderate bilateral facet hypertrophy at L3-4 with mild left subarticular and moderate  bilateral foraminal stenosis. 3. Disc protrusion and asymmetric left facet hypertrophy at L4-5 with moderate left and mild right foraminal stenosis. 4. Abdominal aortic aneurysm measuring 51 mm just above the bifurcation. Recommend vascular surgical consultation and repeat imaging in 6 months.   Electronically signed by: Lonni Necessary MD 04/12/2024 01:00 PM EDT RP Workstation: [id number]     Objective:  VS:  HT:    WT:   BMI:     BP:128/85  HR:90bpm  TEMP: ( )  RESP:  Physical Exam Vitals and nursing note reviewed.  Constitutional:      General: He is not in acute distress.    Appearance: Normal appearance. He is not ill-appearing.  HENT:     Head: Normocephalic and atraumatic.     Right Ear: External ear normal.     Left Ear: External ear normal.     Nose: No congestion.  Eyes:     Extraocular Movements: Extraocular movements intact.  Cardiovascular:     Rate and Rhythm: Normal rate.     Pulses: Normal pulses.  Pulmonary:     Effort: Pulmonary effort is normal. No respiratory distress.  Abdominal:     General: There is no distension.     Palpations: Abdomen is soft.  Musculoskeletal:        General: No tenderness or signs of injury.     Cervical back: Neck supple.     Right lower leg: No edema.  Left lower leg: No edema.     Comments: Patient has good distal strength without clonus.  Skin:    Findings: No erythema or rash.  Neurological:     General: No focal deficit present.     Mental Status: He is alert and oriented to person, place, and time.     Sensory: No sensory deficit.     Motor: No weakness or abnormal muscle tone.     Coordination: Coordination normal.  Psychiatric:        Mood and Affect: Mood normal.        Behavior: Behavior normal.      Imaging: No results found. "

## 2024-06-22 NOTE — Telephone Encounter (Signed)
 Returned patient call, advised patient that I am unsure why he had a missed call from our office as there was no documentation in his chart. Patient verbalized understanding.

## 2024-06-22 NOTE — Telephone Encounter (Signed)
 Copied from CRM #[id number]. Topic: Clinical - Medication Question >> May 25, 2024  2:13 PM Susanna ORN wrote: Reason for CRM: Patient states that he was placed on Gabapentin  300 mg by Dr. Jolinda for tingling of the hands. Patient states that he doesn't like the side effects from the medication. He states that it makes him really sleepy & it's causing him to have double vision in his eyesight. He wants to discontinue taking it but wants to speak with Dr. Jolinda first.

## 2024-06-23 ENCOUNTER — Other Ambulatory Visit: Payer: Self-pay

## 2024-06-29 ENCOUNTER — Ambulatory Visit (INDEPENDENT_AMBULATORY_CARE_PROVIDER_SITE_OTHER): Admitting: Family Medicine

## 2024-06-29 ENCOUNTER — Encounter: Payer: Self-pay | Admitting: Family Medicine

## 2024-06-29 ENCOUNTER — Ambulatory Visit

## 2024-06-29 ENCOUNTER — Ambulatory Visit (INDEPENDENT_AMBULATORY_CARE_PROVIDER_SITE_OTHER)

## 2024-06-29 ENCOUNTER — Ambulatory Visit: Payer: Self-pay | Admitting: Family Medicine

## 2024-06-29 VITALS — BP 122/72 | HR 86 | Temp 97.5°F | Ht 71.0 in | Wt 263.4 lb

## 2024-06-29 DIAGNOSIS — F319 Bipolar disorder, unspecified: Secondary | ICD-10-CM | POA: Insufficient documentation

## 2024-06-29 DIAGNOSIS — R058 Other specified cough: Secondary | ICD-10-CM | POA: Diagnosis not present

## 2024-06-29 DIAGNOSIS — Z1211 Encounter for screening for malignant neoplasm of colon: Secondary | ICD-10-CM

## 2024-06-29 DIAGNOSIS — N401 Enlarged prostate with lower urinary tract symptoms: Secondary | ICD-10-CM | POA: Diagnosis not present

## 2024-06-29 DIAGNOSIS — E782 Mixed hyperlipidemia: Secondary | ICD-10-CM

## 2024-06-29 DIAGNOSIS — M5412 Radiculopathy, cervical region: Secondary | ICD-10-CM

## 2024-06-29 DIAGNOSIS — M51362 Other intervertebral disc degeneration, lumbar region with discogenic back pain and lower extremity pain: Secondary | ICD-10-CM

## 2024-06-29 DIAGNOSIS — R7303 Prediabetes: Secondary | ICD-10-CM

## 2024-06-29 DIAGNOSIS — Z8673 Personal history of transient ischemic attack (TIA), and cerebral infarction without residual deficits: Secondary | ICD-10-CM

## 2024-06-29 DIAGNOSIS — Z0000 Encounter for general adult medical examination without abnormal findings: Secondary | ICD-10-CM | POA: Diagnosis not present

## 2024-06-29 DIAGNOSIS — R3912 Poor urinary stream: Secondary | ICD-10-CM

## 2024-06-29 LAB — BAYER DCA HB A1C WAIVED: HB A1C (BAYER DCA - WAIVED): 5.8 % — ABNORMAL HIGH (ref 4.8–5.6)

## 2024-06-29 LAB — LIPID PANEL

## 2024-06-29 MED ORDER — DESLORATADINE 5 MG PO TABS: 5.0000 mg | ORAL_TABLET | Freq: Every day | ORAL | 99 refills | Status: AC | PRN

## 2024-06-29 MED ORDER — DOXYCYCLINE HYCLATE 100 MG PO TABS: 100.0000 mg | ORAL_TABLET | Freq: Two times a day (BID) | ORAL | 0 refills | Status: AC

## 2024-06-29 NOTE — Patient Instructions (Signed)
 Take the doxycycline  with FOOD twice daily for 10 days Clarinex  added to use ONCE daily as needed for runny nose/ postnasal drainage We talked about reducing portion sizes and sticking to plant based sides when you eat ([employer] are GREAT). Follow up with the eye doctor if you continue to have blurred vision.

## 2024-06-29 NOTE — Progress Notes (Signed)
 "  Lawrence Bennett is a 68 y.o. male presents to office today for annual physical exam examination.    Patient is accompanied today by his grandson.  He notes that overall he has been doing okay except for this cough that he has had for over a month.  He describes it as a dry cough.  He admits to rhinorrhea.  No fevers, chills, shortness of breath or wheezing.  Has not utilize anything for symptomology.  He reports that the numbness in his left hand seems better even off of the gabapentin , which caused too much sedation so he discontinued the medicine.  He does report to a couple of weeks of intermittent blurred vision where he feels like his vision is slightly foggy.  He has seen an eye doctor in [place] but not since this started.  Has not contacted them yet for evaluation.  Wears glasses.  Occupation: retired, Marital status: married, Substance use: none Health Maintenance Due  Topic Date Due   COVID-19 Vaccine (4 - 2025-26 season) 03/02/2024   Fecal DNA (Cologuard)  04/13/2024   Medicare Annual Wellness (AWV)  05/21/2024    Immunization History  Administered Date(s) Administered   Fluad Quad(high Dose 65+) 06/10/2020, 04/11/2021, 04/17/2022   Fluad Trivalent(High Dose 65+) 05/23/2023   Influenza Inj Mdck Quad Pf 03/24/2018   Influenza,inj,Quad PF,6+ Mos 03/23/2016, 03/26/2017, 03/26/2019   PFIZER Comirnaty(Gray Top)Covid-19 Tri-Sucrose Vaccine 11/12/2019, 12/03/2019, 06/20/2020   Pneumococcal Conjugate-13 05/26/2018   Pneumococcal Polysaccharide-23 10/07/2019   Tdap 04/23/2018   Zoster Recombinant(Shingrix) 04/11/2021, 10/12/2021   Past Medical History:  Diagnosis Date   AAA (abdominal aortic aneurysm)    Anxiety    Atherosclerosis of aorta    Atrial contractions, premature    Depression    Dyslipidemia    Dyspnea    Elevated glucose 11/11/2015   Hyperlipidemia    Mild hypertension    Stroke (HCC)    Urinary hesitancy    Social History   Socioeconomic History    Marital status: Married    Spouse name: Not on file   Number of children: 3   Years of education: Not on file   Highest education level: 12th grade  Occupational History   Occupation: retired  Tobacco Use   Smoking status: Former    Current packs/day: 0.00    Average packs/day: 0.2 packs/day for 19.0 years (3.8 ttl pk-yrs)    Types: Cigarettes    Start date: 1996    Quit date: 2015    Years since quitting: 11.0   Smokeless tobacco: Never  Vaping Use   Vaping status: Never Used  Substance and Sexual Activity   Alcohol use: No    Alcohol/week: 0.0 standard drinks of alcohol   Drug use: No   Sexual activity: Not Currently  Other Topics Concern   Not on file  Social History Narrative   Right handed   Lives with wife   Social Drivers of Health   Tobacco Use: Medium Risk (06/29/2024)   Patient History    Smoking Tobacco Use: Former    Smokeless Tobacco Use: Never    Passive Exposure: Not on Actuary Strain: Low Risk (06/22/2024)   Overall Financial Resource Strain (CARDIA)    Difficulty of Paying Living Expenses: Not very hard  Food Insecurity: No Food Insecurity (06/22/2024)   Epic    Worried About Radiation Protection Practitioner of Food in the Last Year: Never true    [employer] of Food in the Last Year: Never  true  Transportation Needs: No Transportation Needs (06/22/2024)   Epic    Lack of Transportation (Medical): No    Lack of Transportation (Non-Medical): No  Physical Activity: Insufficiently Active (06/22/2024)   Exercise Vital Sign    Days of Exercise per Week: 6 days    Minutes of Exercise per Session: 20 min  Stress: No Stress Concern Present (06/22/2024)   [employer] of Occupational Health - Occupational Stress Questionnaire    Feeling of Stress: Not at all  Social Connections: Moderately Isolated (06/22/2024)   Social Connection and Isolation Panel    Frequency of Communication with Friends and Family: Three times a week    Frequency of Social  Gatherings with Friends and Family: Twice a week    Attends Religious Services: Patient declined    Active Member of Clubs or Organizations: No    Attends Engineer, Structural: Not on file    Marital Status: Married  Intimate Partner Violence: Not At Risk (11/11/2023)   Humiliation, Afraid, Rape, and Kick questionnaire    Fear of Current or Ex-Partner: No    Emotionally Abused: No    Physically Abused: No    Sexually Abused: No  Depression (PHQ2-9): Low Risk (06/29/2024)   Depression (PHQ2-9)    PHQ-2 Score: 2  Alcohol Screen: Low Risk (05/22/2023)   Alcohol Screen    Last Alcohol Screening Score (AUDIT): 0  Housing: Unknown (06/22/2024)   Epic    Unable to Pay for Housing in the Last Year: No    Number of Times Moved in the Last Year: Not on file    Homeless in the Last Year: No  Utilities: Not At Risk (11/11/2023)   AHC Utilities    Threatened with loss of utilities: No  Health Literacy: Adequate Health Literacy (05/22/2023)   B1300 Health Literacy    Frequency of need for help with medical instructions: Never   Past Surgical History:  Procedure Laterality Date   ABDOMINAL AORTIC ENDOVASCULAR STENT GRAFT Bilateral 08/21/2021   Procedure: ENDOVASCULAR AORTIC STENT GRAFT REPAIR;  Surgeon: Lanis Fonda BRAVO, MD;  Location: [facility] OR;  Service: Vascular;  Laterality: Bilateral;   COLON RESECTION  2012   ULTRASOUND GUIDANCE FOR VASCULAR ACCESS Bilateral 08/21/2021   Procedure: ULTRASOUND GUIDANCE FOR VASCULAR ACCESS;  Surgeon: Lanis Fonda BRAVO, MD;  Location: [facility] OR;  Service: Vascular;  Laterality: Bilateral;   Family History  Problem Relation Age of Onset   COPD Mother    Stroke Father    Bipolar disorder Father    Hypertension Sister    Depression Sister    Colon cancer Neg Hx    Esophageal cancer Neg Hx    Inflammatory bowel disease Neg Hx    Liver disease Neg Hx    Rectal cancer Neg Hx    Pancreatic cancer Neg Hx    Stomach cancer Neg Hx    Current  Medications[1]  Allergies[2]   ROS: Review of Systems Pertinent items noted in HPI and remainder of comprehensive ROS otherwise negative.    Physical exam BP 122/72   Pulse 86   Temp (!) 97.5 F (36.4 C)   Ht 5' 11 (1.803 m)   Wt 263 lb 6 oz (119.5 kg)   SpO2 92%   BMI 36.73 kg/m  General appearance: alert, cooperative, appears stated age, no distress, and morbidly obese Head: Normocephalic, without obvious abnormality, atraumatic Eyes: negative findings: lids and lashes normal, conjunctivae and sclerae normal, corneas clear, and pupils equal, round, reactive to  light and accomodation Ears: TMs obscured by cerumen bilaterally Nose: Nares normal. Septum midline. Mucosa normal. No drainage or sinus tenderness. Throat: lips, mucosa, and tongue normal; teeth and gums normal Neck: no adenopathy, no carotid bruit, supple, symmetrical, trachea midline, and thyroid  not enlarged, symmetric, no tenderness/mass/nodules Back: symmetric, no curvature. ROM normal. No CVA tenderness. Lungs: clear to auscultation bilaterally Chest wall: no tenderness Heart: regular rate and rhythm, S1, S2 normal, no murmur, click, rub or gallop Abdomen: Obese, soft, nontender.  No appreciable hepatosplenomegaly Extremities: extremities normal, atraumatic, no cyanosis or edema Pulses: 2+ and symmetric Skin: Skin color, texture, turgor normal. No rashes or lesions Lymph nodes: No supraclavicular or anterior cervical lymph node enlargement Neuro: Hard of hearing.  Utilizes assistive hearing device     06/29/2024   11:43 AM 05/13/2024    4:03 PM 05/22/2023    3:20 PM  Depression screen PHQ 2/9  Decreased Interest 0 0 0  Down, Depressed, Hopeless 0 0 0  PHQ - 2 Score 0 0 0  Altered sleeping 1 0 0  Tired, decreased energy 1 0 0  Change in appetite 0 0 0  Feeling bad or failure about yourself  0 0 0  Trouble concentrating 0 0 0  Moving slowly or fidgety/restless 0 0 0  Suicidal thoughts 0 0 0  PHQ-9  Score 2 0 0   Difficult doing work/chores Not difficult at all Not difficult at all Not difficult at all     Data saved with a previous flowsheet row definition      05/13/2024    4:03 PM 10/31/2022    1:02 PM 07/31/2022    2:06 PM 05/15/2022    3:00 PM  GAD 7 : Generalized Anxiety Score  Nervous, Anxious, on Edge 0  0 0  Control/stop worrying 0  0 0  Worry too much - different things 0  0 0  Trouble relaxing 0  0 0  Restless 0  0 0  Easily annoyed or irritable 0  0 0  Afraid - awful might happen 0  0 0  Total GAD 7 Score 0  0 0  Anxiety Difficulty Not difficult at all  Not difficult at all Not difficult at all     Information is confidential and restricted. Go to Review Flowsheets to unlock data.    Recent Results (from the past 2160 hours)  VAS US  ABI WITH/WO TBI     Status: None   Collection Time: 05/21/24  9:35 AM  Result Value Ref Range   Right ABI 1.18    Left ABI 1.17    DG Chest 2 View Result Date: 06/29/2024 EXAM: 2 VIEW(S) XRAY OF THE CHEST 06/29/2024 12:26:06 PM COMPARISON: 08/21/2021. CLINICAL HISTORY: cough >1 month FINDINGS: LUNGS AND PLEURA: Low lung volumes. Bilateral interstitial prominence. No focal pulmonary opacity. Blunting of the right costophrenic angle may reflect pleural parenchymal scarring or small effusion. SABRA No pneumothorax. HEART AND MEDIASTINUM: Calcified aortic arch. No acute abnormality of the cardiac and mediastinal silhouettes. BONES AND SOFT TISSUES: No acute osseous abnormality. IMPRESSION: 1. Low lung volumes 2. Bilateral interstitial prominence which may be seen with atypical viral infection or inflammation. Underlying edema not excluded. 3. Small right pleural effusion versus chronic pleural parenchymal scarring. Electronically signed by: Waddell Calk MD 06/29/2024 01:58 PM EST RP Workstation: [id number]   Assessment/ Plan: Lawrence Bennett here for annual physical exam.   Annual physical exam  Cough present for greater than 3 weeks -  Plan:  DG Chest 2 View, doxycycline  (VIBRA -TABS) 100 MG tablet, desloratadine  (CLARINEX ) 5 MG tablet  Benign prostatic hyperplasia with weak urinary stream - Plan: CMP14+EGFR, PSA, CBC with Differential  Degeneration of intervertebral disc of lumbar region with discogenic back pain and lower extremity pain - Plan: CMP14+EGFR  Cervical radiculopathy - Plan: CMP14+EGFR  Mixed hyperlipidemia - Plan: CMP14+EGFR, TSH, Lipid Panel  Pre-diabetes - Plan: CMP14+EGFR, Bayer DCA Hb A1c Waived  History of stroke - Plan: CMP14+EGFR, Lipid Panel  Bipolar 1 disorder (HCC)  Screen for colon cancer - Plan: Cologuard   Obtain chest x-ray given cough present for greater than 3 weeks.  However, I suspect likely drainage related.  Doxycycline  given for empiric coverage but start Clarinex  daily.  Follow-up with urology for ongoing BPH symptoms.  Discussed ways to improve health including lifestyle modification, reduction in carbohydrates and improvement in increased plant-based fiber.  Fasting labs collected today.  A1c demonstrates prediabetes at 5.8 today.  Discussed consideration for Wegovy  given history of stroke to aid in weight loss and reduce cardiovascular risk  Continue to follow-up with psychiatry for bipolar 1 disorder.  Discussed that many of the medications he is on sadly promote obesity and metabolic disturbance so he is going to have to work hard in efforts to combat this.  Cologuard ordered for colon cancer screening.  No red flag signs or symptoms  Counseled on healthy lifestyle choices, including diet (rich in fruits, vegetables and lean meats and low in salt and simple carbohydrates) and exercise (at least 30 minutes of moderate physical activity daily).  Patient to follow up 1 year for CPE  Anahy Esh M. Lavina Resor, DO        [1]  Current Outpatient Medications:    amLODipine -olmesartan  (AZOR ) 10-40 MG tablet, Take 1 tablet by mouth daily., Disp: 90 tablet, Rfl: 3   atorvastatin   (LIPITOR ) 80 MG tablet, Take 1 tablet (80 mg total) by mouth daily., Disp: 90 tablet, Rfl: 3   clopidogrel  (PLAVIX ) 75 MG tablet, Take 1 tablet (75 mg total) by mouth daily., Disp: 90 tablet, Rfl: 3   desloratadine  (CLARINEX ) 5 MG tablet, Take 1 tablet (5 mg total) by mouth daily as needed (runny nose)., Disp: 30 tablet, Rfl: PRN   divalproex  (DEPAKOTE  ER) 500 MG 24 hr tablet, Take 1 tablet (500 mg) by mouth at bedtime., Disp: 90 tablet, Rfl: 2   doxycycline  (VIBRA -TABS) 100 MG tablet, Take 1 tablet (100 mg total) by mouth 2 (two) times daily for 10 days., Disp: 20 tablet, Rfl: 0   hydrOXYzine  (VISTARIL ) 25 MG capsule, Take 1 capsule (25 mg total) by mouth at bedtime., Disp: 30 capsule, Rfl: 2   hydrOXYzine  (VISTARIL ) 50 MG capsule, Take 1 capsule (50 mg total) by mouth at bedtime., Disp: 30 capsule, Rfl: 2   LORazepam  (ATIVAN ) 1 MG tablet, Take 1 tablet (1 mg total) by mouth 3 (three) times daily., Disp: 90 tablet, Rfl: 2   PARoxetine  (PAXIL ) 20 MG tablet, Take 1 tablet (20 mg total) by mouth at bedtime., Disp: 90 tablet, Rfl: 2   QUEtiapine  (SEROQUEL ) 50 MG tablet, Take 1 tablet (50 mg total) by mouth at bedtime., Disp: 30 tablet, Rfl: 2   tamsulosin  (FLOMAX ) 0.4 MG CAPS capsule, Take 1 capsule (0.4 mg total) by mouth at bedtime., Disp: 30 capsule, Rfl: 0   venlafaxine  XR (EFFEXOR  XR) 150 MG 24 hr capsule, Take 1 capsule (150 mg total) by mouth daily with breakfast., Disp: 30 capsule, Rfl: 2 [2] No Known Allergies  "

## 2024-06-30 LAB — CBC WITH DIFFERENTIAL/PLATELET
Basophils Absolute: 0.1 x10E3/uL (ref 0.0–0.2)
Basos: 1 %
EOS (ABSOLUTE): 0.3 x10E3/uL (ref 0.0–0.4)
Eos: 3 %
Hematocrit: 44.5 % (ref 37.5–51.0)
Hemoglobin: 14.7 g/dL (ref 13.0–17.7)
Immature Grans (Abs): 0.1 x10E3/uL (ref 0.0–0.1)
Immature Granulocytes: 1 %
Lymphocytes Absolute: 1.8 x10E3/uL (ref 0.7–3.1)
Lymphs: 21 %
MCH: 29.3 pg (ref 26.6–33.0)
MCHC: 33 g/dL (ref 31.5–35.7)
MCV: 89 fL (ref 79–97)
Monocytes Absolute: 0.9 x10E3/uL (ref 0.1–0.9)
Monocytes: 11 %
Neutrophils Absolute: 5.3 x10E3/uL (ref 1.4–7.0)
Neutrophils: 63 %
Platelets: 313 x10E3/uL (ref 150–450)
RBC: 5.02 x10E6/uL (ref 4.14–5.80)
RDW: 14.8 % (ref 11.6–15.4)
WBC: 8.5 x10E3/uL (ref 3.4–10.8)

## 2024-06-30 LAB — CMP14+EGFR
ALT: 33 IU/L (ref 0–44)
AST: 22 IU/L (ref 0–40)
Albumin: 4.3 g/dL (ref 3.9–4.9)
Alkaline Phosphatase: 123 IU/L (ref 47–123)
BUN/Creatinine Ratio: 11 (ref 10–24)
BUN: 11 mg/dL (ref 8–27)
Bilirubin Total: 0.5 mg/dL (ref 0.0–1.2)
CO2: 21 mmol/L (ref 20–29)
Calcium: 9.8 mg/dL (ref 8.6–10.2)
Chloride: 97 mmol/L (ref 96–106)
Creatinine, Ser: 0.99 mg/dL (ref 0.76–1.27)
Globulin, Total: 2.3 g/dL (ref 1.5–4.5)
Glucose: 86 mg/dL (ref 70–99)
Potassium: 4.7 mmol/L (ref 3.5–5.2)
Sodium: 134 mmol/L (ref 134–144)
Total Protein: 6.6 g/dL (ref 6.0–8.5)
eGFR: 82 mL/min/1.73

## 2024-06-30 LAB — PSA: Prostate Specific Ag, Serum: 3.2 ng/mL (ref 0.0–4.0)

## 2024-06-30 LAB — LIPID PANEL
Cholesterol, Total: 154 mg/dL (ref 100–199)
HDL: 53 mg/dL
LDL CALC COMMENT:: 2.9 ratio (ref 0.0–5.0)
LDL Chol Calc (NIH): 82 mg/dL (ref 0–99)
Triglycerides: 104 mg/dL (ref 0–149)
VLDL Cholesterol Cal: 19 mg/dL (ref 5–40)

## 2024-06-30 LAB — TSH: TSH: 1.65 u[IU]/mL (ref 0.450–4.500)

## 2024-07-01 ENCOUNTER — Ambulatory Visit: Admitting: Urology

## 2024-07-07 ENCOUNTER — Ambulatory Visit

## 2024-07-07 VITALS — Wt 263.0 lb

## 2024-07-07 DIAGNOSIS — Z0000 Encounter for general adult medical examination without abnormal findings: Secondary | ICD-10-CM

## 2024-07-07 NOTE — Patient Instructions (Signed)
 Mr. Brigham,  Thank you for taking the time for your Medicare Wellness Visit. I appreciate your continued commitment to your health goals. Please review the care plan we discussed, and feel free to reach out if I can assist you further.  Please note that Annual Wellness Visits do not include a physical exam. Some assessments may be limited, especially if the visit was conducted virtually. If needed, we may recommend an in-person follow-up with your provider.  Ongoing Care Seeing your primary care provider every 3 to 6 months helps us  monitor your health and provide consistent, personalized care.   Referrals If a referral was made during today's visit and you haven't received any updates within two weeks, please contact the referred provider directly to check on the status.  Recommended Screenings:  Health Maintenance  Topic Date Due   COVID-19 Vaccine (4 - 2025-26 season) 03/02/2024   Cologuard (Stool DNA test)  04/13/2024   Flu Shot  09/29/2024*   Colon Cancer Screening  06/29/2025*   Medicare Annual Wellness Visit  07/07/2025   DTaP/Tdap/Td vaccine (2 - Td or Tdap) 04/23/2028   Pneumococcal Vaccine for age over 17  Completed   Hepatitis C Screening  Completed   Zoster (Shingles) Vaccine  Completed   Meningitis B Vaccine  Aged Out  *Topic was postponed. The date shown is not the original due date.       07/04/2024    8:56 AM  Advanced Directives  Does Patient Have a Medical Advance Directive? No  Would patient like information on creating a medical advance directive? Yes (MAU/Ambulatory/Procedural Areas - Information given)   Information on Advanced Care Planning can be found at [place]  Secretary of [facility] Advance Health Care Directives Advance Health Care Directives ([email or web address]   Vision: Annual vision screenings are recommended for early detection of glaucoma, cataracts, and diabetic retinopathy. These exams can also reveal signs of chronic conditions such as diabetes and  high blood pressure.  Dental: Annual dental screenings help detect early signs of oral cancer, gum disease, and other conditions linked to overall health, including heart disease and diabetes.  Please see the attached documents for additional preventive care recommendations.

## 2024-07-07 NOTE — Progress Notes (Signed)
 "  Chief Complaint  Patient presents with   Medicare Wellness     Subjective:   Lawrence Bennett is a 69 y.o. male who presents for a Medicare Annual Wellness Visit.  Visit info / Clinical Intake: Medicare Wellness Visit Type:: Subsequent Annual Wellness Visit Persons participating in visit and providing information:: patient Medicare Wellness Visit Mode:: Telephone If telephone:: video error Since this visit was completed virtually, some vitals may be partially provided or unavailable. Missing vitals are due to the limitations of the virtual format.: Documented vitals are patient reported If Telephone or Video please confirm:: I connected with patient using audio/video enable telemedicine. I verified patient identity with two identifiers, discussed telehealth limitations, and patient agreed to proceed. Patient Location:: home Provider Location:: office Interpreter Needed?: No Pre-visit prep was completed: yes AWV questionnaire completed by patient prior to visit?: yes Date:: 07/04/24 Living arrangements:: (Patient-Rptd) lives with spouse/significant other Patient's Overall Health Status Rating: (Patient-Rptd) good Typical amount of pain: (!) (Patient-Rptd) a lot Does pain affect daily life?: (!) (Patient-Rptd) yes Are you currently prescribed opioids?: no  Dietary Habits and Nutritional Risks How many meals a day?: (Patient-Rptd) 2 Eats fruit and vegetables daily?: (!) (Patient-Rptd) no Most meals are obtained by: (Patient-Rptd) preparing own meals In the last 2 weeks, have you had any of the following?: none Diabetic:: no  Functional Status Activities of Daily Living (to include ambulation/medication): (Patient-Rptd) Independent Ambulation: (Patient-Rptd) Independent Home Assistive Devices/Equipment: Cane Medication Administration: (Patient-Rptd) Independent Home Management (perform basic housework or laundry): (Patient-Rptd) Independent Manage your own finances?:  (Patient-Rptd) yes Primary transportation is: (Patient-Rptd) driving; family / friends Concerns about vision?: no *vision screening is required for WTM* Concerns about hearing?: no  Fall Screening Falls in the past year?: (Patient-Rptd) 1 Number of falls in past year: (Patient-Rptd) 0 Was there an injury with Fall?: (Patient-Rptd) 1 Fall Risk Category Calculator: (Patient-Rptd) 2 Patient Fall Risk Level: (Patient-Rptd) Moderate Fall Risk  Fall Risk Patient at Risk for Falls Due to: History of fall(s) Fall risk Follow up: Falls evaluation completed; Education provided; Falls prevention discussed  Home and Transportation Safety: All rugs have non-skid backing?: (Patient-Rptd) N/A, no rugs All stairs or steps have railings?: (Patient-Rptd) N/A, no stairs Grab bars in the bathtub or shower?: (Patient-Rptd) yes Have non-skid surface in bathtub or shower?: (Patient-Rptd) yes Good home lighting?: (Patient-Rptd) yes Regular seat belt use?: (Patient-Rptd) yes Hospital stays in the last year:: (!) (Patient-Rptd) yes How many hospital stays:: (Patient-Rptd) 1  Cognitive Assessment Difficulty concentrating, remembering, or making decisions? : (Patient-Rptd) no Will 6CIT or Mini Cog be Completed: no 6CIT or Mini Cog Declined: patient alert, oriented, able to answer questions appropriately and recall recent events  Advance Directives (For Healthcare) Does Patient Have a Medical Advance Directive?: No Would patient like information on creating a medical advance directive?: Yes (MAU/Ambulatory/Procedural Areas - Information given)  Reviewed/Updated  Reviewed/Updated: Reviewed All (Medical, Surgical, Family, Medications, Allergies, Care Teams, Patient Goals)    Allergies (verified) Patient has no known allergies.   Current Medications (verified) Outpatient Encounter Medications as of 07/07/2024  Medication Sig   amLODipine -olmesartan  (AZOR ) 10-40 MG tablet Take 1 tablet by mouth daily.    atorvastatin  (LIPITOR ) 80 MG tablet Take 1 tablet (80 mg total) by mouth daily.   clopidogrel  (PLAVIX ) 75 MG tablet Take 1 tablet (75 mg total) by mouth daily.   desloratadine  (CLARINEX ) 5 MG tablet Take 1 tablet (5 mg total) by mouth daily as needed (runny nose).   divalproex  (DEPAKOTE   ER) 500 MG 24 hr tablet Take 1 tablet (500 mg) by mouth at bedtime.   doxycycline  (VIBRA -TABS) 100 MG tablet Take 1 tablet (100 mg total) by mouth 2 (two) times daily for 10 days.   hydrOXYzine  (VISTARIL ) 25 MG capsule Take 1 capsule (25 mg total) by mouth at bedtime.   hydrOXYzine  (VISTARIL ) 50 MG capsule Take 1 capsule (50 mg total) by mouth at bedtime.   LORazepam  (ATIVAN ) 1 MG tablet Take 1 tablet (1 mg total) by mouth 3 (three) times daily.   PARoxetine  (PAXIL ) 20 MG tablet Take 1 tablet (20 mg total) by mouth at bedtime.   QUEtiapine  (SEROQUEL ) 50 MG tablet Take 1 tablet (50 mg total) by mouth at bedtime.   tamsulosin  (FLOMAX ) 0.4 MG CAPS capsule Take 1 capsule (0.4 mg total) by mouth at bedtime.   venlafaxine  XR (EFFEXOR  XR) 150 MG 24 hr capsule Take 1 capsule (150 mg total) by mouth daily with breakfast.   No facility-administered encounter medications on file as of 07/07/2024.    History: Past Medical History:  Diagnosis Date   AAA (abdominal aortic aneurysm)    Anxiety    Atherosclerosis of aorta    Atrial contractions, premature    Depression    Dyslipidemia    Dyspnea    Elevated glucose 11/11/2015   Hyperlipidemia    Mild hypertension    Stroke [facility])    Urinary hesitancy    Past Surgical History:  Procedure Laterality Date   ABDOMINAL AORTIC ENDOVASCULAR STENT GRAFT Bilateral 08/21/2021   Procedure: ENDOVASCULAR AORTIC STENT GRAFT REPAIR;  Surgeon: Lanis Fonda BRAVO, MD;  Location: [facility] OR;  Service: Vascular;  Laterality: Bilateral;   COLON RESECTION  2012   ULTRASOUND GUIDANCE FOR VASCULAR ACCESS Bilateral 08/21/2021   Procedure: ULTRASOUND GUIDANCE FOR VASCULAR ACCESS;  Surgeon: Lanis Fonda BRAVO, MD;  Location: [facility] OR;  Service: Vascular;  Laterality: Bilateral;   Family History  Problem Relation Age of Onset   COPD Mother    Stroke Father    Bipolar disorder Father    Hypertension Sister    Depression Sister    Colon cancer Neg Hx    Esophageal cancer Neg Hx    Inflammatory bowel disease Neg Hx    Liver disease Neg Hx    Rectal cancer Neg Hx    Pancreatic cancer Neg Hx    Stomach cancer Neg Hx    Social History   Occupational History   Occupation: retired  Tobacco Use   Smoking status: Former    Current packs/day: 0.00    Average packs/day: 0.2 packs/day for 19.0 years (3.8 ttl pk-yrs)    Types: Cigarettes    Start date: 53    Quit date: 2015    Years since quitting: 11.0   Smokeless tobacco: Never  Vaping Use   Vaping status: Never Used  Substance and Sexual Activity   Alcohol use: Never   Drug use: Never   Sexual activity: Not Currently   Tobacco Counseling Counseling given: Not Answered  SDOH Screenings   Food Insecurity: No Food Insecurity (07/07/2024)  Housing: Low Risk (07/07/2024)  Transportation Needs: No Transportation Needs (07/07/2024)  Utilities: Not At Risk (07/07/2024)  Alcohol Screen: Low Risk (05/22/2023)  Depression (PHQ2-9): Low Risk (07/07/2024)  Financial Resource Strain: Low Risk (06/22/2024)  Physical Activity: Insufficiently Active (07/07/2024)  Social Connections: Moderately Isolated (07/07/2024)  Stress: No Stress Concern Present (07/07/2024)  Tobacco Use: Medium Risk (07/07/2024)  Health Literacy: Adequate Health Literacy (07/07/2024)  See flowsheets for full screening details  Depression Screen PHQ 2 & 9 Depression Scale- Over the past 2 weeks, how often have you been bothered by any of the following problems? Little interest or pleasure in doing things: 0 Feeling down, depressed, or hopeless (PHQ Adolescent also includes...irritable): 0 PHQ-2 Total Score: 0 Trouble falling or staying asleep, or sleeping too much: 1 Feeling  tired or having little energy: 1 Poor appetite or overeating (PHQ Adolescent also includes...weight loss): 0 Feeling bad about yourself - or that you are a failure or have let yourself or your family down: 0 Trouble concentrating on things, such as reading the newspaper or watching television (PHQ Adolescent also includes...like school work): 0 Moving or speaking so slowly that other people could have noticed. Or the opposite - being so fidgety or restless that you have been moving around a lot more than usual: 0 Thoughts that you would be better off dead, or of hurting yourself in some way: 0 PHQ-9 Total Score: 2 If you checked off any problems, how difficult have these problems made it for you to do your work, take care of things at home, or get along with other people?: Not difficult at all  Depression Treatment Depression Interventions/Treatment : EYV7-0 Score <4 Follow-up Not Indicated     Goals Addressed             This Visit's Progress    Maintain health and independence   On track            Objective:    Today's Vitals   07/07/24 1300  Weight: 263 lb (119.3 kg)   Body mass index is 36.68 kg/m.  Hearing/Vision screen Hearing Screening - Comments:: Patient is able to hear conversational tones without difficulty. No issues reported.   Vision Screening - Comments:: Wears rx glasses - up to date with routine eye exams with Dr. Alvia  Immunizations and Health Maintenance Health Maintenance  Topic Date Due   COVID-19 Vaccine (4 - 2025-26 season) 03/02/2024   Fecal DNA (Cologuard)  04/13/2024   Influenza Vaccine  09/29/2024 (Originally 01/31/2024)   Colonoscopy  06/29/2025 (Originally 06/24/2023)   Medicare Annual Wellness (AWV)  07/07/2025   DTaP/Tdap/Td (2 - Td or Tdap) 04/23/2028   Pneumococcal Vaccine: 50+ Years  Completed   Hepatitis C Screening  Completed   Zoster Vaccines- Shingrix  Completed   Meningococcal B Vaccine  Aged Out        Assessment/Plan:   This is a routine wellness examination for Lawrence Bennett.  Patient Care Team: Jolinda Norene HERO, DO as PCP - General (Family Medicine) Okey Barnie SAUNDERS, MD as Consulting Physician [facility]) Georjean Darice HERO, MD as Consulting Physician (Neurology) Gilmer Browning, MD as Referring Physician (Otolaryngology) Alvia Norleen BIRCH, MD as Consulting Physician (Ophthalmology) Michele Richardson, DO as Consulting Physician (Cardiology) Lanis Fonda BRAVO, MD as Consulting Physician (Vascular Surgery) Jeronimo Norleen HERO, MD as Referring Physician (Cardiology)  I have personally reviewed and noted the following in the patients chart:   Medical and social history Use of alcohol, tobacco or illicit drugs  Current medications and supplements including opioid prescriptions. Functional ability and status Nutritional status Physical activity Advanced directives List of other physicians Hospitalizations, surgeries, and ER visits in previous 12 months Vitals Screenings to include cognitive, depression, and falls Referrals and appointments  No orders of the defined types were placed in this encounter.  In addition, I have reviewed and discussed with patient certain preventive protocols, quality metrics, and best  practice recommendations. A written personalized care plan for preventive services as well as general preventive health recommendations were provided to patient.   Lawrence Charmaine Browner, LPN   02/02/7972   Return in 1 year (on 07/07/2025).  After Visit Summary: (MyChart) Due to this being a telephonic visit, the after visit summary with patients personalized plan was offered to patient via MyChart   Nurse Notes: Separate telephone note sent for (medication management) HM Addressed: Patient states that he returned Cologuard on 07/06/24  "

## 2024-07-07 NOTE — Progress Notes (Signed)
 "   Chief Complaint: Urinary problems   History of Present Illness: This 69 year old male comes in today with his daughter for evaluation and management of lower urinary tract symptoms.  For quite a few years he has had frequency, urgency and at times a slow stream.  He also has frequency at night.  Does not always feel like he empties out well.  Does not feel like he is getting adequate results from this.  Current IPSS 29/5.  He does have limited mobility.  He rarely leaks except with dribbling after voiding.   Past Medical History:  Past Medical History:  Diagnosis Date   AAA (abdominal aortic aneurysm)    Anxiety    Atherosclerosis of aorta    Atrial contractions, premature    Depression    Dyslipidemia    Dyspnea    Elevated glucose 11/11/2015   Hyperlipidemia    Mild hypertension    Stroke [facility])    Urinary hesitancy     Past Surgical History:  Past Surgical History:  Procedure Laterality Date   ABDOMINAL AORTIC ENDOVASCULAR STENT GRAFT Bilateral 08/21/2021   Procedure: ENDOVASCULAR AORTIC STENT GRAFT REPAIR;  Surgeon: Lanis Fonda BRAVO, MD;  Location: [facility] OR;  Service: Vascular;  Laterality: Bilateral;   COLON RESECTION  2012   ULTRASOUND GUIDANCE FOR VASCULAR ACCESS Bilateral 08/21/2021   Procedure: ULTRASOUND GUIDANCE FOR VASCULAR ACCESS;  Surgeon: Lanis Fonda BRAVO, MD;  Location: [facility] OR;  Service: Vascular;  Laterality: Bilateral;    Allergies:  Allergies[1]  Family History:  Family History  Problem Relation Age of Onset   COPD Mother    Stroke Father    Bipolar disorder Father    Hypertension Sister    Depression Sister    Colon cancer Neg Hx    Esophageal cancer Neg Hx    Inflammatory bowel disease Neg Hx    Liver disease Neg Hx    Rectal cancer Neg Hx    Pancreatic cancer Neg Hx    Stomach cancer Neg Hx     Social History:  Social History[2]  Review of symptoms:  Constitutional:  Negative for unexplained weight loss, night sweats, fever, chills ENT:   Negative for nose bleeds, sinus pain, painful swallowing CV:  Negative for chest pain, shortness of breath, exercise intolerance, palpitations, loss of consciousness Resp:  Negative for cough, wheezing, shortness of breath GI:  Negative for nausea, vomiting, diarrhea, bloody stools GU:  Positives noted in HPI; otherwise negative for gross hematuria, dysuria, urinary incontinence Neuro:  Negative for seizures, poor balance, limb weakness, slurred speech Psych:  Negative for lack of energy, depression, anxiety Endocrine:  Negative for polydipsia, polyuria, symptoms of hypoglycemia (dizziness, hunger, sweating) Hematologic:  Negative for anemia, purpura, petechia, prolonged or excessive bleeding, use of anticoagulants  Allergic:  Negative for difficulty breathing or choking as a result of exposure to anything; no shellfish allergy; no allergic response (rash/itch) to materials, foods  Physical exam: There were no vitals taken for this visit. GENERAL APPEARANCE:  Well appearing, well developed, well nourished, NAD HEENT: Atraumatic, Normocephalic. NECK: Normal appearance LUNGS: Normal inspiratory and expiratory excursion HEART: Regular Rate ABDOMEN: Obese, no inguinal hernia GU: Phallus normal, no lesions. Scrotal skin normal. Testicles/epididymal structures normal. Meatus normal. Normal anal sphincter tone, prostate 40 mL, symmetric, non nodular, non tender. EXTREMITIES: Moves all extremities well.  Without clubbing, cyanosis, or edema. NEUROLOGIC:  Alert and oriented x 3, normal gait, CN II-XII grossly intact.  MENTAL STATUS:  Appropriate. SKIN:  Warm, dry  and intact.    Results:  I have reviewed referring/prior physicians notes  I have reviewed urinalysis--clear  Residual urine volume 0  I have reviewed PSA results--3.2 on 12.29.2025  I have reviewed prior imaging--CT angiogram from 08/2021 reviewed--prostate nml sized   Assessment: 1.  Overactive bladder symptoms.  He does not  empty out well  2.  BPH.  Slight enlargement of prostate, I do not think the majority of his symptoms are due to that  3.  Postvoid dribbling    Plan: 1.  Overactive bladder guide sheet given  2.  For now, continue tamsulosin   3.  Urethral stripping after voiding  4.  I will have him come back in a few weeks to recheck symptoms.  If significant, we will consider OAB medication     [1] No Known Allergies [2]  Social History Tobacco Use   Smoking status: Former    Current packs/day: 0.00    Average packs/day: 0.2 packs/day for 19.0 years (3.8 ttl pk-yrs)    Types: Cigarettes    Start date: 63    Quit date: 2015    Years since quitting: 11.0   Smokeless tobacco: Never  Vaping Use   Vaping status: Never Used  Substance Use Topics   Alcohol use: No    Alcohol/week: 0.0 standard drinks of alcohol   Drug use: No   "

## 2024-07-08 ENCOUNTER — Ambulatory Visit: Admitting: Urology

## 2024-07-08 ENCOUNTER — Other Ambulatory Visit: Payer: Self-pay

## 2024-07-08 VITALS — BP 145/89 | HR 94 | Ht 71.0 in | Wt 261.6 lb

## 2024-07-08 DIAGNOSIS — N3943 Post-void dribbling: Secondary | ICD-10-CM | POA: Diagnosis not present

## 2024-07-08 DIAGNOSIS — R3915 Urgency of urination: Secondary | ICD-10-CM | POA: Diagnosis not present

## 2024-07-08 DIAGNOSIS — N401 Enlarged prostate with lower urinary tract symptoms: Secondary | ICD-10-CM | POA: Diagnosis not present

## 2024-07-08 DIAGNOSIS — N3281 Overactive bladder: Secondary | ICD-10-CM

## 2024-07-08 DIAGNOSIS — R350 Frequency of micturition: Secondary | ICD-10-CM

## 2024-07-08 LAB — URINALYSIS, ROUTINE W REFLEX MICROSCOPIC
Bilirubin, UA: NEGATIVE
Glucose, UA: NEGATIVE
Ketones, UA: NEGATIVE
Leukocytes,UA: NEGATIVE
Nitrite, UA: NEGATIVE
Protein,UA: NEGATIVE
RBC, UA: NEGATIVE
Specific Gravity, UA: 1.01 (ref 1.005–1.030)
Urobilinogen, Ur: 0.2 mg/dL (ref 0.2–1.0)
pH, UA: 7 (ref 5.0–7.5)

## 2024-07-08 LAB — BLADDER SCAN AMB NON-IMAGING: PVR: 2 WU

## 2024-07-11 LAB — COLOGUARD: COLOGUARD: NEGATIVE

## 2024-07-17 ENCOUNTER — Other Ambulatory Visit: Payer: Self-pay

## 2024-07-20 ENCOUNTER — Other Ambulatory Visit: Payer: Self-pay

## 2024-07-20 ENCOUNTER — Other Ambulatory Visit (HOSPITAL_COMMUNITY): Payer: Self-pay

## 2024-07-28 ENCOUNTER — Other Ambulatory Visit (HOSPITAL_COMMUNITY): Payer: Self-pay

## 2024-08-07 ENCOUNTER — Other Ambulatory Visit: Payer: Self-pay

## 2025-02-24 ENCOUNTER — Encounter (INDEPENDENT_AMBULATORY_CARE_PROVIDER_SITE_OTHER): Admitting: Ophthalmology

## 2025-06-30 ENCOUNTER — Encounter: Admitting: Family Medicine
